# Patient Record
Sex: Male | Born: 1935 | Race: Black or African American | Hispanic: No | Marital: Married | Smoking: Former smoker
Health system: Southern US, Community
[De-identification: ages and names within clinical notes are randomized; demographics above are authoritative.]

## PROBLEM LIST (undated history)

## (undated) DIAGNOSIS — K4020 Bilateral inguinal hernia, without obstruction or gangrene, not specified as recurrent: Secondary | ICD-10-CM

## (undated) DIAGNOSIS — E538 Deficiency of other specified B group vitamins: Secondary | ICD-10-CM

## (undated) DIAGNOSIS — E785 Hyperlipidemia, unspecified: Secondary | ICD-10-CM

## (undated) DIAGNOSIS — I10 Essential (primary) hypertension: Secondary | ICD-10-CM

## (undated) DIAGNOSIS — I639 Cerebral infarction, unspecified: Secondary | ICD-10-CM

## (undated) DIAGNOSIS — R4189 Other symptoms and signs involving cognitive functions and awareness: Secondary | ICD-10-CM

## (undated) DIAGNOSIS — N189 Chronic kidney disease, unspecified: Secondary | ICD-10-CM

## (undated) DIAGNOSIS — E119 Type 2 diabetes mellitus without complications: Secondary | ICD-10-CM

## (undated) DIAGNOSIS — I739 Peripheral vascular disease, unspecified: Secondary | ICD-10-CM

## (undated) HISTORY — DX: Cerebral infarction, unspecified: I63.9

## (undated) HISTORY — DX: Bilateral inguinal hernia, without obstruction or gangrene, not specified as recurrent: K40.20

## (undated) HISTORY — DX: Peripheral vascular disease, unspecified: I73.9

## (undated) HISTORY — DX: Hyperlipidemia, unspecified: E78.5

## (undated) HISTORY — DX: Deficiency of other specified B group vitamins: E53.8

## (undated) HISTORY — DX: Type 2 diabetes mellitus without complications: E11.9

## (undated) HISTORY — DX: Chronic kidney disease, unspecified: N18.9

## (undated) HISTORY — DX: Other symptoms and signs involving cognitive functions and awareness: R41.89

---

## 2001-05-07 ENCOUNTER — Encounter (INDEPENDENT_AMBULATORY_CARE_PROVIDER_SITE_OTHER): Payer: Self-pay | Admitting: *Deleted

## 2001-05-07 ENCOUNTER — Ambulatory Visit (HOSPITAL_COMMUNITY): Admission: RE | Admit: 2001-05-07 | Discharge: 2001-05-07 | Payer: Self-pay | Admitting: Gastroenterology

## 2001-07-02 ENCOUNTER — Ambulatory Visit (HOSPITAL_COMMUNITY): Admission: RE | Admit: 2001-07-02 | Discharge: 2001-07-02 | Payer: Self-pay | Admitting: Gastroenterology

## 2001-07-02 ENCOUNTER — Encounter (INDEPENDENT_AMBULATORY_CARE_PROVIDER_SITE_OTHER): Payer: Self-pay | Admitting: *Deleted

## 2010-07-24 ENCOUNTER — Encounter: Admission: RE | Admit: 2010-07-24 | Discharge: 2010-07-24 | Payer: Self-pay | Admitting: Family Medicine

## 2011-02-09 NOTE — Procedures (Signed)
[place]. [facility]  Patient:    Shane Juarez, Shane Juarez                     MRN: [id number] Proc. Date: 05/07/01 Adm. Date:  [id number] Attending:  Nelda Marseille CC:         Shane Juarez, M.D.   Procedure Report  PROCEDURE:  Colonoscopy.  INDICATION:   Screening.  Consent was signed after risks, benefits, methods and options were thoroughly discussed with Mr. and [name] multiple times in the office.  MEDICATIONS:  Demerol 70, Versed 7.  PROCEDURE:  Rectal inspection was pertinent for external hemorrhoids.  Digital examination was negative.  Video colonoscope was inserted and fairly easily advanced around the colon to the cecum.  On insertion, at 40 cm, a medium-sized pedunculated polyp was seen.  Based on its size and location, we thought we could easily find it on withdrawal.  To advance to the cecum required rolling him on his back and abdominal pressure.  The cecum was identified by the appendiceal orifice and the ileocecal valve.  The prep was adequate.  There was some liquid stool that required washing and suctioning. No other abnormalities were seen on insertion.  The scope was slowly withdrawn.  The cecum and the ascending were normal.  In the mid transverse, a small 5 mm polyp was seen, snared, electrocautery applied and the polyp was suctioned through the scope and collected in the trap.  Just distal to this was a 2 cm sessile polyp.  Four snares were done taking less than 1 cm pieces making sure to note how much wire was in the polyp based on the measurements on the handle.  All pieces were suctioned through the scope and collected in the trap, except for the largest one, which could not be suctioned and was suctioned onto the head of the scope and the piece was recovered.  The scope was reinserted into the polypectomy site, which did require some abdominal pressure.  There was no active bleeding.  There seemed to be a few places  of residual probable adenomatous tissue and a few hot biopsies of the edges were obtained.  There was still probably some residual adenomatous remaining, but based on the degree of cautery applied, we elected to stop further polypectomy at this juncture.  The scope was further withdrawn in the more proximal descending another 5 mm polyp was seen, snared, electrocautery applied, and the polyp was suctioned through the scope and collected in the trap and put in the second container.  The two transverse polyps were put in the same container based on their close proximity.  The scope was slowly withdrawn to the sigmoid/descending junction about 40 cm and the polyp seen on insertion was seen, snare, electrocautery applied, and the polyp was suctioned through the scope and collected in the trap.  This was added to the other descending polyp mentioned above.  The scope was further withdrawn back to the rectum and no additional findings were seen.   Once back in the rectum, the scope was retroflexed, pertinent for some internal hemorrhoids.  Scope was straightened, readvanced a short ways up the sigmoid, air was suctioned, scope removed.  The patient tolerated the procedure well.  There was no obvious immediate complication.  ENDOSCOPIC DIAGNOSES: 1. Internal and external hemorrhoids. 2. Four small to medium-sized polyps, status post removal with the largest    being in the mid-transverse requiring multiple snares and hot  biopsies.    Probably some residual polyp remaining. 3. Otherwise within normal limits to the cecum.  PLAN:  Await pathology, but probable recheck in one year.  If any question about pathology, would check sooner.  Two week customary postpolypectomy instructions.  Will stress those and happy to see back sooner p.r.n. Otherwise return care to Dr. Clarene Duke for customary health care maintenance to including yearly rectals and guaiacs. DD:  05/07/01 TD:  05/07/01 Job:  [id number]

## 2011-02-09 NOTE — Op Note (Signed)
[place]. [facility]  Patient:    Shane Juarez, Shane Juarez Visit Number: [id number] MRN: [id number]          Service Type: END Location: ENDO Attending Physician:  Nelda Marseille Dictated by:   Petra Kuba, M.D. Proc. Date: 07/02/01 Admit Date:  07/02/2001   CC:         Caryn Bee L. Little, M.D.   Operative Report  PROCEDURE:  Colonoscopy, polypectomy.  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  Unsure if completely removed.  Want to repeat procedure to proceed with complete polypectomy if able.  PROCEDURAL NOTE:  Consent was signed after risks, benefits, methods, and options thoroughly discussed with both the patient and his wife in my office.  MEDICINES USED:  Demerol 50, Versed 7.5.  DESCRIPTION OF PROCEDURE:   Rectal inspection performed for external hemorrhoids.  Digital exam was negative.  Video colonoscope was inserted, moderately easily advanced to the cecum.  No obvious abnormality was seen on insertion.  Two inserts to the cecum required.  Rolling him on his back and some abdominal pressure.  Cecum was identified by the appendiceal orifice and ileocecal valve.  The scope was slowly withdrawn.  Prep was adequate.  There was a fair amount of bubbles which required washing with Mylicon wash.  Cecum and ascending were normal.  In the mid transverse in roughly the same position where we had seen the polyp before was probable residual polyp.  It was about 1-1.5 cm but very sessile along a fold.  We went ahead and took five small snare pieces making sure that not too much tissue behind the wall was caught in the snare.  All pieces that were removed were suctioned through the scope and collected in the trap.  We then went ahead and took four biopsies of the edge of the polyp, four hot biopsies of the edge of the polyp.  No obvious residual adenomatous tissue was seen.  Photo documentation was obtained. There was  no active bleeding.  The scope was slowly withdrawn.  No other abnormalities were seen as we slowly withdrew back to the rectum.  Once back in the rectum, the scope was retroflexed ______ for some internal hemorrhoids, small.  The scope was drained.  ______ .  Scope removed.  The patient tolerated the procedure well.  There were no obvious immediate complication.  ENDOSCOPIC DIAGNOSES: 1. Small internal-external hemorrhoids. 2. Mid transverse residual polyp status post multiple snares and hot biopsies. 3. Otherwise within normal limits to the cecum.  PLAN:  Await pathology to determine when to repeat colonoscopy.  Ten-day customary post polypectomy instructions.  Call me sooner p.r.n.  Will decide follow-up based on pathology to see if we need to talk about surgical options or repeating colonoscopy with further work-up and plans. Dictated by:   Petra Kuba, M.D. Attending Physician:  Nelda Marseille DD:  07/02/01 TD:  07/02/01 Job: [phone]

## 2012-01-06 ENCOUNTER — Inpatient Hospital Stay (HOSPITAL_COMMUNITY)
Admission: EM | Admit: 2012-01-06 | Discharge: 2012-01-09 | DRG: 064 | Disposition: A | Discharge status: Rehabilitation Facility | Payer: Medicare Other | Attending: Neurology | Admitting: Neurology

## 2012-01-06 ENCOUNTER — Encounter (HOSPITAL_COMMUNITY): Payer: Self-pay | Admitting: *Deleted

## 2012-01-06 ENCOUNTER — Emergency Department (HOSPITAL_COMMUNITY): Payer: Medicare Other

## 2012-01-06 DIAGNOSIS — I10 Essential (primary) hypertension: Secondary | ICD-10-CM | POA: Diagnosis present

## 2012-01-06 DIAGNOSIS — G8190 Hemiplegia, unspecified affecting unspecified side: Secondary | ICD-10-CM | POA: Diagnosis present

## 2012-01-06 DIAGNOSIS — J9819 Other pulmonary collapse: Secondary | ICD-10-CM | POA: Diagnosis present

## 2012-01-06 DIAGNOSIS — I639 Cerebral infarction, unspecified: Secondary | ICD-10-CM | POA: Diagnosis present

## 2012-01-06 DIAGNOSIS — Z7982 Long term (current) use of aspirin: Secondary | ICD-10-CM

## 2012-01-06 DIAGNOSIS — R1310 Dysphagia, unspecified: Secondary | ICD-10-CM | POA: Diagnosis present

## 2012-01-06 DIAGNOSIS — R51 Headache: Secondary | ICD-10-CM | POA: Diagnosis present

## 2012-01-06 DIAGNOSIS — Z6826 Body mass index (BMI) 26.0-26.9, adult: Secondary | ICD-10-CM

## 2012-01-06 DIAGNOSIS — I63219 Cerebral infarction due to unspecified occlusion or stenosis of unspecified vertebral arteries: Principal | ICD-10-CM | POA: Diagnosis present

## 2012-01-06 DIAGNOSIS — E871 Hypo-osmolality and hyponatremia: Secondary | ICD-10-CM | POA: Diagnosis present

## 2012-01-06 DIAGNOSIS — R7309 Other abnormal glucose: Secondary | ICD-10-CM | POA: Diagnosis present

## 2012-01-06 DIAGNOSIS — R471 Dysarthria and anarthria: Secondary | ICD-10-CM | POA: Diagnosis present

## 2012-01-06 DIAGNOSIS — R279 Unspecified lack of coordination: Secondary | ICD-10-CM | POA: Diagnosis present

## 2012-01-06 DIAGNOSIS — G936 Cerebral edema: Secondary | ICD-10-CM | POA: Diagnosis present

## 2012-01-06 HISTORY — DX: Essential (primary) hypertension: I10

## 2012-01-06 LAB — COMPREHENSIVE METABOLIC PANEL
ALT: 13 U/L (ref 0–53)
AST: 25 U/L (ref 0–37)
Albumin: 3.6 g/dL (ref 3.5–5.2)
Alkaline Phosphatase: 89 U/L (ref 39–117)
BUN: 14 mg/dL (ref 6–23)
CO2: 25 mEq/L (ref 19–32)
Calcium: 9.1 mg/dL (ref 8.4–10.5)
Chloride: 99 mEq/L (ref 96–112)
Creatinine, Ser: 1.18 mg/dL (ref 0.50–1.35)
GFR calc Af Amer: 65 mL/min — ABNORMAL LOW (ref >90)
GFR calc non Af Amer: 56 mL/min — ABNORMAL LOW (ref >90)
Glucose, Bld: 158 mg/dL — ABNORMAL HIGH (ref 70–99)
Potassium: 4.3 mEq/L (ref 3.5–5.1)
Sodium: 133 mEq/L — ABNORMAL LOW (ref 135–145)
Total Bilirubin: 0.6 mg/dL (ref 0.3–1.2)
Total Protein: 7.5 g/dL (ref 6.0–8.3)

## 2012-01-06 LAB — URINALYSIS, ROUTINE W REFLEX MICROSCOPIC
Bilirubin Urine: NEGATIVE
Glucose, UA: 100 mg/dL — AB
Hgb urine dipstick: NEGATIVE
Ketones, ur: 15 mg/dL — AB
Leukocytes, UA: NEGATIVE
Nitrite: NEGATIVE
Protein, ur: NEGATIVE mg/dL
Specific Gravity, Urine: 1.022 (ref 1.005–1.030)
Urobilinogen, UA: 0.2 mg/dL (ref 0.0–1.0)
pH: 6 (ref 5.0–8.0)

## 2012-01-06 LAB — DIFFERENTIAL
Basophils Absolute: 0 10*3/uL (ref 0.0–0.1)
Basophils Relative: 0 % (ref 0–1)
Eosinophils Absolute: 0 10*3/uL (ref 0.0–0.7)
Eosinophils Relative: 0 % (ref 0–5)
Lymphocytes Relative: 20 % (ref 12–46)
Lymphs Abs: 1.8 10*3/uL (ref 0.7–4.0)
Monocytes Absolute: 0.8 10*3/uL (ref 0.1–1.0)
Monocytes Relative: 9 % (ref 3–12)
Neutro Abs: 6.5 10*3/uL (ref 1.7–7.7)
Neutrophils Relative %: 71 % (ref 43–77)

## 2012-01-06 LAB — CBC
HCT: 39.5 % (ref 39.0–52.0)
Hemoglobin: 13.9 g/dL (ref 13.0–17.0)
MCH: 33.2 pg (ref 26.0–34.0)
MCHC: 35.2 g/dL (ref 30.0–36.0)
MCV: 94.3 fL (ref 78.0–100.0)
Platelets: 232 10*3/uL (ref 150–400)
RBC: 4.19 MIL/uL — ABNORMAL LOW (ref 4.22–5.81)
RDW: 13 % (ref 11.5–15.5)
WBC: 9.1 10*3/uL (ref 4.0–10.5)

## 2012-01-06 LAB — GLUCOSE, CAPILLARY: Glucose-Capillary: 129 mg/dL — ABNORMAL HIGH (ref 70–99)

## 2012-01-06 LAB — PROTIME-INR
INR: 0.98 (ref 0.00–1.49)
Prothrombin Time: 13.2 seconds (ref 11.6–15.2)

## 2012-01-06 LAB — MRSA PCR SCREENING: MRSA by PCR: NEGATIVE

## 2012-01-06 LAB — APTT: aPTT: 26 seconds (ref 24–37)

## 2012-01-06 MED ORDER — SODIUM CHLORIDE 0.9 % IV SOLN: INTRAVENOUS | Status: DC

## 2012-01-06 MED ORDER — ONDANSETRON HCL 4 MG/2ML IJ SOLN: 4.0000 mg | Freq: Four times a day (QID) | INTRAMUSCULAR | Status: DC | PRN

## 2012-01-06 MED ORDER — SODIUM CHLORIDE 0.9 % IV SOLN
INTRAVENOUS | Status: DC
  Administered 2012-01-06: 13:00:00 via INTRAVENOUS

## 2012-01-06 MED ORDER — SODIUM CHLORIDE 0.9 % IV SOLN
INTRAVENOUS | Status: DC
  Administered 2012-01-06: 20:00:00 via INTRAVENOUS

## 2012-01-06 MED ORDER — ASPIRIN 325 MG PO TABS
325.0000 mg | ORAL_TABLET | Freq: Every day | ORAL | Status: DC
  Administered 2012-01-06 – 2012-01-09 (×3): 325 mg via ORAL
  Filled 2012-01-06 (×4): qty 1

## 2012-01-06 MED ORDER — ASPIRIN 81 MG PO CHEW
324.0000 mg | CHEWABLE_TABLET | Freq: Once | ORAL | Status: AC
  Administered 2012-01-06: 324 mg via ORAL
  Filled 2012-01-06: qty 4

## 2012-01-06 MED ORDER — ASPIRIN 300 MG RE SUPP
300.0000 mg | Freq: Every day | RECTAL | Status: DC
  Administered 2012-01-07: 300 mg via RECTAL
  Filled 2012-01-06 (×3): qty 1

## 2012-01-06 MED ORDER — SENNOSIDES-DOCUSATE SODIUM 8.6-50 MG PO TABS
1.0000 | ORAL_TABLET | Freq: Every evening | ORAL | Status: DC | PRN
  Filled 2012-01-06: qty 1

## 2012-01-06 MED ORDER — LABETALOL HCL 5 MG/ML IV SOLN
20.0000 mg | INTRAVENOUS | Status: DC | PRN
  Administered 2012-01-08: 20 mg via INTRAVENOUS
  Filled 2012-01-06 (×2): qty 4

## 2012-01-06 MED ORDER — PANTOPRAZOLE SODIUM 40 MG IV SOLR
40.0000 mg | Freq: Every day | INTRAVENOUS | Status: DC
  Administered 2012-01-06: 40 mg via INTRAVENOUS
  Filled 2012-01-06 (×2): qty 40

## 2012-01-06 NOTE — Consult Note (Signed)
Name: Shane Juarez MRN: [id number] DOB: 11-16-1929    LOS: 0  PCCM ADMISSION NOTE  History of Present Illness: This is an 78 year old male, with no significant past medical history, other than colon polyps, s/p colonoscopies/extraction 04/2001 and 06/2001, HTN, presenting as described above. History is obtained from patient's spouse, who was present in the ED. Acute onset 3 days prior of falling. Pt now with ataxia. 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  Due to size of CVA pt will need ICU/SDU bed.    Lines / Drains: none  Cultures: none  Antibiotics: none  Tests / Events:     Past Medical History  Diagnosis Date  . Hypertension    History reviewed. No pertinent past surgical history. Prior to Admission medications   Medication Sig Start Date End Date Taking? Authorizing Provider  amLODipine (NORVASC) 5 MG tablet Take 5 mg by mouth daily.   Yes Historical Provider, MD  Probiotic Product (PROBIOTIC FORMULA PO) Take 1 tablet by mouth daily.   Yes Historical Provider, MD   Allergies No Known Allergies  Family History No family history on file.  Social History  reports that he has never smoked. He does not have any smokeless tobacco history on file. He reports that he does not drink alcohol or use illicit drugs.  Review Of Systems  11 points review of systems is negative with an exception of listed in HPI.  Vital Signs: Temp:  [98.2 F (36.8 C)-98.6 F (37 C)] 98.2 F (36.8 C) (04/14 1619) Pulse Rate:  [84-102] 102  (04/14 1615) Resp:  [18-100] 100  (04/14 1548) BP: (139-169)/(68-84) 169/84 mmHg (04/14 1615) SpO2:  [95 %-100 %] 100 % (04/14 1615)    Physical Examination: General:  Awake alert Neuro:  Moves all 4s.  ataxic   HEENT:  Dry mucus membranes Neck:  Supple    Cardiovascular:  RRR nl s1/s2  No s3/s4 Lungs:  clear Abdomen:  Soft NT BSA Musculoskeletal:  From  Skin:  clear  Ventilator settings: On RA    Labs and Imaging:  Reviewed.   Please refer to the Assessment and Plan section for relevant results. Dg Chest 2 View  01/06/2012  *RADIOLOGY REPORT*  Clinical Data: Fall with weakness.  CHEST - 2 VIEW  Comparison: 07/24/2010 CT  Findings: This is a low volume film with mild bibasilar atelectasis. There is no evidence of focal airspace disease, pulmonary edema, suspicious pulmonary nodule/mass, pleural effusion, or pneumothorax. No acute bony abnormalities are identified.  IMPRESSION: Low volume film with mild basilar atelectasis.  Original Report Authenticated By: Rosendo Gros, M.D.   Ct Head Wo Contrast  01/06/2012  *RADIOLOGY REPORT*  Clinical Data:  Fall, unsteady gait  CT HEAD WITHOUT CONTRAST CT CERVICAL SPINE WITHOUT CONTRAST  Technique:  Multidetector CT imaging of the head and cervical spine was performed following the standard protocol without intravenous contrast.  Multiplanar CT image reconstructions of the cervical spine were also generated.  Comparison: None  CT HEAD  Findings: There is a large region of hypoattenuation within the right cerebellum most consistent with infarction.  There is some mass effect with compression of the fourth ventricle suggesting edema. There is mild left or right midline shift the posterior fossa.  Quadrigeminal plate cistern is patent as well as the suprasellar cistern.  There is mild dilatation of the temporal horns.  No evidence of supra tentorial infarction.  There is periventricular white matter hypodensities.  No acute intracranial hemorrhage.  IMPRESSION:  1. Findings most consistent with large right cerebellar infarction which is acute / subacute.  2.  Mass effect in the posterior fossa with compression of the fourth ventricle and midline shift within the posterior fossa.  CT CERVICAL SPINE  Findings: No prevertebral soft tissue swelling.  Normal alignment of cervical vertebral bodies.  No loss of vertebral body height. Normal facet articulation.  Normal craniocervical junction. There is  endplate spurring at multiple levels and loss of disc space at C5-C6.  No evidence epidural or paraspinal hematoma.  Incidental note of a 15 mm right thyroid nodule.  IMPRESSION: 1.  No evidence of cervical spine fracture. 2.  Multilevel spondylosis. 3.  Thyroid nodule on the right. Consider further evaluation with thyroid ultrasound.  If patient is clinically hyperthyroid, consider nuclear medicine thyroid uptake and scan.  Findings conveyed to Dr. Rubin Payor on 01/06/2012 at 1220 hours  Original Report Authenticated By: Genevive Bi, M.D.   Ct Cervical Spine Wo Contrast  01/06/2012  *RADIOLOGY REPORT*  Clinical Data:  Fall, unsteady gait  CT HEAD WITHOUT CONTRAST CT CERVICAL SPINE WITHOUT CONTRAST  Technique:  Multidetector CT imaging of the head and cervical spine was performed following the standard protocol without intravenous contrast.  Multiplanar CT image reconstructions of the cervical spine were also generated.  Comparison: None  CT HEAD  Findings: There is a large region of hypoattenuation within the right cerebellum most consistent with infarction.  There is some mass effect with compression of the fourth ventricle suggesting edema. There is mild left or right midline shift the posterior fossa.  Quadrigeminal plate cistern is patent as well as the suprasellar cistern.  There is mild dilatation of the temporal horns.  No evidence of supra tentorial infarction.  There is periventricular white matter hypodensities.  No acute intracranial hemorrhage.  IMPRESSION:  1. Findings most consistent with large right cerebellar infarction which is acute / subacute.  2.  Mass effect in the posterior fossa with compression of the fourth ventricle and midline shift within the posterior fossa.  CT CERVICAL SPINE  Findings: No prevertebral soft tissue swelling.  Normal alignment of cervical vertebral bodies.  No loss of vertebral body height. Normal facet articulation.  Normal craniocervical junction. There is  endplate spurring at multiple levels and loss of disc space at C5-C6.  No evidence epidural or paraspinal hematoma.  Incidental note of a 15 mm right thyroid nodule.  IMPRESSION: 1.  No evidence of cervical spine fracture. 2.  Multilevel spondylosis. 3.  Thyroid nodule on the right. Consider further evaluation with thyroid ultrasound.  If patient is clinically hyperthyroid, consider nuclear medicine thyroid uptake and scan.  Findings conveyed to Dr. Rubin Payor on 01/06/2012 at 1220 hours  Original Report Authenticated By: Genevive Bi, M.D.    Lab 01/06/12 1215  NA 133*  K 4.3  CL 99  CO2 25  GLUCOSE 158*  BUN 14  CREATININE 1.18  CALCIUM 9.1  MG --  PHOS --    Lab 01/06/12 1215  HGB 13.9  HCT 39.5  WBC 9.1  PLT 232    Lab 01/06/12 1529 01/06/12 1215  AST -- 25  ALT -- 13  ALKPHOS -- 89  BILITOT -- 0.6  PROT -- 7.5  ALBUMIN -- 3.6  INR 0.98 --    Assessment and Plan: Patient Active Hospital Problem List: CVA (cerebral vascular accident) (01/06/2012)   Assessment: cerebellar CVA   Plan: admit ICU to PCCM, see stroke orders, frequent neuro checks , high risk  for 4th ventric obstruction and herniation , needs close observation  HTN (hypertension) (01/06/2012)   Assessment: monitor and treat ,     Plan: Keep SBP <170    Best practices / Disposition: -->ICU status under PCCM -->full code -->SCD  for DVT Px -->Protonix for GI Px -->diet npo -->family updated at bedside  The patient is critically ill with multiple organ systems failure and requires high complexity decision making for assessment and support, frequent evaluation and titration of therapies, application of advanced monitoring technologies and extensive interpretation of multiple databases. Critical Care Time devoted to patient care services described in this note is 35 minutes.  Shan Levans  M.D. Pulmonary and Critical Care Medicine [facility] Cell: [phone]   Pager:  [phone] 01/06/2012, 5:49 PM

## 2012-01-06 NOTE — ED Notes (Signed)
Pt and wife revealed to the admitting doctor that he has been eating food from home today without informing nursing staff and wanted to continue to eat food. This RN talked with Dr Brien Few about pt having to stay npo until speech pathology study completed.

## 2012-01-06 NOTE — ED Notes (Signed)
Dr Delford Field is here to assess the pt for admission to 3100.

## 2012-01-06 NOTE — ED Notes (Signed)
Pt requested and was provided a urinal. 

## 2012-01-06 NOTE — Consult Note (Signed)
Chief Complaint: "cannot walk"  HPI: Shane Juarez is an 78 y.o. male who was unable to walk since Wednesday. Denies headaches or nausea. Also had difficulty with using his right arm. NIHSS of 2.  LSN: Wednesday tPA Given: No: out of window mRankin: 0  Past Medical History  Diagnosis Date  . Hypertension    History reviewed. No pertinent past surgical history.  No family history on file. Social History:  reports that he has never smoked. He does not have any smokeless tobacco history on file. He reports that he does not drink alcohol or use illicit drugs.  Allergies: No Known Allergies  Medications: I have reviewed the patient's current medications.  ROS: as above  Physical Examination: Blood pressure 164/73, pulse 84, temperature 98.4 F (36.9 C), temperature source Oral, resp. rate 18, SpO2 100.00%.  Neurologic Examination: Neurological exam: AAO*3. No aphasia. Followed complex commands. Cranial nerves: EOMI, PERRL. Visual fields were full. Sensation to V1 through V3 areas of the face was intact and symmetric throughout. There was no facial asymmetry. Hearing to finger rub was equal and symmetrical bilaterally. Shoulder shrug was 5/5 and symmetric bilaterally. Head rotation was 5/5 bilaterally. There was no dysarthria or palatal deviation. Motor: strength was 5/5 and symmetric throughout. Sensory: was intact throughout to light touch, pinprick, vibration and proprioception. Coordination: finger-to-nose and heel-to-shin were abnormal on the right. Reflexes: were 1+ in upper extremities and 0 at the knees and 0 at the ankles. Plantar response was mute bilaterally. Gait: deferred.  Results for orders placed during the hospital encounter of 01/06/12 (from the past 48 hour(s))  CBC     Status: Abnormal   Collection Time   01/06/12 12:15 PM      Component Value Range Comment   WBC 9.1  4.0 - 10.5 (K/uL)    RBC 4.19 (*) 4.22 - 5.81 (MIL/uL)    Hemoglobin 13.9  13.0 - 17.0 (g/dL)    HCT 16.1  09.6 - 04.5 (%)    MCV 94.3  78.0 - 100.0 (fL)    MCH 33.2  26.0 - 34.0 (pg)    MCHC 35.2  30.0 - 36.0 (g/dL)    RDW 40.9  81.1 - 91.4 (%)    Platelets 232  150 - 400 (K/uL)   DIFFERENTIAL     Status: Normal   Collection Time   01/06/12 12:15 PM      Component Value Range Comment   Neutrophils Relative 71  43 - 77 (%)    Neutro Abs 6.5  1.7 - 7.7 (K/uL)    Lymphocytes Relative 20  12 - 46 (%)    Lymphs Abs 1.8  0.7 - 4.0 (K/uL)    Monocytes Relative 9  3 - 12 (%)    Monocytes Absolute 0.8  0.1 - 1.0 (K/uL)    Eosinophils Relative 0  0 - 5 (%)    Eosinophils Absolute 0.0  0.0 - 0.7 (K/uL)    Basophils Relative 0  0 - 1 (%)    Basophils Absolute 0.0  0.0 - 0.1 (K/uL)   COMPREHENSIVE METABOLIC PANEL     Status: Abnormal   Collection Time   01/06/12 12:15 PM      Component Value Range Comment   Sodium 133 (*) 135 - 145 (mEq/L)    Potassium 4.3  3.5 - 5.1 (mEq/L)    Chloride 99  96 - 112 (mEq/L)    CO2 25  19 - 32 (mEq/L)    Glucose, Bld 158 (*)  70 - 99 (mg/dL)    BUN 14  6 - 23 (mg/dL)    Creatinine, Ser 1.61  0.50 - 1.35 (mg/dL)    Calcium 9.1  8.4 - 10.5 (mg/dL)    Total Protein 7.5  6.0 - 8.3 (g/dL)    Albumin 3.6  3.5 - 5.2 (g/dL)    AST 25  0 - 37 (U/L) HEMOLYZED SPECIMEN, RESULTS MAY BE AFFECTED   ALT 13  0 - 53 (U/L)    Alkaline Phosphatase 89  39 - 117 (U/L)    Total Bilirubin 0.6  0.3 - 1.2 (mg/dL)    GFR calc non Af Amer 56 (*) >90 (mL/min)    GFR calc Af Amer 65 (*) >90 (mL/min)   URINALYSIS, ROUTINE W REFLEX MICROSCOPIC     Status: Abnormal   Collection Time   01/06/12  1:35 PM      Component Value Range Comment   Color, Urine YELLOW  YELLOW     APPearance CLEAR  CLEAR     Specific Gravity, Urine 1.022  1.005 - 1.030     pH 6.0  5.0 - 8.0     Glucose, UA 100 (*) NEGATIVE (mg/dL)    Hgb urine dipstick NEGATIVE  NEGATIVE     Bilirubin Urine NEGATIVE  NEGATIVE     Ketones, ur 15 (*) NEGATIVE (mg/dL)    Protein, ur NEGATIVE  NEGATIVE (mg/dL)     Urobilinogen, UA 0.2  0.0 - 1.0 (mg/dL)    Nitrite NEGATIVE  NEGATIVE     Leukocytes, UA NEGATIVE  NEGATIVE  MICROSCOPIC NOT DONE ON URINES WITH NEGATIVE PROTEIN, BLOOD, LEUKOCYTES, NITRITE, OR GLUCOSE <1000 mg/dL.   Dg Chest 2 View  01/06/2012  *RADIOLOGY REPORT*  Clinical Data: Fall with weakness.  CHEST - 2 VIEW  Comparison: 07/24/2010 CT  Findings: This is a low volume film with mild bibasilar atelectasis. There is no evidence of focal airspace disease, pulmonary edema, suspicious pulmonary nodule/mass, pleural effusion, or pneumothorax. No acute bony abnormalities are identified.  IMPRESSION: Low volume film with mild basilar atelectasis.  Original Report Authenticated By: Rosendo Gros, M.D.   Ct Head Wo Contrast  01/06/2012  *RADIOLOGY REPORT*  Clinical Data:  Fall, unsteady gait  CT HEAD WITHOUT CONTRAST CT CERVICAL SPINE WITHOUT CONTRAST  Technique:  Multidetector CT imaging of the head and cervical spine was performed following the standard protocol without intravenous contrast.  Multiplanar CT image reconstructions of the cervical spine were also generated.  Comparison: None  CT HEAD  Findings: There is a large region of hypoattenuation within the right cerebellum most consistent with infarction.  There is some mass effect with compression of the fourth ventricle suggesting edema. There is mild left or right midline shift the posterior fossa.  Quadrigeminal plate cistern is patent as well as the suprasellar cistern.  There is mild dilatation of the temporal horns.  No evidence of supra tentorial infarction.  There is periventricular white matter hypodensities.  No acute intracranial hemorrhage.  IMPRESSION:  1. Findings most consistent with large right cerebellar infarction which is acute / subacute.  2.  Mass effect in the posterior fossa with compression of the fourth ventricle and midline shift within the posterior fossa.  CT CERVICAL SPINE  Findings: No prevertebral soft tissue swelling.   Normal alignment of cervical vertebral bodies.  No loss of vertebral body height. Normal facet articulation.  Normal craniocervical junction. There is endplate spurring at multiple levels and loss of disc space at C5-C6.  No evidence epidural or paraspinal hematoma.  Incidental note of a 15 mm right thyroid nodule.  IMPRESSION: 1.  No evidence of cervical spine fracture. 2.  Multilevel spondylosis. 3.  Thyroid nodule on the right. Consider further evaluation with thyroid ultrasound.  If patient is clinically hyperthyroid, consider nuclear medicine thyroid uptake and scan.  Findings conveyed to Dr. Rubin Payor on 01/06/2012 at 1220 hours  Original Report Authenticated By: Genevive Bi, M.D.   Ct Cervical Spine Wo Contrast  01/06/2012  *RADIOLOGY REPORT*  Clinical Data:  Fall, unsteady gait  CT HEAD WITHOUT CONTRAST CT CERVICAL SPINE WITHOUT CONTRAST  Technique:  Multidetector CT imaging of the head and cervical spine was performed following the standard protocol without intravenous contrast.  Multiplanar CT image reconstructions of the cervical spine were also generated.  Comparison: None  CT HEAD  Findings: There is a large region of hypoattenuation within the right cerebellum most consistent with infarction.  There is some mass effect with compression of the fourth ventricle suggesting edema. There is mild left or right midline shift the posterior fossa.  Quadrigeminal plate cistern is patent as well as the suprasellar cistern.  There is mild dilatation of the temporal horns.  No evidence of supra tentorial infarction.  There is periventricular white matter hypodensities.  No acute intracranial hemorrhage.  IMPRESSION:  1. Findings most consistent with large right cerebellar infarction which is acute / subacute.  2.  Mass effect in the posterior fossa with compression of the fourth ventricle and midline shift within the posterior fossa.  CT CERVICAL SPINE  Findings: No prevertebral soft tissue swelling.  Normal  alignment of cervical vertebral bodies.  No loss of vertebral body height. Normal facet articulation.  Normal craniocervical junction. There is endplate spurring at multiple levels and loss of disc space at C5-C6.  No evidence epidural or paraspinal hematoma.  Incidental note of a 15 mm right thyroid nodule.  IMPRESSION: 1.  No evidence of cervical spine fracture. 2.  Multilevel spondylosis. 3.  Thyroid nodule on the right. Consider further evaluation with thyroid ultrasound.  If patient is clinically hyperthyroid, consider nuclear medicine thyroid uptake and scan.  Findings conveyed to Dr. Rubin Payor on 01/06/2012 at 1220 hours  Original Report Authenticated By: Genevive Bi, M.D.   Assessment: 78 y.o. male with large right cerebellar stroke and inability to walk since Wednesday as well as difficulty with using his right arm  Stroke Risk Factors - hypertension  Plan: 1. HgbA1c, fasting lipid panel 2. MRI, MRA  of the brain without contrast 3. PT consult, OT consult, Speech consult 4. Echocardiogram 5. Carotid dopplers 6. Prophylactic therapy-Antiplatelet med: Aspirin - dose 325 mg PO daily 7. Risk factor modification 8. Cardiac monitoring 9. Neurochecks q4h - with any sign of new headache, nausea or confusion. Please get stat CT head and call us 10. Can control his blood pressure appropriatelly as it's been 4 days since his CVA  Kaven Cumbie 01/06/2012, 2:32 PM

## 2012-01-06 NOTE — ED Notes (Signed)
Pt has bed on 3000 but doesn't have orders. Talked to [facility], [place] about getting some temp orders.

## 2012-01-06 NOTE — ED Notes (Signed)
Charge Nurse on 3100 reported to me that Dr Brien Few must get permission from PCCM or elink to admit the pt to 3100. Dr Brien Few informed by phone.

## 2012-01-06 NOTE — ED Notes (Signed)
Marylene Land, RN on 3000 not available for report at present, is with another pt. Will call her back in 5 minutes.

## 2012-01-06 NOTE — ED Provider Notes (Signed)
History     CSN: [id number]  Arrival date & time 01/06/12  1055   First MD Initiated Contact with Patient 01/06/12 1130      Chief Complaint  Patient presents with  . Fall  . Weakness    (Consider location/radiation/quality/duration/timing/severity/associated sxs/prior treatment) HPI  Past Medical History  Diagnosis Date  . Hypertension     History reviewed. No pertinent past surgical history.  No family history on file.  History  Substance Use Topics  . Smoking status: Never Smoker   . Smokeless tobacco: Not on file  . Alcohol Use: No      Review of Systems  Allergies  Review of patient's allergies indicates no known allergies.  Home Medications   Current Outpatient Rx  Name Route Sig Dispense Refill  . AMLODIPINE BESYLATE 5 MG PO TABS Oral Take 5 mg by mouth daily.    Marland Kitchen PROBIOTIC FORMULA PO Oral Take 1 tablet by mouth daily.      BP 164/73  Pulse 84  Temp(Src) 98.4 F (36.9 C) (Oral)  Resp 18  SpO2 100%  Physical Exam  ED Course  Procedures (including critical care time)  Labs Reviewed  CBC - Abnormal; Notable for the following:    RBC 4.19 (*)    All other components within normal limits  COMPREHENSIVE METABOLIC PANEL - Abnormal; Notable for the following:    Sodium 133 (*)    Glucose, Bld 158 (*)    GFR calc non Af Amer 56 (*)    GFR calc Af Amer 65 (*)    All other components within normal limits  DIFFERENTIAL  URINALYSIS, ROUTINE W REFLEX MICROSCOPIC   Dg Chest 2 View  01/06/2012  *RADIOLOGY REPORT*  Clinical Data: Fall with weakness.  CHEST - 2 VIEW  Comparison: 07/24/2010 CT  Findings: This is a low volume film with mild bibasilar atelectasis. There is no evidence of focal airspace disease, pulmonary edema, suspicious pulmonary nodule/mass, pleural effusion, or pneumothorax. No acute bony abnormalities are identified.  IMPRESSION: Low volume film with mild basilar atelectasis.  Original Report Authenticated By: Rosendo Gros, M.D.    Ct Head Wo Contrast  01/06/2012  *RADIOLOGY REPORT*  Clinical Data:  Fall, unsteady gait  CT HEAD WITHOUT CONTRAST CT CERVICAL SPINE WITHOUT CONTRAST  Technique:  Multidetector CT imaging of the head and cervical spine was performed following the standard protocol without intravenous contrast.  Multiplanar CT image reconstructions of the cervical spine were also generated.  Comparison: None  CT HEAD  Findings: There is a large region of hypoattenuation within the right cerebellum most consistent with infarction.  There is some mass effect with compression of the fourth ventricle suggesting edema. There is mild left or right midline shift the posterior fossa.  Quadrigeminal plate cistern is patent as well as the suprasellar cistern.  There is mild dilatation of the temporal horns.  No evidence of supra tentorial infarction.  There is periventricular white matter hypodensities.  No acute intracranial hemorrhage.  IMPRESSION:  1. Findings most consistent with large right cerebellar infarction which is acute / subacute.  2.  Mass effect in the posterior fossa with compression of the fourth ventricle and midline shift within the posterior fossa.  CT CERVICAL SPINE  Findings: No prevertebral soft tissue swelling.  Normal alignment of cervical vertebral bodies.  No loss of vertebral body height. Normal facet articulation.  Normal craniocervical junction. There is endplate spurring at multiple levels and loss of disc space at C5-C6.  No  evidence epidural or paraspinal hematoma.  Incidental note of a 15 mm right thyroid nodule.  IMPRESSION: 1.  No evidence of cervical spine fracture. 2.  Multilevel spondylosis. 3.  Thyroid nodule on the right. Consider further evaluation with thyroid ultrasound.  If patient is clinically hyperthyroid, consider nuclear medicine thyroid uptake and scan.  Findings conveyed to Dr. Rubin Payor on 01/06/2012 at 1220 hours  Original Report Authenticated By: Genevive Bi, M.D.   Ct Cervical  Spine Wo Contrast  01/06/2012  *RADIOLOGY REPORT*  Clinical Data:  Fall, unsteady gait  CT HEAD WITHOUT CONTRAST CT CERVICAL SPINE WITHOUT CONTRAST  Technique:  Multidetector CT imaging of the head and cervical spine was performed following the standard protocol without intravenous contrast.  Multiplanar CT image reconstructions of the cervical spine were also generated.  Comparison: None  CT HEAD  Findings: There is a large region of hypoattenuation within the right cerebellum most consistent with infarction.  There is some mass effect with compression of the fourth ventricle suggesting edema. There is mild left or right midline shift the posterior fossa.  Quadrigeminal plate cistern is patent as well as the suprasellar cistern.  There is mild dilatation of the temporal horns.  No evidence of supra tentorial infarction.  There is periventricular white matter hypodensities.  No acute intracranial hemorrhage.  IMPRESSION:  1. Findings most consistent with large right cerebellar infarction which is acute / subacute.  2.  Mass effect in the posterior fossa with compression of the fourth ventricle and midline shift within the posterior fossa.  CT CERVICAL SPINE  Findings: No prevertebral soft tissue swelling.  Normal alignment of cervical vertebral bodies.  No loss of vertebral body height. Normal facet articulation.  Normal craniocervical junction. There is endplate spurring at multiple levels and loss of disc space at C5-C6.  No evidence epidural or paraspinal hematoma.  Incidental note of a 15 mm right thyroid nodule.  IMPRESSION: 1.  No evidence of cervical spine fracture. 2.  Multilevel spondylosis. 3.  Thyroid nodule on the right. Consider further evaluation with thyroid ultrasound.  If patient is clinically hyperthyroid, consider nuclear medicine thyroid uptake and scan.  Findings conveyed to Dr. Rubin Payor on 01/06/2012 at 1220 hours  Original Report Authenticated By: Genevive Bi, M.D.     1. Stroke     1:26 PM Patient seen and examined. Patient to CDU from stretcher triage, dx cerebellar stroke. Symptoms for 4 days. Neurology is to see patient. Dr. Lyman Speller called by Dr. Rubin Payor.  Vital signs reviewed and are as follows: Filed Vitals:   01/06/12 1321  BP: 164/73  Pulse: 84  Temp:   Resp: 18   2:21 PM Dr. Lyman Speller has seen patient. Recomends medical admit. Will call [facility].    3:03 PM [facility] Team 6.   MDM  Admit for cerebellar stroke       Renne Crigler, [place] 01/06/12 1504

## 2012-01-06 NOTE — ED Notes (Signed)
Dr Brien Few at bedside to see pt and talk with family

## 2012-01-06 NOTE — H&P (Signed)
PCP:   No primary provider on file.   Chief Complaint:  Difficulty in walking, unsteadiness and incoordination. Recent fall.  HPI: This is an 78 year old male, with no significant past medical history, other than colon polyps, s/p colonoscopies/extraction 04/2001 and 06/2001, HTN, presenting as described above. History is obtained from patient's spouse, who was present in the ED. According to her, he went to sleep on the tail gate of a stationary truck on 12/31/11, ad rolled off, falling to the ground. It appears that he suffered no obvious injuries, got up, and was fine. A day later, he developed nausea, vomiting and diarrhea, went to his PMD's office, and was treated for an acute G/Enteritis with an injection of Phenergan, and was prescribed Phenergan pills and a probiotic. He has had no GI symptoms since. Since 01/02/12, he has experienced difficulty with ambulation, associated with unsteadiness, unable to get up without assistance. "Objects appear to move", when he reaches out to steady himself. He has had no swallowing difficulties or slurred speech, and he admits that his limbs seem uncoordinated. As he showed no improvement over the next few days, his family brought him to the ED today.  Allergies:  No Known Allergies    Past Medical History  Diagnosis Date  . Hypertension     History reviewed. No pertinent past surgical history.  Prior to Admission medications   Medication Sig Start Date End Date Taking? Authorizing Provider  amLODipine (NORVASC) 5 MG tablet Take 5 mg by mouth daily.   Yes Historical Provider, MD  Probiotic Product (PROBIOTIC FORMULA PO) Take 1 tablet by mouth daily.   Yes Historical Provider, MD    Social History: Patient is married with offspring, reports that he has never smoked. He does not have any smokeless tobacco history on file. He reports that he does not drink alcohol or use illicit drugs.  No family history on file.  Review of Systems:  As per HPI and  chief complaint. Patent denies fatigue, diminished appetite, weight loss, fever, chills, headache, blurred vision, difficulty in speaking, dysphagia, chest pain, cough, shortness of breath, orthopnea, paroxysmal nocturnal dyspnea, nausea, abdominal pain, hematemesis, melena, dysuria, nocturia, urinary frequency, hematochezia, lower extremity swelling, pain, or redness. The rest of the systems review is negative.  Physical Exam:  General:  Patient does not appear to be in obvious acute distress. Alert, communicative, fully oriented, talking in complete sentences, not short of breath at rest.  HEENT:  No clinical pallor, no jaundice, no conjunctival injection or discharge. Hydration status appears fair. NECK:  Supple, JVP not seen, no carotid bruits, no palpable lymphadenopathy, no palpable goiter. CHEST:  Clinically clear to auscultation, no wheezes, no crackles. HEART:  Sounds 1 and 2 heard, normal, regular, no murmurs. ABDOMEN:  Full, soft, non-tender, no palpable organomegaly, no palpable masses, normal bowel sounds. GENITALIA:  Not examined. LOWER EXTREMITIES:  No pitting edema, palpable peripheral pulses. MUSCULOSKELETAL SYSTEM:  Generalized osteoarthritic changes, otherwise, normal. CENTRAL NERVOUS SYSTEM:  No facial asymmetry or obvious cranial nerve deficit. Alternating rapid motion is grossly impaired in right hand, as is Finger-To-Nose testing. Heel-Shin test is also impaired on right.  Labs on Admission:  Results for orders placed during the hospital encounter of 01/06/12 (from the past 48 hour(s))  CBC     Status: Abnormal   Collection Time   01/06/12 12:15 PM      Component Value Range Comment   WBC 9.1  4.0 - 10.5 (K/uL)    RBC 4.19 (*)  4.22 - 5.81 (MIL/uL)    Hemoglobin 13.9  13.0 - 17.0 (g/dL)    HCT 62.9  52.8 - 41.3 (%)    MCV 94.3  78.0 - 100.0 (fL)    MCH 33.2  26.0 - 34.0 (pg)    MCHC 35.2  30.0 - 36.0 (g/dL)    RDW 24.4  01.0 - 27.2 (%)    Platelets 232  150 - 400  (K/uL)   DIFFERENTIAL     Status: Normal   Collection Time   01/06/12 12:15 PM      Component Value Range Comment   Neutrophils Relative 71  43 - 77 (%)    Neutro Abs 6.5  1.7 - 7.7 (K/uL)    Lymphocytes Relative 20  12 - 46 (%)    Lymphs Abs 1.8  0.7 - 4.0 (K/uL)    Monocytes Relative 9  3 - 12 (%)    Monocytes Absolute 0.8  0.1 - 1.0 (K/uL)    Eosinophils Relative 0  0 - 5 (%)    Eosinophils Absolute 0.0  0.0 - 0.7 (K/uL)    Basophils Relative 0  0 - 1 (%)    Basophils Absolute 0.0  0.0 - 0.1 (K/uL)   COMPREHENSIVE METABOLIC PANEL     Status: Abnormal   Collection Time   01/06/12 12:15 PM      Component Value Range Comment   Sodium 133 (*) 135 - 145 (mEq/L)    Potassium 4.3  3.5 - 5.1 (mEq/L)    Chloride 99  96 - 112 (mEq/L)    CO2 25  19 - 32 (mEq/L)    Glucose, Bld 158 (*) 70 - 99 (mg/dL)    BUN 14  6 - 23 (mg/dL)    Creatinine, Ser 5.36  0.50 - 1.35 (mg/dL)    Calcium 9.1  8.4 - 10.5 (mg/dL)    Total Protein 7.5  6.0 - 8.3 (g/dL)    Albumin 3.6  3.5 - 5.2 (g/dL)    AST 25  0 - 37 (U/L) HEMOLYZED SPECIMEN, RESULTS MAY BE AFFECTED   ALT 13  0 - 53 (U/L)    Alkaline Phosphatase 89  39 - 117 (U/L)    Total Bilirubin 0.6  0.3 - 1.2 (mg/dL)    GFR calc non Af Amer 56 (*) >90 (mL/min)    GFR calc Af Amer 65 (*) >90 (mL/min)   URINALYSIS, ROUTINE W REFLEX MICROSCOPIC     Status: Abnormal   Collection Time   01/06/12  1:35 PM      Component Value Range Comment   Color, Urine YELLOW  YELLOW     APPearance CLEAR  CLEAR     Specific Gravity, Urine 1.022  1.005 - 1.030     pH 6.0  5.0 - 8.0     Glucose, UA 100 (*) NEGATIVE (mg/dL)    Hgb urine dipstick NEGATIVE  NEGATIVE     Bilirubin Urine NEGATIVE  NEGATIVE     Ketones, ur 15 (*) NEGATIVE (mg/dL)    Protein, ur NEGATIVE  NEGATIVE (mg/dL)    Urobilinogen, UA 0.2  0.0 - 1.0 (mg/dL)    Nitrite NEGATIVE  NEGATIVE     Leukocytes, UA NEGATIVE  NEGATIVE  MICROSCOPIC NOT DONE ON URINES WITH NEGATIVE PROTEIN, BLOOD, LEUKOCYTES,  NITRITE, OR GLUCOSE <1000 mg/dL.    Radiological Exams on Admission: *RADIOLOGY REPORT*  Clinical Data: Fall, unsteady gait  CT HEAD WITHOUT CONTRAST  CT CERVICAL SPINE WITHOUT CONTRAST  Technique:  Multidetector CT imaging of the head and cervical spine  was performed following the standard protocol without intravenous  contrast. Multiplanar CT image reconstructions of the cervical  spine were also generated.  Comparison:  None  CT HEAD  Findings: There is a large region of hypoattenuation within the  right cerebellum most consistent with infarction. There is some  mass effect with compression of the fourth ventricle suggesting  edema. There is mild left or right midline shift the posterior  fossa. Quadrigeminal plate cistern is patent as well as the  suprasellar cistern. There is mild dilatation of the temporal  horns. No evidence of supra tentorial infarction. There is  periventricular white matter hypodensities. No acute intracranial  hemorrhage.  IMPRESSION:  1. Findings most consistent with large right cerebellar infarction  which is acute / subacute.  2. Mass effect in the posterior fossa with compression of the  fourth ventricle and midline shift within the posterior fossa.  CT CERVICAL SPINE  Findings: No prevertebral soft tissue swelling. Normal alignment  of cervical vertebral bodies. No loss of vertebral body height.  Normal facet articulation. Normal craniocervical junction. There  is endplate spurring at multiple levels and loss of disc space at  C5-C6.  No evidence epidural or paraspinal hematoma.  Incidental note of a 15 mm right thyroid nodule.  IMPRESSION:  1. No evidence of cervical spine fracture.  2. Multilevel spondylosis.  3. Thyroid nodule on the right. Consider further evaluation with  thyroid ultrasound. If patient is clinically hyperthyroid,  consider nuclear medicine thyroid uptake and scan.  Findings conveyed to Dr. Rubin Payor on 01/06/2012 at  1220 hours  Original Report Authenticated By: Genevive Bi, M.D.      *RADIOLOGY REPORT*  Clinical Data: Fall with weakness.  CHEST - 2 VIEW  Comparison: 07/24/2010 CT  Findings: This is a low volume film with mild bibasilar  atelectasis.  There is no evidence of focal airspace disease, pulmonary edema,  suspicious pulmonary nodule/mass, pleural effusion, or  pneumothorax.  No acute bony abnormalities are identified.  IMPRESSION:  Low volume film with mild basilar atelectasis.  Original Report Authenticated By: Rosendo Gros, M.D.   Assessment/Plan Principal Problem:  *CVA (cerebral vascular accident): Patient presents with a large Subacute/Acute right cerebellar CVA, with associated edema, mass effect and midline shift, as well as corresponding neurologic deficit. He will be admitted for secondary CVA prophylaxis and CVA workup, but as there is concern for the possible development of raised intracranial pressure and acute hydrocephalus, will admit to Neuro-ICU for close monitoring. Dr Lyman Speller has provided neurology consultation, and patient is considered not a thrombolysis candidate, in view of presentation outside the appropriate window. Will discuss further. Perhaps, Mannitol may need to be considered. Active Problems:  HTN (hypertension): BP is elevated at this time, but seems reasonable in context of recent CVA.  Further management will depend on clinical course.   Time Spent on Admission: 45 mins  ,CHRISTOPHER 01/06/2012, 4:01 PM

## 2012-01-06 NOTE — ED Provider Notes (Signed)
History     CSN: [id number]  Arrival date & time 01/06/12  1055   First MD Initiated Contact with Patient 01/06/12 1130      Chief Complaint  Patient presents with  . Fall  . Weakness    (Consider location/radiation/quality/duration/timing/severity/associated sxs/prior treatment) Patient is a 78 y.o. male presenting with fall and weakness. The history is provided by the patient.  Fall Pertinent negatives include no headaches.  Weakness The primary symptoms include dizziness. Primary symptoms do not include headaches.  Dizziness also occurs with weakness.  Additional symptoms include weakness. Additional symptoms do not include photophobia or hearing loss.   patient is here for dizziness and weakness. His wife states that she fell off the tailgate of a truck on Monday. He was laying down sleeping and fell. No injury at that time. On Tuesday he developed nausea vomiting diarrhea. He saw his primary care doctor on Wednesday. He was treated with Phenergan for the nausea since. No states she's having trouble walking and trouble reaching for things. He states it is like the move. He has no headache. He states he's had some trouble seeing. No headaches. No fevers. His nausea vomiting and diarrhea has improved. His primary care doctor is Dr. Clarene Duke.  Past Medical History  Diagnosis Date  . Hypertension     History reviewed. No pertinent past surgical history.  No family history on file.  History  Substance Use Topics  . Smoking status: Never Smoker   . Smokeless tobacco: Not on file  . Alcohol Use: No      Review of Systems  Constitutional: Positive for fatigue. Negative for chills.  HENT: Negative for hearing loss and ear discharge.   Eyes: Positive for visual disturbance. Negative for photophobia.  Respiratory: Negative for shortness of breath.   Cardiovascular: Negative for chest pain.  Musculoskeletal: Negative for back pain and joint swelling.  Neurological: Positive  for dizziness and weakness. Negative for tremors and headaches.    Allergies  Review of patient's allergies indicates no known allergies.  Home Medications   No current outpatient prescriptions on file.  BP 160/83  Pulse 99  Temp(Src) 98.2 F (36.8 C) (Oral)  Resp 100  Ht 4\' 11"  (1.499 m)  Wt 188 lb 4.4 oz (85.4 kg)  BMI 38.03 kg/m2  SpO2 99%  Physical Exam  Nursing note and vitals reviewed. Constitutional: He is oriented to person, place, and time. He appears well-developed and well-nourished.  HENT:  Head: Normocephalic and atraumatic.  Eyes: EOM are normal. Pupils are equal, round, and reactive to light.       No nystagmus.  Neck: Normal range of motion. Neck supple.  Cardiovascular: Normal rate, regular rhythm and normal heart sounds.   No murmur heard. Pulmonary/Chest: Effort normal and breath sounds normal.  Abdominal: Soft. Bowel sounds are normal. He exhibits no distension and no mass. There is no tenderness. There is no rebound and no guarding.  Musculoskeletal: Normal range of motion. He exhibits no edema.  Neurological: He is alert and oriented to person, place, and time. No cranial nerve deficit. Coordination abnormal.       Patient has finger-nose is off bilaterally. Grip strength is good bilaterally. Patient was not ambulated.  Skin: Skin is warm and dry.  Psychiatric: He has a normal mood and affect.    ED Course  Procedures (including critical care time)  Labs Reviewed  CBC - Abnormal; Notable for the following:    RBC 4.19 (*)    All  other components within normal limits  COMPREHENSIVE METABOLIC PANEL - Abnormal; Notable for the following:    Sodium 133 (*)    Glucose, Bld 158 (*)    GFR calc non Af Amer 56 (*)    GFR calc Af Amer 65 (*)    All other components within normal limits  URINALYSIS, ROUTINE W REFLEX MICROSCOPIC - Abnormal; Notable for the following:    Glucose, UA 100 (*)    Ketones, ur 15 (*)    All other components within normal  limits  DIFFERENTIAL  PROTIME-INR  APTT  BASIC METABOLIC PANEL  CBC  HEMOGLOBIN A1C  LIPID PANEL  MRSA PCR SCREENING   Dg Chest 2 View  01/06/2012  *RADIOLOGY REPORT*  Clinical Data: Fall with weakness.  CHEST - 2 VIEW  Comparison: 07/24/2010 CT  Findings: This is a low volume film with mild bibasilar atelectasis. There is no evidence of focal airspace disease, pulmonary edema, suspicious pulmonary nodule/mass, pleural effusion, or pneumothorax. No acute bony abnormalities are identified.  IMPRESSION: Low volume film with mild basilar atelectasis.  Original Report Authenticated By: Rosendo Gros, M.D.   Ct Head Wo Contrast  01/06/2012  *RADIOLOGY REPORT*  Clinical Data:  Fall, unsteady gait  CT HEAD WITHOUT CONTRAST CT CERVICAL SPINE WITHOUT CONTRAST  Technique:  Multidetector CT imaging of the head and cervical spine was performed following the standard protocol without intravenous contrast.  Multiplanar CT image reconstructions of the cervical spine were also generated.  Comparison: None  CT HEAD  Findings: There is a large region of hypoattenuation within the right cerebellum most consistent with infarction.  There is some mass effect with compression of the fourth ventricle suggesting edema. There is mild left or right midline shift the posterior fossa.  Quadrigeminal plate cistern is patent as well as the suprasellar cistern.  There is mild dilatation of the temporal horns.  No evidence of supra tentorial infarction.  There is periventricular white matter hypodensities.  No acute intracranial hemorrhage.  IMPRESSION:  1. Findings most consistent with large right cerebellar infarction which is acute / subacute.  2.  Mass effect in the posterior fossa with compression of the fourth ventricle and midline shift within the posterior fossa.  CT CERVICAL SPINE  Findings: No prevertebral soft tissue swelling.  Normal alignment of cervical vertebral bodies.  No loss of vertebral body height. Normal facet  articulation.  Normal craniocervical junction. There is endplate spurring at multiple levels and loss of disc space at C5-C6.  No evidence epidural or paraspinal hematoma.  Incidental note of a 15 mm right thyroid nodule.  IMPRESSION: 1.  No evidence of cervical spine fracture. 2.  Multilevel spondylosis. 3.  Thyroid nodule on the right. Consider further evaluation with thyroid ultrasound.  If patient is clinically hyperthyroid, consider nuclear medicine thyroid uptake and scan.  Findings conveyed to Dr. Rubin Payor on 01/06/2012 at 1220 hours  Original Report Authenticated By: Genevive Bi, M.D.   Ct Cervical Spine Wo Contrast  01/06/2012  *RADIOLOGY REPORT*  Clinical Data:  Fall, unsteady gait  CT HEAD WITHOUT CONTRAST CT CERVICAL SPINE WITHOUT CONTRAST  Technique:  Multidetector CT imaging of the head and cervical spine was performed following the standard protocol without intravenous contrast.  Multiplanar CT image reconstructions of the cervical spine were also generated.  Comparison: None  CT HEAD  Findings: There is a large region of hypoattenuation within the right cerebellum most consistent with infarction.  There is some mass effect with compression of the fourth  ventricle suggesting edema. There is mild left or right midline shift the posterior fossa.  Quadrigeminal plate cistern is patent as well as the suprasellar cistern.  There is mild dilatation of the temporal horns.  No evidence of supra tentorial infarction.  There is periventricular white matter hypodensities.  No acute intracranial hemorrhage.  IMPRESSION:  1. Findings most consistent with large right cerebellar infarction which is acute / subacute.  2.  Mass effect in the posterior fossa with compression of the fourth ventricle and midline shift within the posterior fossa.  CT CERVICAL SPINE  Findings: No prevertebral soft tissue swelling.  Normal alignment of cervical vertebral bodies.  No loss of vertebral body height. Normal facet  articulation.  Normal craniocervical junction. There is endplate spurring at multiple levels and loss of disc space at C5-C6.  No evidence epidural or paraspinal hematoma.  Incidental note of a 15 mm right thyroid nodule.  IMPRESSION: 1.  No evidence of cervical spine fracture. 2.  Multilevel spondylosis. 3.  Thyroid nodule on the right. Consider further evaluation with thyroid ultrasound.  If patient is clinically hyperthyroid, consider nuclear medicine thyroid uptake and scan.  Findings conveyed to Dr. Rubin Payor on 01/06/2012 at 1220 hours  Original Report Authenticated By: Genevive Bi, M.D.     1. Stroke      Date: 01/06/2012  Rate: 82  Rhythm: normal sinus rhythm  QRS Axis: left  Intervals: normal  ST/T Wave abnormalities: normal  Conduction Disutrbances:none  Narrative Interpretation:   Old EKG Reviewed: none available    MDM  Patient with unsteadiness. CT scan shows large cerebellar infarct. He also has mass effect. He has had symptoms since Wednesday. He is out of the window for TPA. I will consult neurology, to be a likely admission to medicine.      Juliet Rude. Rubin Payor, MD 01/06/12 2001

## 2012-01-06 NOTE — ED Notes (Signed)
PT wife reported he fell on Monday 12-31-2011. On tues he had N/V/D . Pt saw his PCP on WED. He was treated with Phenergan for nausea. Pt now reports he is to weak to walk . Pt denies any pain but feels like he broke something.

## 2012-01-06 NOTE — Progress Notes (Signed)
01/06/12 1515  Discharge Planning  Type of Residence Private residence  Living Arrangements Spouse/significant other  Home Care Services No  Support Systems Spouse/significant other  Do you have any problems obtaining your medications? (Per EMR, pt is unfunded)  Once you are discharged, how will you get to your follow-up appointment? Self;Family  Expected Discharge Date 01/09/12  Case Management Consult Needed Yes (Comment)  Social Work Consult Needed Yes (Comment)     Dionne Milo MSW [facility] Emergency Dept. Weekend/Social Worker [phone]

## 2012-01-06 NOTE — ED Provider Notes (Deleted)
  Physical Exam  BP 166/74  Pulse 85  Temp(Src) 98.6 F (37 C) (Oral)  Resp 18  SpO2 95%  Physical Exam  ED Course  Procedures  MDM Patient had nausea vomiting diarrhea last week. He also fell off the tailgate the wall he was sleeping. He states he did not hurt anything from the fall. Since seeing the doctor his head difficulty walking and reaching for things. He states it is like they move. He's also had some vision problems along the way. On neuro examination is finger-nose is soft bilaterally. Extraocular movements are intact. Cervical spine is nontender. He'll be moved to an exam room for further management      Harrold Donath R. Rubin Payor, MD 01/06/12 [phone]

## 2012-01-06 NOTE — Progress Notes (Signed)
01/06/12 1516  OTHER  CSW Follow Up Status Follow-up required     01/06/12 1516  OTHER  CSW Follow Up Status Follow-up required     Dionne Milo MSW [facility] Emergency Dept. Weekend/Social Worker [phone]

## 2012-01-06 NOTE — ED Provider Notes (Signed)
Medical screening examination/treatment/procedure(s) were conducted as a shared visit with non-physician practitioner(s) and myself.  I personally evaluated the patient during the encounter  Sendy Pluta R. Poetry Cerro, MD 01/06/12 1959 

## 2012-01-07 ENCOUNTER — Inpatient Hospital Stay (HOSPITAL_COMMUNITY): Payer: Medicare Other

## 2012-01-07 DIAGNOSIS — I6350 Cerebral infarction due to unspecified occlusion or stenosis of unspecified cerebral artery: Secondary | ICD-10-CM

## 2012-01-07 DIAGNOSIS — I517 Cardiomegaly: Secondary | ICD-10-CM

## 2012-01-07 DIAGNOSIS — I10 Essential (primary) hypertension: Secondary | ICD-10-CM

## 2012-01-07 LAB — LIPID PANEL
Cholesterol: 136 mg/dL (ref 0–200)
HDL: 63 mg/dL (ref >39)
LDL Cholesterol: 58 mg/dL (ref 0–99)
Total CHOL/HDL Ratio: 2.2 RATIO
Triglycerides: 75 mg/dL (ref <150)
VLDL: 15 mg/dL (ref 0–40)

## 2012-01-07 LAB — GLUCOSE, CAPILLARY
Glucose-Capillary: 130 mg/dL — ABNORMAL HIGH (ref 70–99)
Glucose-Capillary: 130 mg/dL — ABNORMAL HIGH (ref 70–99)

## 2012-01-07 LAB — BASIC METABOLIC PANEL
BUN: 13 mg/dL (ref 6–23)
CO2: 25 mEq/L (ref 19–32)
Calcium: 8.8 mg/dL (ref 8.4–10.5)
Chloride: 102 mEq/L (ref 96–112)
Creatinine, Ser: 1.02 mg/dL (ref 0.50–1.35)
GFR calc Af Amer: 78 mL/min — ABNORMAL LOW (ref >90)
GFR calc non Af Amer: 67 mL/min — ABNORMAL LOW (ref >90)
Glucose, Bld: 141 mg/dL — ABNORMAL HIGH (ref 70–99)
Potassium: 4.2 mEq/L (ref 3.5–5.1)
Sodium: 137 mEq/L (ref 135–145)

## 2012-01-07 LAB — CBC
HCT: 39.1 % (ref 39.0–52.0)
Hemoglobin: 13.4 g/dL (ref 13.0–17.0)
MCH: 32.4 pg (ref 26.0–34.0)
MCHC: 34.3 g/dL (ref 30.0–36.0)
MCV: 94.7 fL (ref 78.0–100.0)
Platelets: 225 10*3/uL (ref 150–400)
RBC: 4.13 MIL/uL — ABNORMAL LOW (ref 4.22–5.81)
RDW: 13.1 % (ref 11.5–15.5)
WBC: 7 10*3/uL (ref 4.0–10.5)

## 2012-01-07 LAB — HEMOGLOBIN A1C
Hgb A1c MFr Bld: 6.3 % — ABNORMAL HIGH (ref <5.7)
Mean Plasma Glucose: 134 mg/dL — ABNORMAL HIGH (ref <117)

## 2012-01-07 NOTE — Progress Notes (Signed)
Name: Shane Juarez MRN: [id number] DOB: 1930-08-24    LOS: 1  PCCM ADMISSION NOTE  History of Present Illness: This is an 78 year old male, with no significant past medical history, other than colon polyps, s/p colonoscopies/extraction 04/2001 and 06/2001, HTN, presenting as described above. History is obtained from patient's spouse, who was present in the ED. Acute onset 3 days prior of falling. Pt now with ataxia. 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  Due to size of CVA pt will need ICU/SDU bed.    Lines / Drains: none  Cultures: none  Antibiotics: none   Vital Signs: Temp:  [97.2 F (36.2 C)-98.6 F (37 C)] 98.6 F (37 C) (04/15 1600) Pulse Rate:  [75-107] 102  (04/15 1900) Resp:  [13-24] 19  (04/15 1900) BP: (153-180)/(74-95) 177/92 mmHg (04/15 1900) SpO2:  [96 %-100 %] 96 % (04/15 1900) I/O last 3 completed shifts: In: 1555 [P.O.:600; I.V.:955] Out: 950 [Urine:950]  Physical Examination: General:  Awake alert Neuro:  Moves all 4s.  ataxic   HEENT:  Dry mucus membranes Neck:  Supple    Cardiovascular:  RRR nl s1/s2  No s3/s4 Lungs:  clear Abdomen:  Soft NT BSA Musculoskeletal:  From  Skin:  clear   Labs and Imaging:  Reviewed.  Please refer to the Assessment and Plan section for relevant results. Dg Chest 2 View  01/06/2012  *RADIOLOGY REPORT*  Clinical Data: Fall with weakness.  CHEST - 2 VIEW  Comparison: 07/24/2010 CT  Findings: This is a low volume film with mild bibasilar atelectasis. There is no evidence of focal airspace disease, pulmonary edema, suspicious pulmonary nodule/mass, pleural effusion, or pneumothorax. No acute bony abnormalities are identified.  IMPRESSION: Low volume film with mild basilar atelectasis.  Original Report Authenticated By: Rosendo Gros, M.D.   Ct Head Wo Contrast  01/07/2012  *RADIOLOGY REPORT*  Clinical Data: Follow up recent CVA; history of fall.  CT HEAD WITHOUT CONTRAST  Technique:  Contiguous axial images were  obtained from the base of the skull through the vertex without contrast.  Comparison: CT of the head performed 01/06/2012  Findings:  There has been mild interval evolution of the patient's acute/subacute right cerebellar infarct, which again demonstrates mass effect, with slightly increased leftward midline shift at the posterior fossa, measuring approximately 1.2 cm.  There is compression of the fourth ventricle, as previously noted.  A smaller left-sided cerebellar infarct is again noted.  There is no evidence of hemorrhagic transformation.  There is mass effect on the cerebellar peduncles, without evidence of transtentorial herniation at this time.  Scattered periventricular and subcortical white matter change likely reflects small vessel ischemic microangiopathy.  No definite hydrocephalus is characterized.  The third and lateral ventricles, and basal ganglia are unremarkable in appearance.  The cerebral hemispheres demonstrate grossly normal gray-white differentiation.  There is no evidence of fracture; visualized osseous structures are unremarkable in appearance.  The orbits are within normal limits. The paranasal sinuses and mastoid air cells are well-aerated.  No significant soft tissue abnormalities are seen.  IMPRESSION:  1.  Mild interval evolution of acute/subacute right cerebellar infarct, with significant mass effect and slightly increased leftward midline shift at the posterior fossa, measuring approximately 1.2 cm.  Compression of the fourth ventricle again noted.  No evidence of hemorrhagic transformation. 2.  Smaller left-sided acute/subacute cerebellar infarct again noted. 3.  Mass effect on the cerebellar peduncles, without definite evidence of transtentorial herniation; no definite hydrocephalus seen. 4.  Scattered  small vessel ischemic microangiopathy.  Original Report Authenticated By: Tonia Ghent, M.D.   Ct Head Wo Contrast  01/06/2012  *RADIOLOGY REPORT*  Clinical Data:  Fall, unsteady  gait  CT HEAD WITHOUT CONTRAST CT CERVICAL SPINE WITHOUT CONTRAST  Technique:  Multidetector CT imaging of the head and cervical spine was performed following the standard protocol without intravenous contrast.  Multiplanar CT image reconstructions of the cervical spine were also generated.  Comparison: None  CT HEAD  Findings: There is a large region of hypoattenuation within the right cerebellum most consistent with infarction.  There is some mass effect with compression of the fourth ventricle suggesting edema. There is mild left or right midline shift the posterior fossa.  Quadrigeminal plate cistern is patent as well as the suprasellar cistern.  There is mild dilatation of the temporal horns.  No evidence of supra tentorial infarction.  There is periventricular white matter hypodensities.  No acute intracranial hemorrhage.  IMPRESSION:  1. Findings most consistent with large right cerebellar infarction which is acute / subacute.  2.  Mass effect in the posterior fossa with compression of the fourth ventricle and midline shift within the posterior fossa.  CT CERVICAL SPINE  Findings: No prevertebral soft tissue swelling.  Normal alignment of cervical vertebral bodies.  No loss of vertebral body height. Normal facet articulation.  Normal craniocervical junction. There is endplate spurring at multiple levels and loss of disc space at C5-C6.  No evidence epidural or paraspinal hematoma.  Incidental note of a 15 mm right thyroid nodule.  IMPRESSION: 1.  No evidence of cervical spine fracture. 2.  Multilevel spondylosis. 3.  Thyroid nodule on the right. Consider further evaluation with thyroid ultrasound.  If patient is clinically hyperthyroid, consider nuclear medicine thyroid uptake and scan.  Findings conveyed to Dr. Rubin Payor on 01/06/2012 at 1220 hours  Original Report Authenticated By: Genevive Bi, M.D.   Ct Cervical Spine Wo Contrast  01/06/2012  *RADIOLOGY REPORT*  Clinical Data:  Fall, unsteady gait   CT HEAD WITHOUT CONTRAST CT CERVICAL SPINE WITHOUT CONTRAST  Technique:  Multidetector CT imaging of the head and cervical spine was performed following the standard protocol without intravenous contrast.  Multiplanar CT image reconstructions of the cervical spine were also generated.  Comparison: None  CT HEAD  Findings: There is a large region of hypoattenuation within the right cerebellum most consistent with infarction.  There is some mass effect with compression of the fourth ventricle suggesting edema. There is mild left or right midline shift the posterior fossa.  Quadrigeminal plate cistern is patent as well as the suprasellar cistern.  There is mild dilatation of the temporal horns.  No evidence of supra tentorial infarction.  There is periventricular white matter hypodensities.  No acute intracranial hemorrhage.  IMPRESSION:  1. Findings most consistent with large right cerebellar infarction which is acute / subacute.  2.  Mass effect in the posterior fossa with compression of the fourth ventricle and midline shift within the posterior fossa.  CT CERVICAL SPINE  Findings: No prevertebral soft tissue swelling.  Normal alignment of cervical vertebral bodies.  No loss of vertebral body height. Normal facet articulation.  Normal craniocervical junction. There is endplate spurring at multiple levels and loss of disc space at C5-C6.  No evidence epidural or paraspinal hematoma.  Incidental note of a 15 mm right thyroid nodule.  IMPRESSION: 1.  No evidence of cervical spine fracture. 2.  Multilevel spondylosis. 3.  Thyroid nodule on the right. Consider further evaluation  with thyroid ultrasound.  If patient is clinically hyperthyroid, consider nuclear medicine thyroid uptake and scan.  Findings conveyed to Dr. Rubin Payor on 01/06/2012 at 1220 hours  Original Report Authenticated By: Genevive Bi, M.D.   Dg Chest Port 1 View  01/07/2012  *RADIOLOGY REPORT*  Clinical Data: Follow-up lung fields; recent CVA.   PORTABLE CHEST - 1 VIEW  Comparison: Chest radiograph performed 01/06/2012  Findings: The lungs are mildly hypoexpanded.  Mild bibasilar airspace opacities, right greater than left, raise concern for pneumonia, though aspiration or atelectasis could have a similar appearance.  There is no evidence of pleural effusion or pneumothorax.  The cardiomediastinal silhouette is within normal limits.  No acute osseous abnormalities are seen.  IMPRESSION: Mild bibasilar airspace opacities, right greater than left, raise concern for pneumonia, though aspiration or atelectasis could have a similar appearance.  Lungs mildly hypoexpanded.  Original Report Authenticated By: Tonia Ghent, M.D.    Lab 01/07/12 0730 01/06/12 1215  NA 137 133*  K 4.2 4.3  CL 102 99  CO2 25 25  GLUCOSE 141* 158*  BUN 13 14  CREATININE 1.02 1.18  CALCIUM 8.8 9.1  MG -- --  PHOS -- --    Lab 01/07/12 0730 01/06/12 1215  HGB 13.4 13.9  HCT 39.1 39.5  WBC 7.0 9.1  PLT 225 232    Lab 01/06/12 1529 01/06/12 1215  AST -- 25  ALT -- 13  ALKPHOS -- 89  BILITOT -- 0.6  PROT -- 7.5  ALBUMIN -- 3.6  INR 0.98 --    Assessment and Plan: Patient Active Hospital Problem List: CVA (cerebral vascular accident) (01/06/2012)   Assessment: cerebellar CVA   Plan: admitted ICU to PCCM, see stroke orders, frequent neuro checks , high risk for 4th ventric obstruction and herniation , needs close observation. Stroke team following  HTN (hypertension) (01/06/2012)   Assessment: monitor and treat    Plan: Keep SBP <170    Billy Fischer, MD;  PCCM service; Mobile [phone]

## 2012-01-07 NOTE — Evaluation (Signed)
Clinical/Bedside Swallow Evaluation Patient Details  Name: Shane Juarez MRN: [id number] DOB: 1930/01/21 Today's Date: 01/07/2012  Past Medical History:  Past Medical History  Diagnosis Date  . Hypertension    Past Surgical History: History reviewed. No pertinent past surgical history. HPI:  Shane Juarez is a 78 y.o. 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 Stroke secondary to unknown embolic source. Passed RN stroke swallow screen initially, but developed subsequent cough with PO intake, hence clinical swallow eval ordered.     Assessment/Recommendations/Treatment Plan   Clinical Impression: Pt presents with functional oropharyngeal swallow with active/attentive mastication, strong laryngeal response per palpation, and no overt indicators of compromised airway protection.  Recommend resumption of PO diet (regular consistency/ thin liquids);  meds whole with liquid - give with purees if coughing ensues.  No SLP f/u for swallowing indicated - will sign-off. Risk for Aspiration: Mild  Swallow Evaluation Recommendations Diet Recommendations: Regular;Thin liquid Liquid Administration via: Cup Medication Administration: Whole meds with liquid Supervision: Patient able to self feed Follow up Recommendations: None        Individuals Consulted Consulted and Agree with Results and Recommendations: Patient  Swallowing Goals n/a   Shane Juarez L. Shane Juarez, [place] CCC/SLP Pager [phone]  Shane Juarez Shane Juarez 01/07/2012,11:56 AM

## 2012-01-07 NOTE — Evaluation (Signed)
Physical Therapy Evaluation Patient Details Name: Shane Juarez MRN: [id number] DOB: 12-Apr-1930 Today's Date: 01/07/2012  Problem List:  Patient Active Problem List  Diagnoses  . CVA (cerebral vascular accident)  . HTN (hypertension)    Past Medical History:  Past Medical History  Diagnosis Date  . Hypertension    Past Surgical History: History reviewed. No pertinent past surgical history.  PT Assessment/Plan/Recommendation PT Assessment Clinical Impression Statement: Pt presents with a medical diagnosis of CVA with decreased coordination and functional strength. Pt will benefit from skilled PT in the acute care setting in order to maximize functional mobility and safety prior to d/c. PT Recommendation/Assessment: Patient will need skilled PT in the acute care venue PT Problem List: Decreased strength;Decreased activity tolerance;Decreased balance;Decreased mobility;Decreased coordination PT Therapy Diagnosis : Difficulty walking PT Plan PT Frequency: Min 4X/week PT Recommendation Follow Up Recommendations: Other (comment) (TBA) EOB sitting only this evaluation due to DR Pearlean Brownie order ) Equipment Recommended: Other (comment) (TBD) PT Goals  Acute Rehab PT Goals PT Goal Formulation: With patient Time For Goal Achievement: 2 weeks Pt will go Sit to Stand: with modified independence PT Goal: Sit to Stand - Progress: Goal set today Pt will go Stand to Sit: with modified independence PT Goal: Stand to Sit - Progress: Goal set today Pt will Transfer Bed to Chair/Chair to Bed: with supervision PT Transfer Goal: Bed to Chair/Chair to Bed - Progress: Goal set today Pt will Ambulate: >150 feet;with supervision;with least restrictive assistive device PT Goal: Ambulate - Progress: Goal set today  PT Evaluation Precautions/Restrictions  Precautions Precautions: Fall Prior Functioning  Home Living Lives With: Spouse Available Help at Discharge: Available 24 hours/day Type of Home:  House Home Access: Level entry Home Layout: One level Bathroom Shower/Tub: Walk-in shower;Door (second bathroom has a seat) Firefighter: Standard Bathroom Accessibility: Yes How Accessible: Accessible via walker Home Adaptive Equipment: Walker - rolling Prior Function Level of Independence: Independent Able to Take Stairs?: Yes Driving: Yes Vocation: Retired Financial risk analyst Arousal/Alertness: Awake/alert Overall Cognitive Status: Appears within functional limits for tasks assessed Orientation Level: Oriented X4 Extremity/Trunk Assessment   Mobility (including Balance) Bed Mobility Bed Mobility: Yes Supine to Sit: 4: Min assist;HOB elevated (Comment degrees) Supine to Sit Details (indicate cue type and reason): posterior lean on the Rt side with static sitting. pt required mIn (A) with shoulder flexion for balance Transfers Transfers: Yes Sit to Stand: 3: Mod assist;With upper extremity assist;From bed Sit to Stand Details (indicate cue type and reason): Mod assist for stability. VC for hand placement and anterior translation Stand to Sit: 2: Max assist;With upper extremity assist;To bed Stand to Sit Details: Max assist to control descent onto bed Ambulation/Gait Ambulation/Gait: No  Balance Balance Assessed: Yes Static Sitting Balance Static Sitting - Balance Support: Feet supported;Bilateral upper extremity supported Static Sitting - Level of Assistance: 7: Independent Static Sitting - Comment/# of Minutes: able to static sit independently, although while completing MMT, required mod assist without feet supported Exercise    End of Session PT - End of Session Equipment Utilized During Treatment: Gait belt Activity Tolerance: Patient tolerated treatment well;Treatment limited secondary to medical complications (Comment) (Bedrest with BSC transfer only) Patient left: in bed;with call bell in reach Nurse Communication: Mobility status for  transfers General Behavior During Session: [facility] for tasks performed Cognition: [facility] for tasks performed  Milana Kidney 01/07/2012, 2:47 PM

## 2012-01-07 NOTE — Evaluation (Signed)
Occupational Therapy Evaluation Patient Details Name: Shane Juarez MRN: [id number] DOB: 1930-01-17 Today's Date: 01/07/2012  Problem List:  Patient Active Problem List  Diagnoses  . CVA (cerebral vascular accident)  . HTN (hypertension)    Past Medical History:  Past Medical History  Diagnosis Date  . Hypertension    Past Surgical History: History reviewed. No pertinent past surgical history.  OT Assessment/Plan/Recommendation OT Assessment Clinical Impression Statement: 78 yo male s/p right cerebellar infarct with cerebellar  Cytotoxic edema, at risk for neuro worsening. Stroke secondary to unknown embolic source. NIH 2 OT to follow acutely. d/c planning to be further assessed with increased activity tolerance orders OT Recommendation/Assessment: Patient will need skilled OT in the acute care venue OT Problem List: Decreased strength;Decreased activity tolerance;Impaired balance (sitting and/or standing);Decreased coordination;Decreased knowledge of use of DME or AE;Decreased knowledge of precautions OT Therapy Diagnosis : Generalized weakness OT Plan OT Frequency: Min 2X/week OT Treatment/Interventions: Self-care/ADL training;DME and/or AE instruction;Therapeutic activities;Patient/family education;Balance training OT Recommendation Follow Up Recommendations: Other (comment) (TBA) EOB sitting only this evaluation due to DR Pearlean Brownie order set. Individuals Consulted Consulted and Agree with Results and Recommendations: Patient;Family member/caregiver OT Goals Acute Rehab OT Goals OT Goal Formulation: With patient/family ADL Goals Pt Will Perform Grooming: with set-up;Standing at sink ADL Goal: Grooming - Progress: Goal set today Pt Will Perform Upper Body Bathing: with set-up;Sit to stand from bed;Sit to stand from chair ADL Goal: Upper Body Bathing - Progress: Goal set today Pt Will Perform Lower Body Bathing: with set-up;Sit to stand from chair;Sit to stand from bed ADL Goal:  Lower Body Bathing - Progress: Goal set today Pt Will Perform Upper Body Dressing: with set-up;Sit to stand from chair;Sit to stand from bed ADL Goal: Upper Body Dressing - Progress: Goal set today Pt Will Perform Lower Body Dressing: with set-up;Sit to stand from chair;Sit to stand from bed ADL Goal: Lower Body Dressing - Progress: Goal set today Pt Will Transfer to Toilet: with min assist;3-in-1 ADL Goal: Toilet Transfer - Progress: Goal set today Pt Will Perform Toileting - Clothing Manipulation: with set-up;Sitting on 3-in-1 or toilet ADL Goal: Toileting - Clothing Manipulation - Progress: Goal set today Pt Will Perform Toileting - Hygiene: with set-up;Sit to stand from 3-in-1/toilet ADL Goal: Toileting - Hygiene - Progress: Goal set today  OT Evaluation Precautions/Restrictions  Precautions Precautions: Fall Prior Functioning Home Living Lives With: Spouse Available Help at Discharge: Available 24 hours/day Type of Home: House Home Access: Level entry Home Layout: One level Bathroom Shower/Tub: Walk-in shower;Door (second bathroom has a seat) Firefighter: Standard Bathroom Accessibility: Yes How Accessible: Accessible via walker Home Adaptive Equipment: Walker - rolling Prior Function Level of Independence: Independent Able to Take Stairs?: Yes Driving: Yes Vocation: Retired 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 Pearlean Brownie. Pt supine<>sit EOB only. Pt with sensation deficits and motor planning deficits on Rt Side. Pt is right hand dominant.  Vision/Perception  Vision - History Baseline Vision: Wears glasses all the time (suppose to wear glasses for driving)  Patient Visual Report: No change from baseline Cognition Cognition Arousal/Alertness: Awake/alert Overall Cognitive Status: Appears within functional limits for tasks assessed Orientation Level: Oriented X4 Sensation/Coordination Sensation Light Touch: Impaired by gross assessment (Rt UE / LE and face) Coordination Gross Motor Movements are Fluid and Coordinated: Yes Fine Motor Movements are Fluid and Coordinated: No Finger Nose Finger Test: pt decreased speed and accuracy on Rt side.  Extremity Assessment RUE Assessment RUE Assessment: Within Functional Limits LUE Assessment LUE Assessment: Within Functional Limits Mobility  Bed Mobility Bed Mobility: Yes Supine to Sit: 4: Min assist;HOB elevated (Comment degrees) Supine to Sit Details (indicate cue type and reason): posterior lean on the Rt side with static sitting. pt required mIn (A) with shoulder flexion for balance Transfers Transfers: No Exercises   End of Session OT - End of Session Activity Tolerance: Patient tolerated treatment well Patient left: in bed;with call bell in reach General Behavior During Session: [facility] for tasks performed Cognition: [facility] for tasks performed   Lucile Shutters 01/07/2012, 11:44 AM  Pager: [phone]

## 2012-01-07 NOTE — Progress Notes (Signed)
VASCULAR LAB PRELIMINARY  PRELIMINARY  PRELIMINARY  PRELIMINARY  Carotid duplex  completed.    Preliminary report:  Bilateral:  No evidence of hemodynamically significant internal carotid artery stenosis.   Vertebral artery flow is antegrade.  Right vertebral waveform is spiked.     Terance Hart, RVT 01/07/2012, 9:54 AM

## 2012-01-07 NOTE — Progress Notes (Signed)
Stroke Team Progress Note  HISTORY Shane Juarez is an 78 y.o. male who was unable to walk since Wednesday 01/02/2012. He presented 01/06/2012.  Denies headaches or nausea. Also had difficulty with using his right arm. NIHSS of 2. 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 Due to size of CVA pt will need ICU/SDU bed. Patient was not a TPA candidate secondary to delay in arrival. He was admitted to the neuro ICU for further evaluation and treatment.  SUBJECTIVE No family is at the bedside.  Overall he feels his condition is unchanged. Initially passed RN swallow, then had a delayed cough. RN made NPO. ST to assess.Has mild headaches and right sided ataxia persists.CT head this am shows mild increase in cytotoxic edema with effacement of fourth ventricle but no hydrocephalous.  OBJECTIVE Most recent Vital Signs: Filed Vitals:   01/07/12 0100 01/07/12 0300 01/07/12 0400 01/07/12 0700  BP: 161/85 153/75  164/80  Pulse: 87 79  77  Temp:   98.5 F (36.9 C) 98.1 F (36.7 C)  TempSrc:   Oral Oral  Resp: 14 13  13   Height:      Weight:      SpO2: 98% 99%  100%   CBG (last 3)   Basename 01/07/12 0609 01/06/12 2349  GLUCAP 130* 129*   Intake/Output from previous day: 04/14 0701 - 04/15 0700 In: 1405 [P.O.:600; I.V.:805] Out: 725 [Urine:725]  IV Fluid Intake:     . sodium chloride    . sodium chloride 75 mL/hr at 01/07/12 0700  . DISCONTD: sodium chloride 125 mL/hr at 01/06/12 1249   MEDICATIONS    . aspirin  324 mg Oral Once  . aspirin  300 mg Rectal Daily   Or  . aspirin  325 mg Oral Daily  . pantoprazole (PROTONIX) IV  40 mg Intravenous QHS   PRN:  labetalol, ondansetron (ZOFRAN) IV, senna-docusate  Diet:  NPO  Activity:  Bedrest DVT Prophylaxis:  SCDs   CLINICALLY SIGNIFICANT STUDIES CBC    Component Value Date/Time   WBC 7.0 01/07/2012 0730   RBC 4.13* 01/07/2012 0730   HGB 13.4 01/07/2012 0730   HCT 39.1 01/07/2012 0730   PLT 225 01/07/2012 0730   MCV 94.7  01/07/2012 0730   MCH 32.4 01/07/2012 0730   MCHC 34.3 01/07/2012 0730   RDW 13.1 01/07/2012 0730   LYMPHSABS 1.8 01/06/2012 1215   MONOABS 0.8 01/06/2012 1215   EOSABS 0.0 01/06/2012 1215   BASOSABS 0.0 01/06/2012 1215   CMP    Component Value Date/Time   NA 133* 01/06/2012 1215   K 4.3 01/06/2012 1215   CL 99 01/06/2012 1215   CO2 25 01/06/2012 1215   GLUCOSE 158* 01/06/2012 1215   BUN 14 01/06/2012 1215   CREATININE 1.18 01/06/2012 1215   CALCIUM 9.1 01/06/2012 1215   PROT 7.5 01/06/2012 1215   ALBUMIN 3.6 01/06/2012 1215   AST 25 01/06/2012 1215   ALT 13 01/06/2012 1215   ALKPHOS 89 01/06/2012 1215   BILITOT 0.6 01/06/2012 1215   GFRNONAA 56* 01/06/2012 1215   GFRAA 65* 01/06/2012 1215   COAGS Lab Results  Component Value Date   INR 0.98 01/06/2012   Lipid Panel No results found for this basename: chol, trig, hdl, cholhdl, vldl, ldlcalc   HgbA1C  No results found for this basename: HGBA1C   Cardiac Panel (last 3 results) No results found for this basename: CKTOTAL:3,CKMB:3,TROPONINI:3,RELINDX:3 in the last 72 hours Urinalysis    Component Value  Date/Time   COLORURINE YELLOW 01/06/2012 1335   APPEARANCEUR CLEAR 01/06/2012 1335   LABSPEC 1.022 01/06/2012 1335   PHURINE 6.0 01/06/2012 1335   GLUCOSEU 100* 01/06/2012 1335   HGBUR NEGATIVE 01/06/2012 1335   BILIRUBINUR NEGATIVE 01/06/2012 1335   KETONESUR 15* 01/06/2012 1335   PROTEINUR NEGATIVE 01/06/2012 1335   UROBILINOGEN 0.2 01/06/2012 1335   NITRITE NEGATIVE 01/06/2012 1335   LEUKOCYTESUR NEGATIVE 01/06/2012 1335   Urine Drug Screen  No results found for this basename: labopia, cocainscrnur, labbenz, amphetmu, thcu, labbarb    Alcohol Level No results found for this basename: eth   Ct Cervical Spine  01/06/2012  1.  No evidence of cervical spine fracture. 2.  Multilevel spondylosis. 3.  Thyroid nodule on the right. Consider further evaluation with thyroid ultrasound.  If patient is clinically hyperthyroid, consider nuclear medicine  thyroid uptake and scan.    CT of the brain   01/07/2012  1.  Mild interval evolution of acute/subacute right cerebellar infarct, with significant mass effect and slightly increased leftward midline shift at the posterior fossa, measuring approximately 1.2 cm.  Compression of the fourth ventricle again noted.  No evidence of hemorrhagic transformation. 2.  Smaller left-sided acute/subacute cerebellar infarct again noted. 3.  Mass effect on the cerebellar peduncles, without definite evidence of transtentorial herniation; no definite hydrocephalus seen. 4.  Scattered small vessel ischemic microangiopathy. 01/06/2012   1. Findings most consistent with large right cerebellar infarction which is acute / subacute.  2.  Mass effect in the posterior fossa with compression of the fourth ventricle and midline shift within the posterior fossa.   MRI of the brain    MRA of the brain    2D Echocardiogram    Carotid Doppler    CXR   01/07/2012  Mild bibasilar airspace opacities, right greater than left, raise concern for pneumonia, though aspiration or atelectasis could have a similar appearance.  Lungs mildly hypoexpanded. 01/06/2012  Low volume film with mild basilar atelectasis  EKG  normal sinus rhythm, left axis deviation.   Physical Exam    Awake alert. Afebrile. Head is nontraumatic. Neck is supple without bruit. Hearing is normal. Cardiac exam no murmur or gallop. Lungs are clear to auscultation. Distal pulses are well felt.   Neurologic Exam : Awake  Alert oriented x 3. Mildy dysarhtricspeech  But normal language.Eye movements full without nystagmus but mild dysmetric saccades.. Face symmetric. Tongue midline. Normal strength, tone and  Reflexes.Impaired right upper extremity coordination with finger to nose dysmetria.. Normal sensation. Gait deferred.   ASSESSMENT [name] is a 78 y.o. male with a right cerebellar infarct with cerebellar  Cytotoxic edema, at risk for neuro worsening.  Stroke secondary to unknown embolic source. On no antiplatelets prior to admission. Now on aspirin 300 mg rectally every day for secondary stroke prevention.  Patient with resultant headache, slurred speech, right sided ataxia.  -hypertension -hyperglycemia -hyponatremia  Hospital day # 1  TREATMENT/PLAN -Continue Aspirin 300 mg suppositdaily for secondary stroke prevention. -stroke workup - MRI, MRA, CD, 2D, labs -may be OOB to bedside only -RN accompany off floor for testing -CT of the head in the am -ST check swallow -lovenox for VTE in the am if stable -This patient is critically ill and at significant risk of neurological worsening, death and care requires constant monitoring of vital signs, hemodynamics,respiratory and cardiac monitoring,review of multiple databases, neurological assessment, discussion with family, other specialists and medical decision making of high complexity. I spent 30  minutes of neurocritical care time  in the care of  this patient.  Joaquin Music, ANP-BC, GNP-BC [facility] Pager: [phone] 01/07/2012 8:16 AM  Dr. Delia Heady, Stroke Center Medical Director, has personally reviewed chart, pertinent data, examined the patient and developed the plan of care.

## 2012-01-08 ENCOUNTER — Encounter (HOSPITAL_COMMUNITY): Payer: Self-pay | Admitting: Physical Medicine and Rehabilitation

## 2012-01-08 ENCOUNTER — Inpatient Hospital Stay (HOSPITAL_COMMUNITY): Payer: Medicare Other

## 2012-01-08 DIAGNOSIS — I6330 Cerebral infarction due to thrombosis of unspecified cerebral artery: Secondary | ICD-10-CM

## 2012-01-08 LAB — GLUCOSE, CAPILLARY
Glucose-Capillary: 138 mg/dL — ABNORMAL HIGH (ref 70–99)
Glucose-Capillary: 155 mg/dL — ABNORMAL HIGH (ref 70–99)
Glucose-Capillary: 128 mg/dL — ABNORMAL HIGH (ref 70–99)
Glucose-Capillary: 180 mg/dL — ABNORMAL HIGH (ref 70–99)

## 2012-01-08 MED ORDER — AMLODIPINE BESYLATE 5 MG PO TABS
5.0000 mg | ORAL_TABLET | Freq: Every day | ORAL | Status: DC
  Administered 2012-01-08 – 2012-01-09 (×2): 5 mg via ORAL
  Filled 2012-01-08 (×2): qty 1

## 2012-01-08 MED ORDER — ENOXAPARIN SODIUM 40 MG/0.4ML ~~LOC~~ SOLN
40.0000 mg | SUBCUTANEOUS | Status: DC
  Administered 2012-01-08 – 2012-01-09 (×2): 40 mg via SUBCUTANEOUS
  Filled 2012-01-08 (×4): qty 0.4

## 2012-01-08 NOTE — Progress Notes (Signed)
Stroke Team Progress Note  HISTORY Shane Juarez is an 78 y.o. male who was unable to walk since Wednesday 01/02/2012. He presented 01/06/2012.  Denies headaches or nausea. Also had difficulty with using his right arm. NIHSS of 2. 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 Due to size of CVA pt will need ICU/SDU bed. Patient was not a TPA candidate secondary to delay in arrival. He was admitted to the neuro ICU for further evaluation and treatment.  SUBJECTIVE Patient lives with wife who is in good health. States his headache from yesterday has resolved. Stable no complaints  OBJECTIVE Most recent Vital Signs: Filed Vitals:   01/07/12 2200 01/08/12 0000 01/08/12 0100 01/08/12 0400  BP: 178/80 156/76 141/61   Pulse:      Temp:    98.6 F (37 C)  TempSrc:      Resp:      Height:      Weight:      SpO2:       CBG (last 3)  Basename 01/07/12 1218 01/07/12 0609 01/06/12 2349  GLUCAP 130* 130* 129*   Intake/Output from previous day: 04/15 0701 - 04/16 0700 In: 1525 [P.O.:400; I.V.:1125] Out: 1025 [Urine:1025]  IV Fluid Intake:     . sodium chloride    . DISCONTD: sodium chloride 75 mL/hr at 01/07/12 0700   MEDICATIONS    . aspirin  300 mg Rectal Daily   Or  . aspirin  325 mg Oral Daily  . DISCONTD: pantoprazole (PROTONIX) IV  40 mg Intravenous QHS   PRN:  labetalol, ondansetron (ZOFRAN) IV, senna-docusate  Diet:  Cardiac thin liquids Activity:  Up to bedside DVT Prophylaxis:  SCDs   CLINICALLY SIGNIFICANT STUDIES CBC    Component Value Date/Time   WBC 7.0 01/07/2012 0730   RBC 4.13* 01/07/2012 0730   HGB 13.4 01/07/2012 0730   HCT 39.1 01/07/2012 0730   PLT 225 01/07/2012 0730   MCV 94.7 01/07/2012 0730   MCH 32.4 01/07/2012 0730   MCHC 34.3 01/07/2012 0730   RDW 13.1 01/07/2012 0730   LYMPHSABS 1.8 01/06/2012 1215   MONOABS 0.8 01/06/2012 1215   EOSABS 0.0 01/06/2012 1215   BASOSABS 0.0 01/06/2012 1215   CMP    Component Value Date/Time   NA 137 01/07/2012 0730   K 4.2 01/07/2012 0730   CL 102 01/07/2012 0730   CO2 25 01/07/2012 0730   GLUCOSE 141* 01/07/2012 0730   BUN 13 01/07/2012 0730   CREATININE 1.02 01/07/2012 0730   CALCIUM 8.8 01/07/2012 0730   PROT 7.5 01/06/2012 1215   ALBUMIN 3.6 01/06/2012 1215   AST 25 01/06/2012 1215   ALT 13 01/06/2012 1215   ALKPHOS 89 01/06/2012 1215   BILITOT 0.6 01/06/2012 1215   GFRNONAA 67* 01/07/2012 0730   GFRAA 78* 01/07/2012 0730   COAGS Lab Results  Component Value Date   INR 0.98 01/06/2012   Lipid Panel     Component Value Date/Time   CHOL 136 01/07/2012 0730   TRIG 75 01/07/2012 0730   HDL 63 01/07/2012 0730   CHOLHDL 2.2 01/07/2012 0730   VLDL 15 01/07/2012 0730   LDLCALC 58 01/07/2012 0730   HgbA1C  Lab Results  Component Value Date   HGBA1C 6.3* 01/07/2012   Cardiac Panel (last 3 results) No results found for this basename: CKTOTAL:3,CKMB:3,TROPONINI:3,RELINDX:3 in the last 72 hours Urinalysis    Component Value Date/Time   COLORURINE YELLOW 01/06/2012 1335   APPEARANCEUR CLEAR 01/06/2012 1335  LABSPEC 1.022 01/06/2012 1335   PHURINE 6.0 01/06/2012 1335   GLUCOSEU 100* 01/06/2012 1335   HGBUR NEGATIVE 01/06/2012 1335   BILIRUBINUR NEGATIVE 01/06/2012 1335   KETONESUR 15* 01/06/2012 1335   PROTEINUR NEGATIVE 01/06/2012 1335   UROBILINOGEN 0.2 01/06/2012 1335   NITRITE NEGATIVE 01/06/2012 1335   LEUKOCYTESUR NEGATIVE 01/06/2012 1335   Urine Drug Screen  No results found for this basename: labopia,  cocainscrnur,  labbenz,  amphetmu,  thcu,  labbarb    Alcohol Level No results found for this basename: eth   Ct Cervical Spine  01/06/2012  1.  No evidence of cervical spine fracture. 2.  Multilevel spondylosis. 3.  Thyroid nodule on the right. Consider further evaluation with thyroid ultrasound.  If patient is clinically hyperthyroid, consider nuclear medicine thyroid uptake and scan.    CT of the brain   01/07/2012  1.  Mild interval evolution of acute/subacute right cerebellar infarct, with significant  mass effect and slightly increased leftward midline shift at the posterior fossa, measuring approximately 1.2 cm.  Compression of the fourth ventricle again noted.  No evidence of hemorrhagic transformation. 2.  Smaller left-sided acute/subacute cerebellar infarct again noted. 3.  Mass effect on the cerebellar peduncles, without definite evidence of transtentorial herniation; no definite hydrocephalus seen. 4.  Scattered small vessel ischemic microangiopathy. 01/06/2012   1. Findings most consistent with large right cerebellar infarction which is acute / subacute.  2.  Mass effect in the posterior fossa with compression of the fourth ventricle and midline shift within the posterior fossa.   MRI of the brain large right cerebellar infarct with lateral brain stem infarct as well with mild mass effect on the fourth ventricle but no hydrocephalus  MRA of the brain  occluded right vertebral artery. Patent dominant left vertebral artery and basilar artery without significant stenosis.  2D Echocardiogram  EF 65-70% with no source of embolus.   Carotid Doppler  Bilateral: No evidence of hemodynamically significant internal carotid artery stenosis. Vertebral artery flow is antegrade. Right vertebral waveform is spiked.   CXR   01/07/2012  Mild bibasilar airspace opacities, right greater than left, raise concern for pneumonia, though aspiration or atelectasis could have a similar appearance.  Lungs mildly hypoexpanded. 01/06/2012  Low volume film with mild basilar atelectasis  EKG  normal sinus rhythm, left axis deviation.   Physical Exam    Awake alert. Afebrile. Head is nontraumatic. Neck is supple without bruit. Hearing is normal. Cardiac exam no murmur or gallop. Lungs are clear to auscultation. Distal pulses are well felt.   Neurologic Exam : Awake  Alert oriented x 3. Mildy dysarhtricspeech  But normal language.Eye movements full without nystagmus but mild dysmetric saccades.. Face symmetric. Tongue  midline. Normal strength, tone and  Reflexes.Impaired but improved right upper extremity coordination with minimal finger to nose dysmetria.. Normal sensation. Gait deferred.   ASSESSMENT Shane Juarez is a 78 y.o. male with a right cerebellar infarct with cytotoxic edema improving. 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 On no antiplatelets prior to admission. Now on aspirin 300 mg rectally every day for secondary stroke prevention.  Patient with improved headache, resultant slurred speech, right sided ataxia.  -hypertension -hyperglycemia -hyponatremia  Hospital day # 2  TREATMENT/PLAN -Change Aspirin to 325 po for secondary stroke prevention. -lovenox for VTE in the am if stable -resume home meds -transfer to the floor -OOB. Therapy evals. -Rehab consult   SHARON BIBY, AVNP, ANP-BC, GNP-BC   [facility] Pager: [phone] 01/08/2012 7:37 AM  Dr. Delia Heady, Stroke Center Medical Director, has personally reviewed chart, pertinent data, examined the patient and developed the plan of care.

## 2012-01-08 NOTE — PMR Pre-admission (Signed)
PMR Admission Coordinator Pre-Admission Assessment  Patient: Shane Juarez is an 78 y.o., male MRN: [id number] DOB: 08-08-1930 Height: 5\' 11"  (180.3 cm) Weight: 85.4 kg (188 lb 4.4 oz)  Insurance Information HMO:     PPO:      PCP:      IPA:      80/20: yes     OTHER: no hmo PRIMARY: Medicare a and b      Policy#: [id number]      Subscriber: pt CM Name:       Phone#:      Fax#:  Pre-Cert#:       Employer:  Benefits:  Phone #:  Vision share   Name: 01/08/12 Eff. Date:a 10/25/96 b 04/24/97      Deduct:   $1184    Out of Pocket NWG:NFAO       Life Max: none CIR: 100%      SNF: 20 full days LBD none Outpatient: 80%     Co-Pay: 20% Home Health: 100%      Co-Pay: 20% DME: 80%     Co-Pay: 20% Providers: pt choice  SECONDARY: none        Emergency Contact Information Contact Information    Name Relation Home Work Mobile   Nuzzo,Ethel Other [phone]       Current Medical History  Patient Admitting Diagnosis: Right cerebellar infarct with significant right hemiataxia  History of Present Illness: Shane Juarez is an 78 y.o. male with history of HTN, admitted on 04/14 with fall 3 days PTA with inability to walk. CCT with large acute/subacute right cerebellar infarct with mass effect compressing 4th ventricle. CT cervical spine with multilevel spondylosis. 2 D echo with severe LVH, EF 65-70%. Carotid dopplers without ICA stenosis. Neurology recommends ASA for CVA prophylaxis. Follow up MRI/MRA brain done today revealing R-PICA infarct with petechial hemorrhage, severe cerebellar edema with mass effect, occlusion of distal RVA-?dissection.  Cytotoxic edema improved. 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 Now on aspirin 325 mg every day for stroke prevention.  MBS 4/17 results pending  NIHSS Total: 1     Past Medical History  Past Medical History  Diagnosis Date  . Hypertension     Family History  family history includes Diabetes in his  sister and Heart attack in his sister.  Prior Rehab/Hospitalizations: none Current Medications  Current facility-administered medications:amLODipine (NORVASC) tablet 5 mg, 5 mg, Oral, Daily, Layne Benton, NP, 5 mg at 01/09/12 1105;  aspirin tablet 325 mg, 325 mg, Oral, Daily, Laveda Norman, MD, 325 mg at 01/09/12 1104;  enoxaparin (LOVENOX) injection 40 mg, 40 mg, Subcutaneous, Q24H, Layne Benton, NP, 40 mg at 01/09/12 1105 labetalol (NORMODYNE,TRANDATE) injection 20 mg, 20 mg, Intravenous, Q2H PRN, Storm Frisk, MD, 20 mg at 01/08/12 1853;  ondansetron (ZOFRAN) injection 4 mg, 4 mg, Intravenous, Q6H PRN, Laveda Norman, MD;  senna-docusate (Senokot-S) tablet 1 tablet, 1 tablet, Oral, QHS PRN, Laveda Norman, MD  Patients Current Diet: Dysphagia  4/16 SLP requesting MBS 4/17 and Dysphagia 3, nectar-thick liquids pending MBS. Patient observed coughing consistently when swallowing thin liquids, and coughing not eliminated with feeding modifications.Improved with nectar-thick liquids.  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  Heel Shin Test: pt with decreased speed on RLE and ROM, able to complete without inaccuracy    ADLs  Eating/Feeding: Performed;Set up Eating/Feeding Details (indicate cue type and reason): Pt eating breakfast with prolonged time to chew due to no dentures present for meals Where Assessed - Eating/Feeding: Bed level Grooming: Performed;Teeth care;Maximal assistance (Max (A) due to balance deficits Pt leaning to the Rt heavily) Grooming Details (indicate cue type and reason): Pt able to recognize lean to Rt and verbalizes. pt using mirror to self correct LOB to the right side. pt provided faciliation at hips and weight shift. Where Assessed - Grooming: Standing at sink Upper Body Dressing: Performed;Minimal assistance (Min (A) due to lines and leads ; doff gown supervision) Upper Body Dressing Details (indicate cue type and reason): pt with posture lean and LOB at EOB with BIL UE elevation this session.  Where Assessed - Upper Body Dressing: Sitting, chair;Unsupported Lower Body Dressing: Performed;Set up Where Assessed - Lower Body Dressing: Supine, head of bed up Toilet Transfer: Simulated;Maximal assistance (Max (A) due to balance deficits) Toilet Transfer Method: Proofreader: Raised toilet seat with arms (or 3-in-1 over toilet) Equipment Used: Gait belt;Rolling walker Ambulation Related to ADLs: Pt ambulated from EOB to sink To chair. Pt required Total +2 Pt 50% due to loss of balance to the right side and unable to correct without facilitation / weight shift by therapist. ADL Comments: Pt pleasant and cooperative. Pt performed oral care at sink level this session and will require (A) for all adls due to balance deficits. pt recognizes deficits and can direct care.    Mobility  Bed Mobility: Yes Supine to Sit: 5: Supervision;With rails;HOB elevated (Comment degrees) (HOB 20  degrees) Supine to Sit Details (indicate cue type and reason): Pt requries supervision for safety due to posterior lean while sitting at EOB.    Transfers  Transfers: Yes Sit to Stand: 1: +2 Total assist;From bed;With upper extremity assist (pt = 50%) Sit to Stand Details (indicate cue type and reason): Pt required +2 assist due to LOB posteriorly and to the right. Stand to Sit: 1: +2 Total assist;Patient percentage (comment);To chair/3-in-1 (pt = 50%) Stand to Sit Details: Pt required +2 assist due to uncoltrolled descent into chair with max VC.    Ambulation / Gait / Stairs / Wheelchair Mobility  Ambulation/Gait Ambulation/Gait: Yes Ambulation/Gait Assistance: 1: +2 Total assist;Patient percentage (comment) (pt = 50%) Ambulation/Gait Assistance Details (indicate cue type and reason): Pt required Total +2 assist due to loss of balance to the right side and unable to correct without facilitation/weight shift by therapist.  Pt ambulated from EOB to sink To chair. Ambulation Distance (Feet): 20 Feet Assistive device: Rolling walker Gait Pattern: Step-to pattern;Ataxic Gait velocity: Decreased Stairs: No    Posture / Balance Posture/Postural Control Posture/Postural Control: No significant limitations Static Sitting Balance Static Sitting - Balance Support: Feet supported;Bilateral upper extremity supported  Static Sitting - Level of Assistance: Other (comment) (Min guard) Static Sitting - Comment/# of Minutes: Pt with posterior and right lean upon initial sitting EOB. Static Standing Balance Static Standing - Balance Support: During functional activity;No upper extremity supported Static Standing - Level of Assistance: 3: Mod assist Static Standing - Comment/# of Minutes: Pt with right lean with standing at sink.  Pt aware of lean and able to self-correct with verbal cues but not able to maintain midline.  Pt demonstrated better balance with facilitation through trunk.     Previous Home  Environment Living Arrangements: Spouse/significant other Lives With: Spouse Type of Home: House Home Layout: One level Home Access: Level entry Bathroom Shower/Tub: Walk-in shower;Door (second bathroom has a seat) Firefighter: Standard Bathroom Accessibility: Yes How Accessible: Accessible via walker Home Care Services: No  Discharge Living Setting Plans for Discharge Living Setting: Patient's home;Lives with (comment) (spouse) Type of Home at Discharge: House Discharge Home Layout: One level;Full bath on main level Discharge Home Access: Level entry Discharge Bathroom Toilet: Standard Discharge Bathroom Accessibility: Yes How Accessible: Accessible via walker Do you have any problems obtaining your medications?: No  Social/Family/Support Systems Patient Roles: Spouse;Parent Contact Information: Latrelle Bazar, wife Anticipated Caregiver: wife Anticipated Caregiver's Contact Information: home [phone] Ability/Limitations of Caregiver: min assist Caregiver Availability: 24/7 Discharge Plan Discussed with Primary Caregiver: Yes Is Caregiver In Agreement with Plan?: Yes Does Caregiver/Family have Issues with Lodging/Transportation while Pt is in Rehab?: No  Goals/Additional Needs Patient/Family Goal for Rehab: supervision with PT, supervision to min assist OT, supervision to Mod I SLP Expected length of stay: ELOS 2 weeks Pt/Family Agrees to Admission and willing to participate: Yes Program Orientation Provided & Reviewed with Pt/Caregiver Including Roles  & Responsibilities: Yes  Patient Condition: This patient's condition remains as documented in the Consult dated 01/08/12, in which the Rehabilitation Physician determined and documented that the patient's condition is appropriate for intensive rehabilitative care in an inpatient rehabilitation facility.  Preadmission Screen Completed By:  Clois Dupes, 01/09/2012 11:54  AM ______________________________________________________________________   Discussed status with Dr. Riley Kill on 01/09/12 at 1155 and received telephone approval for admission today.  Admission Coordinator:  Clois Dupes, time 2956 Date 01/09/12.

## 2012-01-08 NOTE — Evaluation (Signed)
Speech Language Pathology Evaluation Patient Details Name: Shane Juarez MRN: [id number] DOB: August 30, 1930 Today's Date: 01/08/2012  Problem List:  Patient Active Problem List  Diagnoses  . CVA (cerebral vascular accident)  . HTN (hypertension)   Past Medical History:  Past Medical History  Diagnosis Date  . Hypertension    Past Surgical History: History reviewed. No pertinent past surgical history.  SLP Assessment/Plan/Recommendation Clinical Impression: Shane Juarez is a 78 y.o. 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 Presents with baseline cognitive-linguistic function based on evaluation and wife's report.  No SLP f/u warranted.     SLP Recommendation/Assessment: Patient does not need any further Speech Language Pathology Services for cognition SLP Recommendations Follow up Recommendations: None Equipment Recommended: Defer to next venue  SLP Goals   n/a  Isabellamarie Randa L. Samson [place], [place] CCC/SLP Pager [phone]  [name] 01/08/2012, 12:34 PM

## 2012-01-08 NOTE — Progress Notes (Signed)
UR COMPLETED  

## 2012-01-08 NOTE — Consult Note (Signed)
Physical Medicine and Rehabilitation Consult Reason for Consult: difficulty walking Referring Phsyician:  Dr. Pearlean Brownie    HPI: Shane Juarez is an 78 y.o. male with history of HTN, admitted on 04/14 with fall 3 days PTA with inability to walk.  CCT with large acute/subacute right cerebellar infarct with mass effect compressing 4th ventricle. CT cervical spine with multilevel spondylosis.  2 D echo with severe LVH, EF 65-70%. Carotid dopplers without ICA stenosis.  Neurology recommends ASA for CVA prophylaxis.  Follow up MRI/MRA brain done today revealing R-PICA infarct with petechial hemorrhage, severe cerebellar edema with mass effect, occlusion of distal RVA-?dissection.  Limited PT/OT evaluations done yesterday.  MD recommending CIR.  Review of Systems  HENT: Negative for hearing loss.   Eyes: Negative for blurred vision and double vision.  Respiratory: Negative for cough and shortness of breath.   Cardiovascular: Negative for chest pain and palpitations.  Gastrointestinal: Negative for heartburn, nausea and diarrhea.  Genitourinary: Negative for urgency and frequency.  Neurological: Negative for headaches.   Past Medical History  Diagnosis Date  . Hypertension    History reviewed. No pertinent past surgical history.  Family History  Problem Relation Age of Onset  . Diabetes Sister   . Heart attack Sister    Social History:  Married.  Still works partime doing Designer, television/film set on cars.  He quit smoking 30+ years ago. He does not have any smokeless tobacco history on file. He reports that he does not drink alcohol or use illicit drugs.   No Known Allergies  Prior to Admission medications   Medication Sig Start Date End Date Taking? Authorizing Provider  amLODipine (NORVASC) 5 MG tablet Take 5 mg by mouth daily.   Yes Historical Provider, MD  Probiotic Product (PROBIOTIC FORMULA PO) Take 1 tablet by mouth daily.   Yes Historical Provider, MD   Scheduled Medications:    . amLODipine  5 mg  Oral Daily  . aspirin  325 mg Oral Daily  . enoxaparin (LOVENOX) injection  40 mg Subcutaneous Q24H  . 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 pt required mIn (A) with shoulder flexion for balance Transfers Transfers: Yes Sit to Stand: 3: Mod assist;With upper extremity assist;From bed Sit to Stand Details (indicate cue type and reason): Mod assist for stability. VC for hand placement and anterior translation Stand to Sit: 2: Max assist;With upper extremity assist;To bed Stand to Sit Details: Max assist to control descent onto bed Ambulation/Gait Ambulation/Gait: No    ADL: ADL Eating/Feeding: Performed;Set up (observed with SLP ) Eating/Feeding Details (indicate cue type and reason): self feeding cracker Lt UE and drink cup in Rt UE Where Assessed - Eating/Feeding: Bed level Grooming: Simulated;Wash/dry hands;Wash/dry face;Minimal assistance Where Assessed - Grooming: Sitting, bed;Unsupported Lower Body Dressing: Performed;Set up Where Assessed - Lower  Body Dressing: Supine, head of bed up Ambulation Related to ADLs: n/a ADL Comments: Pt with limited mobility  allowed at this time per Dr. Pearlean Brownie. Pt supine<>sit EOB only. Pt with sensation deficits and motor planning deficits on Rt Side. Pt is right hand dominant.   Cognition: Cognition Arousal/Alertness: Awake/alert Orientation Level: Oriented X4 Cognition Arousal/Alertness: Awake/alert Overall Cognitive Status: Appears within functional limits for tasks assessed Orientation Level: Oriented X4  Blood pressure 160/81, pulse 84, temperature 98.3 F (36.8 C), temperature source Oral, resp. rate 16, height 4\' 11"  (1.499 m), weight 85.4 kg (188 lb 4.4 oz), SpO2 97.00%. Physical Exam  Nursing note and vitals reviewed. Constitutional: He is oriented to person, place, and time. He appears well-developed and well-nourished.  HENT:  Head: Normocephalic and atraumatic.  Eyes: Pupils are equal, round, and reactive to light.  Neck: Normal range of motion.  Cardiovascular: Normal rate and regular rhythm.   Pulmonary/Chest: Effort normal and breath sounds normal. No respiratory distress.  Abdominal: Soft. Bowel sounds are normal. He exhibits no distension. There is no tenderness.  Musculoskeletal: He exhibits no edema.  Neurological: He is alert and oriented to person, place, and time. Coordination (ataxia with right finger to nose.) abnormal.       Patient's speech is dysarthric but intelligible. He is right hemi-ataxia. Strength is fair at 4-5 out of 5 in all 4 limbs. Sensory exam is intact. Cognitions fairly appropriate.  Skin: Skin is warm and dry.  Psychiatric: He has a normal mood and affect. His behavior is normal. Judgment and thought content normal.    Results for orders placed during the hospital encounter of 01/06/12 (from the past 24 hour(s))  GLUCOSE, CAPILLARY     Status: Abnormal   Collection Time   01/07/12 12:18 PM      Component Value Range   Glucose-Capillary 130 (*) 70 - 99 (mg/dL)  GLUCOSE, CAPILLARY     Status: Abnormal   Collection Time   01/08/12  7:43 AM      Component Value  Range   Glucose-Capillary 138 (*) 70 - 99 (mg/dL)   Dg Chest 2 View  12/31/8117  *RADIOLOGY REPORT*  Clinical Data: Fall with weakness.  CHEST - 2 VIEW  Comparison: 07/24/2010 CT  Findings: This is a low volume film with mild bibasilar atelectasis. There is no evidence of focal airspace disease, pulmonary edema, suspicious pulmonary nodule/mass, pleural effusion, or pneumothorax. No acute bony abnormalities are identified.  IMPRESSION: Low volume film with mild basilar atelectasis.  Original Report Authenticated By: Rosendo Gros, M.D.   Ct Head Wo Contrast  01/07/2012  *RADIOLOGY REPORT*  Clinical Data: Follow up recent CVA; history of fall.  CT HEAD WITHOUT CONTRAST  Technique:  Contiguous axial images were obtained from the base of the skull through the vertex without contrast.  Comparison: CT of the head performed 01/06/2012  Findings:  There has been mild interval evolution of the patient's acute/subacute right cerebellar infarct, which again demonstrates mass effect, with slightly increased leftward midline shift at the posterior fossa, measuring approximately 1.2 cm.  There is compression of the fourth ventricle, as previously noted.  A smaller left-sided cerebellar infarct is again noted.  There is no evidence of hemorrhagic transformation.  There is mass effect on the cerebellar peduncles, without evidence of transtentorial herniation at this time.  Scattered periventricular and subcortical white matter change likely reflects small vessel ischemic microangiopathy.  No definite hydrocephalus is characterized.  The third and lateral ventricles, and basal ganglia  are unremarkable in appearance.  The cerebral hemispheres demonstrate grossly normal gray-white differentiation.  There is no evidence of fracture; visualized osseous structures are unremarkable in appearance.  The orbits are within normal limits. The paranasal sinuses and mastoid air cells are well-aerated.  No significant soft tissue  abnormalities are seen.  IMPRESSION:  1.  Mild interval evolution of acute/subacute right cerebellar infarct, with significant mass effect and slightly increased leftward midline shift at the posterior fossa, measuring approximately 1.2 cm.  Compression of the fourth ventricle again noted.  No evidence of hemorrhagic transformation. 2.  Smaller left-sided acute/subacute cerebellar infarct again noted. 3.  Mass effect on the cerebellar peduncles, without definite evidence of transtentorial herniation; no definite hydrocephalus seen. 4.  Scattered small vessel ischemic microangiopathy.  Original Report Authenticated By: Tonia Ghent, M.D.   Ct Head Wo Contrast  01/06/2012  *RADIOLOGY REPORT*  Clinical Data:  Fall, unsteady gait  CT HEAD WITHOUT CONTRAST CT CERVICAL SPINE WITHOUT CONTRAST  Technique:  Multidetector CT imaging of the head and cervical spine was performed following the standard protocol without intravenous contrast.  Multiplanar CT image reconstructions of the cervical spine were also generated.  Comparison: None  CT HEAD  Findings: There is a large region of hypoattenuation within the right cerebellum most consistent with infarction.  There is some mass effect with compression of the fourth ventricle suggesting edema. There is mild left or right midline shift the posterior fossa.  Quadrigeminal plate cistern is patent as well as the suprasellar cistern.  There is mild dilatation of the temporal horns.  No evidence of supra tentorial infarction.  There is periventricular white matter hypodensities.  No acute intracranial hemorrhage.  IMPRESSION:  1. Findings most consistent with large right cerebellar infarction which is acute / subacute.  2.  Mass effect in the posterior fossa with compression of the fourth ventricle and midline shift within the posterior fossa.  CT CERVICAL SPINE  Findings: No prevertebral soft tissue swelling.  Normal alignment of cervical vertebral bodies.  No loss of vertebral  body height. Normal facet articulation.  Normal craniocervical junction. There is endplate spurring at multiple levels and loss of disc space at C5-C6.  No evidence epidural or paraspinal hematoma.  Incidental note of a 15 mm right thyroid nodule.  IMPRESSION: 1.  No evidence of cervical spine fracture. 2.  Multilevel spondylosis. 3.  Thyroid nodule on the right. Consider further evaluation with thyroid ultrasound.  If patient is clinically hyperthyroid, consider nuclear medicine thyroid uptake and scan.  Findings conveyed to Dr. Rubin Payor on 01/06/2012 at 1220 hours  Original Report Authenticated By: Genevive Bi, M.D.   Ct Cervical Spine Wo Contrast  01/06/2012  *RADIOLOGY REPORT*  Clinical Data:  Fall, unsteady gait  CT HEAD WITHOUT CONTRAST CT CERVICAL SPINE WITHOUT CONTRAST  Technique:  Multidetector CT imaging of the head and cervical spine was performed following the standard protocol without intravenous contrast.  Multiplanar CT image reconstructions of the cervical spine were also generated.  Comparison: None  CT HEAD  Findings: There is a large region of hypoattenuation within the right cerebellum most consistent with infarction.  There is some mass effect with compression of the fourth ventricle suggesting edema. There is mild left or right midline shift the posterior fossa.  Quadrigeminal plate cistern is patent as well as the suprasellar cistern.  There is mild dilatation of the temporal horns.  No evidence of supra tentorial infarction.  There is periventricular white matter hypodensities.  No acute intracranial hemorrhage.  IMPRESSION:  1. Findings most consistent with large right cerebellar infarction which is acute / subacute.  2.  Mass effect in the posterior fossa with compression of the fourth ventricle and midline shift within the posterior fossa.  CT CERVICAL SPINE  Findings: No prevertebral soft tissue swelling.  Normal alignment of cervical vertebral bodies.  No loss of vertebral body  height. Normal facet articulation.  Normal craniocervical junction. There is endplate spurring at multiple levels and loss of disc space at C5-C6.  No evidence epidural or paraspinal hematoma.  Incidental note of a 15 mm right thyroid nodule.  IMPRESSION: 1.  No evidence of cervical spine fracture. 2.  Multilevel spondylosis. 3.  Thyroid nodule on the right. Consider further evaluation with thyroid ultrasound.  If patient is clinically hyperthyroid, consider nuclear medicine thyroid uptake and scan.  Findings conveyed to Dr. Rubin Payor on 01/06/2012 at 1220 hours  Original Report Authenticated By: Genevive Bi, M.D.   Mr Brain Wo Contrast  01/08/2012  *RADIOLOGY REPORT*  Clinical Data:  78 year old male who sustained blunt trauma with subsequent nausea, vomiting, difficulty with ambulation. Cerebellar infarct discovered on CT.  Comparison: Head CTs 01/07/2012, 01/06/2012.  MRI HEAD WITHOUT CONTRAST  Technique: Multiplanar, multiecho pulse sequences of the brain and surrounding structures were obtained according to standard protocol without intravenous contrast.  Findings: Confluent restricted diffusion throughout the inferior half of the right cerebellum.  Diffusion is restricted in the lateral right medulla oblongata (series 4 image 7).  However, the cervicomedullary junction and remainder the brain stem are spared. No left cerebellar or supratentorial restricted diffusion.  There is severe cerebellar edema with effacement of the fourth ventricle. There is associated petechial hemorrhage near the midline. There is curvilinear intrinsic T1 signal in the lateral aspect of the infarcted cerebellum which might be related to venous cortical occlusion.  Nearby, the flow void of the distal right transverse and sigmoid sinus is decreased.  The lateral and third ventricles are mildly prominent including the temporal horns, but no transependymal edema is evident.  Both distal vertebral artery flow voids are present,  although the right vertebral flow void is faint in the distal cervical spine (series 6 image 1) and shows some surrounding T2 hyperintensity. There are multiple chronic infarcts in the left cerebellum.  There is comparatively mild T2 heterogeneity in the brain stem.  There is mild to moderate nonspecific white matter T2 and FLAIR hyperintensity.  The deep gray matter nuclei are within normal limits.  The anterior circulation flow voids are preserved.  Partially empty sella configuration.  Visualized cervical spine is negative except for decreased T1 marrow signal in the odontoid, not correlated with a CT abnormality recently. Visualized orbit soft tissues are within normal limits.  Visualized paranasal sinuses and mastoids are clear.  Negative scalp soft tissues.  IMPRESSION: 1.  Large right PICA infarct with petechial hemorrhage and severe cerebellar edema. Mass effect in the posterior fossa and effacement of the fourth ventricle with mild lateral and third ventriculomegaly, but no transependymal edema at this time. 2.  See MRA findings below. Suggestion of slow flow or perhaps early occlusion of the right sigmoid sinus. 3.  Superimposed chronic left cerebellar infarcts.  Nonspecific mild to moderate cerebral white matter signal changes.  MRA HEAD WITHOUT CONTRAST  Technique: Angiographic images of the Circle of Willis were obtained using MRA technique without  intravenous contrast.  Findings: Absent antegrade flow in the distal right vertebral artery. No right PICA or significant right AICA flow signal.  Flow signal is preserved in the distal left vertebral artery which is moderately irregular and demonstrates mild stenosis beyond the patent left PICA origin. The basilar artery is patent.  No basilar stenosis.  Superior cerebellar artery and PCA origins are normal. The right posterior communicating artery is normal, the left is diminutive or absent.  There is mild to moderate right PCA P2 segment stenosis and left  PCA P3 the superior division stenosis. There is preserved distal flow on both sides.  Antegrade flow in both ICA siphons.  Mild to moderate ICA atherosclerosis with no hemodynamically significant stenosis. Normal ophthalmic and right posterior communicating artery origins. Normal carotid termini, MCA and ACA origins.  Normal anterior communicating artery.  Visualized ACA branches are within normal limits.  Visualized bilateral MCA branches are within normal limits.  IMPRESSION: 1.  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  No right PICA or AICA flow. 2.  Distal left vertebral artery is patent but demonstrates atherosclerosis and stenosis.  Mild to moderate bilateral PCA stenosis with preserved distal flow. 3.  ICA atherosclerosis without significant stenosis otherwise negative anterior circulation.  Findings discussed with critical care Dr. Sung Amabile by telephone at 0830 hours on 01/08/2012.  Original Report Authenticated By: Harley Hallmark, M.D.   Dg Chest Port 1 View  01/07/2012  *RADIOLOGY REPORT*  Clinical Data: Follow-up lung fields; recent CVA.  PORTABLE CHEST - 1 VIEW  Comparison: Chest radiograph performed 01/06/2012  Findings: The lungs are mildly hypoexpanded.  Mild bibasilar airspace opacities, right greater than left, raise concern for pneumonia, though aspiration or atelectasis could have a similar appearance.  There is no evidence of pleural effusion or pneumothorax.  The cardiomediastinal silhouette is within normal limits.  No acute osseous abnormalities are seen.  IMPRESSION: Mild bibasilar airspace opacities, right greater than left, raise concern for pneumonia, though aspiration or atelectasis could have a similar appearance.  Lungs mildly hypoexpanded.  Original Report Authenticated By: Tonia Ghent, M.D.   Mr Mra Head/brain Wo Cm  01/08/2012  *RADIOLOGY REPORT*  Clinical Data:  78 year old male who sustained  blunt trauma with subsequent nausea, vomiting, difficulty with ambulation. Cerebellar infarct discovered on CT.  Comparison: Head CTs 01/07/2012, 01/06/2012.  MRI HEAD WITHOUT CONTRAST  Technique: Multiplanar, multiecho pulse sequences of the brain and surrounding structures were obtained according to standard protocol without intravenous contrast.  Findings: Confluent restricted diffusion throughout the inferior half of the right cerebellum.  Diffusion is restricted in the lateral right medulla oblongata (series 4 image 7).  However, the cervicomedullary junction and remainder the brain stem are spared. No left cerebellar or supratentorial restricted diffusion.  There is severe cerebellar edema with effacement of the fourth ventricle. There is associated petechial hemorrhage near the midline. There is curvilinear intrinsic T1 signal in the lateral aspect of the infarcted cerebellum which might be related to venous cortical occlusion.  Nearby, the flow void of the distal right transverse and sigmoid sinus is decreased.  The lateral and third ventricles are mildly prominent including the temporal horns, but no transependymal edema is evident.  Both distal vertebral artery flow voids are present, although the right vertebral flow void is faint in the distal cervical spine (series 6 image 1) and shows some surrounding T2 hyperintensity. There are multiple chronic infarcts in the left cerebellum.  There is comparatively mild T2 heterogeneity in the brain stem.  There is mild to moderate nonspecific white matter T2 and FLAIR  hyperintensity.  The deep gray matter nuclei are within normal limits.  The anterior circulation flow voids are preserved.  Partially empty sella configuration.  Visualized cervical spine is negative except for decreased T1 marrow signal in the odontoid, not correlated with a CT abnormality recently. Visualized orbit soft tissues are within normal limits.  Visualized paranasal sinuses and mastoids  are clear.  Negative scalp soft tissues.  IMPRESSION: 1.  Large right PICA infarct with petechial hemorrhage and severe cerebellar edema. Mass effect in the posterior fossa and effacement of the fourth ventricle with mild lateral and third ventriculomegaly, but no transependymal edema at this time. 2.  See MRA findings below. Suggestion of slow flow or perhaps early occlusion of the right sigmoid sinus. 3.  Superimposed chronic left cerebellar infarcts.  Nonspecific mild to moderate cerebral white matter signal changes.  MRA HEAD WITHOUT CONTRAST  Technique: Angiographic images of the Circle of Willis were obtained using MRA technique without  intravenous contrast.  Findings: Absent antegrade flow in the distal right vertebral artery. No right PICA or significant right AICA flow signal.  Flow signal is preserved in the distal left vertebral artery which is moderately irregular and demonstrates mild stenosis beyond the patent left PICA origin. The basilar artery is patent.  No basilar stenosis.  Superior cerebellar artery and PCA origins are normal. The right posterior communicating artery is normal, the left is diminutive or absent.  There is mild to moderate right PCA P2 segment stenosis and left PCA P3 the superior division stenosis. There is preserved distal flow on both sides.  Antegrade flow in both ICA siphons.  Mild to moderate ICA atherosclerosis with no hemodynamically significant stenosis. Normal ophthalmic and right posterior communicating artery origins. Normal carotid termini, MCA and ACA origins.  Normal anterior communicating artery.  Visualized ACA branches are within normal limits.  Visualized bilateral MCA branches are within normal limits.  IMPRESSION: 1.  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  No right PICA or AICA flow. 2.  Distal left vertebral artery is patent but demonstrates atherosclerosis and stenosis.   Mild to moderate bilateral PCA stenosis with preserved distal flow. 3.  ICA atherosclerosis without significant stenosis otherwise negative anterior circulation.  Findings discussed with critical care Dr. Sung Amabile by telephone at 0830 hours on 01/08/2012.  Original Report Authenticated By: Harley Hallmark, M.D.    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 Does the need for close, 24 hr/day medical supervision in concert with the patient's rehab needs make it unreasonable for this patient to be served in a less intensive setting? Yes 2. Co-Morbidities requiring supervision/potential complications: Hypertension 3. Due to bladder management, bowel management, safety, skin/wound care, disease management, medication administration and patient education, does the patient require 24 hr/day rehab nursing? Yes 4. Does the patient require coordinated care of a physician, rehab nurse, PT (1-2 hrs/day, 5 days/week), OT (1-2 hrs/day, 5 days/week) and SLP (1-2 hrs/day, 5 days/week) to address physical and functional deficits in the context of the above medical diagnosis(es)? Yes Addressing deficits in the following areas: balance, endurance, locomotion, strength, transferring, bowel/bladder control, bathing, dressing, feeding, grooming, toileting, speech, swallowing and psychosocial support 5. Can the patient actively participate in an intensive therapy program of at least 3 hrs of therapy per day at least 5 days per week? Yes 6. The potential for patient to make measurable gains while on inpatient rehab is excellent 7. Anticipated  functional outcomes upon discharge from inpatients are supervision PT, supervision to minimal assistance OT, supervision to modified independent SLP 8. Estimated rehab length of stay to reach the above functional goals is: 2+ week 9. Does the patient have adequate social supports to accommodate these discharge functional goals?  Yes 10. Anticipated D/C setting: Home 11. Anticipated post D/C treatments: HH therapy 12. Overall Rehab/Functional Prognosis: excellent  RECOMMENDATIONS: This patient's condition is appropriate for continued rehabilitative care in the following setting: CIR Patient has agreed to participate in recommended program. Yes Note that insurance prior authorization may be required for reimbursement for recommended care.  Comment:   Ranelle Oyster M.D. 01/08/2012

## 2012-01-08 NOTE — Progress Notes (Signed)
Physical Therapy Treatment Patient Details Name: Shane Juarez MRN: [id number] DOB: 09-13-1930 Today's Date: 01/08/2012  PT Assessment/Plan  PT - Assessment/Plan Comments on Treatment Session: Pt able to ambulate and perform self-care ADLs at sink.  Pt demonstrated consistent right lean with standing that requires facilitation through the trunk to maintain midline.  Pt aware of lean and deficits. PT Plan: Discharge plan needs to be updated;Frequency remains appropriate PT Frequency: Min 4X/week Follow Up Recommendations: Inpatient Rehab Equipment Recommended: Defer to next venue PT Goals  Acute Rehab PT Goals PT Goal Formulation: With patient Time For Goal Achievement: 2 weeks Pt will go Supine/Side to Sit: with modified independence;with rail PT Goal: Supine/Side to Sit - Progress: Goal set today Pt will go Sit to Supine/Side: with modified independence;with rail PT Goal: Sit to Supine/Side - Progress: Goal set today Pt will go Sit to Stand: with modified independence PT Goal: Sit to Stand - Progress: Progressing toward goal Pt will go Stand to Sit: with modified independence PT Goal: Stand to Sit - Progress: Progressing toward goal Pt will Ambulate: >150 feet;with supervision;with least restrictive assistive device PT Goal: Ambulate - Progress: Progressing toward goal  PT Treatment Precautions/Restrictions  Precautions Precautions: Fall Restrictions Weight Bearing Restrictions: No Mobility (including Balance) Bed Mobility Bed Mobility: Yes Supine to Sit: 5: Supervision;With rails;HOB elevated (Comment degrees) (HOB 20 degrees) Supine to Sit Details (indicate cue type and reason): Pt requries supervision for safety due to posterior lean while sitting at EOB. Transfers Transfers: Yes Sit to Stand: 1: +2 Total assist;From bed;With upper extremity assist (pt = 50%) Sit to Stand Details (indicate cue type and reason): Pt required +2 assist due to LOB posteriorly and to the  right. Stand to Sit: 1: +2 Total assist;Patient percentage (comment);To chair/3-in-1 (pt = 50%) Stand to Sit Details: Pt required +2 assist due to uncontrolled descent into chair with max VC. Ambulation/Gait Ambulation/Gait: Yes Ambulation/Gait Assistance: 1: +2 Total assist;Patient percentage (comment) (pt = 50%) Ambulation/Gait Assistance Details (indicate cue type and reason): Pt required Total +2 assist due to loss of balance to the right side and unable to correct without facilitation/weight shift by therapist.  Pt ambulated from EOB to sink To chair. Ambulation Distance (Feet): 20 Feet Assistive device: Rolling walker Gait Pattern: Step-to pattern;Ataxic Gait velocity: Decreased Stairs: No  Posture/Postural Control Posture/Postural Control: No significant limitations Balance Balance Assessed: Yes Static Sitting Balance Static Sitting - Balance Support: Feet supported;Bilateral upper extremity supported Static Sitting - Level of Assistance: Other (comment) (Min guard) Static Sitting - Comment/# of Minutes: Pt with posterior and right lean upon initial sitting EOB. Static Standing Balance Static Standing - Balance Support: During functional activity;No upper extremity supported Static Standing - Level of Assistance: 3: Mod assist Static Standing - Comment/# of Minutes: Pt with right lean with standing at sink.  Pt aware of lean and able to self-correct with verbal cues but not able to maintain midline.  Pt demonstrated better balance with facilitation through trunk. Exercise    End of Session PT - End of Session Equipment Utilized During Treatment: Gait belt Activity Tolerance: Patient tolerated treatment well Patient left: in chair;with call bell in reach General Behavior During Session: [facility] for tasks performed Cognition: [facility] for tasks performed  Ezzard Standing SPT 01/08/2012, 12:47 PM  Jake Shark, PT DPT [phone]

## 2012-01-08 NOTE — Progress Notes (Signed)
I met with patient and then contacted his wife to discuss inpatient acute rehab venue. Both are in agreement to admission and I have a bed available tomorrow. I will follow up in the morning. Please call with any questions. [phone]

## 2012-01-08 NOTE — Progress Notes (Signed)
Speech/language Pathology  Returned today to complete cognitive/linguistic eval.  Pt eating lunch upon entry to room and observed to be coughing consistently when swallowing thin liquids; coughing not eliminated with feeding modifications.  Improved performance with nectar-thick liquids.  Pt evaluated by this clinician 4/15 and found to be tolerating POs well; no f/u was recommended at that time.  PLAN:  1.  Please order MBS 4/17 to elucidate source of dysphagia for Shane Juarez. 2.  Please order Dysphagia 3, nectar-thick liquids pending MBS.  Thank you, Emir Nack L. Samson Frederic, [place] CCC/SLP Pager [phone]

## 2012-01-08 NOTE — Progress Notes (Signed)
Occupational Therapy Treatment Patient Details Name: Shane Juarez MRN: [id number] DOB: 08-14-1930 Today's Date: 01/08/2012  OT Assessment/Plan OT Assessment/Plan Comments on Treatment Session: Pt is an excellent rehab candidate due to balance deficits and Rt side weakness.  OT Plan: Discharge plan needs to be updated OT Frequency: Min 2X/week Recommendations for Other Services: Rehab consult Follow Up Recommendations: Inpatient Rehab Equipment Recommended: Defer to next venue OT Goals Acute Rehab OT Goals OT Goal Formulation: With patient/family Time For Goal Achievement: 2 weeks ADL Goals Pt Will Perform Grooming: with set-up;Standing at sink ADL Goal: Grooming - Progress: Progressing toward goals Pt Will Perform Upper Body Bathing: with set-up;Sit to stand from bed;Sit to stand from chair ADL Goal: Upper Body Bathing - Progress: Progressing toward goals Pt Will Perform Lower Body Bathing: with set-up;Sit to stand from chair;Sit to stand from bed Pt Will Perform Upper Body Dressing: with set-up;Sit to stand from chair;Sit to stand from bed ADL Goal: Upper Body Dressing - Progress: Progressing toward goals Pt Will Perform Lower Body Dressing: with set-up;Sit to stand from chair;Sit to stand from bed Pt Will Transfer to Toilet: with min assist;3-in-1 ADL Goal: Toilet Transfer - Progress: Progressing toward goals Pt Will Perform Toileting - Clothing Manipulation: with set-up;Sitting on 3-in-1 or toilet ADL Goal: Toileting - Clothing Manipulation - Progress: Progressing toward goals Pt Will Perform Toileting - Hygiene: with set-up;Sit to stand from 3-in-1/toilet  OT Treatment Precautions/Restrictions  Precautions Precautions: Fall   ADL ADL Eating/Feeding: Performed;Set up Eating/Feeding Details (indicate cue type and reason): Pt eating breakfast with prolonged time to chew due to no dentures present for meals Where Assessed - Eating/Feeding: Bed level Grooming: Performed;Teeth  care;Maximal assistance (Max (A) due to balance deficits Pt leaning to the Rt heavily) Grooming Details (indicate cue type and reason): Pt able to recognize lean to Rt and verbalizes. pt using mirror to self correct LOB to the right side. pt provided faciliation at hips and weight shift. Where Assessed - Grooming: Standing at sink Upper Body Dressing: Performed;Minimal assistance (Min (A) due to lines and leads ; doff gown supervision) Upper Body Dressing Details (indicate cue type and reason): pt with posture lean and LOB at EOB with BIL UE elevation this session.  Where Assessed - Upper Body Dressing: Sitting, chair;Unsupported Toilet Transfer: Simulated;Maximal assistance (Max (A) due to balance deficits) Toilet Transfer Method: Proofreader: Raised toilet seat with arms (or 3-in-1 over toilet) Equipment Used: Gait belt;Rolling walker Ambulation Related to ADLs: Pt ambulated from EOB to sink To chair. Pt required Total +2 Pt 50% due to loss of balance to the right side and unable to correct without facilitation / weight shift by therapist. ADL Comments: Pt pleasant and cooperative. Pt performed oral care at sink level this session and will require (A) for all adls due to balance deficits. pt recognizes deficits and can direct care. Mobility  Bed Mobility Bed Mobility: Yes Supine to Sit: 5: Supervision;With rails;HOB elevated (Comment degrees) (HOB 20 degrees) Supine to Sit Details (indicate cue type and reason): Pt with posterior lean at eob  Transfers Transfers: Yes Sit to Stand: 1: +2 Total assist;With upper extremity assist;From bed (pt 50%) Sit to Stand Details (indicate cue type and reason): pt with Lob to the right and posteriorly Stand to Sit: 1: +2 Total assist;Patient percentage (comment);To chair/3-in-1 (50%) Stand to Sit Details: pt with uncontrolled descend to chair and required max v/c.  Exercises    End of Session OT - End of Session Equipment  Utilized During Treatment: Gait belt Activity Tolerance: Patient tolerated treatment well Patient left: with call bell in reach;in chair Nurse Communication: Mobility status for transfers;Mobility status for ambulation General Behavior During Session: [facility] for tasks performed Cognition: [facility] for tasks performed  Lucile Shutters  01/08/2012, 12:12 PM Pager: [phone]

## 2012-01-09 ENCOUNTER — Inpatient Hospital Stay (HOSPITAL_COMMUNITY): Payer: Medicare Other

## 2012-01-09 ENCOUNTER — Inpatient Hospital Stay (HOSPITAL_COMMUNITY)
Admission: RE | Admit: 2012-01-09 | Discharge: 2012-01-29 | DRG: 945 | Disposition: A | Discharge status: Skilled Nursing Facility | Payer: Medicare Other | Source: Ambulatory Visit | Attending: Physical Medicine & Rehabilitation | Admitting: Physical Medicine & Rehabilitation

## 2012-01-09 DIAGNOSIS — Z87891 Personal history of nicotine dependence: Secondary | ICD-10-CM

## 2012-01-09 DIAGNOSIS — Z7982 Long term (current) use of aspirin: Secondary | ICD-10-CM

## 2012-01-09 DIAGNOSIS — I639 Cerebral infarction, unspecified: Secondary | ICD-10-CM | POA: Diagnosis present

## 2012-01-09 DIAGNOSIS — I6330 Cerebral infarction due to thrombosis of unspecified cerebral artery: Secondary | ICD-10-CM

## 2012-01-09 DIAGNOSIS — R7301 Impaired fasting glucose: Secondary | ICD-10-CM

## 2012-01-09 DIAGNOSIS — Z5189 Encounter for other specified aftercare: Secondary | ICD-10-CM

## 2012-01-09 DIAGNOSIS — Z79899 Other long term (current) drug therapy: Secondary | ICD-10-CM

## 2012-01-09 DIAGNOSIS — R471 Dysarthria and anarthria: Secondary | ICD-10-CM

## 2012-01-09 DIAGNOSIS — I6350 Cerebral infarction due to unspecified occlusion or stenosis of unspecified cerebral artery: Secondary | ICD-10-CM

## 2012-01-09 DIAGNOSIS — R1310 Dysphagia, unspecified: Secondary | ICD-10-CM

## 2012-01-09 DIAGNOSIS — Z8249 Family history of ischemic heart disease and other diseases of the circulatory system: Secondary | ICD-10-CM

## 2012-01-09 DIAGNOSIS — I10 Essential (primary) hypertension: Secondary | ICD-10-CM

## 2012-01-09 DIAGNOSIS — Z833 Family history of diabetes mellitus: Secondary | ICD-10-CM

## 2012-01-09 DIAGNOSIS — E538 Deficiency of other specified B group vitamins: Secondary | ICD-10-CM

## 2012-01-09 LAB — GLUCOSE, CAPILLARY
Glucose-Capillary: 131 mg/dL — ABNORMAL HIGH (ref 70–99)
Glucose-Capillary: 172 mg/dL — ABNORMAL HIGH (ref 70–99)
Glucose-Capillary: 164 mg/dL — ABNORMAL HIGH (ref 70–99)
Glucose-Capillary: 159 mg/dL — ABNORMAL HIGH (ref 70–99)

## 2012-01-09 MED ORDER — DIPHENHYDRAMINE HCL 12.5 MG/5ML PO ELIX
12.5000 mg | ORAL_SOLUTION | Freq: Four times a day (QID) | ORAL | Status: DC | PRN
  Filled 2012-01-09: qty 10

## 2012-01-09 MED ORDER — POLYETHYLENE GLYCOL 3350 17 G PO PACK
17.0000 g | PACK | Freq: Every day | ORAL | Status: DC | PRN
  Administered 2012-01-10: 17 g via ORAL
  Filled 2012-01-09: qty 1

## 2012-01-09 MED ORDER — ASPIRIN 325 MG PO TABS
325.0000 mg | ORAL_TABLET | Freq: Every day | ORAL | Status: DC
  Administered 2012-01-10 – 2012-01-29 (×20): 325 mg via ORAL
  Filled 2012-01-09 (×23): qty 1

## 2012-01-09 MED ORDER — INSULIN ASPART 100 UNIT/ML ~~LOC~~ SOLN: 0.0000 [IU] | Freq: Every day | SUBCUTANEOUS | Status: DC

## 2012-01-09 MED ORDER — ALUM & MAG HYDROXIDE-SIMETH 200-200-20 MG/5ML PO SUSP: 30.0000 mL | ORAL | Status: DC | PRN

## 2012-01-09 MED ORDER — PROMETHAZINE HCL 12.5 MG RE SUPP: 12.5000 mg | Freq: Four times a day (QID) | RECTAL | Status: DC | PRN

## 2012-01-09 MED ORDER — TRAZODONE HCL 50 MG PO TABS: 25.0000 mg | ORAL_TABLET | Freq: Every evening | ORAL | Status: DC | PRN

## 2012-01-09 MED ORDER — SODIUM CHLORIDE 0.9 % IV SOLN
Freq: Two times a day (BID) | INTRAVENOUS | Status: DC
  Administered 2012-01-09 – 2012-01-14 (×6): via INTRAVENOUS

## 2012-01-09 MED ORDER — AMLODIPINE BESYLATE 5 MG PO TABS
5.0000 mg | ORAL_TABLET | Freq: Every day | ORAL | Status: DC
  Administered 2012-01-10 – 2012-01-16 (×7): 5 mg via ORAL
  Filled 2012-01-09 (×8): qty 1

## 2012-01-09 MED ORDER — FLEET ENEMA 7-19 GM/118ML RE ENEM
1.0000 | ENEMA | Freq: Once | RECTAL | Status: AC | PRN
  Filled 2012-01-09: qty 1

## 2012-01-09 MED ORDER — GUAIFENESIN-DM 100-10 MG/5ML PO SYRP: 5.0000 mL | ORAL_SOLUTION | Freq: Four times a day (QID) | ORAL | Status: DC | PRN

## 2012-01-09 MED ORDER — BISACODYL 10 MG RE SUPP
10.0000 mg | Freq: Every day | RECTAL | Status: DC | PRN
  Administered 2012-01-11: 10 mg via RECTAL
  Filled 2012-01-09 (×2): qty 1

## 2012-01-09 MED ORDER — PROMETHAZINE HCL 25 MG/ML IJ SOLN: 12.5000 mg | Freq: Four times a day (QID) | INTRAMUSCULAR | Status: DC | PRN

## 2012-01-09 MED ORDER — ENOXAPARIN SODIUM 40 MG/0.4ML ~~LOC~~ SOLN
40.0000 mg | Freq: Every day | SUBCUTANEOUS | Status: DC
  Administered 2012-01-10 – 2012-01-28 (×19): 40 mg via SUBCUTANEOUS
  Filled 2012-01-09 (×21): qty 0.4

## 2012-01-09 MED ORDER — ACETAMINOPHEN 325 MG PO TABS: 325.0000 mg | ORAL_TABLET | ORAL | Status: DC | PRN

## 2012-01-09 MED ORDER — PROMETHAZINE HCL 12.5 MG PO TABS: 12.5000 mg | ORAL_TABLET | Freq: Four times a day (QID) | ORAL | Status: DC | PRN

## 2012-01-09 MED ORDER — INSULIN ASPART 100 UNIT/ML ~~LOC~~ SOLN
0.0000 [IU] | Freq: Three times a day (TID) | SUBCUTANEOUS | Status: DC
  Administered 2012-01-10: 2 [IU] via SUBCUTANEOUS
  Administered 2012-01-10: 1 [IU] via SUBCUTANEOUS
  Administered 2012-01-10 – 2012-01-11 (×2): 2 [IU] via SUBCUTANEOUS

## 2012-01-09 NOTE — Progress Notes (Signed)
Stroke Team Progress Note  HISTORY Shane Juarez is an 78 y.o. male who was unable to walk since Wednesday 01/02/2012. He presented 01/06/2012.  Denies headaches or nausea. Also had difficulty with using his right arm. NIHSS of 2. 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 Due to size of CVA pt will need ICU/SDU bed. Patient was not a TPA candidate secondary to delay in arrival. He was admitted to the neuro ICU for further evaluation and treatment.  SUBJECTIVE Patient just back from MBSS - was done this am after ST evaluated coughing with breakfast. Family at bedside. Plans for rehab today.  OBJECTIVE Most recent Vital Signs: Filed Vitals:   01/08/12 1934 01/08/12 2124 01/09/12 0218 01/09/12 0610  BP: 163/81 164/93 159/77 160/89  Pulse: 80 77 84 83  Temp:  98.1 F (36.7 C) 98.4 F (36.9 C) 98.5 F (36.9 C)  TempSrc:  Oral Oral Oral  Resp:  18 18 18   Height:      Weight:      SpO2:  100% 100% 98%   CBG (last 3)   Basename 01/09/12 1138 01/09/12 0708 01/08/12 2151  GLUCAP 172* 131* 180*   Intake/Output from previous day: 04/16 0701 - 04/17 0700 In: 345 [P.O.:120; I.V.:225] Out: -   IV Fluid Intake:    MEDICATIONS     . amLODipine  5 mg Oral Daily  . aspirin  325 mg Oral Daily  . enoxaparin (LOVENOX) injection  40 mg Subcutaneous Q24H   PRN:  labetalol, ondansetron (ZOFRAN) IV, senna-docusate  Diet:  Dysphagia 3 nectar thick liquids Activity:  Up to bedside DVT Prophylaxis:  SCDs   CLINICALLY SIGNIFICANT STUDIES CBC    Component Value Date/Time   WBC 7.0 01/07/2012 0730   RBC 4.13* 01/07/2012 0730   HGB 13.4 01/07/2012 0730   HCT 39.1 01/07/2012 0730   PLT 225 01/07/2012 0730   MCV 94.7 01/07/2012 0730   MCH 32.4 01/07/2012 0730   MCHC 34.3 01/07/2012 0730   RDW 13.1 01/07/2012 0730   LYMPHSABS 1.8 01/06/2012 1215   MONOABS 0.8 01/06/2012 1215   EOSABS 0.0 01/06/2012 1215   BASOSABS 0.0 01/06/2012 1215   CMP    Component Value Date/Time   NA 137 01/07/2012 0730   K 4.2 01/07/2012 0730   CL 102 01/07/2012 0730   CO2 25 01/07/2012 0730   GLUCOSE 141* 01/07/2012 0730   BUN 13 01/07/2012 0730   CREATININE 1.02 01/07/2012 0730   CALCIUM 8.8 01/07/2012 0730   PROT 7.5 01/06/2012 1215   ALBUMIN 3.6 01/06/2012 1215   AST 25 01/06/2012 1215   ALT 13 01/06/2012 1215   ALKPHOS 89 01/06/2012 1215   BILITOT 0.6 01/06/2012 1215   GFRNONAA 67* 01/07/2012 0730   GFRAA 78* 01/07/2012 0730   COAGS Lab Results  Component Value Date   INR 0.98 01/06/2012   Lipid Panel     Component Value Date/Time   CHOL 136 01/07/2012 0730   TRIG 75 01/07/2012 0730   HDL 63 01/07/2012 0730   CHOLHDL 2.2 01/07/2012 0730   VLDL 15 01/07/2012 0730   LDLCALC 58 01/07/2012 0730   HgbA1C  Lab Results  Component Value Date   HGBA1C 6.3* 01/07/2012   Cardiac Panel (last 3 results) No results found for this basename: CKTOTAL:3,CKMB:3,TROPONINI:3,RELINDX:3 in the last 72 hours Urinalysis    Component Value Date/Time   COLORURINE YELLOW 01/06/2012 1335   APPEARANCEUR CLEAR 01/06/2012 1335   LABSPEC 1.022 01/06/2012 1335   PHURINE  6.0 01/06/2012 1335   GLUCOSEU 100* 01/06/2012 1335   HGBUR NEGATIVE 01/06/2012 1335   BILIRUBINUR NEGATIVE 01/06/2012 1335   KETONESUR 15* 01/06/2012 1335   PROTEINUR NEGATIVE 01/06/2012 1335   UROBILINOGEN 0.2 01/06/2012 1335   NITRITE NEGATIVE 01/06/2012 1335   LEUKOCYTESUR NEGATIVE 01/06/2012 1335   Urine Drug Screen  No results found for this basename: labopia,  cocainscrnur,  labbenz,  amphetmu,  thcu,  labbarb    Alcohol Level No results found for this basename: eth   Ct Cervical Spine  01/06/2012  1.  No evidence of cervical spine fracture. 2.  Multilevel spondylosis. 3.  Thyroid nodule on the right. Consider further evaluation with thyroid ultrasound.  If patient is clinically hyperthyroid, consider nuclear medicine thyroid uptake and scan.    CT of the brain   01/07/2012  1.  Mild interval evolution of acute/subacute right cerebellar infarct, with significant  mass effect and slightly increased leftward midline shift at the posterior fossa, measuring approximately 1.2 cm.  Compression of the fourth ventricle again noted.  No evidence of hemorrhagic transformation. 2.  Smaller left-sided acute/subacute cerebellar infarct again noted. 3.  Mass effect on the cerebellar peduncles, without definite evidence of transtentorial herniation; no definite hydrocephalus seen. 4.  Scattered small vessel ischemic microangiopathy. 01/06/2012   1. Findings most consistent with large right cerebellar infarction which is acute / subacute.  2.  Mass effect in the posterior fossa with compression of the fourth ventricle and midline shift within the posterior fossa.   MRI of the brain large right cerebellar infarct with lateral brain stem infarct as well with mild mass effect on the fourth ventricle but no hydrocephalus  MRA of the brain  occluded right vertebral artery. Patent dominant left vertebral artery and basilar artery without significant stenosis.  2D Echocardiogram  EF 65-70% with no source of embolus.   Carotid Doppler  Bilateral: No evidence of hemodynamically significant internal carotid artery stenosis. Vertebral artery flow is antegrade. Right vertebral waveform is spiked.   CXR   01/07/2012  Mild bibasilar airspace opacities, right greater than left, raise concern for pneumonia, though aspiration or atelectasis could have a similar appearance.  Lungs mildly hypoexpanded. 01/06/2012  Low volume film with mild basilar atelectasis  EKG  normal sinus rhythm, left axis deviation.   Physical Exam    Awake alert. Afebrile. Head is nontraumatic. Neck is supple without bruit. Hearing is normal. Cardiac exam no murmur or gallop. Lungs are clear to auscultation. Distal pulses are well felt.   Neurologic Exam : Awake  Alert oriented x 3. Mildy dysarhtricspeech  But normal language.Eye movements full without nystagmus but mild dysmetric saccades.. Face symmetric. Tongue  midline. Normal strength, tone and  Reflexes.Impaired but improved right upper extremity coordination with minimal finger to nose dysmetria.. Normal sensation. Gait deferred.   ASSESSMENT Mr. ERNEST POPOWSKI is a 78 y.o. male with a right cerebellar infarct with cytotoxic edema improving. 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 On no antiplatelets prior to admission. Now on aspirin 325 mg orally every day for secondary stroke prevention.  Patient with improved headache, resultant slurred speech, right sided ataxia.  -hypertension -hyperglycemia -hyponatremia  Hospital day # 3  TREATMENT/PLAN -transfer to CIR today -change to D3 nectar thick liquids -F/U as outpatient 2 months.  Joaquin Music, ANP-BC, GNP-BC [facility] Pager: [phone] 01/09/2012 11:50 AM  Dr. Delia Heady, Stroke Center Medical Director, has personally reviewed chart, pertinent data, examined  the patient and developed the plan of care.

## 2012-01-09 NOTE — Progress Notes (Signed)
Speech Pathology Note  Received verbal order per Annie Main, N.P to proceed with a MBSS due to yesterday's skilled clinical observation of a likely change in swallow function. Aware of possible transfer to CIR today.  Will complete MBSS by 1030 with results/recommendations for CIR therapists.  Myra Rude, M.S.,CCC-SLP Pager [phone]

## 2012-01-09 NOTE — Discharge Summary (Signed)
Stroke Discharge Summary  Patient ID: Shane Juarez   MRN: [id number]      DOB: 02-10-1930  Date of Admission: 01/06/2012 Date of Discharge: 01/09/2012  Attending Physician:  Darcella Cheshire, MD, Stroke MD  Consulting Physician(s):    Shan Levans, MD (PCCM), Faith Rogue, MD (PM&R)  Patient's PCP:  No primary provider on file.  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  Active Problems:  HTN (hypertension)  Dysphagia, dysarthria, left hemiparesis, ataxia secondary to stroke  Headache, resolved  Hyperglycemia, resolved  Hyponatremia, resolved  BMI  Body mass index is 26.26 kg/(m^2).   Past Medical History  Diagnosis Date  . Hypertension    History reviewed. No pertinent past surgical history.  Medications to be continued on Rehab   . amLODipine  5 mg Oral Daily  . aspirin  325 mg Oral Daily  . enoxaparin (LOVENOX) injection  40 mg Subcutaneous Q24H   LABORATORY STUDIES CBC    Component Value Date/Time   WBC 7.0 01/07/2012 0730   RBC 4.13* 01/07/2012 0730   HGB 13.4 01/07/2012 0730   HCT 39.1 01/07/2012 0730   PLT 225 01/07/2012 0730   MCV 94.7 01/07/2012 0730   MCH 32.4 01/07/2012 0730   MCHC 34.3 01/07/2012 0730   RDW 13.1 01/07/2012 0730   LYMPHSABS 1.8 01/06/2012 1215   MONOABS 0.8 01/06/2012 1215   EOSABS 0.0 01/06/2012 1215   BASOSABS 0.0 01/06/2012 1215   CMP    Component Value Date/Time   NA 137 01/07/2012 0730   K 4.2 01/07/2012 0730   CL 102 01/07/2012 0730   CO2 25 01/07/2012 0730   GLUCOSE 141* 01/07/2012 0730   BUN 13 01/07/2012 0730   CREATININE 1.02 01/07/2012 0730   CALCIUM 8.8 01/07/2012 0730   PROT 7.5 01/06/2012 1215   ALBUMIN 3.6 01/06/2012 1215   AST 25 01/06/2012 1215   ALT 13 01/06/2012 1215   ALKPHOS 89 01/06/2012 1215   BILITOT 0.6 01/06/2012 1215   GFRNONAA 67* 01/07/2012 0730   GFRAA 78* 01/07/2012 0730   COAGS Lab Results  Component Value Date   INR 0.98 01/06/2012   Lipid Panel    Component Value Date/Time   CHOL 136 01/07/2012 0730   TRIG 75 01/07/2012 0730   HDL 63 01/07/2012 0730   CHOLHDL 2.2 01/07/2012 0730   VLDL 15 01/07/2012 0730   LDLCALC 58 01/07/2012 0730   HgbA1C  Lab Results  Component Value Date   HGBA1C 6.3* 01/07/2012   Urine Drug Screen  No results found for this basename: labopia, cocainscrnur, labbenz, amphetmu, thcu, labbarb    Alcohol Level No results found for this basename: eth   SIGNIFICANT DIAGNOSTIC STUDIES Ct Cervical Spine 01/06/2012 1. No evidence of cervical spine fracture. 2. Multilevel spondylosis. 3. Thyroid nodule on the right. Consider further evaluation with thyroid ultrasound. If patient is clinically hyperthyroid, consider nuclear medicine thyroid uptake and scan.  CT of the brain  01/07/2012 1. Mild interval evolution of acute/subacute right cerebellar infarct, with significant mass effect and slightly increased leftward midline shift at the posterior fossa, measuring approximately 1.2 cm. Compression of the fourth ventricle again noted. No evidence of hemorrhagic transformation. 2. Smaller left-sided acute/subacute cerebellar infarct again noted. 3. Mass effect on the cerebellar peduncles, without definite evidence of transtentorial herniation; no definite hydrocephalus seen. 4. Scattered small vessel ischemic  microangiopathy.  01/06/2012 1. Findings most consistent with large right cerebellar infarction which is acute / subacute. 2. Mass effect in the posterior fossa with compression of the fourth ventricle and midline shift within the posterior fossa.  MRI of the brain large right cerebellar infarct with lateral brain stem infarct as well with mild mass effect on the fourth ventricle but no hydrocephalus  MRA of the brain occluded right vertebral artery. Patent dominant left vertebral artery and basilar artery without  significant stenosis.  2D Echocardiogram EF 65-70% with no source of embolus.  Carotid Doppler Bilateral: No evidence of hemodynamically significant internal carotid artery stenosis. Vertebral artery flow is antegrade. Right vertebral waveform is spiked.  CXR  01/07/2012 Mild bibasilar airspace opacities, right greater than left, raise concern for pneumonia, though aspiration or atelectasis could have a similar appearance. Lungs mildly hypoexpanded.  01/06/2012 Low volume film with mild basilar atelectasis  EKG normal sinus rhythm, left axis deviation.   History of Present Illness   Shane Juarez is an 78 y.o. male who was unable to walk since Wednesday 01/02/2012. He presented 01/06/2012. Denies headaches or nausea. Also had difficulty with using his right arm. NIHSS of 2. 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 Due to size of CVA pt will need ICU/SDU bed. Patient was not a TPA candidate secondary to delay in arrival. Due to risk of neuro worsening from his cerebellar infarct, he was admitted to the neuro ICU for further evaluation and treatment.  Hospital Course  MRI confirmed a large ischemic infarct in the right cerebellum, left cerebellum and lateral brain stem with mild mass effect on the 4th ventricle, but no hydrocephalus. Follow up CT showed increasing cerebral edema with midline shift, but no hydrocephalus or herniation. He remained in the NICU for 2 days before stabilizing and transferred to the floor.  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 He was started on  aspirin 325 mg orally every day for secondary stroke prevention.  Patient had complaints of headache secondary to his stroke which resolved. He has vascular risk factors of hypertension. His home medications were resumed. He may need additional BP medication in the future.  Physical therapy, occupational therapy and speech therapy evaluated patient. All agreed  inpatient rehab is needed. Patient's family is supportive and can provide care at discharge. CIR bed is available today and patient will be transferred there.  Discharge Exam  Blood pressure 160/89, pulse 83, temperature 98.5 F (36.9 C), temperature source Oral, resp. rate 18, height 5\' 11"  (1.803 m), weight 85.4 kg (188 lb 4.4 oz), SpO2 98.00%. Physical Exam Awake alert. Afebrile. Head is nontraumatic. Neck is supple without bruit. Hearing is normal. Cardiac exam no murmur or gallop. Lungs are clear to auscultation. Distal pulses are well felt. Neurologic Exam : Awake Alert oriented x 3. Mildy dysarhtricspeech But normal language.Eye movements full without nystagmus but mild dysmetric saccades.. Face symmetric. Tongue midline. Normal strength, tone and Reflexes.Impaired but improved right upper extremity coordination with minimal finger to nose dysmetria.. Normal sensation. Gait deferred.   Discharge Diet  Dysphagia 2 nectar thick liquids  Discharge Plan - Disposition:  Transfer to [facility] for ongoing PT, OT and ST - aspirin 325 mg orally every day for secondary stroke prevention. - Ongoing risk factor control by Primary Care Physician. - Risk factor recommendations:  Hypertension target range 130-140/70-80  - Follow-up No primary provider on file. in 1 month. -  Follow-up with Dr. Delia Heady in 2 months.  Signed Annie Main, AVNP, ANP-BC, [facility] Nurse Practitioner 01/09/2012, 1:03 PM  Dr. Delia Heady, Stroke Center Medical Director, has personally reviewed chart, pertinent data, examined the patient and developed the plan of care.

## 2012-01-09 NOTE — Discharge Instructions (Signed)
STROKE/TIA DISCHARGE INSTRUCTIONS SMOKING Cigarette smoking nearly doubles your risk of having a stroke & is the single most alterable risk factor  If you smoke or have smoked in the last 12 months, you are advised to quit smoking for your health.  Most of the excess cardiovascular risk related to smoking disappears within a year of stopping.  Ask you doctor about anti-smoking medications  [place] Quit Line: 1-800-QUIT NOW  Free Smoking Cessation Classes [phone]  CHOLESTEROL Know your levels; limit fat & cholesterol in your diet  Lipid Panel     Component Value Date/Time   CHOL 136 01/07/2012 0730   TRIG 75 01/07/2012 0730   HDL 63 01/07/2012 0730   CHOLHDL 2.2 01/07/2012 0730   VLDL 15 01/07/2012 0730   LDLCALC 58 01/07/2012 0730      Many patients benefit from treatment even if their cholesterol is at goal.  Goal: Total Cholesterol (CHOL) less than 160  Goal:  Triglycerides (TRIG) less than 150  Goal:  HDL greater than 40  Goal:  LDL (LDLCALC) less than 100   BLOOD PRESSURE American Stroke Association blood pressure target is less that 120/80 mm/Hg  Your discharge blood pressure is:  BP: 160/89 mmHg  Monitor your blood pressure  Limit your salt and alcohol intake  Many individuals will require more than one medication for high blood pressure  DIABETES (A1c is a blood sugar average for last 3 months) Goal HGBA1c is under 7% (HBGA1c is blood sugar average for last 3 months)  Diabetes: No known diagnosis of diabetes    Lab Results  Component Value Date   HGBA1C 6.3* 01/07/2012     Your HGBA1c can be lowered with medications, healthy diet, and exercise.  Check your blood sugar as directed by your physician  Call your physician if you experience unexplained or low blood sugars.  PHYSICAL ACTIVITY/REHABILITATION Goal is 30 minutes at least 4 days per week    Activity:   Increase activity slowly,, No driving, and Walk with assistance, Therapies:   Physical Therapy:  Inpatient Rehab Unit at [facility], Occupational Therapy: Inpatient Rehab Unit at [facility] and Speech Therapy: Inpatient Rehab Unit at [facility] Return to work:  TBD  Activity decreases your risk of heart attack and stroke and makes your heart stronger.  It helps control your weight and blood pressure; helps you relax and can improve your mood.  Participate in a regular exercise program.  Talk with your doctor about the best form of exercise for you (dancing, walking, swimming, cycling).  DIET/WEIGHT Goal is to maintain a healthy weight  Your discharge diet is: Dysphagia 2 nectar thick liquids Your height is:  Height: 5\' 11"  (180.3 cm) Your current weight is: Weight: 85.4 kg (188 lb 4.4 oz) Your Body Mass Index (BMI) is:  BMI (Calculated): 38.1   Following the type of diet specifically designed for you will help prevent another stroke.  Your goal weight range is:  ***  Your goal Body Mass Index (BMI) is 19-24.  Healthy food habits can help reduce 3 risk factors for stroke:  High cholesterol, hypertension, and excess weight.  RESOURCES Stroke/Support Group:  Call [phone]  they meet the 3rd Sunday of the month on the Rehab Unit at [facility], [place] ( no meetings June, July & Aug).  STROKE EDUCATION PROVIDED/REVIEWED AND GIVEN TO PATIENT Stroke warning signs and symptoms How to activate emergency medical system (call 911). Medications prescribed at discharge. Need for follow-up after discharge. Personal  risk factors for stroke. Pneumonia vaccine given:   {STROKE DC YES/NO/DATE:22363} Flu vaccine given:   {STROKE DC YES/NO/DATE:22363} My questions have been answered, the writing is legible, and I understand these instructions.  I will adhere to these goals & educational materials that have been provided to me after my discharge from the hospital.

## 2012-01-09 NOTE — Procedures (Signed)
Objective Swallowing Evaluation: Modified Barium Swallowing Study  Patient Details  Name: Shane Juarez MRN: [id number] Date of Birth: [birth year]  Today's Date: 01/09/2012  Past Medical History:  Past Medical History  Diagnosis Date  . Hypertension    HPI:  Shane Juarez is a 78 y.o. 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 Stroke secondary to unknown embolic source. Passed RN stroke swallow screen initially, but developed subsequent cough with PO intake, bedside swallow evaluation completed on 4/15 revealing no overt s/o aspiration.  Cognitive-linguistic evalution on 4/16 revealed coughing with liquids during this unrelated assessment, warranting an objective swallow evaluation to determine nature of new signs of dysphagia.   Recommendation/Prognosis  Dysphagia Diagnosis: Mild pharyngeal phase dysphagia Clinical impression: Demonstrates a pharyngeal phase dysphagia with both sensed and silent aspiration of thin liquid trials due to a combination of mildy reduced laryngeal contraction, likely poor vocal cord function as evidenced by hoarse vocal quality as well as impedence of large C5-6 osteophyte into the UES and pharyngeal space and contributing to post-swallow residue.  A cued repeat dry swallow was successful in clearing pharyngeal residues, however bolus size modifications did not eliminate or reduce aspiration risk.    Swallow Evaluation Recommendations Diet Recommendations: Dysphagia 3 (Mechanical Soft);Nectar-thick liquid Liquid Administration via: Cup Medication Administration: Whole meds with puree Supervision: Patient able to self feed;Full supervision/cueing for compensatory strategies Compensations: Small sips/bites;Follow solids with liquid;Multiple dry swallows after each bite/sip Postural Changes and/or Swallow Maneuvers: Seated upright 90 degrees;Upright 30-60 min after meal Oral Care Recommendations: Oral care  BID;Patient independent with oral care Other Recommendations: Order thickener from pharmacy;Prohibited food (jello, ice cream, thin soups);Remove water pitcher Follow up Recommendations: Inpatient Rehab  Prognosis Prognosis for Safe Diet Advancement: Good Individuals Consulted Consulted and Agree with Results and Recommendations: Patient;MD;RN    Myra Rude, M.S.,CCC-SLP Pager [phone] 01/09/2012, 11:02 AM

## 2012-01-09 NOTE — Progress Notes (Signed)
Pt admitted to room 4151 via bed from 3000. Pt is alert and oriented, patient and family oriented to rehab unit, safety plan explained, call bell in reach. Pt and family verbalized an understanding, see [facility] for full assesment

## 2012-01-09 NOTE — Progress Notes (Signed)
I met with wife and pt at bedside. We plan to admit pt today to CIR today. Call [phone] with questions.

## 2012-01-09 NOTE — H&P (Signed)
Physical Medicine and Rehabilitation Admission H&P   Chief Complaint   Patient presents with   .  Difficulty walking   .  Weakness   :  HPI: IZEK CORVINO is an 78 y.o. male with history of HTN, admitted on 04/14 with fall 3 days PTA with inability to walk. CCT with large acute/subacute right cerebellar infarct with mass effect compressing 4th ventricle. CT cervical spine with multilevel spondylosis. 2 D echo with severe LVH, EF 65-70%. Carotid dopplers without ICA stenosis. Neurology recommends ASA for CVA prophylaxis. Follow up MRI/MRA brain done today revealing R-PICA infarct with petechial hemorrhage, severe cerebellar edema with mass effect, occlusion of distal RVA-?dissection. Neurology feels patient with CVA due to large vessel atherosclerosis leading to R-VA occlusion.  Patient on ASA for CVA prophylaxis.  MBS done today and diet modified to D3, nectar liquids due to aspiration.    Review of Systems  Eyes: Negative for blurred vision and double vision.  Respiratory: Negative for cough and shortness of breath.   Cardiovascular: Negative for chest pain and palpitations.  Gastrointestinal: Positive for constipation.  Musculoskeletal: Negative for back pain and joint pain.  Neurological: Positive for dizziness (intermittent), speech change and focal weakness.    Past Medical History   Diagnosis  Date   .  Hypertension     History reviewed. No pertinent past surgical history.  Family History   Problem  Relation  Age of Onset   .  Diabetes  Sister    .  Heart attack  Sister     Social History: Married. Still works partime doing Designer, television/film set on cars. He quit smoking 30+ years ago. He does not have any smokeless tobacco history on file. He reports that he does not drink alcohol or use illicit drugs.   Allergies: No Known Allergies   Scheduled Meds:  .  amLODipine  5 mg  Oral  Daily   .  aspirin  325 mg  Oral  Daily   .  enoxaparin (LOVENOX) injection  40 mg  Subcutaneous  Q24H     Continuous Infusions:  PRN Meds:.labetalol, ondansetron (ZOFRAN) IV, senna-docusate  No current outpatient prescriptions on file as of 01/09/2012.      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  Transfers  Transfers: Yes  Sit to Stand: 1: +2 Total assist;From bed;With upper extremity assist (pt = 50%)  Sit to Stand Details (indicate cue type and reason): Pt required +2 assist due to LOB posteriorly and to the right.  Stand to Sit: 1: +2 Total assist;Patient percentage (comment);To chair/3-in-1 (pt = 50%)  Stand to Sit Details: Pt required +2 assist due to uncoltrolled descent into chair with max VC.  Ambulation/Gait  Ambulation/Gait: Yes  Ambulation/Gait  Assistance: 1: +2 Total assist;Patient percentage (comment) (pt = 50%)  Ambulation/Gait Assistance Details (indicate cue type and reason): Pt required Total +2 assist due to loss of balance to the right side and unable to correct without facilitation/weight shift by therapist. Pt ambulated from EOB to sink To chair.  Ambulation Distance (Feet): 20 Feet  Assistive device: Rolling walker  Gait Pattern: Step-to pattern;Ataxic  Gait velocity: Decreased   Stairs: No   ADL:  ADL  Eating/Feeding: Performed;Set up  Eating/Feeding Details (indicate cue type and reason): Pt eating breakfast with prolonged time to chew due to no dentures present for meals  Where Assessed - Eating/Feeding: Bed level  Grooming: Performed;Teeth care;Maximal assistance (Max (A) due to balance deficits Pt leaning to the Rt heavily)  Grooming Details (indicate cue type and reason): Pt able to recognize lean to Rt and verbalizes. pt using mirror to self correct LOB to the right side. pt provided faciliation at hips and weight shift.  Where Assessed - Grooming: Standing at sink  Upper Body Dressing: Performed;Minimal assistance (Min (A) due to lines and leads ; doff gown supervision)  Upper Body Dressing Details (indicate cue type and reason): pt with posture lean and LOB at EOB with BIL UE elevation this session.  Where Assessed - Upper Body Dressing: Sitting, chair;Unsupported  Lower Body Dressing: Performed;Set up  Where Assessed - Lower Body Dressing: Supine, head of bed up  Toilet Transfer: Simulated;Maximal assistance (Max (A) due to balance deficits)  Toilet Transfer Method: Engineer, drilling: Raised toilet seat with arms (or 3-in-1 over toilet)  Equipment Used: Gait belt;Rolling walker  Ambulation Related to ADLs: Pt ambulated from EOB to sink To chair. Pt required Total +2 Pt 50% due to loss of balance to the right side and unable to correct without facilitation / weight shift by therapist.  ADL Comments: Pt pleasant and cooperative. Pt performed oral care at sink level this session and will require (A) for all adls due to balance deficits. pt recognizes deficits and can direct care.  Cognition:  Cognition  Overall Cognitive Status: Appears within functional limits for tasks assessed  Arousal/Alertness: Awake/alert  Orientation Level: Oriented X4  Cognition  Arousal/Alertness: Awake/alert  Overall Cognitive Status: Appears within  functional limits for tasks assessed  Orientation Level: Oriented X4    Blood pressure 160/89, pulse 83, temperature 98.5 F (36.9 C), temperature source Oral, resp. rate 18, height 5\' 11"  (1.803 m), weight 85.4 kg (188 lb 4.4 oz), SpO2 98.00%.   Physical Exam  Constitutional: He is oriented to person, place, and time. He appears well-developed and well-nourished.  HENT:  Head: Normocephalic and atraumatic.  Right Ear: External ear normal.  Left Ear: External ear normal.  Nose: Nose normal.  Mouth/Throat: Oropharynx is clear and moist.  Eyes: Conjunctivae and EOM are normal. Pupils are equal, round, and reactive to light.  Neck: Normal range of motion. Neck supple.  Cardiovascular: Normal rate and regular rhythm.  Exam reveals no gallop and no friction rub.   No murmur heard. Pulmonary/Chest: Effort normal and breath sounds normal. No respiratory distress.  Abdominal: Soft. Bowel sounds are normal. He exhibits no distension.  Neurological: He is alert and oriented to person, place, and time. A cranial nerve deficit (nystagmus to right lateral field) is present. Coordination abnormal.       Follows basic 1 and 2 step commands without difficulty. Speech soft and raspy (not baseline). Slight dysarthria. Right upper and lower limb ataxia.  RUE with pronator  drift. Strength fairly well preserved. Sensation grossly intact in all 4's  Skin: Skin is warm and dry.  Psychiatric: He has a normal mood and affect. His behavior is normal. Judgment and thought content normal.   Results for orders placed during the hospital encounter of 01/06/12 (from the past 48 hour(s))   GLUCOSE, CAPILLARY Status: Abnormal    Collection Time    01/08/12 7:43 AM   Component  Value  Range  Comment    Glucose-Capillary  138 (*)  70 - 99 (mg/dL)    GLUCOSE, CAPILLARY Status: Abnormal    Collection Time    01/08/12 12:09 PM   Component  Value  Range  Comment    Glucose-Capillary  155 (*)  70 - 99 (mg/dL)     Comment  1  Notify RN      Comment 2  Documented in Chart     GLUCOSE, CAPILLARY Status: Abnormal    Collection Time    01/08/12 5:21 PM   Component  Value  Range  Comment    Glucose-Capillary  128 (*)  70 - 99 (mg/dL)     Comment 1  Notify RN      Comment 2  Documented in Chart     GLUCOSE, CAPILLARY Status: Abnormal    Collection Time    01/08/12 9:51 PM   Component  Value  Range  Comment    Glucose-Capillary  180 (*)  70 - 99 (mg/dL)     Comment 1  Documented in Chart      Comment 2  Notify RN     GLUCOSE, CAPILLARY Status: Abnormal    Collection Time    01/09/12 7:08 AM   Component  Value  Range  Comment    Glucose-Capillary  131 (*)  70 - 99 (mg/dL)     Comment 1  Documented in Chart      Comment 2  Notify RN     GLUCOSE, CAPILLARY Status: Abnormal    Collection Time    01/09/12 11:38 AM   Component  Value  Range  Comment    Glucose-Capillary  172 (*)  70 - 99 (mg/dL)     Mr Brain Wo Contrast  01/08/2012 *RADIOLOGY REPORT* Clinical Data: 78 year old male who sustained blunt trauma with subsequent nausea, vomiting, difficulty with ambulation. Cerebellar infarct discovered on CT. Comparison: Head CTs 01/07/2012, 01/06/2012. MRI HEAD WITHOUT CONTRAST Technique: Multiplanar, multiecho pulse sequences of the brain and surrounding structures were obtained according to standard protocol without intravenous contrast. Findings: Confluent restricted diffusion throughout the inferior half of the right cerebellum. Diffusion is restricted in the lateral right medulla oblongata (series 4 image 7). However, the cervicomedullary junction and remainder the brain stem are spared. No left cerebellar or supratentorial restricted diffusion. There is severe cerebellar edema with effacement of the fourth ventricle. There is associated petechial hemorrhage near the midline. There is curvilinear intrinsic T1 signal in the lateral aspect of the infarcted cerebellum which might be related to venous cortical occlusion.  Nearby, the flow void of the distal right transverse and sigmoid sinus is decreased. The lateral and third ventricles are mildly prominent including the temporal horns, but no transependymal edema is evident. Both distal vertebral artery flow voids are present, although the right vertebral flow void is faint in the distal cervical spine (series 6 image 1) and shows some surrounding T2 hyperintensity. There are multiple chronic infarcts in the left cerebellum. There is comparatively mild T2 heterogeneity in the brain stem. There  is mild to moderate nonspecific white matter T2 and FLAIR hyperintensity. The deep gray matter nuclei are within normal limits. The anterior circulation flow voids are preserved. Partially empty sella configuration. Visualized cervical spine is negative except for decreased T1 marrow signal in the odontoid, not correlated with a CT abnormality recently. Visualized orbit soft tissues are within normal limits. Visualized paranasal sinuses and mastoids are clear. Negative scalp soft tissues. IMPRESSION: 1. Large right PICA infarct with petechial hemorrhage and severe cerebellar edema. Mass effect in the posterior fossa and effacement of the fourth ventricle with mild lateral and third ventriculomegaly, but no transependymal edema at this time. 2. See MRA findings below. Suggestion of slow flow or perhaps early occlusion of the right sigmoid sinus. 3. Superimposed chronic left cerebellar infarcts. Nonspecific mild to moderate cerebral white matter signal changes. MRA HEAD WITHOUT CONTRAST Technique: Angiographic images of the Circle of Willis were obtained using MRA technique without intravenous contrast. Findings: Absent antegrade flow in the distal right vertebral artery. No right PICA or significant right AICA flow signal. Flow signal is preserved in the distal left vertebral artery which is moderately irregular and demonstrates mild stenosis beyond the patent left PICA origin. The basilar  artery is patent. No basilar stenosis. Superior cerebellar artery and PCA origins are normal. The right posterior communicating artery is normal, the left is diminutive or absent. There is mild to moderate right PCA P2 segment stenosis and left PCA P3 the superior division stenosis. There is preserved distal flow on both sides. Antegrade flow in both ICA siphons. Mild to moderate ICA atherosclerosis with no hemodynamically significant stenosis. Normal ophthalmic and right posterior communicating artery origins. Normal carotid termini, MCA and ACA origins. Normal anterior communicating artery. Visualized ACA branches are within normal limits. Visualized bilateral MCA branches are within normal limits. IMPRESSION: 1. 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 No right PICA or AICA flow. 2. Distal left vertebral artery is patent but demonstrates atherosclerosis and stenosis. Mild to moderate bilateral PCA stenosis with preserved distal flow. 3. ICA atherosclerosis without significant stenosis otherwise negative anterior circulation. Findings discussed with critical care Dr. Sung Amabile by telephone at 0830 hours on 01/08/2012. Original Report Authenticated By: Harley Hallmark, M.D.  Mr Maxine Glenn Head/brain Wo Cm  01/08/2012 *RADIOLOGY REPORT* Clinical Data: 78 year old male who sustained blunt trauma with subsequent nausea, vomiting, difficulty with ambulation. Cerebellar infarct discovered on CT. Comparison: Head CTs 01/07/2012, 01/06/2012. MRI HEAD WITHOUT CONTRAST Technique: Multiplanar, multiecho pulse sequences of the brain and surrounding structures were obtained according to standard protocol without intravenous contrast. Findings: Confluent restricted diffusion throughout the inferior half of the right cerebellum. Diffusion is restricted in the lateral right medulla oblongata (series 4 image 7). However, the cervicomedullary junction and  remainder the brain stem are spared. No left cerebellar or supratentorial restricted diffusion. There is severe cerebellar edema with effacement of the fourth ventricle. There is associated petechial hemorrhage near the midline. There is curvilinear intrinsic T1 signal in the lateral aspect of the infarcted cerebellum which might be related to venous cortical occlusion. Nearby, the flow void of the distal right transverse and sigmoid sinus is decreased. The lateral and third ventricles are mildly prominent including the temporal horns, but no transependymal edema is evident. Both distal vertebral artery flow voids are present, although the right vertebral flow void is faint in the distal cervical spine (series 6 image 1) and shows some surrounding T2 hyperintensity. There are  multiple chronic infarcts in the left cerebellum. There is comparatively mild T2 heterogeneity in the brain stem. There is mild to moderate nonspecific white matter T2 and FLAIR hyperintensity. The deep gray matter nuclei are within normal limits. The anterior circulation flow voids are preserved. Partially empty sella configuration. Visualized cervical spine is negative except for decreased T1 marrow signal in the odontoid, not correlated with a CT abnormality recently. Visualized orbit soft tissues are within normal limits. Visualized paranasal sinuses and mastoids are clear. Negative scalp soft tissues. IMPRESSION: 1. Large right PICA infarct with petechial hemorrhage and severe cerebellar edema. Mass effect in the posterior fossa and effacement of the fourth ventricle with mild lateral and third ventriculomegaly, but no transependymal edema at this time. 2. See MRA findings below. Suggestion of slow flow or perhaps early occlusion of the right sigmoid sinus. 3. Superimposed chronic left cerebellar infarcts. Nonspecific mild to moderate cerebral white matter signal changes. MRA HEAD WITHOUT CONTRAST Technique: Angiographic images of the  Circle of Willis were obtained using MRA technique without intravenous contrast. Findings: Absent antegrade flow in the distal right vertebral artery. No right PICA or significant right AICA flow signal. Flow signal is preserved in the distal left vertebral artery which is moderately irregular and demonstrates mild stenosis beyond the patent left PICA origin. The basilar artery is patent. No basilar stenosis. Superior cerebellar artery and PCA origins are normal. The right posterior communicating artery is normal, the left is diminutive or absent. There is mild to moderate right PCA P2 segment stenosis and left PCA P3 the superior division stenosis. There is preserved distal flow on both sides. Antegrade flow in both ICA siphons. Mild to moderate ICA atherosclerosis with no hemodynamically significant stenosis. Normal ophthalmic and right posterior communicating artery origins. Normal carotid termini, MCA and ACA origins. Normal anterior communicating artery. Visualized ACA branches are within normal limits. Visualized bilateral MCA branches are within normal limits. IMPRESSION: 1. 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 No right PICA or AICA flow. 2. Distal left vertebral artery is patent but demonstrates atherosclerosis and stenosis. Mild to moderate bilateral PCA stenosis with preserved distal flow. 3. ICA atherosclerosis without significant stenosis otherwise negative anterior circulation. Findings discussed with critical care Dr. Sung Amabile by telephone at 0830 hours on 01/08/2012. Original Report Authenticated By: Harley Hallmark, M.D.   Post Admission Physician Evaluation:  1. Functional deficits secondary to right cerebellar infarct. 2. Patient is admitted to receive collaborative, interdisciplinary care between the physiatrist, rehab nursing staff, and therapy team. 3. Patient's level of medical complexity and substantial  therapy needs in context of that medical necessity cannot be provided at a lesser intensity of care such as a SNF. 4. Patient has experienced substantial functional loss from his/her baseline which was documented above under the "Functional History" and "Functional Status" headings. Judging by the patient's diagnosis, physical exam, and functional history, the patient has potential for functional progress which will result in measurable gains while on inpatient rehab. These gains will be of substantial and practical use upon discharge in facilitating mobility and self-care at the household level. 5. Physiatrist will provide 24 hour management of medical needs as well as oversight of the therapy plan/treatment and provide guidance as appropriate regarding the interaction of the two. 6. 24 hour rehab nursing will assist with bladder management, bowel management, safety, skin/wound care, disease management, medication administration and patient education and help integrate therapy concepts, techniques,education, etc. 7.  PT will assess and treat for: NMR, safety, balance, coordination, gait, adaptive equipment, and safety. Goals are: mod I to supervision. 8. OT will assess and treat for: UES, ROM, ADL's, NMR, adaptive equipment, safety, ed. Goals are: mod I to minimal assist. 9. SLP will assess and treat for: speech intelligibility, communication. Goals are: mod I. 10. Case Management and Social Worker will assess and treat for psychological issues and discharge planning. 11. Team conference will be held weekly to assess progress toward goals and to determine barriers to discharge. 12. Patient will receive at least 3 hours of therapy per day at least 5 days per week. 13. ELOS and Prognosis: 2 weeks/ excellent   Medical Problem List and Plan:  1. DVT Prophylaxis/Anticoagulation: Lovenox  2. Pain Management:  N/A  3. Mood:  Motivated.  LSCW to follow up for formal evaluation. Mood fairly upbeat at  present.  4. HTN: Monitor with bid checks continue   5. Dysphagia: Continue D3 diet with nectar liquids and full supervision to prevent aspiration. May need IVF at nights to prevent dehydration. Will monitor intake and lytes for hydration status. Check labs in the AM.  6. Impaired Fasting Glucose: Continue CBG checks to monitor for trends as Hgb AIC@6 .3. Anticipate higher BS for now due to sweetened liquids As primary source of fluid intake. Will use SSI for elevated BS.   Ivory Broad, MD 01/09/2012

## 2012-01-10 DIAGNOSIS — I6330 Cerebral infarction due to thrombosis of unspecified cerebral artery: Secondary | ICD-10-CM

## 2012-01-10 DIAGNOSIS — G8110 Spastic hemiplegia affecting unspecified side: Secondary | ICD-10-CM

## 2012-01-10 DIAGNOSIS — Z5189 Encounter for other specified aftercare: Secondary | ICD-10-CM

## 2012-01-10 LAB — CBC
HCT: 35 % — ABNORMAL LOW (ref 39.0–52.0)
Hemoglobin: 11.9 g/dL — ABNORMAL LOW (ref 13.0–17.0)
MCH: 32.2 pg (ref 26.0–34.0)
MCHC: 34 g/dL (ref 30.0–36.0)
MCV: 94.9 fL (ref 78.0–100.0)
Platelets: 222 10*3/uL (ref 150–400)
RBC: 3.69 MIL/uL — ABNORMAL LOW (ref 4.22–5.81)
RDW: 13.3 % (ref 11.5–15.5)
WBC: 7.3 10*3/uL (ref 4.0–10.5)

## 2012-01-10 LAB — GLUCOSE, CAPILLARY
Glucose-Capillary: 139 mg/dL — ABNORMAL HIGH (ref 70–99)
Glucose-Capillary: 158 mg/dL — ABNORMAL HIGH (ref 70–99)
Glucose-Capillary: 154 mg/dL — ABNORMAL HIGH (ref 70–99)
Glucose-Capillary: 133 mg/dL — ABNORMAL HIGH (ref 70–99)

## 2012-01-10 LAB — COMPREHENSIVE METABOLIC PANEL
ALT: 13 U/L (ref 0–53)
AST: 15 U/L (ref 0–37)
Albumin: 3 g/dL — ABNORMAL LOW (ref 3.5–5.2)
Alkaline Phosphatase: 75 U/L (ref 39–117)
BUN: 17 mg/dL (ref 6–23)
CO2: 24 mEq/L (ref 19–32)
Calcium: 8.7 mg/dL (ref 8.4–10.5)
Chloride: 106 mEq/L (ref 96–112)
Creatinine, Ser: 1.09 mg/dL (ref 0.50–1.35)
GFR calc Af Amer: 72 mL/min — ABNORMAL LOW (ref >90)
GFR calc non Af Amer: 62 mL/min — ABNORMAL LOW (ref >90)
Glucose, Bld: 152 mg/dL — ABNORMAL HIGH (ref 70–99)
Potassium: 4 mEq/L (ref 3.5–5.1)
Sodium: 140 mEq/L (ref 135–145)
Total Bilirubin: 0.4 mg/dL (ref 0.3–1.2)
Total Protein: 6.4 g/dL (ref 6.0–8.3)

## 2012-01-10 LAB — DIFFERENTIAL
Basophils Absolute: 0 10*3/uL (ref 0.0–0.1)
Basophils Relative: 0 % (ref 0–1)
Eosinophils Absolute: 0 10*3/uL (ref 0.0–0.7)
Eosinophils Relative: 0 % (ref 0–5)
Lymphocytes Relative: 28 % (ref 12–46)
Lymphs Abs: 2 10*3/uL (ref 0.7–4.0)
Monocytes Absolute: 0.7 10*3/uL (ref 0.1–1.0)
Monocytes Relative: 9 % (ref 3–12)
Neutro Abs: 4.6 10*3/uL (ref 1.7–7.7)
Neutrophils Relative %: 63 % (ref 43–77)

## 2012-01-10 MED ORDER — SENNOSIDES 8.8 MG/5ML PO SYRP
10.0000 mL | ORAL_SOLUTION | Freq: Every day | ORAL | Status: DC
  Administered 2012-01-10 – 2012-01-28 (×17): 10 mL via ORAL
  Filled 2012-01-10 (×23): qty 10

## 2012-01-10 MED ORDER — LISINOPRIL 5 MG PO TABS
5.0000 mg | ORAL_TABLET | Freq: Every day | ORAL | Status: DC
  Administered 2012-01-10 – 2012-01-12 (×3): 5 mg via ORAL
  Filled 2012-01-10 (×4): qty 1

## 2012-01-10 NOTE — Evaluation (Signed)
Physical Therapy Assessment and Plan  Patient Details  Name: Shane Juarez MRN: [id number] Date of Birth: [birth year]  PT Diagnosis: Abnormality of gait, Ataxia, Ataxic gait and Hemiparesis dominant Rehab Potential: Good ELOS:   2-3 weeks  Today's Date: 01/10/2012 Time: 4098-1191 Time Calculation (min): 58 min  Problem List:  Patient Active Problem List  Diagnoses  . CVA (cerebral vascular accident)  . HTN (hypertension)    Past Medical History:  Past Medical History  Diagnosis Date  . Hypertension    Past Surgical History: No past surgical history on file.  Assessment & Plan Clinical Impression:Shane Juarez is an 78 y.o. male with history of HTN, admitted on 04/14 with fall 3 days PTA with inability to walk. CCT with large acute/subacute right cerebellar infarct with mass effect compressing 4th ventricle. CT cervical spine with multilevel spondylosis. 2 D echo with severe LVH, EF 65-70%. Carotid dopplers without ICA stenosis. Neurology recommends ASA for CVA prophylaxis. Follow up MRI/MRA brain done today revealing R-PICA infarct with petechial hemorrhage, severe cerebellar edema with mass effect, occlusion of distal RVA-?dissection. Neurology feels patient with CVA due to large vessel atherosclerosis leading to R-VA occlusion.  Patient on ASA for CVA prophylaxis.  MBS done today and diet modified to D3, nectar liquids due to aspiration.     Patient transferred to CIR on 01/09/2012 .   Patient currently requires up to total A +2 with mobility secondary to decreased coordination, impaired timing and sequencing, unbalanced muscle activation and ataxia, decreased midline orientation and decreased sitting balance, decreased standing balance, decreased postural control, hemiplegia, decreased balance strategies and difficulty maintaining precautions.  Prior to hospitalization, patient was fully I in community with mobility and lived with wife   in a 1 level   home.  Home access is 1 step to  enter   .  Patient will benefit from skilled PT intervention to maximize safe functional mobility, minimize fall risk and decrease caregiver burden for planned discharge home with 24 hour supervision.  Anticipate patient will benefit from follow up OP at discharge vs HHPT, TBD based on progress.  PT - End of Session Activity Tolerance: Tolerates 30+ min activity with multiple rests PT Assessment Rehab Potential: Good Barriers to Discharge: None PT Plan PT Frequency: 1-2 X/day, 60-90 minutes PT Treatment/Interventions: Ambulation/gait training;Functional mobility training;Wheelchair propulsion/positioning;UE/LE Coordination activities;Neuromuscular re-education;Disease management/prevention;Balance/vestibular training;Cognitive remediation/compensation;DME/adaptive equipment instruction;Patient/family education;Community reintegration;Therapeutic Exercise;Psychosocial support;Therapeutic Activities;Visual/perceptual remediation/compensation PT Recommendation Follow Up Recommendations: Outpatient PT;Home health PT (TBD) Equipment Details: PT- TBD  PT Evaluation Precautions/Restrictions Precautions Precautions: Fall Restrictions Weight Bearing Restrictions: No l Vital Signs Therapy Vitals Pulse Rate: 112  (with gait) Pain Pain Assessment Pain Assessment: No/denies pain Home Living/Prior Functioning Home Living Entrance Stairs-Rails:  (1 step to enter, can hold door frame) Prior Function Level of Independence: Independent with transfers Vocation: Part time employment Vocation Requirements: worked on Youth worker - History Baseline Vision: Wears glasses only for reading Patient Visual Report:  (reports vision problems from CVA have resolved) Perception Perception: Impaired Spatial Orientation: does not percieve midline  Cognition Overall Cognitive Status:  (needed repetitive cues for swallow strategy) Sensation Sensation Light Touch: Appears  Intact Proprioception: Impaired Detail Additional Comments: intact with confrontational testing but not in trunk with mobility Coordination Gross Motor Movements are Fluid and Coordinated: No Fine Motor Movements are Fluid and Coordinated: No Finger Nose Finger Test: impaired R Heel Shin Test: impaired R Motor  Motor Motor: Hemiplegia;Ataxia  Mobility   Locomotion  Ambulation Ambulation/Gait Assistance:  1: +2 Total assist  Trunk/Postural Assessment  Cervical Assessment Cervical Assessment: Within Functional Limits Thoracic Assessment Thoracic Assessment: Within Functional Limits Lumbar Assessment Lumbar Assessment: Within Functional Limits Postural Control Postural Control: Deficits on evaluation Righting Reactions: max cues to correct R lean even with visual feedback, nmot automatic Protective Responses: not present with falling to R during gait Postural Limitations: heavy R lean in standing and back and to R seated  Balance Balance Balance Assessed: Yes Static Sitting Balance Static Sitting - Balance Support: Feet supported;No upper extremity supported Static Sitting - Level of Assistance: 5: Stand by assistance Dynamic Sitting Balance Dynamic Sitting - Level of Assistance: 4: Min assist (when reaches R min A to return to midline) Static Standing Balance Static Standing - Balance Support: No upper extremity supported Static Standing - Level of Assistance: 2: Max assist (variable, max at worst) Dynamic Standing Balance Dynamic Standing - Level of Assistance: 1: +2 Total assist;2: Max assist Extremity Assessment      RLE Assessment RLE Assessment: Exceptions to [facility] RLE AROM (degrees) RLE Overall AROM Comments: strength WFL but impaired motor control and coordination LLE Assessment LLE Assessment: Within Functional Limits  See FIM for current functional status Refer to Care Plan for Long Term Goals  Recommendations for other services: None  Discharge Criteria:  Patient will be discharged from PT if patient refuses treatment 3 consecutive times without medical reason, if treatment goals not met, if there is a change in medical status, if patient makes no progress towards goals or if patient is discharged from hospital.  The above assessment, treatment plan, treatment alternatives and goals were discussed and mutually agreed upon: by patient and by family  Skilled intervention: NMR, postural control seated and standing, visual feedback only minimally improved pt midline orientation, requires max cues and assist to achieve midline standing even with B UE assist; stood with 1 UE on RW and reached up and down moving horsehoes with L UE to enourage weight shift L with min to max A for balance; step ups on step with significant ataxia stepping up with R foot first to load it (pt tending to step on second step trying to get to only first)- pt at times leaning so much to R he was moving entire stair case and he would not stop to correct until instructed; gait total A + 2 with HHA on R tending to adduct RLE and fall R, if cues to step out could walk with wide BOS for several steps then adduct RLE again  Michaelene Song 01/10/2012, 9:23 AM

## 2012-01-10 NOTE — Progress Notes (Signed)
Occupational Therapy Session Note  Patient Details  Name: Shane Juarez MRN: [id number] Date of Birth: 1930-03-30  Today's Date: 01/10/2012 Time: 5784-6962 Time Calculation (min): 30 min    Skilled Therapeutic Interventions/Progress Updates: Patient seen for 1:1 OT session to begin to address trunk control and mobility.  Patient resists movement to left and behind base of support in sitting.  Patient falls to right in sitting.  Patient able to begin to correct with both physical and verbal cueing.  Patient with very limited spontaneous head movement, although with minimal physical cueing able to add head movement to weight shifts throughout trunk.  Patient can verbalize a fear of weight shift to left while seated.     Therapy Documentation Precautions:  Precautions Precautions: Fall Restrictions Weight Bearing Restrictions: No Pain: Pain Assessment Pain Assessment: No/denies pain Pain Score: 0-No pain Faces Pain Scale: No hurt PAINAD (Pain Assessment in Advanced Dementia) Breathing: normal     See FIM for current functional status  Therapy/Group: Individual Therapy  Collier Salina 01/10/2012, 12:23 PM

## 2012-01-10 NOTE — Evaluation (Signed)
Occupational Therapy Assessment and Plan  Patient Details  Name: Shane Juarez MRN: [id number] Date of Birth: 1930/02/08  OT Diagnosis: abnormal posture, ataxia, cognitive deficits and disturbance of vision Rehab Potential: Rehab Potential: Good ELOS: 2 weeks   Today's Date: 01/10/2012 Time: 4098-1191 Time Calculation (min): 60 min  Problem List:  Patient Active Problem List  Diagnoses  . CVA (cerebral vascular accident)  . HTN (hypertension)    Past Medical History:  Past Medical History  Diagnosis Date  . Hypertension    Past Surgical History: No past surgical history on file.  Assessment & Plan Clinical Impression: Patient is a 78 y.o. year old male with recent admission to the hospital on 01/06/2012 with 3 day history of falls.  Patient with stroke left acute/subacute right cerebellar with mass effect, compressing 4th ventricle.  Patient with R PICA, and severe cerebellar edema.  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  Patient also has multilevel spondylosis.  Patient transferred to CIR on 01/09/2012 .    Patient currently requires mod with basic self-care skills secondary to impaired timing and sequencing, abnormal tone, unbalanced muscle activation, ataxia and decreased coordination, decreased visual motor skills, decreased midline orientation, decreased attention, decreased awareness, decreased problem solving, decreased safety awareness, decreased memory and delayed processing and central origin.  Prior to hospitalization, patient could complete basic self care skills with no assistance.  Patient will benefit from skilled intervention to decrease level of assist with basic self-care skills and increase independence with basic self-care skills prior to discharge home with care partner.  Anticipate patient will require 24 hour supervision and follow up home health and follow up outpatient.  OT - End of Session Activity Tolerance: Tolerates 30+ min activity with multiple  rests OT Assessment Rehab Potential: Good Barriers to Discharge: None OT Plan OT Frequency: 1-2 X/day, 60-90 minutes Estimated Length of Stay: 2 weeks OT Treatment/Interventions: Balance/vestibular training;Cognitive remediation/compensation;DME/adaptive equipment instruction;Functional mobility training;Neuromuscular re-education;Patient/family education;Self Care/advanced ADL retraining;Therapeutic Activities;Therapeutic Exercise;UE/LE Coordination activities;Visual/perceptual remediation/compensation OT Recommendation Recommendations for Other Services: Other (comment) Follow Up Recommendations: Home health OT (home health then OP OT) Equipment Recommended: 3 in 1 bedside comode (To be determined)  OT Evaluation Precautions/Restrictions  Precautions Precautions: Fall Restrictions Weight Bearing Restrictions: No General Chart Reviewed: Yes Pain Pain Assessment Pain Assessment: No/denies pain Pain Score: 0-No pain Faces Pain Scale: No hurt PAINAD (Pain Assessment in Advanced Dementia) Breathing: normal Home Living/Prior Functioning Home Living Lives With: Spouse Entrance Stairs-Rails:  (1 step to enter, can hold door frame) Prior Function Level of Independence: Independent with transfers Vocation: Part time employment Vocation Requirements: worked on cars     Vision/Perception  Vision - History Baseline Vision:  (little exploration of inferior fields of vision) Visual History:  (initial diplopia after stroke.  Resolved per patient) Patient Visual Report:  (reports vision problems from CVA have resolved) Vision - Assessment Eye Alignment: Impaired (comment) (mild impairmaent left eye moving nasally) Vision Assessment: Vision impaired - to be further tested in functional context (difficulty following instructions for testing) Perception Perception: Impaired Spatial Orientation: midline orientation impaired Praxis Praxis: Intact  Cognition Overall Cognitive Status:  Impaired Arousal/Alertness: Awake/alert Orientation Level: Oriented X4 Attention: Sustained Sustained Attention: Appears intact Memory: Impaired Awareness: Impaired Awareness Impairment: Anticipatory impairment Problem Solving: Impaired Problem Solving Impairment: Functional complex Sensation Sensation Light Touch: Appears Intact Proprioception: Impaired Detail Additional Comments: intact with confrontational testing but not in trunk with mobility Coordination Gross Motor Movements are Fluid and Coordinated: No Fine Motor Movements are Fluid and  Coordinated: No Finger Nose Finger Test: impaired R Heel Shin Test: impaired R Motor  Motor Motor: Ataxia;Abnormal postural alignment and control     Trunk/Postural Assessment  Cervical Assessment Cervical Assessment: Within Functional Limits Thoracic Assessment Thoracic Assessment: Within Functional Limits Lumbar Assessment Lumbar Assessment: Within Functional Limits Postural Control Postural Control: Deficits on evaluation Righting Reactions: severely delayed to right, premature to left Protective Responses: absent Postural Limitations: heavy R lean in standing and back and to R seated  Balance Balance Balance Assessed: Yes Static Sitting Balance Static Sitting - Balance Support: No upper extremity supported Static Sitting - Level of Assistance: 5: Stand by assistance Dynamic Sitting Balance Dynamic Sitting - Level of Assistance: 4: Min assist Static Standing Balance Static Standing - Balance Support: Bilateral upper extremity supported Static Standing - Level of Assistance: 3: Mod assist Dynamic Standing Balance Dynamic Standing - Level of Assistance: Not tested (comment) Extremity/Trunk Assessment RUE Assessment RUE Assessment: Exceptions to [facility] (has active movement but dysmetric / ataxic) LUE Assessment LUE Assessment: Within Functional Limits  See FIM for current functional status Refer to Care Plan for Long Term  Goals  Recommendations for other services: None  Discharge Criteria: Patient will be discharged from OT if patient refuses treatment 3 consecutive times without medical reason, if treatment goals not met, if there is a change in medical status, if patient makes no progress towards goals or if patient is discharged from hospital.  The above assessment, treatment plan, treatment alternatives and goals were discussed and mutually agreed upon: by patient  Collier Salina 01/10/2012, 12:14 PM

## 2012-01-10 NOTE — Plan of Care (Signed)
Problem: RH BOWEL ELIMINATION Goal: RH STG MANAGE BOWEL WITH ASSISTANCE STG Manage Bowel with minimal Assistance.  Outcome: Not Progressing Suppository to be admin after dinner (SEE MAR)

## 2012-01-10 NOTE — Progress Notes (Signed)
Patient ID: Shane Juarez, male   DOB: 03-08-1930, 78 y.o.   MRN: [id number]  Subjective/Complaints: Shane Juarez is an 78 y.o. male with history of HTN, admitted on 04/14 with fall 3 days PTA with inability to walk. CCT with large acute/subacute right cerebellar infarct with mass effect compressing 4th ventricle. CT cervical spine with multilevel spondylosis. 2 D echo with severe LVH, EF 65-70%. Carotid dopplers without ICA stenosis. Neurology recommends ASA for CVA prophylaxis. Follow up MRI/MRA brain done today revealing R-PICA infarct with petechial hemorrhage, severe cerebellar edema with mass effect, occlusion of distal RVA-?dissection. Neurology feels patient with CVA due to large vessel atherosclerosis leading to R-VA occlusion  ROS Objective: Vital Signs: Blood pressure 182/77, pulse 87, temperature 97.8 F (36.6 C), temperature source Oral, resp. rate 19, height 5\' 9"  (1.753 m), weight 85.6 kg (188 lb 11.4 oz), SpO2 97.00%. No results found. Results for orders placed during the hospital encounter of 01/09/12 (from the past 72 hour(s))  GLUCOSE, CAPILLARY     Status: Abnormal   Collection Time   01/09/12  4:10 PM      Component Value Range Comment   Glucose-Capillary 164 (*) 70 - 99 (mg/dL)    Comment 1 Notify RN     GLUCOSE, CAPILLARY     Status: Abnormal   Collection Time   01/09/12  9:02 PM      Component Value Range Comment   Glucose-Capillary 159 (*) 70 - 99 (mg/dL)    Comment 1 Notify RN     CBC     Status: Abnormal   Collection Time   01/10/12  7:00 AM      Component Value Range Comment   WBC 7.3  4.0 - 10.5 (K/uL)    RBC 3.69 (*) 4.22 - 5.81 (MIL/uL)    Hemoglobin 11.9 (*) 13.0 - 17.0 (g/dL)    HCT 82.9 (*) 56.2 - 52.0 (%)    MCV 94.9  78.0 - 100.0 (fL)    MCH 32.2  26.0 - 34.0 (pg)    MCHC 34.0  30.0 - 36.0 (g/dL)    RDW 13.0  86.5 - 78.4 (%)    Platelets 222  150 - 400 (K/uL)   COMPREHENSIVE METABOLIC PANEL     Status: Abnormal   Collection Time   01/10/12  7:00 AM       Component Value Range Comment   Sodium 140  135 - 145 (mEq/L)    Potassium 4.0  3.5 - 5.1 (mEq/L)    Chloride 106  96 - 112 (mEq/L)    CO2 24  19 - 32 (mEq/L)    Glucose, Bld 152 (*) 70 - 99 (mg/dL)    BUN 17  6 - 23 (mg/dL)    Creatinine, Ser 6.96  0.50 - 1.35 (mg/dL)    Calcium 8.7  8.4 - 10.5 (mg/dL)    Total Protein 6.4  6.0 - 8.3 (g/dL)    Albumin 3.0 (*) 3.5 - 5.2 (g/dL)    AST 15  0 - 37 (U/L)    ALT 13  0 - 53 (U/L)    Alkaline Phosphatase 75  39 - 117 (U/L)    Total Bilirubin 0.4  0.3 - 1.2 (mg/dL)    GFR calc non Af Amer 62 (*) >90 (mL/min)    GFR calc Af Amer 72 (*) >90 (mL/min)   DIFFERENTIAL     Status: Normal   Collection Time   01/10/12  7:00 AM  Component Value Range Comment   Neutrophils Relative 63  43 - 77 (%)    Neutro Abs 4.6  1.7 - 7.7 (K/uL)    Lymphocytes Relative 28  12 - 46 (%)    Lymphs Abs 2.0  0.7 - 4.0 (K/uL)    Monocytes Relative 9  3 - 12 (%)    Monocytes Absolute 0.7  0.1 - 1.0 (K/uL)    Eosinophils Relative 0  0 - 5 (%)    Eosinophils Absolute 0.0  0.0 - 0.7 (K/uL)    Basophils Relative 0  0 - 1 (%)    Basophils Absolute 0.0  0.0 - 0.1 (K/uL)   GLUCOSE, CAPILLARY     Status: Abnormal   Collection Time   01/10/12  7:10 AM      Component Value Range Comment   Glucose-Capillary 139 (*) 70 - 99 (mg/dL)    Comment 1 Notify RN     GLUCOSE, CAPILLARY     Status: Abnormal   Collection Time   01/10/12 11:48 AM      Component Value Range Comment   Glucose-Capillary 158 (*) 70 - 99 (mg/dL)    Comment 1 Notify RN     GLUCOSE, CAPILLARY     Status: Abnormal   Collection Time   01/10/12  3:51 PM      Component Value Range Comment   Glucose-Capillary 154 (*) 70 - 99 (mg/dL)    Comment 1 Notify RN       Physical Exam  Constitutional: He is oriented to person, place, and time. He appears well-developed and well-nourished.  HENT:  Head: Normocephalic and atraumatic.  Right Ear: External ear normal.  Left Ear: External ear normal.  Nose:  Nose normal.  Mouth/Throat: Oropharynx is clear and moist.  Eyes: Conjunctivae and EOM are normal. Pupils are equal, round, and reactive to light.  Neck: Normal range of motion. Neck supple.  Cardiovascular: Normal rate and regular rhythm. Exam reveals no gallop and no friction rub.  No murmur heard.  Pulmonary/Chest: Effort normal and breath sounds normal. No respiratory distress.  Abdominal: Soft. Bowel sounds are normal. He exhibits no distension.  Neurological: He is alert and oriented to person, place, and time. A cranial nerve deficit (nystagmus to right lateral field) is present. Coordination abnormal.  Follows basic 1 and 2 step commands without difficulty. Speech soft and raspy (not baseline). Slight dysarthria. Right upper and lower limb ataxia moderate on finger to nose and heel shin testing. RUE with pronator drift. Strength fairly well preserved. Sensation grossly intact in all 4's and in face Voice is hoarse no dysarthria Skin: Skin is warm and dry.  Psychiatric: He has a normal mood and affect. His behavior is normal. Judgment and thought content normal  Assessment/Plan: 1. 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 Physiatrist is providing close team supervision and 24 hour management of active medical problems listed below. Physiatrist and rehab team continue to assess barriers to discharge/monitor patient progress toward functional and medical goals. FIM: FIM - Bathing Bathing Steps Patient Completed: Chest;Right Arm;Left Arm;Abdomen;Front perineal area;Buttocks;Right upper leg;Left upper leg;Right lower leg (including foot);Left lower leg (including foot) Bathing: 4: Steadying assist  FIM - Upper Body Dressing/Undressing Upper body dressing/undressing steps patient completed: Thread/unthread right sleeve of pullover  shirt/dresss;Thread/unthread left sleeve of pullover shirt/dress;Put head through opening of pull over shirt/dress;Pull shirt over trunk Upper body  dressing/undressing: 5: Set-up assist to: Obtain clothing/put away FIM - Lower Body Dressing/Undressing Lower body dressing/undressing steps patient completed: Thread/unthread right underwear leg;Thread/unthread left underwear leg;Pull underwear up/down;Thread/unthread right pants leg;Thread/unthread left pants leg;Pull pants up/down;Don/Doff right sock;Don/Doff left sock;Don/Doff right shoe;Don/Doff left shoe Lower body dressing/undressing: 4: Steadying Assist (with max cueing and mirrir)  FIM - Toileting Toileting: 2: Max-Patient completed 1 of 3 steps  FIM - Toilet Transfers Toilet Transfers: 4-From toilet/BSC: Min A (steadying Pt. > 75%)  FIM - Bed/Chair Transfer Bed/Chair Transfer Assistive Devices: Arm rests Bed/Chair Transfer: 4: Supine > Sit: Min A (steadying Pt. > 75%/lift 1 leg);5: Sit > Supine: Supervision (verbal cues/safety issues);2: Sit > Supine: Max A (lifting assist/Pt. 25-49%);2: Chair or W/C > Bed: Max A (lift and lower assist) (lift A to keep from falling R and lowering  A)  FIM - Locomotion: Wheelchair Locomotion: Wheelchair: 1: Travels less than 50 ft with minimal assistance (Pt.>75%) (with obstacles) FIM - Locomotion: Ambulation Ambulation/Gait Assistance: 1: +2 Total assist Locomotion: Ambulation: 1: Two helpers  Comprehension Comprehension Mode: Auditory Comprehension: 5-Understands basic 90% of the time/requires cueing < 10% of the time  Expression Expression Mode: Verbal Expression: 5-Expresses basic needs/ideas: With extra time/assistive device  Social Interaction Social Interaction: 5-Interacts appropriately 90% of the time - Needs monitoring or encouragement for participation or interaction.  Problem Solving Problem Solving: 4-Solves basic 75 - 89% of the time/requires cueing 10 - 24% of the  time  Memory Memory: 5-Recognizes or recalls 90% of the time/requires cueing < 10% of the time  2. Anticoagulation/DVT prophylaxis with Pharmaceutical: Lovenox Medical Problem List and Plan:  1. DVT Prophylaxis/Anticoagulation: Lovenox  2. Pain Management: N/A  3. Mood: Motivated. LSCW to follow up for formal evaluation. Mood fairly upbeat at present.  4. HTN: Monitor with bid checks continue  5. Dysphagia: Continue D3 diet with nectar liquids and full supervision to prevent aspiration. May need IVF at nights to prevent dehydration. Will monitor intake and lytes for hydration status. Check labs in the AM.  6. Impaired Fasting Glucose: Continue CBG checks to monitor for trends as Hgb AIC@6 .3. Anticipate higher BS for now due to sweetened liquids As primary source of fluid intake. Will use SSI for elevated BS    LOS (Days) 1 A FACE TO FACE EVALUATION WAS PERFORMED  , E 01/10/2012, 7:26 PM

## 2012-01-10 NOTE — Progress Notes (Signed)
Social Work Assessment and Plan Social Work Assessment and Plan  Patient Details  Name: Shane Juarez MRN: [id number] Date of Birth: [birth year]  Today's Date: 01/10/2012  Problem List:  Patient Active Problem List  Diagnoses  . CVA (cerebral vascular accident)  . HTN (hypertension)   Past Medical History:  Past Medical History  Diagnosis Date  . Hypertension    Past Surgical History: No past surgical history on file. Social History:  reports that he has never smoked. He does not have any smokeless tobacco history on file. He reports that he does not drink alcohol or use illicit drugs.  Family / Support Systems Marital Status: Married Patient Roles: Spouse;Parent;Other (Comment) (Employee) Spouse/Significant Other: Ethel-wife [phone] Other Supports: Friends Anticipated Caregiver: Wife Ability/Limitations of Caregiver: supervision-min level Caregiver Availability: 24/7 Family Dynamics: Wife and pt in good health and feel they will be fine at home.  Pt is hoping he will do well and get back to work  Social History Preferred language: English Religion: Baptist Cultural Background: No issues Education: HighSchool Read: Yes Write: Yes Employment Status: Employed (Part time-Body work on cars) Return to Work Plans: Unsure at this time Fish farm manager Issues: No issues Guardian/Conservator: None   Abuse/Neglect Physical Abuse: Denies Verbal Abuse: Denies Sexual Abuse: Denies Exploitation of patient/patient's resources: Denies Self-Neglect: Denies  Emotional Status Pt's affect, behavior adn adjustment status: Pt is motivated to improve and regain his independence.  he was very active and wants to remain so.  He has always been able to do for himself and others.  Wife reports this will be hard for him. Recent Psychosocial Issues: Doing well prior to admission Pyschiatric History: No history- Depression Screen score-1.  he feels optimisitc he will do well here and  is encouraged by his progress so far. Substance Abuse History: No issues  Patient / Family Perceptions, Expectations & Goals Pt/Family understanding of illness & functional limitations: Pt and wife can explain his stroke and deficits.  He is positive he will do well here.  Wife reports she will do what is needed to assist him at home. Premorbid pt/family roles/activities: Husband, Father, employee, Visual merchandiser, [employer], Friend, etc Anticipated changes in roles/activities/participation: Plans to resume Pt/family expectations/goals: Pt states: " I want to be able to do for myself, not used to asking others for their help."  Wife reports: " I hope he can move around by himself when he goes home."  [employer]: None Premorbid Home Care/DME Agencies: None Transportation available at discharge: [employer] referrals recommended: Support group (specify) (CVA Support [place])  Discharge Planning Living Arrangements: Spouse/significant other Support Systems: Spouse/significant other;Friends/neighbors;Parent;Church/faith community Type of Residence: Private residence Insurance Resources: Administrator (specify) Manufacturing systems engineer) Financial Resources: Employment;Social Theatre manager Screen Referred: No Living Expenses: Lives with family Money Management: Patient;Spouse Do you have any problems obtaining your medications?: No Home Management: Wife Patient/Family Preliminary Plans: Return home with wife who can assist him at discharge.  Will be up here to observe in therapies. Social Work Anticipated Follow Up Needs: HH/OP;Support Group  Clinical Impression Pleasant gentleman who is motivated to regain his independence.  He is an active man who has always been able to depend upon himself. Wife is support and willing to assist him.  Lucy Chris 01/10/2012, 11:20 AM

## 2012-01-10 NOTE — Evaluation (Signed)
Recreational Therapy Assessment and Plan  Patient Details  Name: Shane Juarez MRN: [id number] Date of Birth: Jul 08, 1930 Today's Date: 01/10/2012  Rehab Potential: Good ELOS: 2-3 weeks  Assessment Clinical Impression: Patient is a 78 y.o. year old male with recent admission to the hospital on 01/06/2012 with 3 day history of falls. Patient with stroke left acute/subacute right cerebellar with mass effect, compressing 4th ventricle. Patient with R PICA, and severe cerebellar edema. 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 Patient also has multilevel spondylosis. Patient transferred to CIR on 01/09/2012 .   Met with pt and pt's wife and discussed leisure interests.  Pt with limited interest in TR services, no formal TR treatment plan implemented at this time.  Will continue to monitor through team for future participation.  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

## 2012-01-10 NOTE — Plan of Care (Signed)
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  Functional Problem List  Patient demonstrates impairments in the following areas: Balance, Cognition, Motor, Perception, Safety and Sensory   Basic ADL's: eating, grooming, bathing, dressing and toileting Advanced ADL's: none  Transfers:  bed mobility, bed to chair, toilet, tub/shower, car, furniture and floor Locomotion:  ambulation, wheelchair mobility and stairs  Additional Impairments:  Functional use of upper extremity and Swallowing  Anticipated Outcomes Item Anticipated Outcome  Eating/Swallowing  Mod I  Basic self-care  Supervision / set up  Tolieting  supervision  Bowel/Bladder  Continent of bowel and bladder  Transfers  S  Locomotion  S ambulatory household environment  Communication  Mod I  Cognition  supervision  Pain  Less than or equal to 3  Safety/Judgment  No falls with injury. Modified Independent  Other     Therapy Plan: PT Frequency: 1-2 X/day, 60-90 minutes OT Frequency: 1-2 X/day, 60-90 minutes SLP Frequency: 1-2 X/day, 30-60 minutes;5 out of 7 days   Team Interventions: Item RN PT OT SLP SW TR Other  Self Care/Advanced ADL Retraining   x      Neuromuscular Re-Education  x x      Therapeutic Activities  x x x     UE/LE Strength Training/ROM         UE/LE Coordination Activities  x x      Visual/Perceptual Remediation/Compensation  x x      DME/Adaptive Equipment Instruction  x x      Therapeutic Exercise  x x      Balance/Vestibular Training  x x      Patient/Family Education  x x x     Cognitive Remediation/Compensation  x x x     Functional Mobility Training  x x      Ambulation/Gait Training  x       Museum/gallery curator  x        Wheelchair Propulsion/Positioning  x       Functional Tourist information centre manager Reintegration  x x x     Dysphagia/Aspiration Film/video editor         Bladder Management x        Bowel Management x        Disease Management/Prevention         Pain Management x        Medication Management x        Skin Care/Wound Management x        Splinting/Orthotics         Discharge Planning         Psychosocial Support                            Team Discharge Planning: Destination:  Home Projected Follow-up:  PT, OT  Home health then OP if possible; SLP TBD Projected Equipment Needs: OT/ PT-TBD,  Patient/family involved in discharge planning:  Yes  MD ELOS: 10 days Medical Rehab Prognosis:  Excellent Assessment: 78 year old male admitted for CVA now requiring CIR level PT, OT, 24 7 rehabilitation RN and M.D.

## 2012-01-10 NOTE — Evaluation (Signed)
Speech Language Pathology Assessment and Plan & Bedside Swallow Evaluaiton  Patient Details  Name: Shane Juarez MRN: [id number] Date of Birth: 08/08/1930  SLP Diagnosis: dysphagia, cognitive deficits Rehab Potential: Good ELOS: 2 weeks   Today's Date: 01/10/2012 Time: 1400-1445 Time Calculation (min): 45 min  Problem List:  Patient Active Problem List  Diagnoses  . CVA (cerebral vascular accident)  . HTN (hypertension)    Past Medical History:  Past Medical History  Diagnosis Date  . Hypertension    Past Surgical History: No past surgical history on file.  Assessment & Plan Clinical Impression: Shane Juarez is an 78 y.o. male with history of HTN, admitted on 01/06/12 with fall 3 days PTA with inability to walk. CCT with large acute/subacute right cerebellar infarct with mass effect compressing 4th ventricle. CT cervical spine with multilevel spondylosis. Follow up MRI/MRA brain done 01/09/12 revealing R-PICA infarct with petechial hemorrhage, severe cerebellar edema with mass effect, occlusion of distal RVA-?dissection. Neurology feels patient with CVA due to large vessel atherosclerosis leading to R-VA occlusion. Patient on ASA for CVA prophylaxis. MBS done 01/09/12 and diet modified to Dys.3 textures and nectar-thick liquids due to aspiration. Patient transferred to CIR on 01/09/2012 and upon evaluation presents with dysphagia and suspected baseline deficits, which impact his ability to recall and utilize safe swallow strategies.  As a result, this patient would benefit from skilled SLP services to address these deficits.   SLP - End of Session Patient left: in chair;with call bell in reach;with family/visitor present Nurse Communication: Cognitive/Linguistic strategies reviewed Assessment SLP Recommendation/Assessment: Patient will need skilled Speech Lanaguage Pathology Services during CIR admission No Skilled Speech Therapy:  (per wife's report) Rehab Potential: Good Barriers to  Discharge: None Therapy Diagnosis: Other (comment);Cognitive Impairments (dysphagia) SLP Plan SLP Frequency: 1-2 X/day, 30-60 minutes;5 out of 7 days Estimated Length of Stay: 2 weeks SLP Treatment/Interventions: Cognitive remediation/compensation;Cueing hierarchy;Dysphagia/aspiration precaution training;Environmental controls;Functional tasks;Internal/external aids;Patient/family education;Therapeutic Activities Recommendation Follow up Recommendations: None Equipment Recommended: None recommended by SLP  SLP Evaluation Precautions/Restrictions  Precautions Precaution Comments: Dys.3 textures and Nectar-thick liquids General  Amount of Missed SLP Time (min): 15 Minutes Pain Pain Assessment Pain Assessment: No/denies pain Pain Score: 0-No pain Prior Functioning Cognitive/Linguistic Baseline:  (wife reports WFL; however, SLP suspects deficits ) Cognition Overall Cognitive Status: Impaired at baseline Arousal/Alertness: Awake/alert Orientation Level: Oriented X4 Attention: Selective Selective Attention: Appears intact Memory: Impaired Memory Impairment: Decreased recall of new information Awareness: Impaired Problem Solving: Impaired Problem Solving Impairment: Verbal basic;Functional complex Executive Function: Self Monitoring;Self Correcting Self Monitoring: Impaired Self Monitoring Impairment: Functional basic (new) Self Correcting: Impaired Self Correcting Impairment: Functional basic (new) Safety/Judgment:  (wife reports he is "independent" ) Comprehension Auditory Comprehension Overall Auditory Comprehension: Appears within functional limits for tasks assessed (basic information) Visual Recognition/Discrimination Discrimination: Within Function Limits Reading Comprehension Reading Status: Within funtional limits (with basic) Expression Expression Primary Mode of Expression: Verbal Verbal Expression Overall Verbal Expression: Appears within functional limits for  tasks assessed Written Expression Dominant Hand: Right Written Expression:  (wife reports slighty decreased legibility) Oral/Motor Oral Motor/Sensory Function Overall Oral Motor/Sensory Function: Appears within functional limits for tasks assessed Motor Speech Overall Motor Speech: Appears within functional limits for tasks assessed Respiration: Within functional limits Phonation: Hoarse Resonance: Within functional limits Articulation: Within functional limitis Intelligibility: Intelligible Motor Planning: Witnin functional limits  See FIM for current functional status Refer to Care Plan for Long Term Goals  Recommendations for other services: None  Discharge Criteria: Patient will be discharged  from SLP if patient refuses treatment 3 consecutive times without medical reason, if treatment goals not met, if there is a change in medical status, if patient makes no progress towards goals or if patient is discharged from hospital.  The above assessment, treatment plan, treatment alternatives and goals were discussed and mutually agreed upon: by patient and by family   Clinical/Bedside Swallow Evaluation Patient Details  Name: Shane Juarez MRN: [id number] DOB: 08-31-1930 Today's Date: 01/10/2012  Past Medical History:  Past Medical History  Diagnosis Date  . Hypertension    Past Surgical History: No past surgical history on file. HPI:  [name] is a 78 y.o. male with a right cerebellar infarct with cerebeller cytotoxic edema, at risk for neuro worsening.  Stroke secondary to unknown embolic source.  MBSS completed 01/09/12 after patient developed a cough at bedside, which revealed inconsistent sensed aspiration of thin liquids.  Patient [facility] to [facility]/09/12.     Assessment/Recommendations/Treatment Plan SLP Assessment Clinical Impression Statement: Patient demonstrates [facility] oral-motor strenght and coordination and demonstrated timely and effective mastiaction; per MBSS  known pharyngeal based dysphagia with inconsistent sensed aspiraiton of thin liquids likely due to a combination of mildy reduced laryngeal contraction, likely poor vocal cord function as evidenced by hoarse vocal quality as well as impedence of large C5-6 osteophyte into the UES and pharyngeal space and contributing to post-swallow residue. A cued repeat dry swallow was successful in clearing pharyngeal residues, however bolus size modifications did not eliminate or reduce aspiration risk.   Risk for Aspiration: Mild  Swallow Evaluation Recommendations Diet Recommendations: Dysphagia 3 (Mechanical Soft);Nectar-thick liquid Liquid Administration via: Cup;No straw Medication Administration: Whole meds with puree Supervision: Patient able to self feed;Full supervision/cueing for compensatory strategies Compensations: Slow rate;Follow solids with liquid;Multiple dry swallows after each bite/sip Postural Changes and/or Swallow Maneuvers: Out of bed for meals Oral Care Recommendations: Oral care BID Other Recommendations: Order thickener from pharmacy;Prohibited food (jello, ice cream, thin soups);Remove water pitcher Follow up Recommendations: Other (comment) (TBD)  Treatment Plan Treatment Plan Recommendations: Therapy as outlined in treatment plan below Speech Therapy Frequency: min 5x/week Treatment Duration: 2 weeks Interventions: Pharyngeal strengthening exercises;Aspiration precaution training;Compensatory techniques;Patient/family education;Trials of upgraded texture/liquids;Diet toleration management by SLP;Group dysphagia treatment  Prognosis Prognosis for Safe Diet Advancement: Good Barriers to Reach Goals:  (suspected cognitive baseline deficits)  Individuals Consulted Consulted and Agree with Results and Recommendations: Patient;Family member/caregiver Family Member Consulted: wife  Swallowing Goals  See Cognitive-Linguistic Evaluation for details   Swallow Study Prior  Functional Status   Regular textures and thin liquids  General  Date of Onset: 01/06/12 HPI: Mr. Reymundo Winship is a 78 y.o. male with a right cerebellar infarct with cerebeller cytotoxic edema, at risk for neuro worsening.  Stroke secondary to unknown embolic source.  MBSS completed 01/09/12 after patient developed a cough at bedside, which revealed inconsistent sensed aspiration of thin liquids.  Patient transferred to [facility] 01/09/12.   Type of Study: Bedside swallow evaluation Previous Swallow Assessment: MBSS 01/09/12 Diet Prior to this Study: Dysphagia 3 (soft);Nectar-thick liquids Temperature Spikes Noted: No Respiratory Status: Room air History of Intubation: No Behavior/Cognition: Alert;Cooperative;Pleasant mood Oral Cavity - Dentition: Missing dentition Vision: Functional for self-feeding Patient Positioning: Upright in chair Baseline Vocal Quality: Hoarse Volitional Cough: Weak Volitional Swallow: Able to elicit  Oral Motor/Sensory Function  Overall Oral Motor/Sensory Function: Appears within functional limits for tasks assessed  Consistency Results  Ice Chips Ice chips: Not tested  Thin Liquid Thin Liquid: Not  tested  Nectar Thick Liquid Nectar Thick Liquid: Within functional limits Presentation: Cup Other Comments: consistent cues for 2 swallows  Honey Thick Liquid Honey Thick Liquid: Not tested  Puree Puree: Within functional limits Other Comments: cues for 2 swallows  Solid Solid: Within functional limits Presentation: Self Fed Other Comments: cues for 2 swallows, observed with soft solids   Fae Pippin, M.A., CCC-SLP [phone]  Kyilee Gregg 01/10/2012,3:27 PM

## 2012-01-10 NOTE — Progress Notes (Signed)
Patient information reviewed and entered into [facility] system by Lawton Dollinger, RN, CRRN, PPS Coordinator.  Information including medical coding and functional independence measure will be reviewed and updated through discharge.     Per nursing patient was given "Data Collection Information Summary for Patients in Inpatient Rehabilitation Facilities with attached "Privacy Act Statement-Health Care Records" upon admission.   

## 2012-01-11 LAB — GLUCOSE, CAPILLARY
Glucose-Capillary: 193 mg/dL — ABNORMAL HIGH (ref 70–99)
Glucose-Capillary: 169 mg/dL — ABNORMAL HIGH (ref 70–99)
Glucose-Capillary: 119 mg/dL — ABNORMAL HIGH (ref 70–99)
Glucose-Capillary: 164 mg/dL — ABNORMAL HIGH (ref 70–99)

## 2012-01-11 MED ORDER — INSULIN ASPART 100 UNIT/ML ~~LOC~~ SOLN
0.0000 [IU] | Freq: Three times a day (TID) | SUBCUTANEOUS | Status: DC
  Administered 2012-01-12: 3 [IU] via SUBCUTANEOUS
  Administered 2012-01-12 – 2012-01-14 (×6): 2 [IU] via SUBCUTANEOUS
  Administered 2012-01-15: 3 [IU] via SUBCUTANEOUS

## 2012-01-11 MED ORDER — INSULIN ASPART 100 UNIT/ML ~~LOC~~ SOLN
0.0000 [IU] | Freq: Three times a day (TID) | SUBCUTANEOUS | Status: DC
  Administered 2012-01-11: 3 [IU] via SUBCUTANEOUS

## 2012-01-11 MED ORDER — INSULIN ASPART 100 UNIT/ML ~~LOC~~ SOLN: 0.0000 [IU] | Freq: Every day | SUBCUTANEOUS | Status: DC

## 2012-01-11 MED ORDER — CYANOCOBALAMIN 1000 MCG/ML IJ SOLN
1000.0000 ug | Freq: Once | INTRAMUSCULAR | Status: AC
  Administered 2012-01-11: 1000 ug via INTRAMUSCULAR
  Filled 2012-01-11: qty 1

## 2012-01-11 MED ORDER — CYANOCOBALAMIN 1000 MCG/ML IJ SOLN: 1000.0000 ug | Freq: Once | INTRAMUSCULAR | Status: DC

## 2012-01-11 MED ORDER — ENSURE PUDDING PO PUDG
1.0000 | Freq: Two times a day (BID) | ORAL | Status: DC
  Administered 2012-01-11 – 2012-01-15 (×7): 1 via ORAL

## 2012-01-11 NOTE — Progress Notes (Signed)
Occupational Therapy Session Note  Patient Details  Name: Shane Juarez MRN: [id number] Date of Birth: [birth year]  Today's Date: 01/11/2012 Time: 4098-1191 Time Calculation (min): 61 min  Short Term Goals: Week 1:  OT Short Term Goal 1 (Week 1): Patient will perform toilet transfer with min assist OT Short Term Goal 2 (Week 1): Patient will sit unsupported at edge of bed to aide with lower body dressing (dynamic sitting balance) with min assist OT Short Term Goal 3 (Week 1): Patient will complete 1 of 3 toileting steps (max assist) OT Short Term Goal 4 (Week 1): Patient will shower seated with no greater than min assist.  Skilled Therapeutic Interventions/Progress Updates:    Worked on bathing and dressing at the sink sit to stand.  Pt able to use the LUE as an active assist with noted slight ataxia and decreased coordination.  At times demonstrated difficulty placing the soap back on the soap tray.  In standing demonstrates significant right side lean requiring mod assist to correct and be aware of it.  Also needs mod instructional cueing to maintain right knee extension in standing.  Will begin to bend and increase lean to the left secondary to his decreased awareness of it.  Therapist assisted with shaving secondary to LUE coordination and ataxia deficits and also pt not having an Neurosurgeon.    Therapy Documentation Precautions:  Precautions Precautions: Fall Precaution Comments: Dys.3 textures and Nectar-thick liquids Restrictions Weight Bearing Restrictions: No  Pain: Pain Assessment Pain Assessment: No/denies pain Pain Score: 0-No pain Faces Pain Scale: No hurt  See FIM for current functional status  Therapy/Group: Individual Therapy  Aaliyah Cancro 01/11/2012, 11:20 AM

## 2012-01-11 NOTE — Progress Notes (Signed)
Occupational Therapy Session Note  Patient Details  Name: Shane Juarez MRN: [id number] Date of Birth: Jun 22, 1930  Today's Date: 01/11/2012 Time: 4098-1191 Time Calculation (min): 48 min  Short Term Goals: Week 1:  OT Short Term Goal 1 (Week 1): Patient will perform toilet transfer with min assist OT Short Term Goal 2 (Week 1): Patient will sit unsupported at edge of bed to aide with lower body dressing (dynamic sitting balance) with min assist OT Short Term Goal 3 (Week 1): Patient will complete 1 of 3 toileting steps (max assist) OT Short Term Goal 4 (Week 1): Patient will shower seated with no greater than min assist.  Skilled Therapeutic Interventions/Progress Updates:    Pt seen for neuromuscular re-education session with focus on midline orientation, standing balance, and RUE coordination.  Began with pt in quadriped working on weightbearing over the RUE while reaching with the LUE.  Transitioned to tall kneeling and working on moving from kneeling too tall kneeling.  Pt needs at least min facilitation to maintain balance, noted increased lean and weightshift to the right during these transitions.  After rest breaks returned to quadriped having pt wash therapy mat with the RUE to promote closed chain weightbearing in hopes of decreasing ataxia and increasing coordination.  Transitioned to sitting secondary to pt fatiguing and worked on tossing and catching a Secondary school teacher with BUEs.  Pt with occasional drops and when thrown more toward his right side would need min assist to keep from falling over on his side.  Finished with pt in standing using rolling table to provide some support.  Focus on shifting his hips and shoulders to the right and pushing into target to help increase midline balance and decreased pushing to the right side.  Pt needs mod assist to maintain static standing.  Initially can stand with min assist but progress to mod assist.                                                                                                                                    Therapy Documentation Precautions:  Precautions Precautions: Fall Precaution Comments: Dys.3 textures and Nectar-thick liquids Restrictions Weight Bearing Restrictions: No  Vital Signs: Therapy Vitals Pulse Rate: 113  (During dynamic activity in therapy.) Oxygen Therapy SpO2: 96 % O2 Device: Nasal cannula;None (Room air) Pulse Oximetry Type: Intermittent Pain: Pain Assessment Pain Assessment: No/denies pain  See FIM for current functional status  Therapy/Group: Individual Therapy  Jove Beyl 01/11/2012, 2:53 PM

## 2012-01-11 NOTE — Progress Notes (Signed)
Occupational Therapy Note  Patient Details  Name: Shane Juarez MRN: [id number] Date of Birth: 1929-11-22 Today's Date: 01/11/2012 Group Therapy Time:  1200-1220  ( 20 min) Pain:  None  Pt. Engaged in [employer] today with focus on RUE coordination during eating, divided attention, and eating safely with bites and sips.  Wife present.   Humberto Seals 01/11/2012, 12:52 PM

## 2012-01-11 NOTE — Progress Notes (Signed)
Physical Therapy Note  Patient Details  Name: Shane Juarez MRN: [id number] Date of Birth: 09-15-1930 Today's Date: 01/11/2012  13:00-14:00 individual therapy pt denied pain  Initial stand pivot transfer max assist with heavy lean to rt and pushing wc backwards with sit to stand. Performed dynamic trunk reaching to left with 10 second holds to strengthen trunk and to aid ability to keep body in midline. Performed resting on left elbow and reaching across body with rt. Hand for UE use. Pt able to maintain midline in sitting after activity. Progressed to sit to squat placing horseshoe on basketball goal to left with light to moderate facilitation for anterior and left weight shift with sit to stand. Progressed to gait with eva walker  2 person to aid controlling walker with vc and light facilitation for rt. Hip stability in stance phase and vc for weight shift to left in stance x 30'. Pt denied dizziness throughout session.   [name] 01/11/2012, 2:42 PM

## 2012-01-11 NOTE — Progress Notes (Signed)
INITIAL ADULT NUTRITION ASSESSMENT Date: 01/11/2012   Time: 10:36 AM  Reason for Assessment: Dysphagia  ASSESSMENT: Male 78 y.o.  Dx: CVA (cerebral vascular accident)  Hx:  Past Medical History  Diagnosis Date  . Hypertension    No past surgical history on file.  Related Meds:     . sodium chloride   Intravenous Q12H  . amLODipine  5 mg Oral Daily  . aspirin  325 mg Oral Daily  . enoxaparin  40 mg Subcutaneous q1800  . insulin aspart  0-5 Units Subcutaneous QHS  . insulin aspart  0-9 Units Subcutaneous TID WC  . lisinopril  5 mg Oral Daily  . sennosides  10 mL Oral QHS   Ht: 5\' 9"  (175.3 cm)  Wt: 188 lb 11.4 oz (85.6 kg)  Ideal Wt: 72.7 kg % Ideal Wt: 118%  Wt Readings from Last 15 Encounters:  01/09/12 188 lb 11.4 oz (85.6 kg)  01/06/12 188 lb 4.4 oz (85.4 kg)  Usual Wt: 185 lb % Usual Wt: 102%  Body mass index is 27.87 kg/(m^2). Pt is overweight.  Food/Nutrition Related Hx: Regular diet PTA  Labs:  CMP     Component Value Date/Time   NA 140 01/10/2012 0700   K 4.0 01/10/2012 0700   CL 106 01/10/2012 0700   CO2 24 01/10/2012 0700   GLUCOSE 152* 01/10/2012 0700   BUN 17 01/10/2012 0700   CREATININE 1.09 01/10/2012 0700   CALCIUM 8.7 01/10/2012 0700   PROT 6.4 01/10/2012 0700   ALBUMIN 3.0* 01/10/2012 0700   AST 15 01/10/2012 0700   ALT 13 01/10/2012 0700   ALKPHOS 75 01/10/2012 0700   BILITOT 0.4 01/10/2012 0700   GFRNONAA 62* 01/10/2012 0700   GFRAA 72* 01/10/2012 0700   CBG (last 3)   Basename 01/11/12 0811 01/10/12 2056 01/10/12 1551  GLUCAP 193* 133* 154*   Lab Results  Component Value Date   HGBA1C 6.3* 01/07/2012     Intake/Output Summary (Last 24 hours) at 01/11/12 1037 Last data filed at 01/11/12 0700  Gross per 24 hour  Intake   1030 ml  Output      0 ml  Net   1030 ml   Diet Order: Dysphagia 3 with Nectar Liquids  Supplements/Tube Feeding: none  IVF:    Estimated Nutritional Needs:   Kcal: 1600 - 1800 kcal Protein: 75 - 90  grams Fluid: 1.7 - 1.9 L/d  MBS recommending Dys 3 diet with Nectar Liquids. Eating 25-50% of meals.  Pt reports that his appetite has been adequate. Wife reports pt eats lots of fruits and vegetables, chicken and fish; fairly health diet PTA.  Encouraged pt to drink thickened liquids as able to help with hydration; recommend continued intake of wide variety of foods.  NUTRITION DIAGNOSIS: -Swallowing difficulty (NI-1.1).  Status: Ongoing  RELATED TO: recent CVA  AS EVIDENCE BY: need for thickened liquids and ST; MBSS results.  MONITORING/EVALUATION(Goals): Goal: Pt to consume at least 75% of meals Monitor: PO intake, weights, labs, I/O's  EDUCATION NEEDS: -No education needs identified at this time  INTERVENTION: 1. Ensure Pudding PO BID for additional protein and kcal 2. RD to follow nutrition care plan  Dietitian #: [phone]  DOCUMENTATION CODES Per approved criteria  -Not Applicable    Adair Laundry 01/11/2012, 10:36 AM

## 2012-01-11 NOTE — Progress Notes (Signed)
Patient ID: Shane Juarez, male   DOB: 12/07/1929, 78 y.o.   MRN: [id number]  Subjective/Complaints: ROS; slept well.  No complaints of SOB, cough, CP or abdominal pain. Happy to have facial hair trimmed. Tired after therapies today Objective: Vital Signs: Blood pressure 139/80, pulse 87, temperature 98.6 F (37 C), temperature source Oral, resp. rate 19, height 5\' 9"  (1.753 m), weight 85.6 kg (188 lb 11.4 oz), SpO2 98.00%. No results found. Results for orders placed during the hospital encounter of 01/09/12 (from the past 72 hour(s))  GLUCOSE, CAPILLARY     Status: Abnormal   Collection Time   01/09/12  4:10 PM      Component Value Range Comment   Glucose-Capillary 164 (*) 70 - 99 (mg/dL)    Comment 1 Notify RN     GLUCOSE, CAPILLARY     Status: Abnormal   Collection Time   01/09/12  9:02 PM      Component Value Range Comment   Glucose-Capillary 159 (*) 70 - 99 (mg/dL)    Comment 1 Notify RN     CBC     Status: Abnormal   Collection Time   01/10/12  7:00 AM      Component Value Range Comment   WBC 7.3  4.0 - 10.5 (K/uL)    RBC 3.69 (*) 4.22 - 5.81 (MIL/uL)    Hemoglobin 11.9 (*) 13.0 - 17.0 (g/dL)    HCT 84.6 (*) 96.2 - 52.0 (%)    MCV 94.9  78.0 - 100.0 (fL)    MCH 32.2  26.0 - 34.0 (pg)    MCHC 34.0  30.0 - 36.0 (g/dL)    RDW 95.2  84.1 - 32.4 (%)    Platelets 222  150 - 400 (K/uL)   COMPREHENSIVE METABOLIC PANEL     Status: Abnormal   Collection Time   01/10/12  7:00 AM      Component Value Range Comment   Sodium 140  135 - 145 (mEq/L)    Potassium 4.0  3.5 - 5.1 (mEq/L)    Chloride 106  96 - 112 (mEq/L)    CO2 24  19 - 32 (mEq/L)    Glucose, Bld 152 (*) 70 - 99 (mg/dL)    BUN 17  6 - 23 (mg/dL)    Creatinine, Ser 4.01  0.50 - 1.35 (mg/dL)    Calcium 8.7  8.4 - 10.5 (mg/dL)    Total Protein 6.4  6.0 - 8.3 (g/dL)    Albumin 3.0 (*) 3.5 - 5.2 (g/dL)    AST 15  0 - 37 (U/L)    ALT 13  0 - 53 (U/L)    Alkaline Phosphatase 75  39 - 117 (U/L)    Total Bilirubin 0.4  0.3 -  1.2 (mg/dL)    GFR calc non Af Amer 62 (*) >90 (mL/min)    GFR calc Af Amer 72 (*) >90 (mL/min)   DIFFERENTIAL     Status: Normal   Collection Time   01/10/12  7:00 AM      Component Value Range Comment   Neutrophils Relative 63  43 - 77 (%)    Neutro Abs 4.6  1.7 - 7.7 (K/uL)    Lymphocytes Relative 28  12 - 46 (%)    Lymphs Abs 2.0  0.7 - 4.0 (K/uL)    Monocytes Relative 9  3 - 12 (%)    Monocytes Absolute 0.7  0.1 - 1.0 (K/uL)    Eosinophils Relative 0  0 - 5 (%)    Eosinophils Absolute 0.0  0.0 - 0.7 (K/uL)    Basophils Relative 0  0 - 1 (%)    Basophils Absolute 0.0  0.0 - 0.1 (K/uL)   GLUCOSE, CAPILLARY     Status: Abnormal   Collection Time   01/10/12  7:10 AM      Component Value Range Comment   Glucose-Capillary 139 (*) 70 - 99 (mg/dL)    Comment 1 Notify RN     GLUCOSE, CAPILLARY     Status: Abnormal   Collection Time   01/10/12 11:48 AM      Component Value Range Comment   Glucose-Capillary 158 (*) 70 - 99 (mg/dL)    Comment 1 Notify RN     GLUCOSE, CAPILLARY     Status: Abnormal   Collection Time   01/10/12  3:51 PM      Component Value Range Comment   Glucose-Capillary 154 (*) 70 - 99 (mg/dL)    Comment 1 Notify RN     GLUCOSE, CAPILLARY     Status: Abnormal   Collection Time   01/10/12  8:56 PM      Component Value Range Comment   Glucose-Capillary 133 (*) 70 - 99 (mg/dL)   GLUCOSE, CAPILLARY     Status: Abnormal   Collection Time   01/11/12  8:11 AM      Component Value Range Comment   Glucose-Capillary 193 (*) 70 - 99 (mg/dL)    Comment 1 Notify RN     GLUCOSE, CAPILLARY     Status: Abnormal   Collection Time   01/11/12 11:05 AM      Component Value Range Comment   Glucose-Capillary 169 (*) 70 - 99 (mg/dL)    Comment 1 Notify RN       Physical Exam  Constitutional: He is oriented to person, place, and time. He appears well-developed and well-nourished.  HENT:  Head: Normocephalic and atraumatic.  Right Ear: External ear normal.  Left Ear: External  ear normal.  Nose: Nose normal.  Mouth/Throat: Oropharynx is clear and moist.  Eyes: Conjunctivae and EOM are normal. Pupils are equal, round, and reactive to light.  Neck: Normal range of motion. Neck supple.  Cardiovascular: Normal rate and regular rhythm. Exam reveals no gallop and no friction rub.  No murmur heard.  Pulmonary/Chest: Effort normal and breath sounds normal. No respiratory distress.  Abdominal: Soft. Bowel sounds are normal. He exhibits no distension.  Neurological: He is alert and oriented to person, place, and time. A cranial nerve deficit (nystagmus to right lateral field) is present. Coordination abnormal.  Follows basic 1 and 2 step commands without difficulty. Speech soft and raspy (not baseline). Slight dysarthria. Right upper and lower limb ataxia moderate on finger to nose and heel shin testing. RUE with pronator drift. Strength fairly well preserved. Sensation grossly intact in all 4's and in face Voice is hoarse no dysarthria Skin: Skin is warm and dry.  Psychiatric: He has a normal mood and affect. His behavior is normal. Judgment and thought content normal  Assessment/Plan: 1. 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 Physiatrist is providing close team supervision and 24 hour management of active medical problems listed below. Physiatrist and rehab team continue to assess barriers to discharge/monitor patient progress toward functional and medical goals. FIM: FIM - Bathing Bathing Steps Patient Completed: Chest;Right Arm;Left  Arm;Abdomen;Right upper leg;Left upper leg;Right lower leg (including foot);Left lower leg (including foot) Bathing: 4: Min-Patient completes 8-9 48f 10 parts or 75+ percent  FIM - Upper Body Dressing/Undressing Upper body dressing/undressing steps patient completed: Thread/unthread right sleeve of  pullover shirt/dresss;Put head through opening of pull over shirt/dress;Pull shirt over trunk;Thread/unthread left sleeve of pullover shirt/dress Upper body dressing/undressing: 5: Set-up assist to: Obtain clothing/put away FIM - Lower Body Dressing/Undressing Lower body dressing/undressing steps patient completed: Thread/unthread right underwear leg;Thread/unthread left underwear leg;Pull underwear up/down;Thread/unthread right pants leg;Thread/unthread left pants leg;Pull pants up/down;Fasten/unfasten pants;Don/Doff right sock;Don/Doff left sock Lower body dressing/undressing: 4: Steadying Assist  FIM - Toileting Toileting: 2: Max-Patient completed 1 of 3 steps  FIM - Toilet Transfers Toilet Transfers: 4-From toilet/BSC: Min A (steadying Pt. > 75%)  FIM - Bed/Chair Transfer Bed/Chair Transfer Assistive Devices: Arm rests Bed/Chair Transfer: 4: Supine > Sit: Min A (steadying Pt. > 75%/lift 1 leg);5: Sit > Supine: Supervision (verbal cues/safety issues);2: Sit > Supine: Max A (lifting assist/Pt. 25-49%);2: Chair or W/C > Bed: Max A (lift and lower assist) (lift A to keep from falling R and lowering  A)  FIM - Locomotion: Wheelchair Locomotion: Wheelchair: 1: Travels less than 50 ft with minimal assistance (Pt.>75%) (with obstacles) FIM - Locomotion: Ambulation Ambulation/Gait Assistance: 1: +2 Total assist Locomotion: Ambulation: 1: Two helpers  Comprehension Comprehension Mode: Auditory Comprehension: 5-Understands basic 90% of the time/requires cueing < 10% of the time  Expression Expression Mode: Verbal Expression: 5-Expresses basic needs/ideas: With extra time/assistive device  Social Interaction Social Interaction: 5-Interacts appropriately 90% of the time - Needs monitoring or encouragement for participation or interaction.  Problem Solving Problem Solving: 4-Solves basic 75 - 89% of the time/requires cueing 10 - 24% of the time  Memory Memory: 5-Recognizes or recalls 90%  of the time/requires cueing < 10% of the time    Medical Problem List and Plan:  1. DVT Prophylaxis/Anticoagulation: Lovenox   2. Pain Management: N/A   3. Mood: Motivated. LSCW to follow up for formal evaluation. Mood fairly upbeat at present.   4. HTN: labile.  Started on lisinopril on 04/18. BP better this am.  Monitor with bid checks. Will check lytes Monday.   5. Dysphagia: Continue D3 diet with nectar liquids and full supervision to prevent aspiration. On IVF at nights to prevent dehydration. Monitor intake and check lytes for hydration status.    6. Impaired Fasting Glucose: Continue CBG checks to monitor for trends as Hgb AIC@6 .3. Anticipate higher BS for now due to sweetened liquids as primary source of fluid intake. Will change SSI to moderate scale for better BS control.  7. B 12 deficiency:  Will order dose today (4/19).  LOS (Days) 2 A FACE TO FACE EVALUATION WAS PERFORMED  Jacquelynn Cree 01/11/2012, 11:25 AM

## 2012-01-11 NOTE — Progress Notes (Addendum)
[facility] Individual Statement of Services  Patient Name:  Shane Juarez  Date:  01/11/2012  Welcome to the [facility].  Our goal is to provide you with an individualized program based on your diagnosis and situation, designed to meet your specific needs.  With this comprehensive rehabilitation program, you will be expected to participate in at least 3 hours of rehabilitation therapies Monday-Friday, with modified therapy programming on the weekends.  Your rehabilitation program will include the following services:  Physical Therapy (PT), Occupational Therapy (OT), Speech Therapy (ST), 24 hour per day rehabilitation nursing, Therapeutic Recreaction (TR), Case Management (RN and Child psychotherapist), Rehabilitation Medicine, Nutrition Services and Pharmacy Services  Weekly team conferences will be held on Wednesdays to discuss your progress.  Your RN Case Designer, television/film set will talk with you frequently to get your input and to update you on team discussions.  Team conferences with you and your family in attendance may also be held.  Expected length of stay: 2-3 weeks  Overall anticipated outcome: Supervision  Depending on your progress and recovery, your program may change.  Your RN Case Estate agent will coordinate services and will keep you informed of any changes.  Your RN Sports coach and SW names and contact numbers are listed  below.  The following services may also be recommended but are not provided by the [facility]:   Driving Evaluations  Home Health Rehabiltiation Services  Outpatient Rehabilitatation [facility]  Vocational Rehabilitation   Arrangements will be made to provide these services after discharge if needed.  Arrangements include referral to agencies that provide these services.  Your insurance has been verified to be: Medicare + [employer]  Your primary doctor is: Dr Catha Gosselin  Pertinent  information will be shared with your doctor and your insurance company.  Case Manager: Lutricia Horsfall, [facility] [phone]  Social Worker:  Dossie Der, [place] [phone]  Information discussed with and copy given to patient by: [name], 01/11/2012

## 2012-01-11 NOTE — Progress Notes (Signed)
Per State Regulation 482.30 This chart was reviewed for medical necessity with respect to the patient's Admission/Duration of stay. Participating in therapies. Poor appetite.  [name]                 Nurse Care Manager            Next Review Date: 01/14/12

## 2012-01-11 NOTE — Progress Notes (Signed)
Speech Language Pathology Daily Session Note  Patient Details  Name: Shane Juarez MRN: [id number] Date of Birth: 05/26/1930  Today's Date: 01/11/2012 Time: 1130-1200 Time Calculation (min): 30 min  Short Term Goals: Week 1: SLP Short Term Goal 1 (Week 1): Patient will consume Dys. textures and Nectar-thick liquids with supervision semantic cues to recall and utilize compensatory srategies  SLP Short Term Goal 2 (Week 1): Paitent will demonstrate hard effortful swallows with minimal assist semantic cues SLP Short Term Goal 3 (Week 1): Patient will consume trials of water followed by a hard effortful cough with moderate assist semantic cues  Skilled Therapeutic Interventions: Session focused on dysphagia treatment with minimal assist semantic cues to utilize double swallows with bites and sips of Dys.3 textures and nectar-thick liquids.  Wife present and returned demonstration of how to cue husband.    Daily Session Precautions/Restrictions    FIM:  Comprehension Comprehension Mode: Auditory Comprehension: 5-Understands basic 90% of the time/requires cueing < 10% of the time Expression Expression Mode: Verbal Expression: 5-Expresses basic needs/ideas: With extra time/assistive device Social Interaction Social Interaction: 5-Interacts appropriately 90% of the time - Needs monitoring or encouragement for participation or interaction. Problem Solving Problem Solving: 4-Solves basic 75 - 89% of the time/requires cueing 10 - 24% of the time Memory Memory: 5-Recognizes or recalls 90% of the time/requires cueing < 10% of the time FIM - Eating Eating Activity: 5: Supervision/cues;5: Set-up assist for open containers General    Pain Pain Assessment Pain Assessment: No/denies pain Pain Score: 0-No pain  Therapy/Group: Group Therapy  Charlane Ferretti., CCC-SLP [phone]  [name] 01/11/2012, 4:02 PM

## 2012-01-12 LAB — GLUCOSE, CAPILLARY
Glucose-Capillary: 149 mg/dL — ABNORMAL HIGH (ref 70–99)
Glucose-Capillary: 161 mg/dL — ABNORMAL HIGH (ref 70–99)
Glucose-Capillary: 110 mg/dL — ABNORMAL HIGH (ref 70–99)
Glucose-Capillary: 157 mg/dL — ABNORMAL HIGH (ref 70–99)

## 2012-01-12 MED ORDER — LISINOPRIL 10 MG PO TABS
10.0000 mg | ORAL_TABLET | Freq: Every day | ORAL | Status: DC
  Administered 2012-01-12: 5 mg via ORAL
  Administered 2012-01-13 – 2012-01-21 (×9): 10 mg via ORAL
  Filled 2012-01-12 (×12): qty 1

## 2012-01-12 NOTE — Progress Notes (Signed)
Patient ID: ALI MOHL, male   DOB: April 01, 1930, 78 y.o.   MRN: [id number] Patient ID: DIMITRI SHAKESPEARE, male   DOB: Jun 15, 1930, 78 y.o.   MRN: [id number]  Subjective/Complaints: 4/20.  Alert. Uneventful night without complaints this morning. Chest clear to auscultation. Cardiovascular exam revealed a normal rhythm without ectopics. Abdomen benign no distention. Extremities no edema  CBG (last 3)   Basename 01/12/12 0748 01/11/12 2102 01/11/12 1602  GLUCAP 149* 164* 119*   BP Readings from Last 3 Encounters:  01/12/12 170/93  01/09/12 160/89    impression - hypertension suboptimal control. Will up titrate lisinopril to 10 mg daily  Objective: Vital Signs: Blood pressure 170/93, pulse 85, temperature 98.4 F (36.9 C), temperature source Oral, resp. rate 19, height 5\' 9"  (1.753 m), weight 188 lb 11.4 oz (85.6 kg), SpO2 96.00%. No results found. Results for orders placed during the hospital encounter of 01/09/12 (from the past 72 hour(s))  GLUCOSE, CAPILLARY     Status: Abnormal   Collection Time   01/09/12  4:10 PM      Component Value Range Comment   Glucose-Capillary 164 (*) 70 - 99 (mg/dL)    Comment 1 Notify RN     GLUCOSE, CAPILLARY     Status: Abnormal   Collection Time   01/09/12  9:02 PM      Component Value Range Comment   Glucose-Capillary 159 (*) 70 - 99 (mg/dL)    Comment 1 Notify RN     CBC     Status: Abnormal   Collection Time   01/10/12  7:00 AM      Component Value Range Comment   WBC 7.3  4.0 - 10.5 (K/uL)    RBC 3.69 (*) 4.22 - 5.81 (MIL/uL)    Hemoglobin 11.9 (*) 13.0 - 17.0 (g/dL)    HCT 78.2 (*) 95.6 - 52.0 (%)    MCV 94.9  78.0 - 100.0 (fL)    MCH 32.2  26.0 - 34.0 (pg)    MCHC 34.0  30.0 - 36.0 (g/dL)    RDW 21.3  08.6 - 57.8 (%)    Platelets 222  150 - 400 (K/uL)   COMPREHENSIVE METABOLIC PANEL     Status: Abnormal   Collection Time   01/10/12  7:00 AM      Component Value Range Comment   Sodium 140  135 - 145 (mEq/L)    Potassium 4.0  3.5 - 5.1  (mEq/L)    Chloride 106  96 - 112 (mEq/L)    CO2 24  19 - 32 (mEq/L)    Glucose, Bld 152 (*) 70 - 99 (mg/dL)    BUN 17  6 - 23 (mg/dL)    Creatinine, Ser 4.69  0.50 - 1.35 (mg/dL)    Calcium 8.7  8.4 - 10.5 (mg/dL)    Total Protein 6.4  6.0 - 8.3 (g/dL)    Albumin 3.0 (*) 3.5 - 5.2 (g/dL)    AST 15  0 - 37 (U/L)    ALT 13  0 - 53 (U/L)    Alkaline Phosphatase 75  39 - 117 (U/L)    Total Bilirubin 0.4  0.3 - 1.2 (mg/dL)    GFR calc non Af Amer 62 (*) >90 (mL/min)    GFR calc Af Amer 72 (*) >90 (mL/min)   DIFFERENTIAL     Status: Normal   Collection Time   01/10/12  7:00 AM      Component Value Range Comment  Neutrophils Relative 63  43 - 77 (%)    Neutro Abs 4.6  1.7 - 7.7 (K/uL)    Lymphocytes Relative 28  12 - 46 (%)    Lymphs Abs 2.0  0.7 - 4.0 (K/uL)    Monocytes Relative 9  3 - 12 (%)    Monocytes Absolute 0.7  0.1 - 1.0 (K/uL)    Eosinophils Relative 0  0 - 5 (%)    Eosinophils Absolute 0.0  0.0 - 0.7 (K/uL)    Basophils Relative 0  0 - 1 (%)    Basophils Absolute 0.0  0.0 - 0.1 (K/uL)   GLUCOSE, CAPILLARY     Status: Abnormal   Collection Time   01/10/12  7:10 AM      Component Value Range Comment   Glucose-Capillary 139 (*) 70 - 99 (mg/dL)    Comment 1 Notify RN     GLUCOSE, CAPILLARY     Status: Abnormal   Collection Time   01/10/12 11:48 AM      Component Value Range Comment   Glucose-Capillary 158 (*) 70 - 99 (mg/dL)    Comment 1 Notify RN     GLUCOSE, CAPILLARY     Status: Abnormal   Collection Time   01/10/12  3:51 PM      Component Value Range Comment   Glucose-Capillary 154 (*) 70 - 99 (mg/dL)    Comment 1 Notify RN     GLUCOSE, CAPILLARY     Status: Abnormal   Collection Time   01/10/12  8:56 PM      Component Value Range Comment   Glucose-Capillary 133 (*) 70 - 99 (mg/dL)   GLUCOSE, CAPILLARY     Status: Abnormal   Collection Time   01/11/12  8:11 AM      Component Value Range Comment   Glucose-Capillary 193 (*) 70 - 99 (mg/dL)    Comment 1 Notify  RN     GLUCOSE, CAPILLARY     Status: Abnormal   Collection Time   01/11/12 11:05 AM      Component Value Range Comment   Glucose-Capillary 169 (*) 70 - 99 (mg/dL)    Comment 1 Notify RN     GLUCOSE, CAPILLARY     Status: Abnormal   Collection Time   01/11/12  4:02 PM      Component Value Range Comment   Glucose-Capillary 119 (*) 70 - 99 (mg/dL)    Comment 1 Notify RN     GLUCOSE, CAPILLARY     Status: Abnormal   Collection Time   01/11/12  9:02 PM      Component Value Range Comment   Glucose-Capillary 164 (*) 70 - 99 (mg/dL)   GLUCOSE, CAPILLARY     Status: Abnormal   Collection Time   01/12/12  7:48 AM      Component Value Range Comment   Glucose-Capillary 149 (*) 70 - 99 (mg/dL)    Comment 1 Notify RN       Physical Exam  Constitutional: He is oriented to person, place, and time. He appears well-developed and well-nourished.  HENT:  Head: Normocephalic and atraumatic.  Right Ear: External ear normal.  Left Ear: External ear normal.  Nose: Nose normal.  Mouth/Throat: Oropharynx is clear and moist.  Eyes: Conjunctivae and EOM are normal. Pupils are equal, round, and reactive to light.  Neck: Normal range of motion. Neck supple.  Cardiovascular: Normal rate and regular rhythm. Exam reveals no gallop and no friction rub.  No murmur heard.  Pulmonary/Chest: Effort normal and breath sounds normal. No respiratory distress.  Abdominal: Soft. Bowel sounds are normal. He exhibits no distension.  Neurological: He is alert and oriented to person, place, and time. A cranial nerve deficit (nystagmus to right lateral field) is present. Coordination abnormal.  Follows basic 1 and 2 step commands without difficulty. Speech soft and raspy (not baseline). Slight dysarthria. Right upper and lower limb ataxia moderate on finger to nose and heel shin testing. RUE with pronator drift. Strength fairly well preserved. Sensation grossly intact in all 4's and in face Voice is hoarse no  dysarthria Skin: Skin is warm and dry.  Psychiatric: He has a normal mood and affect. His behavior is normal. Judgment and thought content normal  Assessment/Plan: 1. 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 Physiatrist is providing close team supervision and 24 hour management of active medical problems listed below. Physiatrist and rehab team continue to assess barriers to discharge/monitor patient progress toward functional and medical goals. FIM: FIM - Bathing Bathing Steps Patient Completed: Chest;Right Arm;Left Arm;Abdomen;Front perineal area;Buttocks;Right upper leg;Left upper leg Bathing: 4: Min-Patient completes 8-9 93f 10 parts or 75+ percent  FIM - Upper Body Dressing/Undressing Upper body dressing/undressing steps patient completed: Thread/unthread right sleeve of pullover shirt/dresss;Put head through opening of pull over shirt/dress;Thread/unthread left sleeve of pullover shirt/dress;Pull shirt over trunk Upper body dressing/undressing: 5: Set-up assist to: Obtain clothing/put away FIM - Lower Body Dressing/Undressing Lower body dressing/undressing steps patient completed: Thread/unthread right underwear leg;Thread/unthread left underwear leg;Pull underwear up/down;Thread/unthread right pants leg;Thread/unthread left pants leg;Pull pants up/down Lower body dressing/undressing: 4: Steadying Assist  FIM - Toileting Toileting: 2: Max-Patient completed 1 of 3 steps  FIM - Toilet Transfers Toilet Transfers: 4-From toilet/BSC: Min A (steadying Pt. > 75%)  FIM - Bed/Chair Transfer Bed/Chair Transfer Assistive Devices: Arm rests Bed/Chair Transfer: 4: Sit > Supine: Min A (steadying pt. > 75%/lift 1 leg);3: Chair or W/C > Bed: Mod A (lift or lower assist)  FIM - Locomotion: Wheelchair Locomotion: Wheelchair: 0: Activity did not occur FIM -  Locomotion: Ambulation Locomotion: Ambulation Assistive Devices: Interior and spatial designer Ambulation/Gait Assistance: 1: +2 Total assist Locomotion: Ambulation: 1: Two helpers  Comprehension Comprehension Mode: Auditory Comprehension: 5-Understands basic 90% of the time/requires cueing < 10% of the time  Expression Expression Mode: Verbal Expression: 5-Expresses basic needs/ideas: With extra time/assistive device  Social Interaction Social Interaction: 5-Interacts appropriately 90% of the time - Needs monitoring or encouragement for participation or interaction.  Problem Solving Problem Solving: 4-Solves basic 75 - 89% of the time/requires cueing 10 - 24% of the time  Memory Memory: 5-Recognizes or recalls 90% of the time/requires cueing < 10% of the time    Medical Problem List and Plan:  1. DVT Prophylaxis/Anticoagulation: Lovenox   2. Pain Management: N/A   3. Mood: Motivated. LSCW to follow up for formal evaluation. Mood fairly upbeat at present.   4. HTN: labile.  Started on lisinopril on 04/18. BP better this am.  Monitor with bid checks. Will check lytes Monday.   5. Dysphagia: Continue D3 diet with nectar liquids and full supervision to prevent aspiration. On IVF at nights to prevent dehydration. Monitor intake and check lytes for hydration status.    6. Impaired Fasting Glucose: Continue CBG checks to monitor for trends as Hgb AIC@6 .3. Anticipate higher BS for now due to sweetened liquids as  primary source of fluid intake. Will change SSI to moderate scale for better BS control.  7. B 12 deficiency:  Will order dose today (4/19).  LOS (Days) 3 A FACE TO FACE EVALUATION WAS PERFORMED  DARSHAN, SOLANKI 01/12/2012, 9:39 AM

## 2012-01-12 NOTE — Progress Notes (Signed)
Occupational Therapy Session Note  Patient Details  Name: ELDRED SOOY MRN: [id number] Date of Birth: Jun 24, 1930  Today's Date: 01/12/2012 Time: 1430-1500 Time Calculation (min): 30 min  Short Term Goals: Week 1:  OT Short Term Goal 1 (Week 1): Patient will perform toilet transfer with min assist OT Short Term Goal 2 (Week 1): Patient will sit unsupported at edge of bed to aide with lower body dressing (dynamic sitting balance) with min assist OT Short Term Goal 3 (Week 1): Patient will complete 1 of 3 toileting steps (max assist) OT Short Term Goal 4 (Week 1): Patient will shower seated with no greater than min assist.  Skilled Therapeutic Interventions/Progress Updates:    Pt in bed resting and requested to use toilet.  Pt engaged in bed to w/c and w/c><toilet transfer with stand stand pivot transfer.  Pt required steady assist for clothing management with toileting.  Pt transitioned to standing activity placing BUE against wall for wall pushups to weight bear through RUE.  Pt completed 10 reps X 3.  Therapy Documentation Precautions:  Precautions Precautions: Fall Precaution Comments: Dys.3 textures and Nectar-thick liquids Restrictions Weight Bearing Restrictions: No Pain: Pain Assessment Pain Assessment: No/denies pain  See FIM for current functional status  Therapy/Group: Individual Therapy  Rich Brave 01/12/2012, 3:38 PM

## 2012-01-12 NOTE — Progress Notes (Signed)
Speech Language Pathology Daily Session Note  Patient Details  Name: Shane Juarez MRN: [id number] Date of Birth: Jun 08, 1930  Today's Date: 01/12/2012 Time: 1130-1215 Time Calculation (min): 45 min  Short Term Goals: Week 1:  SLP Short Term Goal 1 (Week 1): Patient will consume Dys. textures and Nectar-thick liquids with supervision semantic cues to recall and utilize compensatory srategies  SLP Short Term Goal 2 (Week 1): Paitent will demonstrate hard effortful swallows with minimal assist semantic cues SLP Short Term Goal 3 (Week 1): Patient will consume trials of water followed by a hard effortful cough with moderate assist semantic cues  Skilled Therapeutic Interventions: Treatment focused on functional application of swallow compensatory strategies in a group setting during a lunch meal.  SLP and wife provided minimal assist via verbal cues for use of 2nd swallow after each bite/sip throughout meal.  FIM:  Comprehension Comprehension Mode: Auditory Comprehension: 5-Understands complex 90% of the time/Cues < 10% of the time Expression Expression Mode: Verbal Expression: 5-Expresses complex 90% of the time/cues < 10% of the time Social Interaction Social Interaction: 5-Interacts appropriately 90% of the time - Needs monitoring or encouragement for participation or interaction. Problem Solving Problem Solving: 4-Solves basic 75 - 89% of the time/requires cueing 10 - 24% of the time Memory Memory: 5-Recognizes or recalls 90% of the time/requires cueing < 10% of the time FIM - Eating Eating Activity: 5: Supervision/cues   Pain Pain Assessment Pain Assessment: No/denies pain Pain Score: 0-No pain  Therapy/Group: Group Therapy  Myra Rude, M.S.,CCC-SLP Pager [phone] 01/12/2012, 1:53 PM

## 2012-01-12 NOTE — Progress Notes (Addendum)
Physical Therapy Session Note  Patient Details  Name: Shane Juarez MRN: [id number] Date of Birth: 1929-10-11  Today's Date: 01/12/2012 Time:0800-0855 and  4098-1191 Time Calculation (min): 55 min and 27 min  Short Term Goals: Week 1:  PT Short Term Goal 1 (Week 1): Pt will self correct LOB to R during dynamic sitting activity without A except cues PT Short Term Goal 2 (Week 1): Pt will stand during functional activity with 1 UE assist with <=mod A for 5+ minutes PT Short Term Goal 3 (Week 1): Pt will gait 50 ft with mod A LRAD PT Short Term Goal 4 (Week 1): Pt will propel w/c mod I on unit, >150 ft PT Short Term Goal 5 (Week 1): Pt will transfer stand pivot sit 2/3 trials with min A  Skilled Therapeutic Interventions/Progress Updates: No pain either treatment.  Treatment focus- 1st treatment- w/c mobility, transfers, neuromuscular re-education, gait training.  W/c mobility x 150' using bil UEs with close supervision, for impulsivity with speed, steering.    Bed mobility mod independent; bed> w/c transfer stand pivot to L with min assist.  W/c> mat transfer to L with min/mod assist.  Neuromuscular re-education in sitting via biased weight bearing L,  manual and verbal cues, demo, for midline orientation, trunk shortening/lengtheing for trunk and head righting reactions.  No LOB to L.    Therapeutic activities in sitting and standing using 1 UE for support, biased L, to construct PVC figure from picture, with mod cues for size perception of pieces, (no glasses) and use a socket wrench to adjust legrest lengths, with assistance.    Gait training with Carley Hammed walker x 25' x 2 with mod/max assist for wt shifting to L, VCs to increase BOS.  Wife brought in tennis shoes which are helpful, vs slippers with no tread.  Treatment focus- 2nd treatment: neuromuscular re-ed, midline orientation, w/c propulsion, therapeutic activity  W/c mobility as above.  Basic transfers stand/pivot to R with mod  assist due to leaning R.  Neuromuscular re-education in sitting and standing for midline orientation, R trunk activation, with visual feedback.  In standing, dynamic activity to facilitate wt shift to L, reaching with L hand for objects with reduced R LE wt bearing (R foot on 4" stool).  Pt intermittently achieved full LLE  wt bearing, but still says he feels he is falling; he required max assist for balance at times..  Wife and son, [employer] representative, attended session.          Therapy Documentation Precautions:  Precautions Precautions: Fall Precaution Comments: Dys.3 textures and Nectar-thick liquids Restrictions Weight Bearing Restrictions: No   Pain Assessment Pain Assessment: No/denies pain  See FIM for current functional status  Therapy/Group: Individual Therapy  Eliu Batch 01/12/2012, 11:43 AM

## 2012-01-12 NOTE — Progress Notes (Signed)
Occupational Therapy Session Note  Patient Details  Name: Shane Juarez MRN: [id number] Date of Birth: 1929-11-03  Today's Date: 01/12/2012 Time: 4098-1191  Short Term Goals: Week 1:  OT Short Term Goal 1 (Week 1): Patient will perform toilet transfer with min assist OT Short Term Goal 2 (Week 1): Patient will sit unsupported at edge of bed to aide with lower body dressing (dynamic sitting balance) with min assist OT Short Term Goal 3 (Week 1): Patient will complete 1 of 3 toileting steps (max assist) OT Short Term Goal 4 (Week 1): Patient will shower seated with no greater than min assist.  Skilled Therapeutic Interventions/Progress Updates:    ADL retraining w/c level at sink.  Pt declined shower this morning. Pt stated that he usually got down into the tub at home and soaked.  Pt completed bathing and dressing tasks with min assist for standing balance while standing.  Pt required min verbal cues for weight shifts to left with occasional tactile cues.  Pt transitioned to gym for standing activity with increased use of RUE to move checker pieces during game of checkers.  Pt required min verbal cues for weight shifts to left.  Pt exhibited some ataxia distally with RUE but pt stated that it was better than the previous day.  Pt stood for approx 10 mins before becoming fatigues.  Therapy Documentation Precautions:  Precautions Precautions: Fall Precaution Comments: Dys.3 textures and Nectar-thick liquids Restrictions Weight Bearing Restrictions: No   Pain: Pain Assessment Pain Assessment: No/denies pain Pain Score: 0-No pain  See FIM for current functional status  Therapy/Group: Individual Therapy  Rich Brave 01/12/2012, 12:20 PM

## 2012-01-13 LAB — GLUCOSE, CAPILLARY
Glucose-Capillary: 134 mg/dL — ABNORMAL HIGH (ref 70–99)
Glucose-Capillary: 149 mg/dL — ABNORMAL HIGH (ref 70–99)
Glucose-Capillary: 114 mg/dL — ABNORMAL HIGH (ref 70–99)
Glucose-Capillary: 156 mg/dL — ABNORMAL HIGH (ref 70–99)

## 2012-01-13 NOTE — Progress Notes (Signed)
Physical Therapy Note  Patient Details  Name: [name] MRN: [id number] Date of Birth: [birth year] Today's Date: 01/13/2012  1000-1045  (45 minutes) individual Pain: no complaint of pain Focus of treatment: Therapeutic activities to facilitate midline sitting /standing alignment vs rt lean Treatment: Transfers mod assist sit  To stand with moderate RT lean; tall kneeling with emphasis on weight shifts at hips; tall kneeling with reaching to left (pt with limited weight shift to left); sitting- pt sits with increased weight bearing on right hip; seated trunk rotation to left/right with loss of balance to right with rotation; forward partial sit to  stands to facilitate midline (decrease lean to right). Pt reports that he feels pushed to right.    Lory Galan,JIM 01/13/2012, 10:36 AM

## 2012-01-13 NOTE — Progress Notes (Signed)
Shane Juarez is a 78 y.o. male  Subjective: Doing well. Pt says BP always high.  Objective: Vital signs in last 24 hours: Temp:  [98 F (36.7 C)-98.3 F (36.8 C)] 98 F (36.7 C) (04/21 0625) Pulse Rate:  [85-86] 86  (04/21 0625) Resp:  [18-20] 18  (04/21 0625) BP: (163-165)/(76-82) 165/76 mmHg (04/21 0625) SpO2:  [97 %-99 %] 99 % (04/21 0625) Weight change:  Last BM Date: 01/11/12  Intake/Output from previous day: 04/20 0701 - 04/21 0700 In: 520 [P.O.:520] Out: 950 [Urine:950] Last cbgs: CBG (last 3)   Basename 01/13/12 1147 01/13/12 0710 01/12/12 2139  GLUCAP 149* 134* 157*     Physical Exam General: No apparent distress    Lungs: Normal effort. Lungs clear to auscultation, no crackles or wheezes. Cardiovascular: Regular rate and rhythm, no edema Musculoskeletal:  Neurovascularly intact Neurological: No new neurological deficits Wounds: N/A      Lab Results: Results for orders placed during the hospital encounter of 01/09/12 (from the past 24 hour(s))  GLUCOSE, CAPILLARY     Status: Abnormal   Collection Time   01/12/12  4:50 PM      Component Value Range   Glucose-Capillary 110 (*) 70 - 99 (mg/dL)   Comment 1 Notify RN    GLUCOSE, CAPILLARY     Status: Abnormal   Collection Time   01/12/12  9:39 PM      Component Value Range   Glucose-Capillary 157 (*) 70 - 99 (mg/dL)   Comment 1 Notify RN    GLUCOSE, CAPILLARY     Status: Abnormal   Collection Time   01/13/12  7:10 AM      Component Value Range   Glucose-Capillary 134 (*) 70 - 99 (mg/dL)  GLUCOSE, CAPILLARY     Status: Abnormal   Collection Time   01/13/12 11:47 AM      Component Value Range   Glucose-Capillary 149 (*) 70 - 99 (mg/dL)     Studies/Results: No results found.  Medications: I have reviewed the patient's current medications.  Assessment: 1. 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  Physiatrist is providing close team supervision and 24 hour management of active medical problems listed below.  Physiatrist and rehab team continue to assess barriers to discharge/monitor patient progress toward functional and medical goals.   Plan: 1. DVT Prophylaxis/Anticoagulation: Lovenox  2. Pain Management: N/A  3. Mood: Motivated. LSCW to follow up for formal evaluation. Mood upbeat at present.  4. HTN: labile. Started on lisinopril on 04/18. BP better 5. Dysphagia: Continue D3 diet with nectar liquids and full supervision to prevent aspiration. On IVF at nights to prevent dehydration. Monitor intake and check lytes for hydration status.  6. Impaired Fasting Glucose: Continue CBG checks to monitor for trends- Hgb A1C@6 .3. CBG's 134-157. 7. B 12 deficiency: given shot B12 01/11/12.  Length of stay, days: 4  IRETON,SUSAN C , PA-C 01/13/2012, 12:02 PM  I agree with above.  Rene Paci, MD 01/13/2012, 12:02 PM

## 2012-01-14 DIAGNOSIS — G8110 Spastic hemiplegia affecting unspecified side: Secondary | ICD-10-CM

## 2012-01-14 DIAGNOSIS — I6330 Cerebral infarction due to thrombosis of unspecified cerebral artery: Secondary | ICD-10-CM

## 2012-01-14 DIAGNOSIS — Z5189 Encounter for other specified aftercare: Secondary | ICD-10-CM

## 2012-01-14 LAB — BASIC METABOLIC PANEL
BUN: 18 mg/dL (ref 6–23)
CO2: 22 mEq/L (ref 19–32)
Calcium: 9 mg/dL (ref 8.4–10.5)
Chloride: 103 mEq/L (ref 96–112)
Creatinine, Ser: 1.04 mg/dL (ref 0.50–1.35)
GFR calc Af Amer: 76 mL/min — ABNORMAL LOW (ref >90)
GFR calc non Af Amer: 66 mL/min — ABNORMAL LOW (ref >90)
Glucose, Bld: 191 mg/dL — ABNORMAL HIGH (ref 70–99)
Potassium: 4.1 mEq/L (ref 3.5–5.1)
Sodium: 138 mEq/L (ref 135–145)

## 2012-01-14 LAB — GLUCOSE, CAPILLARY
Glucose-Capillary: 140 mg/dL — ABNORMAL HIGH (ref 70–99)
Glucose-Capillary: 131 mg/dL — ABNORMAL HIGH (ref 70–99)
Glucose-Capillary: 154 mg/dL — ABNORMAL HIGH (ref 70–99)

## 2012-01-14 MED ORDER — SODIUM CHLORIDE 0.9 % IV SOLN
INTRAVENOUS | Status: DC
  Administered 2012-01-14 – 2012-01-15 (×2): via INTRAVENOUS

## 2012-01-14 NOTE — Progress Notes (Signed)
Occupational Therapy Session Note  Patient Details  Name: Shane Juarez MRN: [id number] Date of Birth: 29-Feb-1930  Today's Date: 01/14/2012 Time: 0903-1000 Time Calculation (min): 57 min  Short Term Goals: Week 1:  OT Short Term Goal 1 (Week 1): Patient will perform toilet transfer with min assist OT Short Term Goal 2 (Week 1): Patient will sit unsupported at edge of bed to aide with lower body dressing (dynamic sitting balance) with min assist OT Short Term Goal 3 (Week 1): Patient will complete 1 of 3 toileting steps (max assist) OT Short Term Goal 4 (Week 1): Patient will shower seated with no greater than min assist.  Skilled Therapeutic Interventions/Progress Updates:    Bathing and dressing at the sink sit to stand.  Pt with moderate increased right lateral lean and frequent LOB when performing dynamic mobility to the sink for bathing.  Pt slightly better with static standing but with decreased awareness of the right knee bending into flexion resulting in greater right lateral lean and LOB.  Needs mod assist for dynamic balance when washing peri area or pulling pants over his hips.  Slight ataxia noted in the RUE with functional use but able to utilize at a diminished level for most selfcare tasks.  Worked on static standing at end of session, using counter to help balance.  Focused on lateral weightshifts to the left in standing.  Therapy Documentation Precautions:  Precautions Precautions: Fall Precaution Comments: Dys.3 textures and Nectar-thick liquids Restrictions Weight Bearing Restrictions: No  Pain: Pain Assessment Pain Assessment: No/denies pain ADL: See FIM for current functional status  Other Treatments: Treatments Neuromuscular Facilitation: Activity to increase lateral weight shifting;Activity to increase motor control;Activity to increase coordination   Therapy/Group: Individual Therapy  Tracie Dore 01/14/2012, 10:10 AM

## 2012-01-14 NOTE — Progress Notes (Signed)
Physical Therapy Note  Patient Details  Name: Shane Juarez MRN: [id number] Date of Birth: [birth year] Today's Date: 01/14/2012  13:00-14:00 individual therapy pt denied pain.  wc mobility supervision with both UEs. Pt able to park chair and remove legrest with vc for technique. Squat pivot to left close supervision and min assist to rt with pt falling to rt. Performed multiple squat transfers focusing on decreased UE dependence and maintaining midline when transferring to rt. Gait with rw heavy max assist with total assist needed to keep walker close to him due to over pushing with arms. Rt. Leg was adducting with poor weight shift to left. Worked on sidestepping and squatting to rt holding rail for cocontraction of hip muscles for abduction and weight shift to left. vc and tactile cues needed for weight shift to left and rt. Foot position.  [name] 01/14/2012, 2:58 PM

## 2012-01-14 NOTE — Progress Notes (Signed)
Occupational Therapy Session Note  Patient Details  Name: Shane Juarez MRN: [id number] Date of Birth: Feb 20, 1930  Today's Date: 01/14/2012 Time: 4010-2725 Time Calculation (min): 30 min  Short Term Goals: Week 1:  OT Short Term Goal 1 (Week 1): Patient will perform toilet transfer with min assist OT Short Term Goal 2 (Week 1): Patient will sit unsupported at edge of bed to aide with lower body dressing (dynamic sitting balance) with min assist OT Short Term Goal 3 (Week 1): Patient will complete 1 of 3 toileting steps (max assist) OT Short Term Goal 4 (Week 1): Patient will shower seated with no greater than min assist.  Skilled Therapeutic Interventions/Progress Updates:    Worked on static standing at the high low table having pt weightshift to a target on the left side.  Also had him reach for therapist's hand up and to the left to promote weightshift on the left side as well.  Pt still with significant lean to the right with static and dynamic standing.  Not as prevalent with sitting tasks however.  With both UEs on table pt can stand with min guard assist.  Once he has to let go with one UE he can require anywhere from min to mod assist.  Also incorporated Biodex for balance with emphasis on midline orientation with emphasis on weightshifting to the left.  Pt able to perform with increased time and min facilitation, holding onto the grab bars only.    Therapy Documentation Precautions:  Precautions Precautions: Fall Precaution Comments: Dys.3 textures and Nectar-thick liquids Restrictions Weight Bearing Restrictions: No  Pain: Pain Assessment Pain Assessment: No/denies pain ADL:  See FIM for current functional status  Therapy/Group: Individual Therapy  Geovannie Vilar 01/14/2012, 3:39 PM

## 2012-01-14 NOTE — Progress Notes (Signed)
Occupational Therapy Note  Patient Details  Name: Shane Juarez MRN: [id number] Date of Birth: 11/26/1929 Today's Date: 01/14/2012  Time: 1130-1155 Pt denies pain Group Therapy  Pt participated in self feeding group with focus on RUE use to open containers and for self feeding.  Pt required min verbal cues for swallowing strategies.  Pt using RUE at diminished level throughout meal.  Pt required min encouragement to increase PO/fluid intake.     Lavone Neri [facility] 01/14/2012, 3:48 PM

## 2012-01-14 NOTE — Progress Notes (Signed)
Per State Regulation 482.30 This chart was reviewed for medical necessity with respect to the patient's Admission/Duration of stay. Pt participating in therapies with steady progress. Overall total - max A with mobility. IV fluids at night for hydration.  [name]                 Nurse Care Manager            Next Review Date: 01/17/12

## 2012-01-14 NOTE — Progress Notes (Signed)
Patient ID: Shane Juarez, male   DOB: 1929-11-13, 78 y.o.   MRN: [id number]  Subjective/Complaints: ROS; slept well.  No complaints of SOB, cough, CP or abdominal painObjective: Vital Signs: Blood pressure 165/79, pulse 84, temperature 98.2 F (36.8 C), temperature source Oral, resp. rate 16, height 5\' 9"  (1.753 m), weight 85.6 kg (188 lb 11.4 oz), SpO2 96.00%. No results found. Results for orders placed during the hospital encounter of 01/09/12 (from the past 72 hour(s))  GLUCOSE, CAPILLARY     Status: Abnormal   Collection Time   01/11/12 11:05 AM      Component Value Range Comment   Glucose-Capillary 169 (*) 70 - 99 (mg/dL)    Comment 1 Notify RN     GLUCOSE, CAPILLARY     Status: Abnormal   Collection Time   01/11/12  4:02 PM      Component Value Range Comment   Glucose-Capillary 119 (*) 70 - 99 (mg/dL)    Comment 1 Notify RN     GLUCOSE, CAPILLARY     Status: Abnormal   Collection Time   01/11/12  9:02 PM      Component Value Range Comment   Glucose-Capillary 164 (*) 70 - 99 (mg/dL)   GLUCOSE, CAPILLARY     Status: Abnormal   Collection Time   01/12/12  7:48 AM      Component Value Range Comment   Glucose-Capillary 149 (*) 70 - 99 (mg/dL)    Comment 1 Notify RN     GLUCOSE, CAPILLARY     Status: Abnormal   Collection Time   01/12/12 11:32 AM      Component Value Range Comment   Glucose-Capillary 161 (*) 70 - 99 (mg/dL)    Comment 1 Notify RN     GLUCOSE, CAPILLARY     Status: Abnormal   Collection Time   01/12/12  4:50 PM      Component Value Range Comment   Glucose-Capillary 110 (*) 70 - 99 (mg/dL)    Comment 1 Notify RN     GLUCOSE, CAPILLARY     Status: Abnormal   Collection Time   01/12/12  9:39 PM      Component Value Range Comment   Glucose-Capillary 157 (*) 70 - 99 (mg/dL)    Comment 1 Notify RN     GLUCOSE, CAPILLARY     Status: Abnormal   Collection Time   01/13/12  7:10 AM      Component Value Range Comment   Glucose-Capillary 134 (*) 70 - 99 (mg/dL)     GLUCOSE, CAPILLARY     Status: Abnormal   Collection Time   01/13/12 11:47 AM      Component Value Range Comment   Glucose-Capillary 149 (*) 70 - 99 (mg/dL)   GLUCOSE, CAPILLARY     Status: Abnormal   Collection Time   01/13/12  4:40 PM      Component Value Range Comment   Glucose-Capillary 114 (*) 70 - 99 (mg/dL)   GLUCOSE, CAPILLARY     Status: Abnormal   Collection Time   01/13/12  8:31 PM      Component Value Range Comment   Glucose-Capillary 156 (*) 70 - 99 (mg/dL)    Comment 1 Notify RN     GLUCOSE, CAPILLARY     Status: Abnormal   Collection Time   01/14/12  7:07 AM      Component Value Range Comment   Glucose-Capillary 140 (*) 70 - 99 (mg/dL)  Comment 1 Notify RN       Physical Exam  Constitutional: He is oriented to person, place, and time. He appears well-developed and well-nourished.  HENT:  Head: Normocephalic and atraumatic.  Right Ear: External ear normal.  Left Ear: External ear normal.  Nose: Nose normal.  Mouth/Throat: Oropharynx is clear and moist.  Eyes: Conjunctivae and EOM are normal. Pupils are equal, round, and reactive to light.  Neck: Normal range of motion. Neck supple.  Cardiovascular: Normal rate and regular rhythm. Exam reveals no gallop and no friction rub.  No murmur heard.  Pulmonary/Chest: Effort normal and breath sounds normal. No respiratory distress.  Abdominal: Soft. Bowel sounds are normal. He exhibits no distension.  Neurological: He is alert and oriented to person, place, and time. A cranial nerve deficit (nystagmus to right lateral field) is present. Coordination abnormal.  Follows basic 1 and 2 step commands without difficulty. Speech soft and raspy (not baseline). Slight dysarthria. Right upper and lower limb ataxia moderate on finger to nose andmild heel shin testing. RUE with pronator drift. Strength fairly well preserved. Sensation grossly intact in all 4's and in face Voice is hoarse no dysarthria Skin: Skin is warm and dry.   Psychiatric: He has a normal mood and affect. His behavior is normal. Judgment and thought content normal  Assessment/Plan: 1. 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 Physiatrist is providing close team supervision and 24 hour management of active medical problems listed below. Physiatrist and rehab team continue to assess barriers to discharge/monitor patient progress toward functional and medical goals. FIM: FIM - Bathing Bathing Steps Patient Completed: Chest;Right Arm;Left Arm;Abdomen;Front perineal area;Buttocks;Right upper leg;Left upper leg;Right lower leg (including foot);Left lower leg (including foot) Bathing: 4: Steadying assist  FIM - Upper Body Dressing/Undressing Upper body dressing/undressing steps patient completed: Thread/unthread right sleeve of pullover shirt/dresss;Thread/unthread left sleeve of pullover shirt/dress;Put head through opening of pull over shirt/dress;Pull shirt over trunk Upper body dressing/undressing: 5: Set-up assist to: Obtain clothing/put away FIM - Lower Body Dressing/Undressing Lower body dressing/undressing steps patient completed: Thread/unthread right underwear leg;Thread/unthread left underwear leg;Pull underwear up/down;Thread/unthread right pants leg;Don/Doff left shoe;Don/Doff right shoe;Don/Doff left sock;Don/Doff right sock;Fasten/unfasten pants;Pull pants up/down;Thread/unthread left pants leg;Fasten/unfasten right shoe;Fasten/unfasten left shoe Lower body dressing/undressing: 4: Steadying Assist  FIM - Toileting Toileting: 2: Max-Patient completed 1 of 3 steps  FIM - Toilet Transfers Toilet Transfers: 4-From toilet/BSC: Min A (steadying Pt. > 75%)  FIM - Bed/Chair Transfer Bed/Chair Transfer Assistive Devices: Arm rests Bed/Chair Transfer: 3: Chair or W/C > Bed: Mod A (lift or lower assist);3: Bed  > Chair or W/C: Mod A (lift or lower assist)  FIM - Locomotion: Wheelchair Locomotion: Wheelchair: 5: Travels 150 ft or more: maneuvers on rugs and over door sills with supervision, cueing or coaxing FIM - Locomotion: Ambulation Locomotion: Ambulation Assistive Devices: Interior and spatial designer Ambulation/Gait Assistance: 1: +2 Total assist Locomotion: Ambulation: 1: Travels less than 50 ft with maximal assistance (Pt: 25 - 49%)  Comprehension Comprehension Mode: Auditory Comprehension: 5-Understands complex 90% of the time/Cues < 10% of the time  Expression Expression Mode: Verbal Expression: 5-Expresses complex 90% of the time/cues < 10% of the time  Social Interaction Social Interaction: 5-Interacts appropriately 90% of the time - Needs monitoring or encouragement for participation or interaction.  Problem Solving Problem Solving: 4-Solves basic 75 - 89% of the time/requires cueing 10 - 24% of  the time  Memory Memory: 5-Recognizes or recalls 90% of the time/requires cueing < 10% of the time    Medical Problem List and Plan:  1. DVT Prophylaxis/Anticoagulation: Lovenox   2. Pain Management: N/A   3. Mood: Motivated. LSCW to follow up for formal evaluation. Mood fairly upbeat at present.   4. HTN: labile.  Started on lisinopril on 04/18. BP better this am.  Monitor with bid checks. Will check lytes Monday.   5. Dysphagia: Continue D3 diet with nectar liquids and full supervision to prevent aspiration. On IVF at nights to prevent dehydration. Monitor intake and check lytes for hydration status.    6. Impaired Fasting Glucose: Continue CBG checks to monitor for trends as Hgb AIC@6 .3. Anticipate higher BS for now due to sweetened liquids as primary source of fluid intake. Will change SSI to moderate scale for better BS control.  7. B 12 deficiency: IM injection last done 4/19  LOS (Days) 5 A FACE TO FACE EVALUATION WAS PERFORMED  Kazi Montoro E 01/14/2012, 10:10 AM

## 2012-01-14 NOTE — Progress Notes (Signed)
Speech Language Pathology Daily Session Note  Patient Details  Name: Shane Juarez MRN: [id number] Date of Birth: 1930/04/07  Today's Date: 01/14/2012 Time: 4098-1191 Time Calculation (min): 15 min  Short Term Goals: Week 1: SLP Short Term Goal 1 (Week 1): Patient will consume Dys. textures and Nectar-thick liquids with supervision semantic cues to recall and utilize compensatory srategies  SLP Short Term Goal 2 (Week 1): Paitent will demonstrate hard effortful swallows with minimal assist semantic cues SLP Short Term Goal 3 (Week 1): Patient will consume trials of water followed by a hard effortful cough with moderate assist semantic cues  Skilled Therapeutic Interventions: Session focused on dysphagia treatment with wife present and cuing patient at what appears to be a minimal assist level with both semantic and visual cues.  SLP facilitated session by educating both wife and patient regarding dysphagia and expected progressing with recommendation to see patient outside of a meal to consume trials of water via cup.   Daily Session Pain Pain Assessment Pain Assessment: No/denies pain Pain Score: 0-No pain  Therapy/Group: Group Therapy  Charlane Ferretti., CCC-SLP [phone]  Shane Juarez 01/14/2012, 4:02 PM

## 2012-01-15 LAB — GLUCOSE, CAPILLARY
Glucose-Capillary: 122 mg/dL — ABNORMAL HIGH (ref 70–99)
Glucose-Capillary: 154 mg/dL — ABNORMAL HIGH (ref 70–99)
Glucose-Capillary: 144 mg/dL — ABNORMAL HIGH (ref 70–99)
Glucose-Capillary: 111 mg/dL — ABNORMAL HIGH (ref 70–99)
Glucose-Capillary: 192 mg/dL — ABNORMAL HIGH (ref 70–99)

## 2012-01-15 NOTE — Progress Notes (Signed)
Occupational Therapy Note  Patient Details  Name: COBAIN MORICI MRN: [id number] Date of Birth: 08-03-1930 Today's Date: 01/15/2012  Time:  1130-1205 Pt denies pain Group therapy  Pt participated in self feeding group with focus on BUE use for opening containiers, cutting food, and swallowing strategies.  Pt interacts appropriately with other participants.  Pt using RUE at diminished level.    Lavone Neri [facility] 01/15/2012, 2:28 PM

## 2012-01-15 NOTE — Progress Notes (Signed)
Physical Therapy Session Note  Patient Details  Name: Shane Juarez MRN: [id number] Date of Birth: [birth year]  Today's Date: 01/15/2012 Time: 4098-1191 Time Calculation (min): 45 min  Short Term Goals: Week 1:  PT Short Term Goal 1 (Week 1): Pt will self correct LOB to R during dynamic sitting activity without A except cues PT Short Term Goal 1 - Progress (Week 1): Met PT Short Term Goal 2 (Week 1): Pt will stand during functional activity with 1 UE assist with <=mod A for 5+ minutes PT Short Term Goal 2 - Progress (Week 1): Met PT Short Term Goal 3 (Week 1): Pt will gait 50 ft with mod A LRAD PT Short Term Goal 3 - Progress (Week 1): Progressing toward goal PT Short Term Goal 4 (Week 1): Pt will propel w/c mod I on unit, >150 ft PT Short Term Goal 4 - Progress (Week 1): Progressing toward goal PT Short Term Goal 5 (Week 1): Pt will transfer stand pivot sit 2/3 trials with min A PT Short Term Goal 5 - Progress (Week 1): Progressing toward goal  Skilled Therapeutic Interventions/Progress Updates:    NMR- postural control work in standing to increase pt self inititation (less instructional and more questioning cues) of falling to R- first had item on R for pt to press into to but actually did best with cane in L hand, reaching close S static balance at best, progressed to functionally using impaired RUE for reaching and other tasks, incuding cognitive math challenges with only 1 cue to identify all odd numbers, balance in standing improved with visual feedback of mirror today. Squats with weight shifting etc Progressed to gait with cane in L hand with mod A 32 ft x 2 and cues and assist for weight shift to L and self correction of lean, also tried metronome for cadence and cues for 3 point gait pattern.  Wife and daughter present during session.  Therapy Documentation Precautions:  Precautions Precautions: Fall Precaution Comments: Dys.3 textures and Nectar-thick  liquids Restrictions Weight Bearing Restrictions: No   Pain:none    Other Treatments:    See FIM for current functional status  Therapy/Group: Individual Therapy  Michaelene Song 01/15/2012, 3:58 PM

## 2012-01-15 NOTE — Progress Notes (Signed)
Patient ID: Shane Juarez, male   DOB: 11/25/1929, 78 y.o.   MRN: [id number] Subjective/Complaints: Do I need to go home on insulin?  Objective: Vital Signs: Blood pressure 158/76, pulse 83, temperature 98.6 F (37 C), temperature source Oral, resp. rate 16, height 5\' 9"  (1.753 m), weight 85.6 kg (188 lb 11.4 oz), SpO2 98.00%. No results found. Results for orders placed during the hospital encounter of 01/09/12 (from the past 72 hour(s))  GLUCOSE, CAPILLARY     Status: Abnormal   Collection Time   01/12/12 11:32 AM      Component Value Range Comment   Glucose-Capillary 161 (*) 70 - 99 (mg/dL)    Comment 1 Notify RN     GLUCOSE, CAPILLARY     Status: Abnormal   Collection Time   01/12/12  4:50 PM      Component Value Range Comment   Glucose-Capillary 110 (*) 70 - 99 (mg/dL)    Comment 1 Notify RN     GLUCOSE, CAPILLARY     Status: Abnormal   Collection Time   01/12/12  9:39 PM      Component Value Range Comment   Glucose-Capillary 157 (*) 70 - 99 (mg/dL)    Comment 1 Notify RN     GLUCOSE, CAPILLARY     Status: Abnormal   Collection Time   01/13/12  7:10 AM      Component Value Range Comment   Glucose-Capillary 134 (*) 70 - 99 (mg/dL)   GLUCOSE, CAPILLARY     Status: Abnormal   Collection Time   01/13/12 11:47 AM      Component Value Range Comment   Glucose-Capillary 149 (*) 70 - 99 (mg/dL)   GLUCOSE, CAPILLARY     Status: Abnormal   Collection Time   01/13/12  4:40 PM      Component Value Range Comment   Glucose-Capillary 114 (*) 70 - 99 (mg/dL)   GLUCOSE, CAPILLARY     Status: Abnormal   Collection Time   01/13/12  8:31 PM      Component Value Range Comment   Glucose-Capillary 156 (*) 70 - 99 (mg/dL)    Comment 1 Notify RN     GLUCOSE, CAPILLARY     Status: Abnormal   Collection Time   01/14/12  7:07 AM      Component Value Range Comment   Glucose-Capillary 140 (*) 70 - 99 (mg/dL)    Comment 1 Notify RN     GLUCOSE, CAPILLARY     Status: Abnormal   Collection Time   01/14/12 11:24 AM      Component Value Range Comment   Glucose-Capillary 131 (*) 70 - 99 (mg/dL)   BASIC METABOLIC PANEL     Status: Abnormal   Collection Time   01/14/12 12:58 PM      Component Value Range Comment   Sodium 138  135 - 145 (mEq/L)    Potassium 4.1  3.5 - 5.1 (mEq/L)    Chloride 103  96 - 112 (mEq/L)    CO2 22  19 - 32 (mEq/L)    Glucose, Bld 191 (*) 70 - 99 (mg/dL)    BUN 18  6 - 23 (mg/dL)    Creatinine, Ser 7.84  0.50 - 1.35 (mg/dL)    Calcium 9.0  8.4 - 10.5 (mg/dL)    GFR calc non Af Amer 66 (*) >90 (mL/min)    GFR calc Af Amer 76 (*) >90 (mL/min)   GLUCOSE, CAPILLARY  Status: Abnormal   Collection Time   01/14/12  4:09 PM      Component Value Range Comment   Glucose-Capillary 122 (*) 70 - 99 (mg/dL)    Comment 1 Notify RN     GLUCOSE, CAPILLARY     Status: Abnormal   Collection Time   01/14/12  8:59 PM      Component Value Range Comment   Glucose-Capillary 154 (*) 70 - 99 (mg/dL)    Comment 1 Notify RN     GLUCOSE, CAPILLARY     Status: Abnormal   Collection Time   01/15/12  7:10 AM      Component Value Range Comment   Glucose-Capillary 154 (*) 70 - 99 (mg/dL)    Comment 1 Notify RN       Physical Exam  Constitutional: He is oriented to person, place, and time. He appears well-developed and well-nourished.  HENT:  Head: Normocephalic and atraumatic.  Right Ear: External ear normal.  Left Ear: External ear normal.  Nose: Nose normal.  Mouth/Throat: Oropharynx is clear and moist.  Eyes: Conjunctivae and EOM are normal. Pupils are equal, round, and reactive to light.  Neck: Normal range of motion. Neck supple.  Cardiovascular: Normal rate and regular rhythm. Exam reveals no gallop and no friction rub.  No murmur heard.  Pulmonary/Chest: Effort normal and breath sounds normal. No respiratory distress.  Abdominal: Soft. Bowel sounds are normal. He exhibits no distension.  Neurological: He is alert and oriented to person, place, and time. A cranial  nerve deficit (nystagmus to right lateral field) is present. Coordination abnormal.  Follows basic 1 and 2 step commands without difficulty. Speech soft and raspy (not baseline). Slight dysarthria. Right upper and lower limb ataxia mild/ moderate on finger to nose andmild heel shin testing. RUE with pronator drift. Strength fairly well preserved. Sensation grossly intact in all 4's and in face Voice is hoarse no dysarthria Skin: Skin is warm and dry.  Psychiatric: He has a normal mood and affect. His behavior is normal. Judgment and thought content normal  Assessment/Plan: 1. 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 Physiatrist is providing close team supervision and 24 hour management of active medical problems listed below. Physiatrist and rehab team continue to assess barriers to discharge/monitor patient progress toward functional and medical goals. FIM: FIM - Bathing Bathing Steps Patient Completed: Chest;Right Arm;Left Arm;Abdomen;Front perineal area;Buttocks;Right upper leg;Left upper leg;Right lower leg (including foot);Left lower leg (including foot) Bathing: 4: Steadying assist  FIM - Upper Body Dressing/Undressing Upper body dressing/undressing steps patient completed: Thread/unthread right sleeve of pullover shirt/dresss;Thread/unthread left sleeve of pullover shirt/dress;Put head through opening of pull over shirt/dress;Pull shirt over trunk Upper body dressing/undressing: 5: Set-up assist to: Obtain clothing/put away FIM - Lower Body Dressing/Undressing Lower body dressing/undressing steps patient completed: Thread/unthread right underwear leg;Thread/unthread left underwear leg;Pull underwear up/down;Thread/unthread right pants leg;Don/Doff left shoe;Don/Doff right shoe;Don/Doff left sock;Don/Doff right sock;Fasten/unfasten pants;Pull pants  up/down;Thread/unthread left pants leg;Fasten/unfasten right shoe;Fasten/unfasten left shoe Lower body dressing/undressing: 4: Steadying Assist  FIM - Toileting Toileting Assistive Devices: Grab bar or rail for support Toileting: 1: Total-Patient completed zero steps, helper did all 3  FIM - Diplomatic Services operational officer Devices: Grab bars Toilet Transfers: 4-To toilet/BSC: Min A (steadying Pt. > 75%);4-From toilet/BSC: Min A (steadying Pt. > 75%)  FIM - Bed/Chair Transfer Bed/Chair Transfer Assistive Devices: Arm rests Bed/Chair Transfer:  4: Bed > Chair or W/C: Min A (steadying Pt. > 75%);4: Chair or W/C > Bed: Min A (steadying Pt. > 75%)  FIM - Locomotion: Wheelchair Locomotion: Wheelchair: 2: Travels 50 - 149 ft with supervision, cueing or coaxing FIM - Locomotion: Ambulation Locomotion: Ambulation Assistive Devices: Designer, industrial/product Ambulation/Gait Assistance: 2: Max assist Locomotion: Ambulation: 1: Travels less than 50 ft with maximal assistance (Pt: 25 - 49%)  Comprehension Comprehension Mode: Auditory Comprehension: 5-Understands complex 90% of the time/Cues < 10% of the time  Expression Expression Mode: Verbal Expression: 5-Expresses complex 90% of the time/cues < 10% of the time  Social Interaction Social Interaction: 5-Interacts appropriately 90% of the time - Needs monitoring or encouragement for participation or interaction.  Problem Solving Problem Solving: 5-Solves basic 90% of the time/requires cueing < 10% of the time  Memory Memory: 5-Recognizes or recalls 90% of the time/requires cueing < 10% of the time    Medical Problem List and Plan:  1. DVT Prophylaxis/Anticoagulation: Lovenox   2. Pain Management: N/A   3. Mood: Motivated. LSCW to follow up for formal evaluation. Mood fairly upbeat at present.   4. HTN: labile.  Started on lisinopril on 04/18. BP better this am.  Monitor with bid checks. Will check lytes Monday.   5. Dysphagia:  Continue D3 diet with nectar liquids and full supervision to prevent aspiration. On IVF at nights to prevent dehydration. Monitor intake and check lytes for hydration status.    6. Impaired Fasting Glucose: Continue CBG checks to monitor for trends as Hgb AIC@6 .3. Anticipate higher BS for now due to sweetened liquids as primary source of fluid intake. Will D/C SSI and monitor CBGs<180  7. B 12 deficiency: IM injection last done 4/19  LOS (Days) 6 A FACE TO FACE EVALUATION WAS PERFORMED  , E 01/15/2012, 9:48 AM

## 2012-01-15 NOTE — Progress Notes (Signed)
Speech Language Pathology Daily Session Note  Patient Details  Name: Shane Juarez MRN: [id number] Date of Birth: December 10, 1929  Today's Date: 01/15/2012 Time: 1300-1330 Time Calculation (min): 30 min  Short Term Goals: Week 1: SLP Short Term Goal 1 (Week 1): Patient will consume Dys. textures and Nectar-thick liquids with supervision semantic cues to recall and utilize compensatory srategies  SLP Short Term Goal 2 (Week 1): Paitent will demonstrate hard effortful swallows with minimal assist semantic cues SLP Short Term Goal 3 (Week 1): Patient will consume trials of water followed by a hard effortful cough with moderate assist semantic cues  Skilled Therapeutic Interventions: Session focused on educating patient and wife regarding water protocol and trials of water; patient was independent with oral care and consumed 10oz. water via cup with cough x2 due to swishing water prior to swallow and interference of ice cubes making it a mixed consistency.  Wife and patient abel to verbalize procedures for water protocol.  Recommend initiation of water protocol.   Daily Session Precautions/Restrictions    FIM:  Comprehension Comprehension Mode: Auditory Comprehension: 5-Follows basic conversation/direction: With extra time/assistive device Expression Expression Mode: Verbal Expression: 5-Expresses basic needs/ideas: With no assist Social Interaction Social Interaction: 5-Interacts appropriately 90% of the time - Needs monitoring or encouragement for participation or interaction. Problem Solving Problem Solving: 5-Solves basic problems: With no assist Memory Memory: 4-Recognizes or recalls 75 - 89% of the time/requires cueing 10 - 24% of the time FIM - Eating Eating Activity: 5: Supervision/cues General    Pain Pain Assessment Pain Assessment: No/denies pain Pain Score: 0-No pain  Therapy/Group: Individual Therapy  Charlane Ferretti., CCC-SLP [phone]  Raneen Jaffer 01/15/2012,  5:10 PM

## 2012-01-15 NOTE — Progress Notes (Signed)
Occupational Therapy Session Note  Patient Details  Name: JEYDEN COFFELT MRN: [id number] Date of Birth: [birth year]  Today's Date: 01/15/2012 Time: 1433-1500 Time Calculation (min): 27 min  Short Term Goals: Week 1:  OT Short Term Goal 1 (Week 1): Patient will perform toilet transfer with min assist OT Short Term Goal 2 (Week 1): Patient will sit unsupported at edge of bed to aide with lower body dressing (dynamic sitting balance) with min assist OT Short Term Goal 3 (Week 1): Patient will complete 1 of 3 toileting steps (max assist) OT Short Term Goal 4 (Week 1): Patient will shower seated with no greater than min assist.  Skilled Therapeutic Interventions/Progress Updates:    Worked on midline orientation and dynamic balance.  Began with focus on sit to stand transitions with emphasis on increased trunk flexion along with knee flexion to prevent LOB posteriorly.  Pt tends to exhibit decreased timing and sequence of knee and hip extension.  He tends to extend his knees and use his LEs to brace up against the mat surface to help bring him to standing.  While working on timing and sequencing also used Ship broker for feedback on midline orientation.  Pt still with significant lean to the right with any dynamic activity.  Able to transition sit to stand with min assist but then needs mod to max assist to take steps.  Pt does better with visual feedback using the mirror.    Therapy Documentation Precautions:  Precautions Precautions: Fall Precaution Comments: Dys.3 textures and Nectar-thick liquids Restrictions Weight Bearing Restrictions: No  Pain: Pain Assessment Pain Assessment: No/denies pain  See FIM for current functional status  Therapy/Group: Individual Therapy  Everli Rother 01/15/2012, 4:08 PM

## 2012-01-15 NOTE — Plan of Care (Signed)
Problem: RH SAFETY Goal: RH STG ADHERE TO SAFETY PRECAUTIONS W/ASSISTANCE/DEVICE STG Adhere to Safety Precautions With Assistance/Device. Modified independent  Outcome: Not Progressing Pt got up from toilet without assist today- staff must stay in room with pt while toileting

## 2012-01-15 NOTE — Progress Notes (Signed)
Speech Language Pathology Daily Session Note  Patient Details  Name: TARYN NAVE MRN: [id number] Date of Birth: 08/08/1930  Today's Date: 01/15/2012 Time: 4098-1191 Time Calculation (min): 20 min  Short Term Goals: Week 1: SLP Short Term Goal 1 (Week 1): Patient will consume Dys. textures and Nectar-thick liquids with supervision semantic cues to recall and utilize compensatory srategies  SLP Short Term Goal 2 (Week 1): Paitent will demonstrate hard effortful swallows with minimal assist semantic cues SLP Short Term Goal 3 (Week 1): Patient will consume trials of water followed by a hard effortful cough with moderate assist semantic cues  Skilled Therapeutic Interventions: Session focused on carryover of safe swallow compensatory strategies with supervision semantic cues to utilize double swallows with cough x1 during meal.  Daily Session FIM:  FIM - Eating Eating Activity: 5: Supervision/cues Pain Pain Assessment Pain Assessment: No/denies pain Pain Score: 0-No pain  Therapy/Group: Group Therapy  Charlane Ferretti., CCC-SLP [phone]  Tniyah Nakagawa 01/15/2012, 5:06 PM

## 2012-01-15 NOTE — Progress Notes (Signed)
Occupational Therapy Session Note  Patient Details  Name: LYNFORD ESPINOZA MRN: [id number] Date of Birth: [birth year]  Today's Date: 01/15/2012 Time: 1005-1103 Time Calculation (min): 58 min  Short Term Goals: Week 1:  OT Short Term Goal 1 (Week 1): Patient will perform toilet transfer with min assist OT Short Term Goal 2 (Week 1): Patient will sit unsupported at edge of bed to aide with lower body dressing (dynamic sitting balance) with min assist OT Short Term Goal 3 (Week 1): Patient will complete 1 of 3 toileting steps (max assist) OT Short Term Goal 4 (Week 1): Patient will shower seated with no greater than min assist.  Skilled Therapeutic Interventions/Progress Updates:    Bathing and dressing at the sink level.  Pt able to perform with overall min assist with static standing.  Prior to beginning bathing had pt ambulate with hand held assist to the sink and overall required max assist.  Demonstrated increased lean to the right and was unable to correct without max assist.  Pt able to let go with his right hand for bathing in standing and maintain balance with min assist.  Min to mod assist if he lets go with the LUE to wash and stabilizes with the right.  At conclusion of bathing and dressing had pt practice walking to the toilet using the RW.  Still needs max assist to perform with noted short step length, increased leaning to the right and pushing the walker too far out in front of him.  Discussed progress with wife and how he is a high fall risk at this time.  Therapy Documentation Precautions:  Precautions Precautions: Fall Precaution Comments: Dys.3 textures and Nectar-thick liquids Restrictions Weight Bearing Restrictions: No  Pain: Pain Assessment Pain Assessment: No/denies pain  See FIM for current functional status  Therapy/Group: Individual Therapy  Karston Hyland 01/15/2012, 12:55 PM

## 2012-01-16 LAB — GLUCOSE, CAPILLARY
Glucose-Capillary: 134 mg/dL — ABNORMAL HIGH (ref 70–99)
Glucose-Capillary: 159 mg/dL — ABNORMAL HIGH (ref 70–99)
Glucose-Capillary: 116 mg/dL — ABNORMAL HIGH (ref 70–99)
Glucose-Capillary: 122 mg/dL — ABNORMAL HIGH (ref 70–99)

## 2012-01-16 MED ORDER — AMLODIPINE BESYLATE 10 MG PO TABS
10.0000 mg | ORAL_TABLET | Freq: Every day | ORAL | Status: DC
  Administered 2012-01-17 – 2012-01-29 (×13): 10 mg via ORAL
  Filled 2012-01-16 (×15): qty 1

## 2012-01-16 NOTE — Patient Care Conference (Signed)
Inpatient RehabilitationTeam Conference Note Date: 01/16/2012   Time: 11:35 AM   Patient Name: Shane Juarez      Medical Record Number: [id number]  Date of Birth: [birth year] Sex: Male         Room/Bed: 4151/4151-01 Payor Info: Payor: MEDICARE  Plan: MEDICARE PART A AND B  Product Type: *No Product type*     Admitting Diagnosis: RT CVA  Admit Date/Time:  01/09/2012  2:57 PM Admission Comments: No comment available   Primary Diagnosis:  CVA (cerebral vascular accident) Principal Problem: CVA (cerebral vascular accident)  Patient Active Problem List  Diagnoses Date Noted  . CVA (cerebral vascular accident) 01/06/2012  . HTN (hypertension) 01/06/2012    Expected Discharge Date: Expected Discharge Date: 01/30/12  Team Members Present: Physician: Dr. Claudette Laws Case Manager Present: Lutricia Horsfall, RN Social Worker Present: Dossie Der, LCSW Nurse Present: Gregor Hams, RN PT Present: Wanda Plump, PT OT Present: Bretta Bang, Verlene Mayer, OT SLP Present: Fae Pippin, SLP     Current Status/Progress Goal Weekly Team Focus  Medical   poor midline orientation, dynamic balance poor  cont cueing for midline awaremness  prevent falls   Bowel/Bladder     Continent. LBM 4/24   Maintain continence     Swallow/Nutrition/ Hydration   Dys.3 textures and nectar-thick liquids with full supervision and water protocol  Mod I   trails of upgraded textures    ADL's   Pt currently is at a min assist level for LB selfcare, mod to max assist for stand pivot transfers to the toilet.  Demonstrates significant lean and LOB to the right with all dynamic standing tasks usually requiring max asisst to keep from falling.  Slight RUE ataxia but uses the UE at a diminished level for all selfcare tasks.    goals set at an overall supervision level for toileting and selfcare  Selfcare retraining, static and dynamic standing balance as it relates to selfcare tasks, RUE functional  coordination   Mobility   mod-max A gait in controlled environement, poor postural control; min A transfer, S w/c  min A gait and steps, S transfer  postural awarenss and control   Communication             Safety/Cognition/ Behavioral Observations  wife reports patient at baseline         Pain     denies        Skin     n/a           *See Interdisciplinary Assessment and Plan and progress notes for long and short-term goals  Barriers to Discharge: wife cannot handle balance issues, pre morbid mild cognitive issues    Possible Resolutions to Barriers:  cont PT/OT    Discharge Planning/Teaching Needs:  HOme with wife, who can provide assist.  Wife has been in to observe in therapies      Team Discussion:  Good progress. Hypertensive -- adjusting med. Stop IV fluids today. Push fluids. Start water protocol today. High fall risk. Needs belt when alone in chair.   Revisions to Treatment Plan: none    Continued Need for Acute Rehabilitation Level of Care: The patient requires daily medical management by a physician with specialized training in physical medicine and rehabilitation for the following conditions: Daily direction of a multidisciplinary physical rehabilitation program to ensure safe treatment while eliciting the highest outcome that is of practical value to the patient.: Yes Daily analysis of laboratory values and/or radiology reports with any  subsequent need for medication adjustment of medical intervention for : Neurological problems  [name] 01/17/2012, 4:18 PM

## 2012-01-16 NOTE — Care Management Note (Signed)
Met with pt and hie wife to report on team conference. Both pleased with pt's progress and in agreement with goals and target d/c date of 01/30/12.

## 2012-01-16 NOTE — Progress Notes (Signed)
Physical Therapy Note  Patient Details  Name: Shane Juarez MRN: [id number] Date of Birth: Dec 06, 1929 Today's Date: 01/16/2012  15:45-16:30 individual therapy pt denied pain.  quadraped for hip extension alternating LEs to work on weight shift, transition from high to half kneel with 1 UE support. Attempted to hold half kneel on left but pt unable to keep hip extended. Performed dynamic balance in high kneel to catch and throw ball including tapping ball on floor and returning to upright posture. After floor activities pt was able to ascend and descend steps with 2 rails alternating pattern min assist x 12, gait training controlled environment x 50' mod assist with rw and vc for weight shift and foot placement assistance provided to maintain walker placement.   [name] 01/16/2012, 4:42 PM

## 2012-01-16 NOTE — Progress Notes (Signed)
Occupational Therapy Weekly Progress Note  Patient Details  Name: Shane Juarez MRN: [id number] Date of Birth: June 19, 1930  Today's Date: 01/16/2012 Time: 7829-5621 Time Calculation (min): 39 min  Patient has met 2 of 4 short term goals.    Patient continues to demonstrate the following deficits: decreased static and dynamic standing balance, decreased LUE functional use and coordination, decreased midline orientation and awareness, and therefore will continue to benefit from skilled OT intervention to enhance overall performance with BADL.  Patient progressing toward long term goals..  Continue plan of care.  OT Short Term Goals Week 1:  OT Short Term Goal 1 (Week 1): Patient will perform toilet transfer with min assist OT Short Term Goal 1 - Progress (Week 1): Not met OT Short Term Goal 2 (Week 1): Patient will sit unsupported at edge of bed to aide with lower body dressing (dynamic sitting balance) with min assist OT Short Term Goal 2 - Progress (Week 1): Met OT Short Term Goal 3 (Week 1): Patient will complete 1 of 3 toileting steps (max assist) OT Short Term Goal 3 - Progress (Week 1): Met OT Short Term Goal 4 (Week 1): Patient will shower seated with no greater than min assist. OT Short Term Goal 4 - Progress (Week 1): Not met  Skilled Therapeutic Interventions/Progress Updates:    For therapy session worked on bathing and dressing at sink level per pt's choice.  Pt stood to perform all of UB bathing in order to focus more on static and dynamic standing balance.  Pt currently continues to be able to stand statically with both UEs supported on the sink with min guard assist.  When attempting to wash he needed min facilitation 70 % of the time but also needed episodes of mod assist secondary to LOB to the right and also forward.  Worked on standing with feedback from the mirror.  Pt able to maintain standing balance with min guard assist even without UE support for 1 min using the mirror  for feedback.  Pt even closed his eyes without UE support and also maintained his balance, but did not more swaying and forward backward shift on ankles.  Worked on dynamic balance with simulated toilet transfers with hand held assist on the right side.  Pt with severe lean to the right with mobility resulting in the need for max facilitation to keep his balance.  Gave pt a target to keep his lett hand on to help with midline orientation with mobility.  Did see some improvement with his balance while doing this but not after bringing his hand back down to his side.    Therapy Documentation Precautions:  Precautions Precautions: Fall Precaution Comments: water protocol  Restrictions Weight Bearing Restrictions: No Other Position/Activity Restrictions: Severe fall and lean to the right side with decreased awareness to fix it.  Pain: Pain Assessment Pain Assessment: No/denies pain ADL: ADL Eating: Set up Where Assessed-Eating: Wheelchair Grooming: Minimal assistance Where Assessed-Grooming: Standing at sink Where Assessed-Upper Body Bathing: Sitting at sink;Wheelchair Lower Body Bathing: Minimal assistance Where Assessed-Lower Body Bathing: Standing at sink Upper Body Dressing: Setup Where Assessed-Upper Body Dressing: Wheelchair;Sitting at sink Lower Body Dressing: Minimal assistance Where Assessed-Lower Body Dressing: Standing at sink;Sitting at sink;Wheelchair Toileting: Moderate assistance Where Assessed-Toileting: Teacher, adult education: Moderate assistance Toilet Transfer Method: Stand pivot Toilet Transfer Equipment: Psychiatric nurse: Not assessed ADL Comments: Pt prefers to sponge bathe at the sink vs showring.  Still with significant lean to the left  side in standing requiring mod assist at times to self correct.   See FIM for current functional status  Therapy/Group: Individual Therapy  Jaimeson Gopal 01/16/2012, 11:04 AM

## 2012-01-16 NOTE — Progress Notes (Signed)
Speech Language Pathology Daily Session Note  Patient Details  Name: Shane Juarez MRN: [id number] Date of Birth: June 26, 1930  Today's Date: 01/16/2012 Time: 1130-1200 Time Calculation (min): 30 min  Short Term Goals: Week 1: SLP Short Term Goal 1 (Week 1): Patient will consume Dys. textures and Nectar-thick liquids with supervision semantic cues to recall and utilize compensatory srategies  SLP Short Term Goal 2 (Week 1): Paitent will demonstrate hard effortful swallows with minimal assist semantic cues SLP Short Term Goal 3 (Week 1): Patient will consume trials of water followed by a hard effortful cough with moderate assist semantic cues  Skilled Therapeutic Interventions: Session focused on skilled dysphagia treatment with supervision semantic cues to utilize extra dry swallow with bites and sips and cough x1 throughout whole meal suspected to be due to penetration that patient appeared to clear with coughs. Patient also requested water after lunch and SLP re-educated him on the procedures for the water protocol and that he needed to wait for 30 minutes before he can have water.   Daily Session Precautions/Restrictions    FIM:  Comprehension Comprehension Mode: Auditory Comprehension: 5-Follows basic conversation/direction: With extra time/assistive device Expression Expression Mode: Verbal Expression: 5-Expresses basic needs/ideas: With no assist Social Interaction Social Interaction: 6-Interacts appropriately with others with medication or extra time (anti-anxiety, antidepressant). Problem Solving Problem Solving: 5-Solves basic 90% of the time/requires cueing < 10% of the time Memory Memory: 4-Recognizes or recalls 75 - 89% of the time/requires cueing 10 - 24% of the time FIM - Eating Eating Activity: 5: Supervision/cues General    Pain Pain Assessment Pain Assessment: No/denies pain Pain Score: 0-No pain  Therapy/Group: Group Therapy  Charlane Ferretti.,  CCC-SLP [phone]  [name] 01/16/2012, 3:07 PM

## 2012-01-16 NOTE — Progress Notes (Signed)
Patient ID: Shane Juarez, male   DOB: Jun 23, 1930, 78 y.o.   MRN: [id number] Subjective/Complaints: Do I need to go home on insulin?  Objective: Vital Signs: Blood pressure 168/80, pulse 78, temperature 97.9 F (36.6 C), temperature source Oral, resp. rate 20, height 5\' 9"  (1.753 m), weight 85.6 kg (188 lb 11.4 oz), SpO2 94.00%. No results found. Results for orders placed during the hospital encounter of 01/09/12 (from the past 72 hour(s))  GLUCOSE, CAPILLARY     Status: Abnormal   Collection Time   01/13/12 11:47 AM      Component Value Range Comment   Glucose-Capillary 149 (*) 70 - 99 (mg/dL)   GLUCOSE, CAPILLARY     Status: Abnormal   Collection Time   01/13/12  4:40 PM      Component Value Range Comment   Glucose-Capillary 114 (*) 70 - 99 (mg/dL)   GLUCOSE, CAPILLARY     Status: Abnormal   Collection Time   01/13/12  8:31 PM      Component Value Range Comment   Glucose-Capillary 156 (*) 70 - 99 (mg/dL)    Comment 1 Notify RN     GLUCOSE, CAPILLARY     Status: Abnormal   Collection Time   01/14/12  7:07 AM      Component Value Range Comment   Glucose-Capillary 140 (*) 70 - 99 (mg/dL)    Comment 1 Notify RN     GLUCOSE, CAPILLARY     Status: Abnormal   Collection Time   01/14/12 11:24 AM      Component Value Range Comment   Glucose-Capillary 131 (*) 70 - 99 (mg/dL)   BASIC METABOLIC PANEL     Status: Abnormal   Collection Time   01/14/12 12:58 PM      Component Value Range Comment   Sodium 138  135 - 145 (mEq/L)    Potassium 4.1  3.5 - 5.1 (mEq/L)    Chloride 103  96 - 112 (mEq/L)    CO2 22  19 - 32 (mEq/L)    Glucose, Bld 191 (*) 70 - 99 (mg/dL)    BUN 18  6 - 23 (mg/dL)    Creatinine, Ser 5.78  0.50 - 1.35 (mg/dL)    Calcium 9.0  8.4 - 10.5 (mg/dL)    GFR calc non Af Amer 66 (*) >90 (mL/min)    GFR calc Af Amer 76 (*) >90 (mL/min)   GLUCOSE, CAPILLARY     Status: Abnormal   Collection Time   01/14/12  4:09 PM      Component Value Range Comment   Glucose-Capillary 122  (*) 70 - 99 (mg/dL)    Comment 1 Notify RN     GLUCOSE, CAPILLARY     Status: Abnormal   Collection Time   01/14/12  8:59 PM      Component Value Range Comment   Glucose-Capillary 154 (*) 70 - 99 (mg/dL)    Comment 1 Notify RN     GLUCOSE, CAPILLARY     Status: Abnormal   Collection Time   01/15/12  7:10 AM      Component Value Range Comment   Glucose-Capillary 154 (*) 70 - 99 (mg/dL)    Comment 1 Notify RN     GLUCOSE, CAPILLARY     Status: Abnormal   Collection Time   01/15/12 11:25 AM      Component Value Range Comment   Glucose-Capillary 144 (*) 70 - 99 (mg/dL)    Comment 1 Notify  RN     GLUCOSE, CAPILLARY     Status: Abnormal   Collection Time   01/15/12  4:11 PM      Component Value Range Comment   Glucose-Capillary 111 (*) 70 - 99 (mg/dL)    Comment 1 Notify RN     GLUCOSE, CAPILLARY     Status: Abnormal   Collection Time   01/15/12  9:07 PM      Component Value Range Comment   Glucose-Capillary 192 (*) 70 - 99 (mg/dL)    Comment 1 Notify RN     GLUCOSE, CAPILLARY     Status: Abnormal   Collection Time   01/16/12  7:17 AM      Component Value Range Comment   Glucose-Capillary 134 (*) 70 - 99 (mg/dL)    Comment 1 Notify RN       Physical Exam  Constitutional: He is oriented to person, place, and time. He appears well-developed and well-nourished.  HENT:  Head: Normocephalic and atraumatic.  Right Ear: External ear normal.  Left Ear: External ear normal.  Nose: Nose normal.  Mouth/Throat: Oropharynx is clear and moist.  Eyes: Conjunctivae and EOM are normal. Pupils are equal, round, and reactive to light.  Neck: Normal range of motion. Neck supple.  Cardiovascular: Normal rate and regular rhythm. Exam reveals no gallop and no friction rub.  No murmur heard.  Pulmonary/Chest: Effort normal and breath sounds normal. No respiratory distress.  Abdominal: Soft. Bowel sounds are normal. He exhibits no distension.  Neurological: He is alert and oriented to person, place,  and time. A cranial nerve deficit (nystagmus to right lateral field) is present. Coordination abnormal.  Follows basic 1 and 2 step commands without difficulty. Speech soft and raspy (not baseline). Slight dysarthria. Right upper and lower limb ataxia mild/ moderate on finger to nose andmild heel shin testing. RUE with pronator drift. Strength fairly well preserved. Sensation grossly intact in all 4's and in face Voice is hoarse no dysarthria Skin: Skin is warm and dry.  Psychiatric: He has a normal mood and affect. His behavior is normal. Judgment and thought content normal  Assessment/Plan: 1. 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 Physiatrist is providing close team supervision and 24 hour management of active medical problems listed below. Physiatrist and rehab team continue to assess barriers to discharge/monitor patient progress toward functional and medical goals. FIM: FIM - Bathing Bathing Steps Patient Completed: Chest;Right Arm;Abdomen;Front perineal area;Buttocks;Right upper leg;Left lower leg (including foot);Right lower leg (including foot);Left upper leg Bathing: 4: Steadying assist  FIM - Upper Body Dressing/Undressing Upper body dressing/undressing steps patient completed: Thread/unthread right sleeve of pullover shirt/dresss;Thread/unthread left sleeve of pullover shirt/dress;Put head through opening of pull over shirt/dress;Pull shirt over trunk Upper body dressing/undressing: 5: Supervision: Safety issues/verbal cues FIM - Lower Body Dressing/Undressing Lower body dressing/undressing steps patient completed: Thread/unthread right underwear leg;Pull underwear up/down;Thread/unthread right pants leg;Thread/unthread left pants leg;Thread/unthread left underwear leg;Don/Doff left sock;Fasten/unfasten right shoe;Don/Doff right  sock;Fasten/unfasten pants;Don/Doff left shoe;Don/Doff right shoe;Pull pants up/down;Fasten/unfasten left shoe Lower body dressing/undressing: 4: Steadying Assist  FIM - Toileting Toileting Assistive Devices: Grab bar or rail for support Toileting: 1: Total-Patient completed zero steps, helper did all 3  FIM - Diplomatic Services operational officer Devices: Grab bars Toilet Transfers: 4-To toilet/BSC: Min A (steadying Pt. > 75%);4-From toilet/BSC: Min A (steadying Pt. > 75%)  FIM - Bed/Chair Transfer  Bed/Chair Transfer Assistive Devices: Arm rests Bed/Chair Transfer: 4: Chair or W/C > Bed: Min A (steadying Pt. > 75%);4: Bed > Chair or W/C: Min A (steadying Pt. > 75%)  FIM - Locomotion: Wheelchair Locomotion: Wheelchair: 2: Travels 50 - 149 ft with supervision, cueing or coaxing FIM - Locomotion: Ambulation Locomotion: Ambulation Assistive Devices: Emergency planning/management officer Ambulation/Gait Assistance: 3: Mod assist Locomotion: Ambulation: 1: Travels less than 50 ft with moderate assistance (Pt: 50 - 74%)  Comprehension Comprehension Mode: Auditory Comprehension: 5-Follows basic conversation/direction: With extra time/assistive device  Expression Expression Mode: Verbal Expression: 5-Expresses basic needs/ideas: With no assist  Social Interaction Social Interaction: 5-Interacts appropriately 90% of the time - Needs monitoring or encouragement for participation or interaction.  Problem Solving Problem Solving: 5-Solves basic problems: With no assist  Memory Memory: 4-Recognizes or recalls 75 - 89% of the time/requires cueing 10 - 24% of the time    Medical Problem List and Plan:  1. DVT Prophylaxis/Anticoagulation: Lovenox   2. Pain Management: N/A   3. Mood: Motivated. LSCW to follow up for formal evaluation. Mood fairly upbeat at present.   4. HTN: labile.  Started on lisinopril and norvascon 04/18.still with systolic elevation.    Monitor with bid checks.lytes ok but intake  poor will increase Norvasc to 10mg  5. Dysphagia: Continue D3 diet with nectar liquids and full supervision to prevent aspiration. D/C IVF monitor lytes Monitor intake and check lytes for hydration status.    6. Impaired Fasting Glucose: Continue CBG checks to monitor for trends as Hgb AIC@6 .3. Anticipate higher BS for now due to sweetened liquids as primary source of fluid intake. Will D/C SSI and monitor CBGs<180  7. B 12 deficiency: IM injection last done 4/19  LOS (Days) 7 A FACE TO FACE EVALUATION WAS PERFORMED  Burke Terry E 01/16/2012, 9:24 AM

## 2012-01-16 NOTE — Progress Notes (Signed)
Occupational Therapy Note  Patient Details  Name: Shane Juarez MRN: [id number] Date of Birth: 06-Nov-1929 Today's Date: 01/16/2012  Time: 1200-1215 Pt denies pain Group therapy  Pt participated in self feeding group with focus on BUE use for setup, opening containers, and self feeding.  Pt using RUE independently as dominant hand.  Pt interacted appropriately during meal.   Rich Brave 01/16/2012, 1:56 PM

## 2012-01-16 NOTE — Progress Notes (Signed)
Physical Therapy Weekly Progress Note and Session Note  Patient Details  Name: Shane Juarez MRN: [id number] Date of Birth: [birth year]  Today's Date: 01/16/2012 Time:  -  8:30-9:30  60 minutes    Patient has met 3 of 5 short term goals.  Pt is currently performing wc mobility and squat transfers with supervision and vc. Gait requires max to mod assist with rw with poor weight shift to left and scissoring of rt. foot.  Patient continues to demonstrate the following deficits: weakness, low endurance, midline disorientation, balance problems, and difficulty walking; therefore, he will continue to benefit from skilled PT intervention to enhance overall performance with activity tolerance, balance, postural control, ability to compensate for deficits and awareness.  Patient progressing toward long term goals..  Continue plan of care.  PT Short Term Goals Week 1:  PT Short Term Goal 1 (Week 1): Pt will self correct LOB to R during dynamic sitting activity without A except cues PT Short Term Goal 1 - Progress (Week 1): Met PT Short Term Goal 2 (Week 1): Pt will stand during functional activity with 1 UE assist with <=mod A for 5+ minutes PT Short Term Goal 2 - Progress (Week 1): Met PT Short Term Goal 3 (Week 1): Pt will gait 50 ft with mod A LRAD PT Short Term Goal 3 - Progress (Week 1): Met PT Short Term Goal 4 (Week 1): Pt will propel w/c mod I on unit, >150 ft PT Short Term Goal 4 - Progress (Week 1): Progressing toward goal PT Short Term Goal 5 (Week 1): Pt will transfer stand pivot sit 2/3 trials with min A PT Short Term Goal 5 - Progress (Week 1): Partly met  Skilled Therapeutic Interventions/Progress Updates- below     Therapy Documentation Precautions:  Precautions Precautions: Fall Precaution Comments: water protocol  Restrictions Weight Bearing Restrictions: No     Pain: Pain Assessment Pain Assessment: No/denies pain Mobility: Bed Mobility Bed Mobility: Not  assessed Supine to Sit: Not tested (comment) Transfers Sit to Stand: 4: Min assist Sit to Stand Details: Verbal cues for technique Sit to Stand Details (indicate cue type and reason): vc to scoot out, foot placement, anterior weigjt shift and left hand placement Stand to Sit: 4: Min guard Stand to Sit Details: vc for left hand placement, weight shift to left and rt. foot placement. Locomotion : Ambulation Ambulation: Yes Ambulation/Gait Assistance: 3: Mod assist Ambulation Distance (Feet): 50 Feet Assistive device: Rolling walker Ambulation/Gait Assistance Details: Tactile cues for weight shifting;Tactile cues for posture;Visual cues for safe use of DME/AE;Verbal cues for technique;Manual facilitation for weight bearing Ambulation/Gait Assistance Details: vc for weight shift to left and tactile cues for hip stability on rt with assistance and vc for walker placement. Gait Gait Pattern: Step-to pattern;Decreased step length - right;Decreased step length - left;Decreased stance time - left;Decreased stride length;Decreased weight shift to left;Scissoring;Ataxic;Lateral hip instability;Trunk rotated posteriorly on right;Trunk flexed;Wide base of support Gait velocity: not tested Stairs / Additional Locomotion Stairs: No Wheelchair Mobility Wheelchair Mobility: Yes Wheelchair Assistance: 5: Supervision Wheelchair Assistance Details: Other (comment) (assistance needed for positioning wc to mat for planning) Wheelchair Propulsion: Both upper extremities Wheelchair Parts Management: Supervision/cueing Distance: 150'  Trunk/Postural Assessment : Cervical Assessment Cervical Assessment: Within Functional Limits Thoracic Assessment Thoracic Assessment: Within Functional Limits Lumbar Assessment Lumbar Assessment: Within Functional Limits Postural Control Postural Control: Deficits on evaluation     Other Treatments: Treatments Therapeutic Activity: performed quadraped hip extension,  transisition from high to half  kneel, walking on knees with hands on therapist shoulders all min assist. Neuromuscular Facilitation: Lower Extremity;Activity to increase lateral weight shifting;Right;Left;Forced use;Upper Extremity Weight Bearing Technique Weight Bearing Technique: Yes RUE Weight Bearing Technique: Quadruped;High kneeling LUE Weight Bearing Technique: Quadruped;High kneeling Response to Weight Bearing Technique: improved weight shifts and rt hip sability with gait after activity.  See FIM for current functional status  Therapy/Group: Individual Therapy  [name] 01/16/2012, 10:07 AM

## 2012-01-16 NOTE — Plan of Care (Signed)
Problem: RH BOWEL ELIMINATION Goal: RH STG MANAGE BOWEL W/MEDICATION W/ASSISTANCE STG Manage Bowel with Medication with Assistance. Modified independence  Outcome: Progressing Senokot-S

## 2012-01-17 LAB — GLUCOSE, CAPILLARY
Glucose-Capillary: 122 mg/dL — ABNORMAL HIGH (ref 70–99)
Glucose-Capillary: 144 mg/dL — ABNORMAL HIGH (ref 70–99)

## 2012-01-17 MED ORDER — BIOTENE DRY MOUTH MT LIQD
15.0000 mL | Freq: Two times a day (BID) | OROMUCOSAL | Status: DC
  Administered 2012-01-17 – 2012-01-28 (×20): 15 mL via OROMUCOSAL

## 2012-01-17 MED ORDER — BIOTENE DRY MOUTH MT LIQD: 15.0000 mL | Freq: Two times a day (BID) | OROMUCOSAL | Status: DC

## 2012-01-17 NOTE — Progress Notes (Signed)
Nutrition Follow-up/Consult  RD consulted for diet education re: blood sugar management. Reviewed basics of balanced plate handout and CHO Counting Handout with pt and wife.   Continues with Dysphagia 3 with Nectar Thickened Liquids. Intake is 50 - 75%. Does not like Ensure Pudding, will discontinue.  Diet Order:  Dysphagia 3 with Nectar Liquids Supplement: Ensure Pudding PO BID  Meds: Scheduled Meds:   . amLODipine  10 mg Oral Daily  . antiseptic oral rinse  15 mL Mouth Rinse BID  . aspirin  325 mg Oral Daily  . enoxaparin  40 mg Subcutaneous q1800  . feeding supplement  1 Container Oral BID BM  . lisinopril  10 mg Oral Daily  . sennosides  10 mL Oral QHS  . DISCONTD: sodium chloride   Intravenous Q24H  . DISCONTD: amLODipine  5 mg Oral Daily  . DISCONTD: antiseptic oral rinse  15 mL Mouth Rinse BID   Continuous Infusions:  PRN Meds:.acetaminophen, alum & mag hydroxide-simeth, bisacodyl, diphenhydrAMINE, guaiFENesin-dextromethorphan, polyethylene glycol, promethazine, promethazine, promethazine, traZODone  Labs:  CMP     Component Value Date/Time   NA 138 01/14/2012 1258   K 4.1 01/14/2012 1258   CL 103 01/14/2012 1258   CO2 22 01/14/2012 1258   GLUCOSE 191* 01/14/2012 1258   BUN 18 01/14/2012 1258   CREATININE 1.04 01/14/2012 1258   CALCIUM 9.0 01/14/2012 1258   PROT 6.4 01/10/2012 0700   ALBUMIN 3.0* 01/10/2012 0700   AST 15 01/10/2012 0700   ALT 13 01/10/2012 0700   ALKPHOS 75 01/10/2012 0700   BILITOT 0.4 01/10/2012 0700   GFRNONAA 66* 01/14/2012 1258   GFRAA 76* 01/14/2012 1258   CBG (last 3)   Basename 01/17/12 0727 01/16/12 2138 01/16/12 1629  GLUCAP 122* 122* 116*     Intake/Output Summary (Last 24 hours) at 01/17/12 0918 Last data filed at 01/17/12 0800  Gross per 24 hour  Intake    720 ml  Output    400 ml  Net    320 ml   Weight Status:  83.5 kg - wt down approximately 2 kg  Estimated needs:  1600 - 1800 kcal, 75 - 90 grams protein  Nutrition Dx:  Swallowing  difficulty r/t recent CVA AEB need for thickened liquids and ST; MBSS results. Ongoing.  Secondary Nutrition Dx: Food- and nutrition-related knowledge deficit r/t limited prior diet education AEB pt and wife report and need for education, based on staff's consult.  Goal:  Pt to consume at least 75% of meals. Improved.  Monitor: PO intake, weights, labs, I/O's  Intervention:  Discontinue Ensure Pudding. RD provided diet education with pt and wife to help better manage blood sugar. Expect good compliance. RD contact info provided. RD to continue to follow.  Adair Laundry Pager #:  [phone]

## 2012-01-17 NOTE — Progress Notes (Signed)
Speech Language Pathology Daily Session Note  Patient Details  Name: Shane Juarez MRN: [id number] Date of Birth: [birth year]  Today's Date: 01/17/2012 Time: 4098-1191 Time Calculation (min): 25 min  Short Term Goals: Week 1: SLP Short Term Goal 1 (Week 1): Patient will consume Dys. textures and Nectar-thick liquids with supervision semantic cues to recall and utilize compensatory srategies  SLP Short Term Goal 2 (Week 1): Paitent will demonstrate hard effortful swallows with minimal assist semantic cues SLP Short Term Goal 3 (Week 1): Patient will consume trials of water followed by a hard effortful cough with moderate assist semantic cues  Skilled Therapeutic Interventions: Session focused on patient self feeding and utilizing safe swallow compensatory strategies; SLP observed patient  With meal and patient demonstrated intermittent extra swallow as needed with cough x1.  SLP recommends change to intermittent supervision after set up and intermittent double swallows.     Daily Session Precautions/Restrictions  Precautions Precautions: Fall Pain Pain Assessment Pain Assessment: No/denies pain Pain Score: 0-No pain  Therapy/Group: Group Therapy  Charlane Ferretti., CCC-SLP [phone]  [name] 01/17/2012, 1:10 PM

## 2012-01-17 NOTE — Progress Notes (Signed)
Physical Therapy Session Note  Patient Details  Name: Shane Juarez MRN: [id number] Date of Birth: 1930-04-15  Today's Date: 01/17/2012 Time: 0800-0900 Time Calculation (min): 60 min  Short Term Goals: See LTG's  Skilled Therapeutic Interventions/Progress Updates:    NMP for postural control, concentrating on fixing falling to . Pt self correcting with fewer cues, often by stepping out with RLE. R step is ataxic, especially when stepping backwards or up a step. Performed repetitive sit to stands with RW and mirror, reaching back to sit with L hand, lateral squatting around mat with BUE on blue physioball, steps allowing pt to lead with either LE- heavy reliance on UE's to maintain midline and climbing steps sideways with 1 rail only. Discussed downgrading gait goal to min A for fall prevention and he states his wife will be able to do this, OT informed.  Therapy Documentation Precautions:  Precautions Precautions: Fall Precaution Comments: water protocol  Restrictions Weight Bearing Restrictions: No Other Position/Activity Restrictions: Severe fall and lean to the right side with decreased awareness to fix it. Pain: Pain Assessment Pain Assessment: No/denies pain Other Treatments: Treatments Neuromuscular Facilitation: Right;Upper Extremity;Lower Extremity;Activity to increase motor control;Activity to increase grading;Activity to increase lateral weight shifting;Activity to increase anterior-posterior weight shifting  See FIM for current functional status  Therapy/Group: Individual Therapy  Michaelene Song 01/17/2012, 10:41 AM

## 2012-01-17 NOTE — Progress Notes (Signed)
Occupational Therapy Session Note  Patient Details  Name: Shane Juarez MRN: [id number] Date of Birth: [birth year]  Today's Date: 01/17/2012 Time: 1003-1100 Time Calculation (min): 57 min  Short Term Goals: Week 2:  OT Short Term Goal 1 (Week 2): Pt will perfrom toilet transfers stand pivot with min assist 3 consecutive trials. OT Short Term Goal 2 (Week 2): Pt will perfrom LB bathing with supervision sit to stand 3/3 sessions. OT Short Term Goal 3 (Week 2): Pt will perfrom LB dressing sit to stand with supervision for 3 consecutive sessions. OT Short Term Goal 4 (Week 2): Pt will perfrom clothing management and toilet hygiene sit to stand with min assist on 3 consecutive attempts.  Skilled Therapeutic Interventions/Progress Updates:    Pt decided to shower today using the shower seat.  Required max assist to transfer with the RW to the walk-in shower seat.  Frequent LOB to the right when standing in the shower.  Performed dressing at the sink with min assist for balance.  Had pt work on shaving and brushing his teeth at the sink with min guard assist for balance. Last few minutes of session worked on dynamic standing balance by having pt step while holding the left hand against a target on the left side to promote lateral shift to the left.   Therapy Documentation Precautions:  Precautions Precautions: Fall Precaution Comments: water protocol  Restrictions Weight Bearing Restrictions: No Other Position/Activity Restrictions: Severe fall and lean to the right side with decreased awareness to fix it.  Pain: Pain Assessment Pain Assessment: No/denies pain  See FIM for current functional status  Therapy/Group: Individual Therapy  Liara Holm 01/17/2012, 12:38 PM

## 2012-01-17 NOTE — Progress Notes (Signed)
Patient ID: BERNHARDT RIEMENSCHNEIDER, male   DOB: [birth year], 78 y.o.   MRN: [id number] Subjective/Complaints: Tired from PT/OT              Review of Systems  Constitutional: Positive for malaise/fatigue.  HENT: Negative.   Gastrointestinal: Negative for abdominal pain and constipation.  Genitourinary: Negative for dysuria and urgency.  Musculoskeletal: Negative for myalgias.  Skin: Negative.   Neurological: Positive for dizziness.  Psychiatric/Behavioral: Negative for depression.               Objective: Vital Signs: Blood pressure 148/80, pulse 77, temperature 98 F (36.7 C), temperature source Oral, resp. rate 20, height 5\' 9"  (1.753 m), weight 83.5 kg (184 lb 1.4 oz), SpO2 96.00%. No results found. Results for orders placed during the hospital encounter of 01/09/12 (from the past 72 hour(s))  GLUCOSE, CAPILLARY     Status: Abnormal   Collection Time   01/14/12  4:09 PM      Component Value Range Comment   Glucose-Capillary 122 (*) 70 - 99 (mg/dL)    Comment 1 Notify RN     GLUCOSE, CAPILLARY     Status: Abnormal   Collection Time   01/14/12  8:59 PM      Component Value Range Comment   Glucose-Capillary 154 (*) 70 - 99 (mg/dL)    Comment 1 Notify RN     GLUCOSE, CAPILLARY     Status: Abnormal   Collection Time   01/15/12  7:10 AM      Component Value Range Comment   Glucose-Capillary 154 (*) 70 - 99 (mg/dL)    Comment 1 Notify RN     GLUCOSE, CAPILLARY     Status: Abnormal   Collection Time   01/15/12 11:25 AM      Component Value Range Comment   Glucose-Capillary 144 (*) 70 - 99 (mg/dL)    Comment 1 Notify RN     GLUCOSE, CAPILLARY     Status: Abnormal   Collection Time   01/15/12  4:11 PM      Component Value Range Comment   Glucose-Capillary 111 (*) 70 - 99 (mg/dL)    Comment 1 Notify RN     GLUCOSE, CAPILLARY     Status: Abnormal   Collection Time   01/15/12  9:07 PM      Component Value Range Comment   Glucose-Capillary 192 (*) 70 - 99 (mg/dL)    Comment 1 Notify RN       GLUCOSE, CAPILLARY     Status: Abnormal   Collection Time   01/16/12  7:17 AM      Component Value Range Comment   Glucose-Capillary 134 (*) 70 - 99 (mg/dL)    Comment 1 Notify RN     GLUCOSE, CAPILLARY     Status: Abnormal   Collection Time   01/16/12 11:14 AM      Component Value Range Comment   Glucose-Capillary 159 (*) 70 - 99 (mg/dL)    Comment 1 Notify RN     GLUCOSE, CAPILLARY     Status: Abnormal   Collection Time   01/16/12  4:29 PM      Component Value Range Comment   Glucose-Capillary 116 (*) 70 - 99 (mg/dL)    Comment 1 Notify RN     GLUCOSE, CAPILLARY     Status: Abnormal   Collection Time   01/16/12  9:38 PM      Component Value Range Comment   Glucose-Capillary 122 (*)  70 - 99 (mg/dL)    Comment 1 Notify RN     GLUCOSE, CAPILLARY     Status: Abnormal   Collection Time   01/17/12  7:27 AM      Component Value Range Comment   Glucose-Capillary 122 (*) 70 - 99 (mg/dL)    Comment 1 Notify RN     GLUCOSE, CAPILLARY     Status: Abnormal   Collection Time   01/17/12 11:18 AM      Component Value Range Comment   Glucose-Capillary 144 (*) 70 - 99 (mg/dL)    Comment 1 Notify RN       Physical Exam  Constitutional: He is oriented to person, place, and time. He appears well-developed and well-nourished.  HENT:  Head: Normocephalic and atraumatic.  Right Ear: External ear normal.  Left Ear: External ear normal.  Nose: Nose normal.  Mouth/Throat: Oropharynx is clear and moist.  Eyes: Conjunctivae and EOM are normal. Pupils are equal, round, and reactive to light.  Neck: Normal range of motion. Neck supple.  Cardiovascular: Normal rate and regular rhythm. Exam reveals no gallop and no friction rub.  No murmur heard.  Pulmonary/Chest: Effort normal and breath sounds normal. No respiratory distress.  Abdominal: Soft. Bowel sounds are normal. He exhibits no distension.  Neurological: He is alert and oriented to person, place, and time. A cranial nerve deficit (nystagmus  to right lateral field) is present. Coordination abnormal.  Follows basic 1 and 2 step commands without difficulty. Speech soft and raspy (not baseline). Slight dysarthria. Right upper and lower limb ataxia mild/ moderate on finger to nose andmild heel shin testing. RUE with pronator drift. Strength fairly well preserved. Sensation grossly intact in all 4's and in face Voice is hoarse no dysarthria Skin: Skin is warm and dry.  Psychiatric: He has a normal mood and affect. His behavior is normal. Judgment and thought content normal  Assessment/Plan: 1. 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 Physiatrist is providing close team supervision and 24 hour management of active medical problems listed below. Physiatrist and rehab team continue to assess barriers to discharge/monitor patient progress toward functional and medical goals. FIM: FIM - Bathing Bathing Steps Patient Completed: Chest;Right Arm;Left Arm;Abdomen;Front perineal area;Buttocks;Right upper leg;Left upper leg;Right lower leg (including foot);Left lower leg (including foot) Bathing: 4: Steadying assist  FIM - Upper Body Dressing/Undressing Upper body dressing/undressing steps patient completed: Thread/unthread right sleeve of pullover shirt/dresss;Thread/unthread left sleeve of pullover shirt/dress;Put head through opening of pull over shirt/dress;Pull shirt over trunk Upper body dressing/undressing: 5: Set-up assist to: Obtain clothing/put away FIM - Lower Body Dressing/Undressing Lower body dressing/undressing steps patient completed: Thread/unthread left underwear leg;Pull underwear up/down;Thread/unthread right pants leg;Thread/unthread left pants leg;Pull pants up/down;Don/Doff right sock;Don/Doff left sock;Don/Doff right shoe;Don/Doff left shoe;Thread/unthread right underwear  leg;Fasten/unfasten right shoe;Fasten/unfasten left shoe Lower body dressing/undressing: 4: Min-Patient completed 75 plus % of tasks  FIM - Toileting Toileting steps completed by patient: Adjust clothing after toileting;Performs perineal hygiene;Adjust clothing prior to toileting Toileting Assistive Devices: Grab bar or rail for support Toileting: 7: Independent: No helper, no device (seated with urinal)  FIM - Diplomatic Services operational officer Devices: Grab bars Toilet Transfers: 4-To toilet/BSC: Min A (steadying Pt. > 75%);4-From toilet/BSC: Min A (steadying Pt. > 75%)  FIM - Bed/Chair Transfer Bed/Chair Transfer Assistive Devices:  (squat pivot) Bed/Chair Transfer: 5: Bed > Chair or W/C: Supervision (  verbal cues/safety issues);5: Chair or W/C > Bed: Supervision (verbal cues/safety issues)  FIM - Locomotion: Wheelchair Distance: 150' Locomotion: Wheelchair: 5: Travels 150 ft or more: maneuvers on rugs and over door sills with supervision, cueing or coaxing FIM - Locomotion: Ambulation Locomotion: Ambulation Assistive Devices: Designer, industrial/product Ambulation/Gait Assistance: 3: Mod assist Locomotion: Ambulation: 2: Travels 50 - 149 ft with moderate assistance (Pt: 50 - 74%)  Comprehension Comprehension Mode: Auditory Comprehension: 5-Follows basic conversation/direction: With extra time/assistive device  Expression Expression Mode: Verbal Expression: 5-Expresses basic needs/ideas: With no assist  Social Interaction Social Interaction: 6-Interacts appropriately with others with medication or extra time (anti-anxiety, antidepressant).  Problem Solving Problem Solving: 5-Solves basic 90% of the time/requires cueing < 10% of the time  Memory Memory: 4-Recognizes or recalls 75 - 89% of the time/requires cueing 10 - 24% of the time    Medical Problem List and Plan:  1. DVT Prophylaxis/Anticoagulation: Lovenox   2. Pain Management: N/A   3. Mood: Motivated. LSCW to  follow up for formal evaluation. Mood fairly upbeat at present.   4. HTN: labile.  Started on lisinopril and norvascon 04/18.still with systolic elevation.    Monitor with bid checks.lytes ok but intake poor will increase Norvasc to 10mg  5. Dysphagia: Continue D3 diet with nectar liquids and full supervision to prevent aspiration. D/C IVF monitor lytes Monitor intake and check lytes for hydration status.    6. Impaired Fasting Glucose:  Hgb AIC@6 .3. CBGs ok,. Will D/C CBGs  7. B 12 deficiency: IM injection last done 4/19  LOS (Days) 8 A FACE TO FACE EVALUATION WAS PERFORMED  Claudette Laws E 01/17/2012, 2:20 PM

## 2012-01-17 NOTE — Progress Notes (Signed)
Per State Regulation 482.30 This chart was reviewed for medical necessity with respect to the patient's Admission/Duration of stay. Pt participating and making steady gains. Diet upgraded to D-3. IV fluids d/c'd. Pushing fluids/monitoring hydration.  [name]                 Nurse Care Manager            Next Review Date:01/21/12

## 2012-01-17 NOTE — Progress Notes (Signed)
Speech Language Pathology Daily Session Note & Weekly Progress Note  Patient Details  Name: Shane Juarez MRN: [id number] Date of Birth: 05-01-1930  Today's Date: 01/17/2012 Time: 1330-1400 Time Calculation (min): 30 min  Short Term Goals: Week 1: SLP Short Term Goal 1 (Week 1): Patient will consume Dys. textures and Nectar-thick liquids with supervision semantic cues to recall and utilize compensatory srategies  SLP Short Term Goal 2 (Week 1): Paitent will demonstrate hard effortful swallows with minimal assist semantic cues SLP Short Term Goal 3 (Week 1): Patient will consume trials of water followed by a hard effortful cough with moderate assist semantic cues  Skilled Therapeutic Interventions: Session focused on trials of upgraded textures with thin liquids via cup with 1 throat clear with 12oz consumed and throat clears with regular texture crackers due to the crumbs reported by patient.  Given previous MBSS results of inconsistently sensed aspiration it is recommended that he consume Dys.3 textures and thin liquids via cup with 2 swallow per bite/sip and an intermittent hard cough to reduce possible penetrates/aspirates with continued full supervision to ensure carryover of strategies.  Patient and wife educated on SLP's rationale and to notify RN if coughing occurs.   Daily Session Precautions/Restrictions    FIM:  Comprehension Comprehension Mode: Auditory Comprehension: 6-Follows complex conversation/direction: With extra time/assistive device Expression Expression Mode: Verbal Expression: 5-Expresses basic needs/ideas: With no assist Social Interaction Social Interaction: 6-Interacts appropriately with others with medication or extra time (anti-anxiety, antidepressant). Problem Solving Problem Solving: 5-Solves basic 90% of the time/requires cueing < 10% of the time Memory Memory: 4-Recognizes or recalls 75 - 89% of the time/requires cueing 10 - 24% of the time FIM -  Eating Eating Activity: 5: Supervision/cues General    Pain Pain Assessment Pain Assessment: No/denies pain Pain Score: 0-No pain  Therapy/Group: Individual Therapy  Speech Language Pathology Weekly Progress Note  Patient Details  Name: Shane Juarez MRN: [id number] Date of Birth: 02/16/1930  Today's Date: 01/17/2012  Short Term Goals: Week 1: SLP Short Term Goal 1 (Week 1): Patient will consume Dys. textures and Nectar-thick liquids with supervision semantic cues to recall and utilize compensatory srategies  SLP Short Term Goal 1 - Progress (Week 1): Met SLP Short Term Goal 2 (Week 1): Paitent will demonstrate hard effortful swallows with minimal assist semantic cues SLP Short Term Goal 2 - Progress (Week 1): Met SLP Short Term Goal 3 (Week 1): Patient will consume trials of water followed by a hard effortful cough with moderate assist semantic cues SLP Short Term Goal 3 - Progress (Week 1): Met Week 2: SLP Short Term Goal 1 (Week 2): Patient will consume Dys. 3 and thin liquids without observed s/s of aspiraiton and supervision semantic cues to utilize strategies SLP Short Term Goal 2 (Week 2): Patient will consume trials of regualr textures without observed s/s of aspiration  Weekly Progress Updates: Patient met 3 out of 3 short term goals this reporting period with gains in swallow function as determined by consuming Dys.3 textures and nectar-thick liquids without observed s/s of aspiration and carryover use of compensatory strategies (extra dry swallow).  Of note, patient also with increased vocal intensity and decreased hoarseness indicating improved vocal cord function.  Patient was started with thin liquids today and as a result would continue to benefit from skilled SLP services to address toleration and trials of texture upgrades.  With goals for least restrictive p.o. Intake upon discharge to reduce burden of care.   SLP Frequency:  1-2 X/day, 30-60 minutes;3 out of 7 days SLP  Treatment/Interventions: Cognitive remediation/compensation;Cueing hierarchy;Dysphagia/aspiration precaution training;Environmental controls;Functional tasks;Internal/external aids;Patient/family education;Therapeutic Activities;Therapeutic Exercise;Other (comment) (group treatment)  Daily Session Cognition: Overall Cognitive Status: Impaired at baseline Oral/Motor: Oral Motor/Sensory Function Overall Oral Motor/Sensory Function: Appears within functional limits for tasks assessed Motor Speech Overall Motor Speech: Appears within functional limits for tasks assessed Respiration: Within functional limits Phonation: Hoarse Resonance: Within functional limits Articulation: Within functional limitis Intelligibility: Intelligible Motor Planning: Witnin functional limits Comprehension: Auditory Comprehension Overall Auditory Comprehension: Appears within functional limits for tasks assessed Visual Recognition/Discrimination Discrimination: Within Function Limits Reading Comprehension Reading Status: Within funtional limits (with basic) Expression: Expression Primary Mode of Expression: Verbal Verbal Expression Overall Verbal Expression: Appears within functional limits for tasks assessed Written Expression Dominant Hand: Right Written Expression:  (wife reports slighty decreased legibility)  Fae Pippin, M.A., CCC-SLP [phone]  [name] 01/17/2012, 4:57 PM

## 2012-01-17 NOTE — Progress Notes (Signed)
Occupational Therapy Note  Patient Details  Name: Shane Juarez MRN: [id number] Date of Birth: 05/04/1930 Today's Date: 01/17/2012  1130-1145 - 15 Minutes Group Therapy ([facility] [employer]) Co-Treatment with SLP No complaints of pain  Skilled occupational therapy focusing on functional use of bilateral UEs, attention during task, awareness during task, and taking small bites of food. Patients wife present during therapy and gave verbal cues appropriately and prn. Patient appropriate during group.  [name] 01/17/2012, 1:29 PM

## 2012-01-18 LAB — BASIC METABOLIC PANEL
BUN: 10 mg/dL (ref 6–23)
CO2: 25 mEq/L (ref 19–32)
Calcium: 8.8 mg/dL (ref 8.4–10.5)
Chloride: 103 mEq/L (ref 96–112)
Creatinine, Ser: 1 mg/dL (ref 0.50–1.35)
GFR calc Af Amer: 80 mL/min — ABNORMAL LOW (ref >90)
GFR calc non Af Amer: 69 mL/min — ABNORMAL LOW (ref >90)
Glucose, Bld: 133 mg/dL — ABNORMAL HIGH (ref 70–99)
Potassium: 3.8 mEq/L (ref 3.5–5.1)
Sodium: 138 mEq/L (ref 135–145)

## 2012-01-18 NOTE — Plan of Care (Signed)
Problem: RH KNOWLEDGE DEFICIT Goal: RH STG INCREASE KNOWLEDGE OF DIABETES Patient/Caregiver will verbalize signs and symptoms of hypo vs hyperglycemia. supervision  Outcome: Progressing Per Dr.Kirsteins will be diet control at d/c, no CBG checks or meds, discussed S/S of hypo/hyper glycemia

## 2012-01-18 NOTE — Plan of Care (Signed)
Problem: RH KNOWLEDGE DEFICIT Goal: RH STG INCREASE KNOWLEDGE OF DYSPHAGIA/FLUID INTAKE Patient/Caregiver will verbalize the conscience of foods included in the dysphagia diet and nectar thick liquids.supervision Outcome: Progressing Pt on thin liquids now

## 2012-01-18 NOTE — Progress Notes (Signed)
Speech Language Pathology Daily Session Note  Patient Details  Name: Shane Juarez MRN: [id number] Date of Birth: 11-Aug-1930  Today's Date: 01/18/2012 Time: 1140-1210 Time Calculation (min): 30 min  Short Term Goals: Week 2: SLP Short Term Goal 1 (Week 2): Patient will consume Dys. 3 and thin liquids without observed s/s of aspiraiton and supervision semantic cues to utilize strategies SLP Short Term Goal 2 (Week 2): Patient will consume trials of regualr textures without observed s/s of aspiration  Skilled Therapeutic Interventions: Group treatment focused on patient safely consuming Dys.3 textures and thin liquids via cup with 3 coughs throughout meal.  SLP suspects them to be related to portion control and possible reside of soft solid textures.   SLP and wife facilitated session with minimal assist semantic cues to follow safe swallow strategies of small bites/sips,  double swallows and intermittent cough.  Daily Session Pain Pain Assessment Pain Assessment: No/denies pain Pain Score: 0-No pain  Therapy/Group: Group Therapy  Charlane Ferretti., CCC-SLP [phone]  Shane Juarez 01/18/2012, 3:36 PM

## 2012-01-18 NOTE — Progress Notes (Signed)
Social Work Patient ID: Shane Juarez, male   DOB: 1930/01/29, 78 y.o.   MRN: [id number] Met with pt and wife regarding team conference and discharge planning.  Wife has been here and observes daily in Therapies, she can see the progress he has made.  Pt reports: " It is not fast enough, but I will get there."  Aware of goals and discharge Date-5/8.  Pleased with everything so far. Continue to work on discharge plans.

## 2012-01-18 NOTE — Progress Notes (Signed)
Occupational Therapy Session Notes  Patient Details  Name: Shane Juarez MRN: [id number] Date of Birth: Dec 29, 1929  Today's Date: 01/18/2012  Short Term Goals: Week 2:  OT Short Term Goal 1 (Week 2): Pt will perfrom toilet transfers stand pivot with min assist 3 consecutive trials. OT Short Term Goal 2 (Week 2): Pt will perfrom LB bathing with supervision sit to stand 3/3 sessions. OT Short Term Goal 3 (Week 2): Pt will perfrom LB dressing sit to stand with supervision for 3 consecutive sessions. OT Short Term Goal 4 (Week 2): Pt will perfrom clothing management and toilet hygiene sit to stand with min assist on 3 consecutive attempts.  Precautions:  Precautions Precautions: Fall Precaution Comments: thin liquids with need to follow swallowing strategies Weight Bearing Restrictions: No Other Position/Activity Restrictions: Severe fall and lean to the right side with decreased awareness to fix it.  1st Session: Time: 0903-1000 Time Calculation (min): 57 min  Skilled Therapeutic Interventions/Progress Updates:  Self care retraining to include sponge bath at sink (patient declined shower stating "I took a shower yesterday"), dress, groom, functional mobility with RW, and education with patient's son the last 15 minutes of session.  Focus session on sit<>stands using LUE to assist in an effort to shift weight to his left which results in a safer transitional movement, dynamic standing balance, increased use of RUE to include shaving with electric razor and other fine motor tasks during self care session. During one LOB, patient leaned on the wall then proceeded to continue with task in unsafe position requiring assistance to reposition.  Son observed patient patient struggle with balance while ambulating during functional tasks.  Pain: No c/o pain  See FIM for current functional status  Therapy/Group: Individual Therapy and education with patient's  son.  ---------------------------------------------------------------------------------------- Second session:  [employer] Time: 1130-1140 Time Calculation (min): 10 min (60 minute group)  Pain Assessment: No c/o pain  Skilled Therapeutic Interventions: Pt participated in self feeding group with focus on swallowing strategies, BUE use for setup, opening containers, and self feeding. Focus session on increased use of dominant RUE.  Pt interacted with other patient's and staff during meal. Wife present during group session.   See FIM for current functional status  Therapy/Group: Group Therapy  [name] 01/18/2012, 11:26 AM

## 2012-01-18 NOTE — Progress Notes (Signed)
Speech Language Pathology Daily Session Note  Patient Details  Name: Shane Juarez MRN: [id number] Date of Birth: Sep 08, 1930  Today's Date: 01/18/2012 Time: 5621-3086 Time Calculation (min): 25 min  Short Term Goals: Week 2: SLP Short Term Goal 1 (Week 2): Patient will consume Dys. 3 and thin liquids without observed s/s of aspiraiton and supervision semantic cues to utilize strategies SLP Short Term Goal 2 (Week 2): Patient will consume trials of regualr textures without observed s/s of aspiration  Skilled Therapeutic Interventions: Session focused on trials of thin liquids via cup, 12 oz with no overt s/s of aspiration and RN assessed lung sounds.  RN cued for cough which cleared lung sounds per her report.  SLP changed recommendations to include intermittent cough following thin liquid sips and facilitated carryover by providing patient with a written sign as a reminder.   Daily Session Precautions/Restrictions    FIM:  Comprehension Comprehension Mode: Auditory Comprehension: 6-Follows complex conversation/direction: With extra time/assistive device Expression Expression Mode: Verbal Expression: 6-Expresses complex ideas: With extra time/assistive device Social Interaction Social Interaction: 6-Interacts appropriately with others with medication or extra time (anti-anxiety, antidepressant). Problem Solving Problem Solving: 5-Solves complex 90% of the time/cues < 10% of the time Memory Memory: 4-Recognizes or recalls 75 - 89% of the time/requires cueing 10 - 24% of the time FIM - Eating Eating Activity: 5: Supervision/cues General    Pain Pain Assessment Pain Assessment: No/denies pain Pain Score: 0-No pain  Therapy/Group: Individual Therapy  Charlane Ferretti., CCC-SLP [phone]  Shane Juarez 01/18/2012, 3:30 PM

## 2012-01-18 NOTE — Progress Notes (Signed)
Physical Therapy Weekly Progress Note  Patient Details  Name: Shane Juarez MRN: [id number] Date of Birth: 05/23/1930  Today's Date: 01/18/2012 Time: 4098-1191 Time Calculation (min): 58 min  Patient has met 5 of 5 short term goals.    Patient continues to demonstrate the following deficits: Rt. Lateral lean, instability with gait requiring up to moderate assist and moderate verbal cues, impaired dynamic and static balance, and therefore will continue to benefit from skilled PT intervention to enhance overall performance with activity tolerance, balance, postural control, ability to compensate for deficits and awareness.  Patient progressing toward long term goals..  Continue plan of care.  PT Short Term Goals Week 1:  PT Short Term Goal 1 (Week 1): Pt will self correct LOB to R during dynamic sitting activity without A except cues PT Short Term Goal 1 - Progress (Week 1): Met PT Short Term Goal 2 (Week 1): Pt will stand during functional activity with 1 UE assist with <=mod A for 5+ minutes PT Short Term Goal 2 - Progress (Week 1): Met PT Short Term Goal 3 (Week 1): Pt will gait 50 ft with mod A LRAD PT Short Term Goal 3 - Progress (Week 1): Met PT Short Term Goal 4 (Week 1): Pt will propel w/c mod I on unit, >150 ft PT Short Term Goal 4 - Progress (Week 1): Met PT Short Term Goal 5 (Week 1): Pt will transfer stand pivot sit 2/3 trials with min A PT Short Term Goal 5 - Progress (Week 1): Met  Skilled Therapeutic Interventions/Progress Updates:    Treatment focused on improving Lt. Weight shift in stance and with gait in addition to safety with mobility. Practiced modulated Rt. LE foot taps on target, bil. UE support, min assist with encouragement to decrease UE support. Standing balance activities with forwards/Lt. Reaching outside of base of support, up to min assist for loss of balance. Sit<>stand x 10 reps for strengthening and repetition for safely placing Lt. UE with transitions, min  assist progressing to supervision. Gait training x 60', 80' with RW and up to moderate assist; utilized walking a thick line then later mirror feedback to decrease Rt. LE adduction, improve Lt. Weight shift, and promote controlled advancement of RW in straight path. Pt responded well to visual feedback from mirrors. Max verbal cues and assist needed to keep RW at a safe distance. Stand pivot transfers x 3 with min assist.    Therapy Documentation Precautions:  Precautions Precautions: Fall Precaution Comments: water protocol  Restrictions Weight Bearing Restrictions: No Other Position/Activity Restrictions: Severe fall and lean to the right side with decreased awareness to fix it.    Pain: Pain Assessment Pain Assessment: No/denies pain Pain Score: 0-No pain    Locomotion : Ambulation Ambulation/Gait Assistance: 3: Mod assist Wheelchair Mobility Distance: 150'   See FIM for current functional status  Therapy/Group: Individual Therapy  Wilhemina Bonito 01/18/2012, 5:07 PM

## 2012-01-18 NOTE — Progress Notes (Signed)
Patient ID: Shane Juarez, male   DOB: [birth year], 78 y.o.   MRN: [id number] Subjective/Complaints: Drinking thin liquid but intake is low              Review of Systems  Constitutional: Positive for malaise/fatigue.  HENT: Negative.   Gastrointestinal: Negative for abdominal pain and constipation.  Genitourinary: Negative for dysuria and urgency.  Musculoskeletal: Negative for myalgias.  Skin: Negative.   Neurological: Positive for dizziness.  Psychiatric/Behavioral: Negative for depression.               Objective: Vital Signs: Blood pressure 166/83, pulse 85, temperature 98.7 F (37.1 C), temperature source Oral, resp. rate 18, height 5\' 9"  (1.753 m), weight 83.5 kg (184 lb 1.4 oz), SpO2 97.00%. No results found. Results for orders placed during the hospital encounter of 01/09/12 (from the past 72 hour(s))  GLUCOSE, CAPILLARY     Status: Abnormal   Collection Time   01/15/12 11:25 AM      Component Value Range Comment   Glucose-Capillary 144 (*) 70 - 99 (mg/dL)    Comment 1 Notify RN     GLUCOSE, CAPILLARY     Status: Abnormal   Collection Time   01/15/12  4:11 PM      Component Value Range Comment   Glucose-Capillary 111 (*) 70 - 99 (mg/dL)    Comment 1 Notify RN     GLUCOSE, CAPILLARY     Status: Abnormal   Collection Time   01/15/12  9:07 PM      Component Value Range Comment   Glucose-Capillary 192 (*) 70 - 99 (mg/dL)    Comment 1 Notify RN     GLUCOSE, CAPILLARY     Status: Abnormal   Collection Time   01/16/12  7:17 AM      Component Value Range Comment   Glucose-Capillary 134 (*) 70 - 99 (mg/dL)    Comment 1 Notify RN     GLUCOSE, CAPILLARY     Status: Abnormal   Collection Time   01/16/12 11:14 AM      Component Value Range Comment   Glucose-Capillary 159 (*) 70 - 99 (mg/dL)    Comment 1 Notify RN     GLUCOSE, CAPILLARY     Status: Abnormal   Collection Time   01/16/12  4:29 PM      Component Value Range Comment   Glucose-Capillary 116 (*) 70 - 99 (mg/dL)    Comment 1 Notify RN     GLUCOSE, CAPILLARY     Status: Abnormal   Collection Time   01/16/12  9:38 PM      Component Value Range Comment   Glucose-Capillary 122 (*) 70 - 99 (mg/dL)    Comment 1 Notify RN     GLUCOSE, CAPILLARY     Status: Abnormal   Collection Time   01/17/12  7:27 AM      Component Value Range Comment   Glucose-Capillary 122 (*) 70 - 99 (mg/dL)    Comment 1 Notify RN     GLUCOSE, CAPILLARY     Status: Abnormal   Collection Time   01/17/12 11:18 AM      Component Value Range Comment   Glucose-Capillary 144 (*) 70 - 99 (mg/dL)    Comment 1 Notify RN     BASIC METABOLIC PANEL     Status: Abnormal   Collection Time   01/18/12  6:32 AM      Component Value Range Comment   Sodium  138  135 - 145 (mEq/L)    Potassium 3.8  3.5 - 5.1 (mEq/L)    Chloride 103  96 - 112 (mEq/L)    CO2 25  19 - 32 (mEq/L)    Glucose, Bld 133 (*) 70 - 99 (mg/dL)    BUN 10  6 - 23 (mg/dL)    Creatinine, Ser 1.61  0.50 - 1.35 (mg/dL)    Calcium 8.8  8.4 - 10.5 (mg/dL)    GFR calc non Af Amer 69 (*) >90 (mL/min)    GFR calc Af Amer 80 (*) >90 (mL/min)     Physical Exam  Constitutional: He is oriented to person, place, and time. He appears well-developed and well-nourished.  HENT:  Head: Normocephalic and atraumatic.  Right Ear: External ear normal.  Left Ear: External ear normal.  Nose: Nose normal.  Mouth/Throat: Oropharynx is clear and moist.  Eyes: Conjunctivae and EOM are normal. Pupils are equal, round, and reactive to light.  Neck: Normal range of motion. Neck supple.  Cardiovascular: Normal rate and regular rhythm. Exam reveals no gallop and no friction rub.  No murmur heard.  Pulmonary/Chest: Effort normal and breath sounds normal. No respiratory distress.  Abdominal: Soft. Bowel sounds are normal. He exhibits no distension.  Neurological: He is alert and oriented to person, place, and time. A cranial nerve deficit (nystagmus to right lateral field) is present. Coordination  abnormal.  Follows basic 1 and 2 step commands without difficulty. Speech soft and raspy (not baseline). Slight dysarthria. Right upper and lower limb ataxia mild/ moderate on finger to nose andmild heel shin testing. RUE with pronator drift. Strength fairly well preserved. Sensation grossly intact in all 4's and in face Voice is hoarse no dysarthria Skin: Skin is warm and dry.  Psychiatric: He has a normal mood and affect. His behavior is normal. Judgment and thought content normal  Assessment/Plan: 1. 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 Physiatrist is providing close team supervision and 24 hour management of active medical problems listed below. Physiatrist and rehab team continue to assess barriers to discharge/monitor patient progress toward functional and medical goals. FIM: FIM - Bathing Bathing Steps Patient Completed: Chest;Right Arm;Left Arm;Abdomen;Front perineal area;Buttocks;Right upper leg;Left upper leg;Right lower leg (including foot);Left lower leg (including foot) Bathing: 4: Steadying assist  FIM - Upper Body Dressing/Undressing Upper body dressing/undressing steps patient completed: Thread/unthread right sleeve of pullover shirt/dresss;Thread/unthread left sleeve of pullover shirt/dress;Put head through opening of pull over shirt/dress;Pull shirt over trunk Upper body dressing/undressing: 5: Set-up assist to: Obtain clothing/put away FIM - Lower Body Dressing/Undressing Lower body dressing/undressing steps patient completed: Thread/unthread left underwear leg;Pull underwear up/down;Thread/unthread right pants leg;Thread/unthread left pants leg;Pull pants up/down;Don/Doff right sock;Don/Doff left sock;Don/Doff right shoe;Don/Doff left shoe;Thread/unthread right underwear leg;Fasten/unfasten right shoe;Fasten/unfasten left shoe Lower  body dressing/undressing: 4: Min-Patient completed 75 plus % of tasks  FIM - Toileting Toileting steps completed by patient: Adjust clothing after toileting;Performs perineal hygiene;Adjust clothing prior to toileting Toileting Assistive Devices: Grab bar or rail for support Toileting: 7: Independent: No helper, no device (seated with urinal)  FIM - Diplomatic Services operational officer Devices: Grab bars Toilet Transfers: 4-To toilet/BSC: Min A (steadying Pt. > 75%);4-From toilet/BSC: Min A (steadying Pt. > 75%)  FIM - Bed/Chair Transfer Bed/Chair Transfer Assistive Devices:  (squat pivot) Bed/Chair Transfer: 5: Bed > Chair or W/C: Supervision (verbal cues/safety issues);5: Chair or  W/C > Bed: Supervision (verbal cues/safety issues)  FIM - Locomotion: Wheelchair Distance: 150' Locomotion: Wheelchair: 5: Travels 150 ft or more: maneuvers on rugs and over door sills with supervision, cueing or coaxing FIM - Locomotion: Ambulation Locomotion: Ambulation Assistive Devices: Designer, industrial/product Ambulation/Gait Assistance: 3: Mod assist Locomotion: Ambulation: 2: Travels 50 - 149 ft with moderate assistance (Pt: 50 - 74%)  Comprehension Comprehension Mode: Auditory Comprehension: 6-Follows complex conversation/direction: With extra time/assistive device  Expression Expression Mode: Verbal Expression: 6-Expresses complex ideas: With extra time/assistive device  Social Interaction Social Interaction: 6-Interacts appropriately with others with medication or extra time (anti-anxiety, antidepressant).  Problem Solving Problem Solving: 5-Solves complex 90% of the time/cues < 10% of the time  Memory Memory: 4-Recognizes or recalls 75 - 89% of the time/requires cueing 10 - 24% of the time    Medical Problem List and Plan:  1. DVT Prophylaxis/Anticoagulation: Lovenox   2. Pain Management: N/A   3. Mood: Motivated. LSCW to follow up for formal evaluation. Mood fairly upbeat at  present.   4. HTN: labile.  Started on lisinopril and norvascon 04/18.still with systolic elevation.    Monitor with bid checks.lytes ok but intake poor will increase Norvasc to 10mg  5. Dysphagia: Continue D3 diet with nectar liquids and full supervision to prevent aspiration. D/C IVF 2 days ago Monitor intake and check lytes for hydration status.    6. Impaired Fasting Glucose:  Hgb AIC@6 .3. CBGs ok,. Will D/C CBGs  7. B 12 deficiency: IM injection last done 4/19  LOS (Days) 9 A FACE TO FACE EVALUATION WAS PERFORMED  Hadiya Spoerl E 01/18/2012, 8:14 AM

## 2012-01-19 DIAGNOSIS — Z5189 Encounter for other specified aftercare: Secondary | ICD-10-CM

## 2012-01-19 DIAGNOSIS — I6330 Cerebral infarction due to thrombosis of unspecified cerebral artery: Secondary | ICD-10-CM

## 2012-01-19 DIAGNOSIS — G8110 Spastic hemiplegia affecting unspecified side: Secondary | ICD-10-CM

## 2012-01-19 LAB — BASIC METABOLIC PANEL
BUN: 13 mg/dL (ref 6–23)
CO2: 25 mEq/L (ref 19–32)
Calcium: 8.9 mg/dL (ref 8.4–10.5)
Chloride: 102 mEq/L (ref 96–112)
Creatinine, Ser: 1.1 mg/dL (ref 0.50–1.35)
GFR calc Af Amer: 71 mL/min — ABNORMAL LOW (ref >90)
GFR calc non Af Amer: 61 mL/min — ABNORMAL LOW (ref >90)
Glucose, Bld: 166 mg/dL — ABNORMAL HIGH (ref 70–99)
Potassium: 3.7 mEq/L (ref 3.5–5.1)
Sodium: 137 mEq/L (ref 135–145)

## 2012-01-19 NOTE — Progress Notes (Signed)
Occupational Therapy Session Note  Patient Details  Name: EDD REPPERT MRN: [id number] Date of Birth: 01/11/1930  Today's Date: 01/19/2012 Time: 0865-7846 Time Calculation (min): 20 min  Skilled Therapeutic Interventions/Progress Updates: Participated in diners club with utilizing selective attention and incorpating bilateral UEs to complete meal.  Patient stated he was not very hungry today but attended to meal and social interaction very appropriately.   Therapy Documentation Precautions:  Precautions Precautions: Fall Precaution Comments: water protocol  Restrictions Weight Bearing Restrictions: No Other Position/Activity Restrictions: Severe fall and lean to the right side with decreased awareness to fix it.  Pain: no c/os   See FIM for current functional status  Therapy/Group: Group Therapy  Rozelle Logan 01/19/2012, 4:49 PM

## 2012-01-19 NOTE — Progress Notes (Signed)
Speech Language Pathology Daily Session Note [employer]  Patient Details  Name: Shane Juarez MRN: [id number] Date of Birth: Jan 18, 1930  Today's Date: 01/19/2012 Time: 1130-1145 Time Calculation (min): 15 min  Short Term Goals:  SLP Short Term Goal 1 (Week 2): Patient will consume Dys. 3 and thin liquids without observed s/s of aspiraiton and supervision semantic cues to utilize strategies SLP Short Term Goal 2 (Week 2): Patient will consume trials of regualr textures without observed s/s of aspiration  Skilled Therapeutic Interventions: Group treatment focused on patient safely consuming Dys.3 textures and thin liquids via cup. Pt received regular textures for lunch (chicken breast sandwich) and demonstrated efficient mastication. Pt demonstrated throat clear X 2 throughout meal, suspect due to large bites. Pt required min assist semantic cues to follow safe swallow strategies of small bites/sips, double swallows and intermittent cough.  Daily Session FIM:  Comprehension Comprehension: 6-Follows complex conversation/direction: With extra time/assistive device Expression Expression Mode: Verbal Expression: 6-Expresses complex ideas: With extra time/assistive device Social Interaction Social Interaction: 6-Interacts appropriately with others with medication or extra time (anti-anxiety, antidepressant). Problem Solving Problem Solving: 5-Solves complex 90% of the time/cues < 10% of the time Memory Memory: 4-Recognizes or recalls 75 - 89% of the time/requires cueing 10 - 24% of the time FIM - Eating Eating Activity: 5: Supervision/cues Pain Pain Assessment Pain Assessment: No/denies pain  Therapy/Group: Dysphagia Group  Brodee Mauritz 01/19/2012, 12:47 PM

## 2012-01-19 NOTE — Progress Notes (Signed)
Physical Therapy Note  Patient Details  Name: Shane Juarez MRN: [id number] Date of Birth: 09/23/1930 Today's Date: 01/19/2012  5366-4403 (30 minutes) individual Pain: no complaint of pain Focus of treatment: Standing activities to facilitate midline stance while stepping Treatment:Gait 15 feet mod assist with moderate lean to right using RW; sit to stand with vcs for bilateral UE pushoff from armrest min assist; static standing with bedside table support min assist using mirror for visual feedback. Pt able to stand statically in midline; alternate stepping with decreased weight shift to left ; stepping up/down 4 inch step on right with visual ans vcs to maintain midline ; Static standing without support using mirror with tactile cues for weight shifts at hips.   Lisett Dirusso,JIM 01/19/2012, 9:27 AM

## 2012-01-19 NOTE — Progress Notes (Signed)
Patient ID: Shane Juarez, male   DOB: 1930-02-02, 78 y.o.   MRN: [id number] Patient ID: Shane Juarez, male   DOB: 05-04-1930, 78 y.o.   MRN: [id number] Subjective/Complaints: no new issues. intake a little better yesterday. Review of Systems  Constitutional: Positive for malaise/fatigue.  HENT: Negative.   Gastrointestinal: Negative for abdominal pain and constipation.  Genitourinary: Negative for dysuria and urgency.  Musculoskeletal: Negative for myalgias.  Skin: Negative.   Neurological: Positive for dizziness.  Psychiatric/Behavioral: Negative for depression.               Objective: Vital Signs: Blood pressure 144/72, pulse 86, temperature 98.7 F (37.1 C), temperature source Oral, resp. rate 20, height 5\' 9"  (1.753 m), weight 83.5 kg (184 lb 1.4 oz), SpO2 96.00%. No results found. Results for orders placed during the hospital encounter of 01/09/12 (from the past 72 hour(s))  GLUCOSE, CAPILLARY     Status: Abnormal   Collection Time   01/16/12 11:14 AM      Component Value Range Comment   Glucose-Capillary 159 (*) 70 - 99 (mg/dL)    Comment 1 Notify RN     GLUCOSE, CAPILLARY     Status: Abnormal   Collection Time   01/16/12  4:29 PM      Component Value Range Comment   Glucose-Capillary 116 (*) 70 - 99 (mg/dL)    Comment 1 Notify RN     GLUCOSE, CAPILLARY     Status: Abnormal   Collection Time   01/16/12  9:38 PM      Component Value Range Comment   Glucose-Capillary 122 (*) 70 - 99 (mg/dL)    Comment 1 Notify RN     GLUCOSE, CAPILLARY     Status: Abnormal   Collection Time   01/17/12  7:27 AM      Component Value Range Comment   Glucose-Capillary 122 (*) 70 - 99 (mg/dL)    Comment 1 Notify RN     GLUCOSE, CAPILLARY     Status: Abnormal   Collection Time   01/17/12 11:18 AM      Component Value Range Comment   Glucose-Capillary 144 (*) 70 - 99 (mg/dL)    Comment 1 Notify RN     BASIC METABOLIC PANEL     Status: Abnormal   Collection Time   01/18/12  6:32 AM   Component Value Range Comment   Sodium 138  135 - 145 (mEq/L)    Potassium 3.8  3.5 - 5.1 (mEq/L)    Chloride 103  96 - 112 (mEq/L)    CO2 25  19 - 32 (mEq/L)    Glucose, Bld 133 (*) 70 - 99 (mg/dL)    BUN 10  6 - 23 (mg/dL)    Creatinine, Ser 1.32  0.50 - 1.35 (mg/dL)    Calcium 8.8  8.4 - 10.5 (mg/dL)    GFR calc non Af Amer 69 (*) >90 (mL/min)    GFR calc Af Amer 80 (*) >90 (mL/min)     Physical Exam  Constitutional: He is oriented to person, place, and time. He appears well-developed and well-nourished.  HENT:  Head: Normocephalic and atraumatic.  Right Ear: External ear normal.  Left Ear: External ear normal.  Nose: Nose normal.  Mouth/Throat: Oropharynx is clear and moist.  Eyes: Conjunctivae and EOM are normal. Pupils are equal, round, and reactive to light.  Neck: Normal range of motion. Neck supple.  Cardiovascular: Normal rate and regular rhythm. Exam reveals no gallop  and no friction rub.  No murmur heard.  Pulmonary/Chest: Effort normal and breath sounds normal. No respiratory distress.  Abdominal: Soft. Bowel sounds are normal. He exhibits no distension.  Neurological: He is alert and oriented to person, place, and time. A cranial nerve deficit (nystagmus to right lateral field) is present. Coordination abnormal.  Follows basic 1 and 2 step commands without difficulty. Speech soft and raspy (not baseline). Slight dysarthria. Right upper and lower limb ataxia mild/ moderate on finger to nose andmild heel shin testing is persistent. RUE with pronator drift. Strength fairly well preserved. Sensation grossly intact in all 4's and in face Voice is hoarse no dysarthria Skin: Skin is warm and dry.  Psychiatric: He has a normal mood and affect. His behavior is normal. Judgment and thought content normal  Assessment/Plan: 1. 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 Physiatrist is providing close team supervision and 24 hour management of active medical problems listed below. Physiatrist and rehab team continue to assess barriers to discharge/monitor patient progress toward functional and medical goals. FIM: FIM - Bathing Bathing Steps Patient Completed: Chest;Right Arm;Left Arm;Abdomen;Front perineal area;Buttocks;Right upper leg;Left upper leg;Right lower leg (including foot);Left lower leg (including foot) Bathing: 4: Steadying assist  FIM - Upper Body Dressing/Undressing Upper body dressing/undressing steps patient completed: Thread/unthread right sleeve of pullover shirt/dresss;Thread/unthread left sleeve of pullover shirt/dress;Put head through opening of pull over shirt/dress;Pull shirt over trunk Upper body dressing/undressing: 5: Set-up assist to: Obtain clothing/put away FIM - Lower Body Dressing/Undressing Lower body dressing/undressing steps patient completed: Thread/unthread left underwear leg;Pull underwear up/down;Thread/unthread right pants leg;Thread/unthread left pants leg;Pull pants up/down;Don/Doff right sock;Don/Doff left sock;Don/Doff right shoe;Don/Doff left shoe;Thread/unthread right underwear leg;Fasten/unfasten right shoe;Fasten/unfasten left shoe Lower body dressing/undressing: 4: Min-Patient completed 75 plus % of tasks  FIM - Toileting Toileting steps completed by patient: Adjust clothing after toileting;Performs perineal hygiene;Adjust clothing prior to toileting Toileting Assistive Devices: Grab bar or rail for support Toileting: 7: Independent: No helper, no device (seated with urinal)  FIM - Diplomatic Services operational officer Devices: Grab bars Toilet Transfers: 4-To toilet/BSC: Min A (steadying Pt. > 75%);4-From toilet/BSC: Min A (steadying Pt. > 75%)  FIM - Bed/Chair Transfer Bed/Chair Transfer Assistive Devices:  (squat pivot) Bed/Chair Transfer: 4: Chair or W/C > Bed: Min A  (steadying Pt. > 75%);5: Sit > Supine: Supervision (verbal cues/safety issues)  FIM - Locomotion: Wheelchair Distance: 150' Locomotion: Wheelchair: 5: Travels 150 ft or more: maneuvers on rugs and over door sills with supervision, cueing or coaxing FIM - Locomotion: Ambulation Locomotion: Ambulation Assistive Devices: Designer, industrial/product Ambulation/Gait Assistance: 3: Mod assist Locomotion: Ambulation: 2: Travels 50 - 149 ft with moderate assistance (Pt: 50 - 74%)  Comprehension Comprehension Mode: Auditory Comprehension: 6-Follows complex conversation/direction: With extra time/assistive device  Expression Expression Mode: Verbal Expression: 6-Expresses complex ideas: With extra time/assistive device  Social Interaction Social Interaction: 6-Interacts appropriately with others with medication or extra time (anti-anxiety, antidepressant).  Problem Solving Problem Solving: 5-Solves complex 90% of the time/cues < 10% of the time  Memory Memory: 4-Recognizes or recalls 75 - 89% of the time/requires cueing 10 - 24% of the time    Medical Problem List and Plan:  1. DVT Prophylaxis/Anticoagulation: Lovenox   2. Pain Management: N/A   3. Mood: Motivated. LSCW to follow up for formal evaluation. Mood fairly upbeat at present.   4. HTN: labile.  Started on lisinopril and norvascon 04/18.still with systolic elevation.    Monitor with bid checks.  Norvasc increased to 10mg  5. Dysphagia: Continue D3 diet with nectar liquids and full supervision to prevent aspiration. D/C IVF 2 days ago Monitor intake and check lytes for hydration status.  Intake a little better yesterday. Labs yesterday ok  6. Impaired Fasting Glucose:  Hgb AIC@6 .3. CBGs ok,. Will D/C CBGs  7. B 12 deficiency: IM injection last done 4/19  LOS (Days) 10 A FACE TO FACE EVALUATION WAS PERFORMED  Naviyah Schaffert T 01/19/2012, 9:23 AM

## 2012-01-20 LAB — GLUCOSE, CAPILLARY
Glucose-Capillary: 135 mg/dL — ABNORMAL HIGH (ref 70–99)
Glucose-Capillary: 121 mg/dL — ABNORMAL HIGH (ref 70–99)

## 2012-01-20 NOTE — Progress Notes (Signed)
Patient ID: MARVIN GRABILL, male   DOB: July 27, 1930, 78 y.o.   MRN: [id number] Patient ID: MISHAEL HARAN, male   DOB: 1930-03-05, 78 y.o.   MRN: [id number] Patient ID: QUINN QUAM, male   DOB: 03-04-1930, 78 y.o.   MRN: [id number] Subjective/Complaints: no new issues. Intake fair. Review of Systems  Constitutional: Positive for malaise/fatigue.  HENT: Negative.   Gastrointestinal: Negative for abdominal pain and constipation.  Genitourinary: Negative for dysuria and urgency.  Musculoskeletal: Negative for myalgias.  Skin: Negative.   Neurological: Positive for dizziness.  Psychiatric/Behavioral: Negative for depression.               Objective: Vital Signs: Blood pressure 130/76, pulse 87, temperature 98.9 F (37.2 C), temperature source Oral, resp. rate 18, height 5\' 9"  (1.753 m), weight 83.5 kg (184 lb 1.4 oz), SpO2 95.00%. No results found. Results for orders placed during the hospital encounter of 01/09/12 (from the past 72 hour(s))  GLUCOSE, CAPILLARY     Status: Abnormal   Collection Time   01/17/12 11:18 AM      Component Value Range Comment   Glucose-Capillary 144 (*) 70 - 99 (mg/dL)    Comment 1 Notify RN     BASIC METABOLIC PANEL     Status: Abnormal   Collection Time   01/18/12  6:32 AM      Component Value Range Comment   Sodium 138  135 - 145 (mEq/L)    Potassium 3.8  3.5 - 5.1 (mEq/L)    Chloride 103  96 - 112 (mEq/L)    CO2 25  19 - 32 (mEq/L)    Glucose, Bld 133 (*) 70 - 99 (mg/dL)    BUN 10  6 - 23 (mg/dL)    Creatinine, Ser 0.86  0.50 - 1.35 (mg/dL)    Calcium 8.8  8.4 - 10.5 (mg/dL)    GFR calc non Af Amer 69 (*) >90 (mL/min)    GFR calc Af Amer 80 (*) >90 (mL/min)   BASIC METABOLIC PANEL     Status: Abnormal   Collection Time   01/19/12  8:48 AM      Component Value Range Comment   Sodium 137  135 - 145 (mEq/L)    Potassium 3.7  3.5 - 5.1 (mEq/L)    Chloride 102  96 - 112 (mEq/L)    CO2 25  19 - 32 (mEq/L)    Glucose, Bld 166 (*) 70 - 99 (mg/dL)    BUN 13  6 -  23 (mg/dL)    Creatinine, Ser 5.78  0.50 - 1.35 (mg/dL)    Calcium 8.9  8.4 - 10.5 (mg/dL)    GFR calc non Af Amer 61 (*) >90 (mL/min)    GFR calc Af Amer 71 (*) >90 (mL/min)   GLUCOSE, CAPILLARY     Status: Abnormal   Collection Time   01/20/12  7:37 AM      Component Value Range Comment   Glucose-Capillary 135 (*) 70 - 99 (mg/dL)    Comment 1 Notify RN       Physical Exam  Constitutional: He is oriented to person, place, and time. He appears well-developed and well-nourished.  HENT:  Head: Normocephalic and atraumatic.  Right Ear: External ear normal.  Left Ear: External ear normal.  Nose: Nose normal.  Mouth/Throat: Oropharynx is clear and moist.  Eyes: Conjunctivae and EOM are normal. Pupils are equal, round, and reactive to light.  Neck: Normal range of motion. Neck supple.  Cardiovascular: Normal rate and regular rhythm. Exam reveals no gallop and no friction rub.  No murmur heard.  Pulmonary/Chest: Effort normal and breath sounds normal. No respiratory distress.  Abdominal: Soft. Bowel sounds are normal. He exhibits no distension.  Neurological: He is alert and oriented to person, place, and time. A cranial nerve deficit (nystagmus to right lateral field) is present. Coordination abnormal.  Follows basic 1 and 2 step commands without difficulty. Speech soft and raspy (not baseline). Slight dysarthria. Right upper and lower limb ataxia mild/ moderate on finger to nose andmild heel shin testing is persistent. RUE with pronator drift. Strength fairly well preserved. Sensation grossly intact in all 4's and in face Voice is hoarse no dysarthria Skin: Skin is warm and dry.  Psychiatric: He has a flat mood and affect. His behavior is normal. Judgment and thought content normal  Assessment/Plan: 1. 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 Physiatrist is providing close team supervision and 24 hour management of active medical problems listed below. Physiatrist and rehab team continue to assess barriers to discharge/monitor patient progress toward functional and medical goals. FIM: FIM - Bathing Bathing Steps Patient Completed: Chest;Right Arm;Left Arm;Abdomen;Front perineal area;Buttocks;Right upper leg;Left upper leg;Right lower leg (including foot);Left lower leg (including foot) Bathing: 4: Steadying assist  FIM - Upper Body Dressing/Undressing Upper body dressing/undressing steps patient completed: Thread/unthread right sleeve of pullover shirt/dresss;Thread/unthread left sleeve of pullover shirt/dress;Put head through opening of pull over shirt/dress;Pull shirt over trunk Upper body dressing/undressing: 5: Set-up assist to: Obtain clothing/put away FIM - Lower Body Dressing/Undressing Lower body dressing/undressing steps patient completed: Thread/unthread left underwear leg;Pull underwear up/down;Thread/unthread right pants leg;Thread/unthread left pants leg;Pull pants up/down;Don/Doff right sock;Don/Doff left sock;Don/Doff right shoe;Don/Doff left shoe;Thread/unthread right underwear leg Lower body dressing/undressing: 4: Steadying Assist  FIM - Toileting Toileting steps completed by patient: Adjust clothing after toileting;Performs perineal hygiene;Adjust clothing prior to toileting Toileting Assistive Devices: Grab bar or rail for support Toileting: 0: Activity did not occur  FIM - Diplomatic Services operational officer Devices: Grab bars Toilet Transfers: 0-Activity did not occur  FIM - Banker Devices:  (squat pivot) Bed/Chair Transfer: 4: Chair or W/C > Bed: Min A (steadying Pt. > 75%);5: Sit > Supine: Supervision (verbal cues/safety issues)  FIM - Locomotion: Wheelchair Distance: 150' Locomotion: Wheelchair: 5: Travels 150 ft or more: maneuvers on rugs and  over door sills with supervision, cueing or coaxing FIM - Locomotion: Ambulation Locomotion: Ambulation Assistive Devices: Designer, industrial/product Ambulation/Gait Assistance: 3: Mod assist Locomotion: Ambulation: 1: Travels less than 50 ft with moderate assistance (Pt: 50 - 74%)  Comprehension Comprehension Mode: Auditory Comprehension: 6-Follows complex conversation/direction: With extra time/assistive device  Expression Expression Mode: Verbal Expression: 5-Expresses basic needs/ideas: With extra time/assistive device  Social Interaction Social Interaction: 6-Interacts appropriately with others with medication or extra time (anti-anxiety, antidepressant).  Problem Solving Problem Solving: 4-Solves basic 75 - 89% of the time/requires cueing 10 - 24% of the time  Memory Memory: 4-Recognizes or recalls 75 - 89% of the time/requires cueing 10 - 24% of the time    Medical Problem List and Plan:  1. DVT Prophylaxis/Anticoagulation: Lovenox   2. Pain Management: N/A   3. Mood: Motivated. LSCW to follow up for formal evaluation. Mood fairly upbeat at present.   4. HTN: improving.  Started on lisinopril and norvascon 04/18..    Monitor  with bid checks.  Norvasc increased to 10mg  5. Dysphagia: Continue D3 diet with nectar liquids and full supervision to prevent aspiration. D/C IVF 2 days ago Monitor intake and check lytes for hydration status.  Intake a little better overall. Continue to monitor closely  6. Impaired Fasting Glucose:  cbg's have been normal.  7. B 12 deficiency: IM injection last done 4/19  LOS (Days) 11 A FACE TO FACE EVALUATION WAS PERFORMED  SWARTZ,ZACHARY T 01/20/2012, 9:05 AM

## 2012-01-20 NOTE — Progress Notes (Signed)
Occupational Therapy Session Note  Patient Details  Name: Shane Juarez MRN: [id number] Date of Birth: 01/26/1930  Today's Date: 01/20/2012 Time:  - 0800-0855    Short Term Goals: Week 3:     Skilled Therapeutic Interventions/Progress Updates:    Pt. Lying in bed upon OT arrival.  Pt eating breakfast.  Addressed bed mobility, standing balance, functional mobility.  Pt. Used RW to shower stall with moderate assist and moderate cues to stay within the walker.  Needed supervision with dynamic sitting while in shower.     Therapy Documentation Precautions:  Precautions Precautions: Fall Precaution Comments: water protocol  Restrictions Weight Bearing Restrictions: No Other Position/Activity Restrictions: Severe fall and lean to the right side with decreased awareness to fix it.    Pain: none   ADL: ADL Film/video editor: ADL Comments: Pt showered today.  .  Still with significant lean to the right side in standing requiring mod assist at times to self correct.   See FIM for current functional status  Therapy/Group: Individual Therapy  Humberto Seals 01/20/2012, 8:46 AM

## 2012-01-21 NOTE — Progress Notes (Signed)
Physical Therapy Note  Patient Details  Name: Shane Juarez MRN: [id number] Date of Birth: 18-Dec-1929 Today's Date: 01/21/2012  14:00-14:30 individual therapy pt denied pain  Bed mobility supervision, transfer squat pivot to wc supervision pt able to set up wc with supervision 2 question cues and increased time to manage foot rest. Performed dynamic standing at table focusing on weight shift and squat to left x 15 and rt. Toe taps on cone into abduction x 15. Rt hand was placed closer to body than pt preference to decrease full dependence on balance with arms. 1 LOB. Sit to stand min assist with question cues for technique.   [name] 01/21/2012, 3:20 PM

## 2012-01-21 NOTE — Progress Notes (Signed)
Occupational Therapy Session Note  Patient Details  Name: Shane Juarez MRN: [id number] Date of Birth: [birth year]  Today's Date: 01/21/2012 Time: 1130-1200 Time Calculation (min): 30 min   Skilled Therapeutic Interventions/Progress Updates:    Worked on self feeding in group setting.  Focus on RUE use as a dominant hand.  Pt able to self feed including helping to open small packets using the RUE.    Therapy Documentation  Pain: Pain Assessment Pain Assessment: No/denies pain   See FIM for current functional status  Therapy/Group: Group Therapy  Legna Mausolf 01/21/2012, 1:13 PM

## 2012-01-21 NOTE — Progress Notes (Signed)
Physical Therapy Note  Patient Details  Name: AMRAM MAYA MRN: [id number] Date of Birth: 07-03-1930 Today's Date: 01/21/2012  10:30-11:20 individual therapy pt denied pain.  wc mobility 2 x 150' supervision/ mod I. Pt able to perform  wc set up for transfer, pt parked to perform a 180 degree transfer and was able to transfer x 1 safely. Initial gait with rw mod assist with heavy dependence on UEs x 75' including sidesteps with handrail to rt with squats for improved weight shift to left. Performed quadruped hip extension x 10 each, high kneel activities to decrease anterior LOB and upright posture. Gait performed with SBQC mod to max assist with vc for weight shift to left and foot placement on rt. Performed dynamic standing to kick ball with rt. foot increase weight shift on left.   [name] 01/21/2012, 12:31 PM

## 2012-01-21 NOTE — Progress Notes (Signed)
Speech Language Pathology Daily Session Note  Patient Details  Name: Shane Juarez MRN: [id number] Date of Birth: 04-17-1930  Today's Date: 01/21/2012 Time: 1130-1200 Time Calculation (min): 30 min  Short Term Goals: Week 2: SLP Short Term Goal 1 (Week 2): Patient will consume Dys. 3 and thin liquids without observed s/s of aspiraiton and supervision semantic cues to utilize strategies SLP Short Term Goal 2 (Week 2): Patient will consume trials of regualr textures without observed s/s of aspiration  Skilled Therapeutic Interventions: Group Session focused on dysphagia treatment with SLP facilitating session with minimal faded to supervision assist semantic cues to follow safe swallow strategies: small portions, clear oral residue of Dys.3 textures prior to sips of thin liquids with patient exhibiting cough x1 due to large bite and sip from the cup.  SLP suspects and educated paiten and wife regarding mixed consistencies and how that is more difficult to manage.  Patient tried thin liquids with ice and also demonstrated an immediate cough response; he was able to verbally explain what happened and sated "I am not ready for ice."   Daily Session Pain Pain Assessment Pain Assessment: No/denies pain Pain Score: 0-No pain  Therapy/Group: Individual Therapy  Charlane Ferretti., CCC-SLP [phone]  Ajdin Macke 01/21/2012, 1:13 PM

## 2012-01-21 NOTE — Progress Notes (Signed)
Per State Regulation 482.30 This chart was reviewed for medical necessity with respect to the patient's Admission/Duration of stay. Pt participating in therapies with steady progress.  [name]                 Nurse Care Manager            Next Review Date: 01/23/12

## 2012-01-21 NOTE — Progress Notes (Signed)
Speech Language Pathology Daily Session Note  Patient Details  Name: Shane Juarez MRN: [id number] Date of Birth: Aug 03, 1930  Today's Date: 01/21/2012 Time: 1400-1430 Time Calculation (min): 30 min  Short Term Goals: Week 2: SLP Short Term Goal 1 (Week 2): Patient will consume Dys. 3 and thin liquids without observed s/s of aspiraiton and supervision semantic cues to utilize strategies SLP Short Term Goal 2 (Week 2): Patient will consume trials of regualr textures without observed s/s of aspiration  Skilled Therapeutic Interventions: Session focused on dysphagia treatment; SLP facilitated session with set up assist of regular textures and thin liquids with supervision semantic cues to consume thin liquids via cup with small single cups sips and slowly self feed to allow increased wait time for 1-2 swallows per bite/sip.  Patient demonstrated effective mastication; however when regular textures were followed by big or consecutive sips he demonstrated a cough due to suspected penetration.  With cues to produce a hard effortful cough patient appeared to clear penetrates.  SLP also facilitated transfer from chair to bed with supervision semantic cues to sequence and problem solve task.    Patient with functional improvement and recommend upgrade to regular textures with continued full supervision   Daily Session FIM:  Comprehension Comprehension Mode: Auditory Comprehension: 5-Understands basic 90% of the time/requires cueing < 10% of the time Expression Expression Mode: Verbal Expression: 5-Expresses basic needs/ideas: With extra time/assistive device Social Interaction Social Interaction: 6-Interacts appropriately with others with medication or extra time (anti-anxiety, antidepressant). Problem Solving Problem Solving: 5-Solves basic 90% of the time/requires cueing < 10% of the time Memory Memory: 4-Recognizes or recalls 75 - 89% of the time/requires cueing 10 - 24% of the time FIM -  Eating Eating Activity: 5: Supervision/cues;5: Set-up assist for open containers;5: Set-up assist for cut food General    Pain Pain Assessment Pain Assessment: No/denies pain Pain Score: 0-No pain  Therapy/Group: Individual Therapy  Charlane Ferretti., CCC-SLP [phone]  Sallye Lunz 01/21/2012, 3:24 PM

## 2012-01-21 NOTE — Progress Notes (Signed)
Occupational Therapy Session Note  Patient Details  Name: Shane Juarez MRN: [id number] Date of Birth: April 16, 1930  Today's Date: 01/21/2012 Time: 0902-1000 Time Calculation (min): 58 min  Short Term Goals: Week 2:  OT Short Term Goal 1 (Week 2): Pt will perfrom toilet transfers stand pivot with min assist 3 consecutive trials. OT Short Term Goal 2 (Week 2): Pt will perfrom LB bathing with supervision sit to stand 3/3 sessions. OT Short Term Goal 3 (Week 2): Pt will perfrom LB dressing sit to stand with supervision for 3 consecutive sessions. OT Short Term Goal 4 (Week 2): Pt will perfrom clothing management and toilet hygiene sit to stand with min assist on 3 consecutive attempts.  Skilled Therapeutic Interventions/Progress Updates:    Pt performed bathing sitting on seat in the shower.  Needed mod facilitation for transfer to and from the shower using the RW.  Pt pushing walker too far out in front and also leaning too far to the right side.  Min assist for sit to stand in the shower as well as while at the sink performing dressing and grooming tasks.  With dynamic balance pt exhibits frequent LOB to the right in standing.  Went to the gym after bathing and dressing and focused on using the [employer] with emphasis on weightshifts to the left in standing including left anterior and posterior.  Let pt hold onto the grab bars initially, and also needed min facilitation to help him shift his hips and shoulders together.  Eventually able to let go of one grab bar with his right hand but still needs min facilitation to shift forward and backwards on the right side.  Attempts to shift his hips but not as much his upper body and torso.   Therapy Documentation  ADL: See FIM for current functional status  Therapy/Group: Individual Therapy  Valia Wingard 01/21/2012, 1:00 PM

## 2012-01-21 NOTE — Progress Notes (Signed)
Patient ID: Shane Juarez, male   DOB: [birth year], 78 y.o.   MRN: [id number] Subjective/Complaints: no new issues. Intake fair. Review of Systems  Constitutional: Positive for malaise/fatigue.  HENT: Negative.   Gastrointestinal: Negative for abdominal pain and constipation.  Genitourinary: Negative for dysuria and urgency.  Musculoskeletal: Negative for myalgias.  Skin: Negative.   Neurological: Positive for dizziness.  Psychiatric/Behavioral: Negative for depression.               Objective: Vital Signs: Blood pressure 122/69, pulse 88, temperature 98 F (36.7 C), temperature source Oral, resp. rate 18, height 5\' 9"  (1.753 m), weight 83.5 kg (184 lb 1.4 oz), SpO2 98.00%. No results found. Results for orders placed during the hospital encounter of 01/09/12 (from the past 72 hour(s))  BASIC METABOLIC PANEL     Status: Abnormal   Collection Time   01/19/12  8:48 AM      Component Value Range Comment   Sodium 137  135 - 145 (mEq/L)    Potassium 3.7  3.5 - 5.1 (mEq/L)    Chloride 102  96 - 112 (mEq/L)    CO2 25  19 - 32 (mEq/L)    Glucose, Bld 166 (*) 70 - 99 (mg/dL)    BUN 13  6 - 23 (mg/dL)    Creatinine, Ser 8.29  0.50 - 1.35 (mg/dL)    Calcium 8.9  8.4 - 10.5 (mg/dL)    GFR calc non Af Amer 61 (*) >90 (mL/min)    GFR calc Af Amer 71 (*) >90 (mL/min)   GLUCOSE, CAPILLARY     Status: Abnormal   Collection Time   01/20/12  7:37 AM      Component Value Range Comment   Glucose-Capillary 135 (*) 70 - 99 (mg/dL)    Comment 1 Notify RN     GLUCOSE, CAPILLARY     Status: Abnormal   Collection Time   01/20/12 11:59 AM      Component Value Range Comment   Glucose-Capillary 121 (*) 70 - 99 (mg/dL)    Comment 1 Notify RN       Physical Exam  Constitutional: He is oriented to person, place, and time. He appears well-developed and well-nourished.  HENT:  Head: Normocephalic and atraumatic.  Right Ear: External ear normal.  Left Ear: External ear normal.  Nose: Nose normal.    Mouth/Throat: Oropharynx is clear and moist.  Eyes: Conjunctivae and EOM are normal. Pupils are equal, round, and reactive to light.  Neck: Normal range of motion. Neck supple.  Cardiovascular: Normal rate and regular rhythm. Exam reveals no gallop and no friction rub.  No murmur heard.  Pulmonary/Chest: Effort normal and breath sounds normal. No respiratory distress.  Abdominal: Soft. Bowel sounds are normal. He exhibits no distension.  Neurological: He is alert and oriented to person, place, and time. A cranial nerve deficit (nystagmus to right lateral field) is present. Coordination abnormal.  Follows basic 1 and 2 step commands without difficulty. Speech soft and raspy (not baseline). Slight dysarthria. Right upper and lower limb ataxia mild/ moderate on finger to nose and mild heel shin testing is persistent. RUE with pronator drift. Strength fairly well preserved. Sensation grossly intact in all 4's and in face Voice is hoarse no dysarthria Skin: Skin is warm and dry.  Psychiatric: He has a flat mood and affect. His behavior is normal. Judgment and thought content normal  Assessment/Plan: 1. 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 Physiatrist is providing close team supervision and 24 hour management of active medical problems listed below. Physiatrist and rehab team continue to assess barriers to discharge/monitor patient progress toward functional and medical goals. FIM: FIM - Bathing Bathing Steps Patient Completed: Chest;Right Arm;Left Arm;Abdomen;Front perineal area;Buttocks;Right upper leg;Left upper leg;Right lower leg (including foot);Left lower leg (including foot) Bathing: 4: Steadying assist  FIM - Upper Body Dressing/Undressing Upper body dressing/undressing steps patient completed: Thread/unthread right sleeve of pullover  shirt/dresss;Thread/unthread left sleeve of pullover shirt/dress;Put head through opening of pull over shirt/dress;Pull shirt over trunk Upper body dressing/undressing: 5: Set-up assist to: Obtain clothing/put away FIM - Lower Body Dressing/Undressing Lower body dressing/undressing steps patient completed: Thread/unthread left underwear leg;Pull underwear up/down;Thread/unthread right pants leg;Thread/unthread left pants leg;Pull pants up/down;Don/Doff right sock;Don/Doff left sock;Don/Doff right shoe;Don/Doff left shoe;Thread/unthread right underwear leg Lower body dressing/undressing: 4: Steadying Assist  FIM - Toileting Toileting steps completed by patient: Adjust clothing after toileting;Performs perineal hygiene;Adjust clothing prior to toileting Toileting Assistive Devices: Grab bar or rail for support Toileting: 4: Steadying assist  FIM - Diplomatic Services operational officer Devices: Grab bars Toilet Transfers: 0-Activity did not occur  FIM - Banker Devices:  (squat pivot) Bed/Chair Transfer: 4: Bed > Chair or W/C: Min A (steadying Pt. > 75%);4: Chair or W/C > Bed: Min A (steadying Pt. > 75%)  FIM - Locomotion: Wheelchair Distance: 150' Locomotion: Wheelchair: 5: Travels 150 ft or more: maneuvers on rugs and over door sills with supervision, cueing or coaxing FIM - Locomotion: Ambulation Locomotion: Ambulation Assistive Devices: Designer, industrial/product Ambulation/Gait Assistance: 3: Mod assist Locomotion: Ambulation: 1: Travels less than 50 ft with moderate assistance (Pt: 50 - 74%)  Comprehension Comprehension Mode: Auditory Comprehension: 6-Follows complex conversation/direction: With extra time/assistive device  Expression Expression Mode: Verbal Expression: 5-Expresses basic needs/ideas: With extra time/assistive device  Social Interaction Social Interaction: 6-Interacts appropriately with others with medication or extra time  (anti-anxiety, antidepressant).  Problem Solving Problem Solving: 4-Solves basic 75 - 89% of the time/requires cueing 10 - 24% of the time  Memory Memory: 4-Recognizes or recalls 75 - 89% of the time/requires cueing 10 - 24% of the time    Medical Problem List and Plan:  1. DVT Prophylaxis/Anticoagulation: Lovenox   2. Pain Management: N/A   3. Mood: Motivated. Mood fairly upbeat at present. No signs of post CVA depression  4. HTN: controlled.  Started on lisinopril and norvascon 04/18..    Monitor with bid checks.  Norvasc increased to 10mg  5. Dysphagia: Continue D3 diet with nectar liquids and full supervision to prevent aspiration. D/C IVF 2 days ago Monitor intake and check lytes for hydration status.  Intake a little better overall. Continue to monitor closely  6. Impaired Fasting Glucose:  cbg's have been normal.  7. B 12 deficiency: IM injection last done 4/19  LOS (Days) 12 A FACE TO FACE EVALUATION WAS PERFORMED  Amarri Satterly E 01/21/2012, 8:44 AM

## 2012-01-22 DIAGNOSIS — Z5189 Encounter for other specified aftercare: Secondary | ICD-10-CM

## 2012-01-22 DIAGNOSIS — G8110 Spastic hemiplegia affecting unspecified side: Secondary | ICD-10-CM

## 2012-01-22 DIAGNOSIS — I6330 Cerebral infarction due to thrombosis of unspecified cerebral artery: Secondary | ICD-10-CM

## 2012-01-22 MED ORDER — LISINOPRIL 20 MG PO TABS
20.0000 mg | ORAL_TABLET | Freq: Every day | ORAL | Status: DC
  Administered 2012-01-22 – 2012-01-29 (×8): 20 mg via ORAL
  Filled 2012-01-22 (×10): qty 1

## 2012-01-22 NOTE — Progress Notes (Signed)
Physical Therapy Note  Patient Details  Name: Shane Juarez MRN: [id number] Date of Birth: [birth year] Today's Date: 01/22/2012  13:45-14:15 individual therapy pt denied pain.  Continued family education with wife instructing pt in squat transfers and car transfer x 4 to car and x 4 wc to high bed. vc needed for wc placement, hand placement, foot position and controlled speed. Supervision each transfer with decreased safety awareness.   [name] 01/22/2012, 2:17 PM

## 2012-01-22 NOTE — Progress Notes (Signed)
Patient ID: Shane Juarez, male   DOB: 09-09-1930, 78 y.o.   MRN: [id number] Subjective/Complaints: no new issues. Intake fair."my pressure top number stays hi at home" Review of Systems  Constitutional: Positive for malaise/fatigue.  HENT: Negative.   Gastrointestinal: Negative for abdominal pain and constipation.  Genitourinary: Negative for dysuria and urgency.  Musculoskeletal: Negative for myalgias.  Skin: Negative.   Neurological: Positive for dizziness.  Psychiatric/Behavioral: Negative for depression.               Objective: Vital Signs: Blood pressure 155/84, pulse 68, temperature 98.4 F (36.9 C), temperature source Oral, resp. rate 17, height 5\' 9"  (1.753 m), weight 82.1 kg (181 lb), SpO2 99.00%. No results found. Results for orders placed during the hospital encounter of 01/09/12 (from the past 72 hour(s))  BASIC METABOLIC PANEL     Status: Abnormal   Collection Time   01/19/12  8:48 AM      Component Value Range Comment   Sodium 137  135 - 145 (mEq/L)    Potassium 3.7  3.5 - 5.1 (mEq/L)    Chloride 102  96 - 112 (mEq/L)    CO2 25  19 - 32 (mEq/L)    Glucose, Bld 166 (*) 70 - 99 (mg/dL)    BUN 13  6 - 23 (mg/dL)    Creatinine, Ser 9.62  0.50 - 1.35 (mg/dL)    Calcium 8.9  8.4 - 10.5 (mg/dL)    GFR calc non Af Amer 61 (*) >90 (mL/min)    GFR calc Af Amer 71 (*) >90 (mL/min)   GLUCOSE, CAPILLARY     Status: Abnormal   Collection Time   01/20/12  7:37 AM      Component Value Range Comment   Glucose-Capillary 135 (*) 70 - 99 (mg/dL)    Comment 1 Notify RN     GLUCOSE, CAPILLARY     Status: Abnormal   Collection Time   01/20/12 11:59 AM      Component Value Range Comment   Glucose-Capillary 121 (*) 70 - 99 (mg/dL)    Comment 1 Notify RN       Physical Exam  Constitutional: He is oriented to person, place, and time. He appears well-developed and well-nourished.  HENT:  Head: Normocephalic and atraumatic.  Right Ear: External ear normal.  Left Ear: External ear  normal.  Nose: Nose normal.  Mouth/Throat: Oropharynx is clear and moist.  Eyes: Conjunctivae and EOM are normal. Pupils are equal, round, and reactive to light.  Neck: Normal range of motion. Neck supple.  Cardiovascular: Normal rate and regular rhythm. Exam reveals no gallop and no friction rub.  No murmur heard.  Pulmonary/Chest: Effort normal and breath sounds normal. No respiratory distress.  Abdominal: Soft. Bowel sounds are normal. He exhibits no distension.  Neurological: He is alert and oriented to person, place, and time. A cranial nerve deficit (nystagmus to right lateral field) is present. Coordination abnormal.  Follows basic 1 and 2 step commands without difficulty. Speech soft and raspy (not baseline). Slight dysarthria. Right upper and lower limb ataxia mild/ moderate on finger to nose and mild heel shin testing is persistent. RUE with pronator drift. Strength fairly well preserved. Sensation grossly intact in all 4's and in face Voice is hoarse no dysarthria Skin: Skin is warm and dry.  Psychiatric: He has a flat mood and affect. His behavior is normal. Judgment and thought content normal  Assessment/Plan: 1. 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 Physiatrist is providing close team supervision and 24 hour management of active medical problems listed below. Physiatrist and rehab team continue to assess barriers to discharge/monitor patient progress toward functional and medical goals. FIM: FIM - Bathing Bathing Steps Patient Completed: Chest;Right Arm;Left Arm;Abdomen;Front perineal area;Buttocks;Right upper leg;Left upper leg;Right lower leg (including foot);Left lower leg (including foot) Bathing: 4: Steadying assist  FIM - Upper Body Dressing/Undressing Upper body dressing/undressing steps patient completed: Thread/unthread right  sleeve of pullover shirt/dresss;Put head through opening of pull over shirt/dress;Pull shirt over trunk;Thread/unthread left sleeve of pullover shirt/dress Upper body dressing/undressing: 5: Set-up assist to: Obtain clothing/put away FIM - Lower Body Dressing/Undressing Lower body dressing/undressing steps patient completed: Thread/unthread right underwear leg;Thread/unthread left underwear leg;Pull underwear up/down;Thread/unthread right pants leg;Thread/unthread left pants leg;Pull pants up/down;Fasten/unfasten pants;Don/Doff right sock;Don/Doff left sock;Don/Doff right shoe;Don/Doff left shoe;Fasten/unfasten right shoe;Fasten/unfasten left shoe Lower body dressing/undressing: 4: Steadying Assist  FIM - Toileting Toileting steps completed by patient: Adjust clothing after toileting;Performs perineal hygiene;Adjust clothing prior to toileting Toileting Assistive Devices: Grab bar or rail for support Toileting: 4: Steadying assist  FIM - Diplomatic Services operational officer Devices: Grab bars Toilet Transfers: 0-Activity did not occur  FIM - Banker Devices:  (squat pivot) Bed/Chair Transfer: 4: Bed > Chair or W/C: Min A (steadying Pt. > 75%);4: Chair or W/C > Bed: Min A (steadying Pt. > 75%)  FIM - Locomotion: Wheelchair Distance: 150' Locomotion: Wheelchair: 5: Travels 150 ft or more: maneuvers on rugs and over door sills with supervision, cueing or coaxing FIM - Locomotion: Ambulation Locomotion: Ambulation Assistive Devices: Designer, industrial/product Ambulation/Gait Assistance: 3: Mod assist Locomotion: Ambulation: 1: Travels less than 50 ft with moderate assistance (Pt: 50 - 74%)  Comprehension Comprehension Mode: Auditory Comprehension: 5-Understands basic 90% of the time/requires cueing < 10% of the time  Expression Expression Mode: Verbal Expression: 5-Expresses basic needs/ideas: With no assist  Social Interaction Social Interaction:  6-Interacts appropriately with others with medication or extra time (anti-anxiety, antidepressant).  Problem Solving Problem Solving: 5-Solves basic 90% of the time/requires cueing < 10% of the time  Memory Memory: 4-Recognizes or recalls 75 - 89% of the time/requires cueing 10 - 24% of the time    Medical Problem List and Plan:  1. DVT Prophylaxis/Anticoagulation: Lovenox   2. Pain Management: N/A   3. Mood: Motivated. Mood fairly upbeat at present. No signs of post CVA depression  4. HTN: controlled.  Started on lisinopril and norvascon 04/18.Marland Kitchen    Systolic spikes Qd.  Norvasc increased to 10mg , will increase lisinopril as well.   5. Dysphagia: Continue D3 diet with nectar liquids and full supervision to prevent aspiration. D/C IVF 2 days ago Monitor intake and check lytes for hydration status.  Intake a little better overall. Continue to monitor closely  6. Impaired Fasting Glucose:  cbg's have been normal.  7. B 12 deficiency: IM injection last done 4/19  LOS (Days) 13 A FACE TO FACE EVALUATION WAS PERFORMED  Candi Profit E 01/22/2012, 8:38 AM

## 2012-01-22 NOTE — Progress Notes (Signed)
Speech Language Pathology Daily Session Note  Patient Details  Name: Shane Juarez MRN: [id number] Date of Birth: 1929/12/11  Today's Date: 01/22/2012 Time: 1145-1200 Time Calculation (min): 15 min  Short Term Goals: Week 2: SLP Short Term Goal 1 (Week 2): Patient will consume Dys. 3 and thin liquids without observed s/s of aspiraiton and supervision semantic cues to utilize strategies SLP Short Term Goal 2 (Week 2): Patient will consume trials of regualr textures without observed s/s of aspiration  Skilled Therapeutic Interventions: Group Session focused on dysphagia treatment with SLP facilitating session with supervision assist semantic cues to attend to follow safe swallow strategies while consuming regular textures and thin liquids.  Wife present and assisted with set up and supervision level semantic cues.  Patient demonstrated no overt s/s of aspiration during meal.   Daily Session Pain Pain Assessment Pain Assessment: No/denies pain Pain Score: 0-No pain  Therapy/Group: Group Therapy  Charlane Ferretti., CCC-SLP [phone]  Shane Juarez 01/22/2012, 4:11 PM

## 2012-01-22 NOTE — Progress Notes (Signed)
Occupational Therapy Session Note  Patient Details  Name: TADASHI BURKEL MRN: [id number] Date of Birth: [birth year]  Today's Date: 01/22/2012 Time: 4098-1191 Time Calculation (min): 10 min     Skilled Therapeutic Interventions/Progress Updates:    Pt seen today in [employer] w/ focus on functional use R UE to open containers, cut food and set-up. [name] was present throughout the session and was noted to cut pt's food for him. He demonstrated selective attention to others and conversations during lunch w/ occasional cuing, and demonstrated fair functional use R UE for eating/drinking tasks.  Therapy Documentation Precautions:  Precautions Precautions: Fall Precaution Comments: water protocol  Restrictions Weight Bearing Restrictions: No Other Position/Activity Restrictions: Severe fall and lean to the right side with decreased awareness to fix it.     Pain:  No pain 0/10        See FIM for current functional status  Therapy/Group: Group Therapy  Charletta Cousin, Hurshel Bouillon Beth Dixon 01/22/2012, 2:36 PM

## 2012-01-22 NOTE — Progress Notes (Signed)
Occupational Therapy Session Note  Patient Details  Name: Shane Juarez MRN: [id number] Date of Birth: Apr 07, 1930  Today's Date: 01/22/2012 Time: 0902-1000 Time Calculation (min): 58 min  Short Term Goals: Week 2:  OT Short Term Goal 1 (Week 2): Pt will perfrom toilet transfers stand pivot with min assist 3 consecutive trials. OT Short Term Goal 2 (Week 2): Pt will perfrom LB bathing with supervision sit to stand 3/3 sessions. OT Short Term Goal 3 (Week 2): Pt will perfrom LB dressing sit to stand with supervision for 3 consecutive sessions. OT Short Term Goal 4 (Week 2): Pt will perfrom clothing management and toilet hygiene sit to stand with min assist on 3 consecutive attempts.  Skilled Therapeutic Interventions/Progress Updates:    Bathing and dressing session to start.  Pt requiring mod facilitation to ambulate to the walk-in shower with hand held assist on the left side.  Performs sit to stand with min assist and after getting to standing and given min verbal cues to shift his weight he can stand with min guard assist during grooming or dressing tasks.   Pt still with significant lean to the right with dynamic activities including sit to stand, tall kneeling, squat to stand and mobility around the room.  Pt continues to need mod facilitation to maintain balance during mobility and in addition to demonstrating LOB to the right also falls forward.  Improves if given a target on the left side at eye level for reaching to help promote weight shift.    Therapy Documentation Precautions:  Precautions Precautions: Fall Precaution Comments: water protocol  Restrictions Weight Bearing Restrictions: No Other Position/Activity Restrictions: Severe fall and lean to the right side with decreased awareness to fix it.  ADL: See FIM for current functional status  Therapy/Group: Individual Therapy  Nili Honda 01/22/2012, 1:51 PM

## 2012-01-22 NOTE — Progress Notes (Signed)
Physical Therapy Note  Patient Details  Name: Shane Juarez MRN: [id number] Date of Birth: [birth year] Today's Date: 01/22/2012  10:15-11:15 individual therapy pt denied pain  Gait training home environment with rw including sidesteps, walking around cones, stepping over threshold all min to mod assist. initiated family education for transfers and sit to stand with wife.   [name] 01/22/2012, 12:46 PM

## 2012-01-23 NOTE — Progress Notes (Signed)
Physical Therapy Session Note  Patient Details  Name: Shane Juarez MRN: [id number] Date of Birth: 07/16/1930  Today's Date: 01/23/2012 Time: 4098-1191 Time Calculation (min): 60 min   Therapy Documentation Precautions:  Precautions Precautions: Fall Precaution Comments: water protocol  Restrictions Weight Bearing Restrictions: No Other Position/Activity Restrictions: Severe fall and lean to the right side with decreased awareness to fix it. Pain: Pain Assessment Pain Assessment: No/denies pain Pain Score: 0-No pain Skilled Therapeutic Interventions/Progress Updates: Focus: Postural awareness/ ability to maintain midline in various positions Tall kneel- upper trunk rotations, alternate "stepping" forward/backward, side-to-side Standing at elevated table- stagger stance with left foot in front weight shifting forward. [place]- Upper trunk rotations, weight shifting side-to-side 18cm/sec Standing against wall- ankle/ hip strategies with RW in front Gait- with RW then using rail in hallway 15-52ft multiple reps, Mod A  For above activity pt needed cues for posture and body mechanics  See FIM for current functional status  Therapy/Group: Individual Therapy   Hortencia Conradi, PTA 01/23/2012, 4:21 PM

## 2012-01-23 NOTE — Progress Notes (Signed)
Per State Regulation 482.30 This chart was reviewed for medical necessity with respect to the patient's Admission/Duration of stay. Met with pt and wife after team conference. Both understand that pt will be at w/c level for mobililty at home.  Both in agreement with planned d/c date of 01/30/12.  [name]                 Nurse Care Manager            Next Review Date: 01/28/12

## 2012-01-23 NOTE — Progress Notes (Signed)
Occupational Therapy Session Note  Patient Details  Name: Shane Juarez MRN: [id number] Date of Birth: 08/29/1930  Today's Date: 01/23/2012 Time: 6578-4696 Time Calculation (min): 15 min  Skilled Therapeutic Interventions/Progress Updates:    Pt was seen in [employer] today with focus on safe swallowing tech's and taking small bites/sips of food while using RUE. Pt continues to require occasional VC's for safety with swallowing noted, however he has been observed to use bilateral UE's functionally when opening containers and set-up of his tray.  Therapy Documentation Precautions:  Precautions Precautions: Fall Precaution Comments: water protocol  Restrictions Weight Bearing Restrictions: No Other Position/Activity Restrictions: Severe fall and lean to the right side with decreased awareness to fix it.     Pain: Pain Assessment Pain Assessment: No/denies pain Pain Score: 0-No pain     See FIM for current functional status  Therapy/Group: Group Therapy  Charletta Cousin, Esme Freund Beth Dixon 01/23/2012, 1:29 PM

## 2012-01-23 NOTE — Progress Notes (Signed)
Occupational Therapy Weekly Progress Note  Patient Details  Name: Shane Juarez MRN: [id number] Date of Birth: 1930-01-31  Today's Date: 01/23/2012 Time: 0865-7846 Time Calculation (min): 44 min  Patient has met 2 of 4 short term goals.  Continues to need mod assist for stand pivot transfers and dynamic standing balance tasks secondary to decreased midline perception and frequent LOB to the right in standing.  Statically pt can stand with close supervision but transitioning sit to stand he needs min assist for balance.  Will also demonstrate LOB forward and backwards with mobility and transfers as well.  Patient continues to demonstrate the following deficits: decreased balance, decreased strength, right side decreased coordination and therefore will continue to benefit from skilled OT intervention to enhance overall performance with BADL.  Patient progressing toward long term goals..  Continue plan of care. Will update LTGs for shower transfers to min assist.  OT Short Term Goals Week 3:  OT Short Term Goal 1 (Week 3): Pt will perform shower transfers with min assist for walk-in shower. OT Short Term Goal 2 (Week 3): Pt will perform LB bathing with supervision 3 consecutive sessions. OT Short Term Goal 3 (Week 3): Pt will perform LB dressing with supervision sit to stand. OT Short Term Goal 4 (Week 3): Pt will perfrom toilet transfer with supervision to 3:1.  Skilled Therapeutic Interventions/Progress Updates:    During session worked on showering with shower seat, overall min assist for sit to stand and standing balance to pull pants over hips.  Pt still needs mod assist for balance with functional transfers and mobility.  Able to use the RUE at a slightly diminished level but overall does not limit any functional task related to selfcare.  At conclusion of bathing and dressing worked on dynamic standing balance to increase weightshift to the right.  Had pt work on slowly lifting the right LE  and placing on stool and then slowly placing it back onto the floor.  Emphasizing speed so as to increase stance time on the left LE. Also gave pt target to touch with the left hand to help facilitate weightshift.  Therapy Documentation Precautions:  Precautions Precautions: Fall Precaution Comments: water protocol  Restrictions Weight Bearing Restrictions: No Other Position/Activity Restrictions: Severe fall and lean to the right side with decreased awareness to fix it.  Pain: Pain Assessment Pain Assessment: No/denies pain ADL: ADL Eating: Supervision/safety Where Assessed-Eating: Wheelchair Grooming: Minimal assistance Where Assessed-Grooming: Standing at sink Upper Body Bathing: Setup Where Assessed-Upper Body Bathing: Shower Lower Body Bathing: Minimal assistance Where Assessed-Lower Body Bathing: Shower Upper Body Dressing: Setup Where Assessed-Upper Body Dressing: Wheelchair;Sitting at sink Lower Body Dressing: Minimal assistance Where Assessed-Lower Body Dressing: Sitting at sink;Standing at sink;Wheelchair Toileting: Minimal assistance Where Assessed-Toileting: Psychiatrist Transfer: Minimal assistance Statistician Method: Ambulance person: Gaffer: Moderate assistance Tub/Shower Transfer Method: Ambulating Tub/Shower Equipment: Information systems manager with back Film/video editor: Moderate assistance Film/video editor Method: Manufacturing systems engineer with back ADL Comments: Pt still with frequent LOB to the right in standing especially with shower transfers ambulating.  Min assist for static standing to manage clothing over hips or while standing to perfrom grooming tasks.  See FIM for current functional status  Therapy/Group: Individual Therapy  Miski Feldpausch 01/23/2012, 11:17 AM

## 2012-01-23 NOTE — Progress Notes (Signed)
Nutrition Follow-up  Pt advanced to Regular consistency diet on 4/29. Intake much improved, pt is eating 75 - 100% of meals.  Diet Order:  CHO Modified Medium  Meds: Scheduled Meds:   . amLODipine  10 mg Oral Daily  . antiseptic oral rinse  15 mL Mouth Rinse BID  . aspirin  325 mg Oral Daily  . enoxaparin  40 mg Subcutaneous q1800  . lisinopril  20 mg Oral Daily  . sennosides  10 mL Oral QHS   Continuous Infusions:  PRN Meds:.acetaminophen, alum & mag hydroxide-simeth, bisacodyl, diphenhydrAMINE, guaiFENesin-dextromethorphan, polyethylene glycol, promethazine, promethazine, promethazine, traZODone  Labs:  CMP     Component Value Date/Time   NA 137 01/19/2012 0848   K 3.7 01/19/2012 0848   CL 102 01/19/2012 0848   CO2 25 01/19/2012 0848   GLUCOSE 166* 01/19/2012 0848   BUN 13 01/19/2012 0848   CREATININE 1.10 01/19/2012 0848   CALCIUM 8.9 01/19/2012 0848   PROT 6.4 01/10/2012 0700   ALBUMIN 3.0* 01/10/2012 0700   AST 15 01/10/2012 0700   ALT 13 01/10/2012 0700   ALKPHOS 75 01/10/2012 0700   BILITOT 0.4 01/10/2012 0700   GFRNONAA 61* 01/19/2012 0848   GFRAA 71* 01/19/2012 0848   CBG (last 3)  No results found for this basename: GLUCAP:3 in the last 72 hours   Intake/Output Summary (Last 24 hours) at 01/23/12 1518 Last data filed at 01/23/12 0815  Gross per 24 hour  Intake    480 ml  Output      0 ml  Net    480 ml   Weight Status:  82.2 kg - down 1.3 kg x 1 week  Estimated needs:  1600 - 1800 kcal, 75 - 90 grams protein  Nutrition Dx:  Swallowing difficulty r/t recent CVA AEB need for thickened liquids and ST; MBSS results. Resolved.  Goal:  Pt to consume at least 75% of meals. Improved.  Intervention:  Continue to encourage meals as able.  Monitor:  PO intake, weights, labs, I/O's  Adair Laundry Pager #:  [phone]

## 2012-01-23 NOTE — Progress Notes (Signed)
Patient ID: Shane Juarez, male   DOB: 11-Nov-1929, 78 y.o.   MRN: [id number] Subjective/Complaints: In [employer].  Tolerating diet, no choking Review of Systems  Constitutional: Positive for malaise/fatigue.  HENT: Negative.   Gastrointestinal: Negative for abdominal pain and constipation.  Genitourinary: Negative for dysuria and urgency.  Musculoskeletal: Negative for myalgias.  Skin: Negative.   Neurological: Positive for dizziness.  Psychiatric/Behavioral: Negative for depression.               Objective: Vital Signs: Blood pressure 128/80, pulse 77, temperature 98.8 F (37.1 C), temperature source Oral, resp. rate 18, height 5\' 9"  (1.753 m), weight 82.2 kg (181 lb 3.5 oz), SpO2 99.00%. No results found. Results for orders placed during the hospital encounter of 01/09/12 (from the past 72 hour(s))  GLUCOSE, CAPILLARY     Status: Abnormal   Collection Time   01/20/12 11:59 AM      Component Value Range Comment   Glucose-Capillary 121 (*) 70 - 99 (mg/dL)    Comment 1 Notify RN       Physical Exam  Constitutional: He is oriented to person, place, and time. He appears well-developed and well-nourished.  HENT:  Head: Normocephalic and atraumatic.  Right Ear: External ear normal.  Left Ear: External ear normal.  Nose: Nose normal.  Mouth/Throat: Oropharynx is clear and moist.  Eyes: Conjunctivae and EOM are normal. Pupils are equal, round, and reactive to light.    Neurological: He is alert and oriented to person, place, and time. A cranial nerve deficit (nystagmus to right lateral field) is present. Coordination abnormal.  Follows basic 1 and 2 step commands without difficulty. Speech soft and raspy (not baseline). Slight dy.  Voice is hoarse no dysarthria Skin: Skin is warm and dry.  Psychiatric: He has a flat mood and affect. His behavior is normal. Judgment and thought content normal  Assessment/Plan: 1. 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 Physiatrist is providing close team supervision and 24 hour management of active medical problems listed below. Physiatrist and rehab team continue to assess barriers to discharge/monitor patient progress toward functional and medical goals. FIM: FIM - Bathing Bathing Steps Patient Completed: Chest;Right Arm;Left Arm;Abdomen;Front perineal area;Buttocks;Right upper leg;Left upper leg;Right lower leg (including foot);Left lower leg (including foot) Bathing: 4: Steadying assist  FIM - Upper Body Dressing/Undressing Upper body dressing/undressing steps patient completed: Thread/unthread right sleeve of pullover shirt/dresss;Pull shirt over trunk;Put head through opening of pull over shirt/dress;Thread/unthread left sleeve of pullover shirt/dress Upper body dressing/undressing: 5: Set-up assist to: Obtain clothing/put away FIM - Lower Body Dressing/Undressing Lower body dressing/undressing steps patient completed: Thread/unthread right underwear leg;Thread/unthread left underwear leg;Pull underwear up/down;Thread/unthread right pants leg;Thread/unthread left pants leg;Pull pants up/down;Fasten/unfasten pants;Don/Doff right sock;Don/Doff left sock;Don/Doff right shoe;Don/Doff left shoe;Fasten/unfasten right shoe;Fasten/unfasten left shoe Lower body dressing/undressing: 4: Steadying Assist  FIM - Toileting Toileting steps completed by patient: Adjust clothing prior to toileting;Performs perineal hygiene;Adjust clothing after toileting Toileting Assistive Devices: Grab bar or rail for support Toileting: 4: Steadying assist  FIM - Diplomatic Services operational officer Devices: Grab bars Toilet Transfers: 4-To toilet/BSC: Min A (steadying Pt. > 75%);4-From toilet/BSC: Min A (steadying Pt. > 75%)  FIM - Bed/Chair Transfer Bed/Chair Transfer Assistive Devices:  (squat  pivot) Bed/Chair Transfer: 5: Supine > Sit: Supervision (verbal cues/safety issues);4: Bed > Chair or W/C: Min A (steadying Pt. > 75%)  FIM -  Locomotion: Wheelchair Distance: 150' Locomotion: Wheelchair: 6: Travels 150 ft or more, turns around, maneuvers to table, bed or toilet, negotiates 3% grade: maneuvers on rugs and over door sills independently FIM - Locomotion: Ambulation Locomotion: Ambulation Assistive Devices: Designer, industrial/product Ambulation/Gait Assistance: 3: Mod assist Locomotion: Ambulation: 2: Travels 50 - 149 ft with moderate assistance (Pt: 50 - 74%)  Comprehension Comprehension Mode: Auditory Comprehension: 5-Understands complex 90% of the time/Cues < 10% of the time  Expression Expression Mode: Verbal Expression: 5-Expresses basic needs/ideas: With no assist  Social Interaction Social Interaction: 7-Interacts appropriately with others - No medications needed.  Problem Solving Problem Solving: 5-Solves basic 90% of the time/requires cueing < 10% of the time  Memory Memory: 7-Complete Independence: No helper    Medical Problem List and Plan:  1. DVT Prophylaxis/Anticoagulation: Lovenox   2. Pain Management: N/A   3. Mood: Motivated. Mood fairly upbeat at present. No signs of post CVA depression  4. HTN: controlled.  Started on lisinopril and norvascon 04/18.Marland Kitchen    Systolic spikes Qd.  Norvasc increased to 10mg , will increase lisinopril as well.   5. Dysphagia: Continue D3 diet with nectar liquids and full supervision to prevent aspiration. D/C IVF 2 days ago Monitor intake and check lytes for hydration status.  Intake a little better overall. Continue to monitor closely  6. Impaired Fasting Glucose:  cbg's have been normal.  7. B 12 deficiency: IM injection last done 4/19  LOS (Days) 14 A FACE TO FACE EVALUATION WAS PERFORMED  Giovanne Nickolson E 01/23/2012, 11:53 AM

## 2012-01-23 NOTE — Progress Notes (Signed)
Speech Language Pathology Daily Session Note  Patient Details  Name: Shane Juarez MRN: [id number] Date of Birth: [birth year]  Today's Date: 01/23/2012 Time: 1130-1155 Time Calculation (min): 25 min  Short Term Goals: Week 2: SLP Short Term Goal 1 (Week 2): Patient will consume Dys. 3 and thin liquids without observed s/s of aspiraiton and supervision semantic cues to utilize strategies SLP Short Term Goal 2 (Week 2): Patient will consume trials of regualr textures without observed s/s of aspiration  Skilled Therapeutic Interventions: Group session focused on skilled treatment of dysphagia; Patient was modified independent consuming regular textures and thin liquids until end of session when patient required supervision semantic cues to consume small, single sips of thin liquid. Educated wife and patient regarding goals of [employer].    Daily Session Precautions/Restrictions  Precautions Precautions: Fall Restrictions Weight Bearing Restrictions: No Pain Pain Assessment Pain Assessment: No/denies pain Pain Score: 0-No pain  Therapy/Group: Group Therapy  Charlane Ferretti., CCC-SLP [phone]  Shane Juarez 01/23/2012, 1:12 PM

## 2012-01-23 NOTE — Patient Care Conference (Signed)
Inpatient RehabilitationTeam Conference Note Date: 01/23/2012   Time: 11:25 AM    Patient Name: Shane Juarez      Medical Record Number: [id number]  Date of Birth: [birth year] Sex: Male         Room/Bed: 4151/4151-01 Payor Info: Payor: MEDICARE  Plan: MEDICARE PART A AND B  Product Type: *No Product type*     Admitting Diagnosis: RT CVA  Admit Date/Time:  01/09/2012  2:57 PM Admission Comments: No comment available   Primary Diagnosis:  CVA (cerebral vascular accident) Principal Problem: CVA (cerebral vascular accident)  Patient Active Problem List  Diagnoses Date Noted  . CVA (cerebral vascular accident) 01/06/2012  . HTN (hypertension) 01/06/2012    Expected Discharge Date: Expected Discharge Date: 01/30/12  Team Members Present: Physician: Dr. Claudette Laws Case Manager Present: Lutricia Horsfall, RN Social Worker Present: Dossie Der, LCSW Nurse Present: Rosalio Macadamia, RN PT Present: Wanda Plump, Varney Biles, PT OT Present: Bretta Bang, OT SLP Present: Fae Pippin, SLP Tora Duck, RN, PPS Coordinator     Current Status/Progress Goal Weekly Team Focus  Medical   poor po intake, elevated BP  encourage Po intake  Monitor  I/O and BMET   Bowel/Bladder   Continent of bowel and bladder  Continent of bowel and bladder  Monitor   Swallow/Nutrition/ Hydration   regular textures and thin liquids with full supervision   Mod I   trials of thin with a straw and medication with liquid   ADL's   Pt currently min assist for LB selfcare and mod assist for toileting and toilet transfers.  Still with increased lean to the right side in standing, especiallly with dynamic balance tasks.  Also loses balance forward and backwards with transfers  goals still supervision level overall  selfcare retraining to reach supervision, dynamic standing balance for ADLS, pt/family education   Mobility   min to mod assist gait with rw decreased consistency, supervision squat transfers, min  assist steps with 2 rails  min assist gait and steps supervision transfers  gait and family education   Communication             Safety/Cognition/ Behavioral Observations            Pain   No c/o pain  <3  Monitor   Skin   CDI  CDI  Routine turn q 2hrs      *See Interdisciplinary Assessment and Plan and progress notes for long and short-term goals  Barriers to Discharge: see above    Possible Resolutions to Barriers:  see above    Discharge Planning/Teaching Needs:  Home with wife who is here daily participating in his care.      Team Discussion:  Poor fluid intake. Pt does not initiate drinking, needs encouragement. Pt continues to be high fall risk towards right side. Mobility goals for home are w/c level. Wife in for therapies. Educate on safety issues.  Revisions to Treatment Plan:     Continued Need for Acute Rehabilitation Level of Care: The patient requires daily medical management by a physician with specialized training in physical medicine and rehabilitation for the following conditions: Daily direction of a multidisciplinary physical rehabilitation program to ensure safe treatment while eliciting the highest outcome that is of practical value to the patient.: Yes Daily medical management of patient stability for increased activity during participation in an intensive rehabilitation regime.: Yes Daily analysis of laboratory values and/or radiology reports with any subsequent need for medication adjustment of medical intervention  for : Neurological problems  Shane Juarez 01/23/2012, 6:33 PM

## 2012-01-23 NOTE — Progress Notes (Signed)
Physical Therapy Note  Patient Details  Name: Shane Juarez MRN: [id number] Date of Birth: April 11, 1930 Today's Date: 01/23/2012  10:00 11:00 individual therapy pt denied pain   Performed squat pivot with question cues for wc set up and safety. Pt demonstrates poor organization with set up and poor carryover with learned techniques. Pt was able to transfer with control despite poor set up. Kinetron performed in standing with mirror for visual feedback to work on weight shift to left with varying hand use 1-2. Pt was unable to shift to left without 1 UE support without facilitation. Gait training home environment mod assist with vc for walker placement and rt foot abduction x 40', controlled gait with mod assist 150' with theraband used for tactile cue for hip extension and maintenance of walker placement.  [name] 01/23/2012, 12:20 PM

## 2012-01-24 DIAGNOSIS — Z5189 Encounter for other specified aftercare: Secondary | ICD-10-CM

## 2012-01-24 DIAGNOSIS — G8110 Spastic hemiplegia affecting unspecified side: Secondary | ICD-10-CM

## 2012-01-24 DIAGNOSIS — I6330 Cerebral infarction due to thrombosis of unspecified cerebral artery: Secondary | ICD-10-CM

## 2012-01-24 LAB — BASIC METABOLIC PANEL
BUN: 12 mg/dL (ref 6–23)
CO2: 23 mEq/L (ref 19–32)
Calcium: 8.6 mg/dL (ref 8.4–10.5)
Chloride: 104 mEq/L (ref 96–112)
Creatinine, Ser: 1.12 mg/dL (ref 0.50–1.35)
GFR calc Af Amer: 70 mL/min — ABNORMAL LOW (ref >90)
GFR calc non Af Amer: 60 mL/min — ABNORMAL LOW (ref >90)
Glucose, Bld: 134 mg/dL — ABNORMAL HIGH (ref 70–99)
Potassium: 3.8 mEq/L (ref 3.5–5.1)
Sodium: 139 mEq/L (ref 135–145)

## 2012-01-24 NOTE — Progress Notes (Signed)
Patient ID: Shane Juarez, male   DOB: 05-29-1930, 78 y.o.   MRN: [id number] Subjective/Complaints:\ No c/os.  Controlling R arm better  Review of Systems  Constitutional: Positive for malaise/fatigue.  HENT: Negative.   Gastrointestinal: Negative for abdominal pain and constipation.  Genitourinary: Negative for dysuria and urgency.  Musculoskeletal: Negative for myalgias.  Skin: Negative.   Neurological: Positive for dizziness.  Psychiatric/Behavioral: Negative for depression.               Objective: Vital Signs: Blood pressure 144/73, pulse 79, temperature 98.4 F (36.9 C), temperature source Oral, resp. rate 19, height 5\' 9"  (1.753 m), weight 82.2 kg (181 lb 3.5 oz), SpO2 97.00%. No results found. Results for orders placed during the hospital encounter of 01/09/12 (from the past 72 hour(s))  BASIC METABOLIC PANEL     Status: Abnormal   Collection Time   01/24/12  6:33 AM      Component Value Range Comment   Sodium 139  135 - 145 (mEq/L)    Potassium 3.8  3.5 - 5.1 (mEq/L)    Chloride 104  96 - 112 (mEq/L)    CO2 23  19 - 32 (mEq/L)    Glucose, Bld 134 (*) 70 - 99 (mg/dL)    BUN 12  6 - 23 (mg/dL)    Creatinine, Ser 1.30  0.50 - 1.35 (mg/dL)    Calcium 8.6  8.4 - 10.5 (mg/dL)    GFR calc non Af Amer 60 (*) >90 (mL/min)    GFR calc Af Amer 70 (*) >90 (mL/min)     Physical Exam  Constitutional: He is oriented to person, place, and time. He appears well-developed and well-nourished.  HENT:  Head: Normocephalic and atraumatic.  Right Ear: External ear normal.  Left Ear: External ear normal.  Nose: Nose normal.  Mouth/Throat: Oropharynx is clear and moist.  Eyes: Conjunctivae and EOM are normal. Pupils are equal, round, and reactive to light.   Lungs Clear Cor RRR no murmur Abd +BS soft Neurological: He is alert and oriented to person, place, and time. A cranial nerve deficit (nystagmus to right lateral field) is present. Coordination abnormal.  Follows basic 1 and 2 step  commands without difficulty. Speech soft and raspy (not baseline). Slight dy. Mild ataxia R F/N/F Voice is hoarse no dysarthria Skin: Skin is warm and dry.  Psychiatric: He has a flat mood and affect. His behavior is normal. Judgment and thought content normal  Assessment/Plan: 1. 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 Physiatrist is providing close team supervision and 24 hour management of active medical problems listed below. Physiatrist and rehab team continue to assess barriers to discharge/monitor patient progress toward functional and medical goals. FIM: FIM - Bathing Bathing Steps Patient Completed: Chest;Right Arm;Left Arm;Abdomen;Front perineal area;Buttocks;Right upper leg;Left upper leg;Right lower leg (including foot);Left lower leg (including foot) Bathing: 4: Steadying assist  FIM - Upper Body Dressing/Undressing Upper body dressing/undressing steps patient completed: Thread/unthread right sleeve of pullover shirt/dresss;Pull shirt over trunk;Put head through opening of pull over shirt/dress;Thread/unthread left sleeve of pullover shirt/dress Upper body dressing/undressing: 5: Set-up assist to: Obtain clothing/put away FIM - Lower Body Dressing/Undressing Lower body dressing/undressing steps patient completed: Thread/unthread right underwear leg;Thread/unthread left underwear leg;Pull underwear up/down;Thread/unthread right pants leg;Thread/unthread left pants leg;Pull pants up/down;Fasten/unfasten pants;Don/Doff right sock;Don/Doff left sock;Don/Doff right shoe;Don/Doff left shoe;Fasten/unfasten right shoe;Fasten/unfasten left shoe Lower body dressing/undressing:  4: Steadying Assist  FIM - Toileting Toileting steps completed by patient: Adjust clothing prior to toileting;Performs perineal hygiene;Adjust clothing after toileting Toileting  Assistive Devices: Grab bar or rail for support Toileting: 4: Steadying assist  FIM - Diplomatic Services operational officer Devices: Grab bars Toilet Transfers: 4-To toilet/BSC: Min A (steadying Pt. > 75%);4-From toilet/BSC: Min A (steadying Pt. > 75%)  FIM - Bed/Chair Transfer Bed/Chair Transfer Assistive Devices:  (squat pivot) Bed/Chair Transfer: 5: Bed > Chair or W/C: Supervision (verbal cues/safety issues);5: Chair or W/C > Bed: Supervision (verbal cues/safety issues)  FIM - Locomotion: Wheelchair Distance: 150' Locomotion: Wheelchair: 5: Travels 150 ft or more: maneuvers on rugs and over door sills with supervision, cueing or coaxing FIM - Locomotion: Ambulation Locomotion: Ambulation Assistive Devices: Designer, industrial/product Ambulation/Gait Assistance: 3: Mod assist Locomotion: Ambulation: 3: Travels 150 ft or more with moderate assistance (Pt: 50 - 74%)  Comprehension Comprehension Mode: Auditory Comprehension: 5-Understands basic 90% of the time/requires cueing < 10% of the time  Expression Expression Mode: Verbal Expression: 5-Expresses basic needs/ideas: With no assist  Social Interaction Social Interaction: 6-Interacts appropriately with others with medication or extra time (anti-anxiety, antidepressant).  Problem Solving Problem Solving: 5-Solves basic 90% of the time/requires cueing < 10% of the time  Memory Memory: 6-More than reasonable amt of time    Medical Problem List and Plan:  1. DVT Prophylaxis/Anticoagulation: Lovenox   2. Pain Management: N/A   3. Mood: Motivated. Mood fairly upbeat at present. No signs of post CVA depression  4. HTN: controlled.  Started on lisinopril and norvascon 04/18.Marland Kitchen    Systolic spikes Qd.  Norvasc increased to 10mg , will increase lisinopril as well.   5. Dysphagia: Continue D3 diet with nectar liquids and full supervision to prevent aspiration. D/C IVF 2 days ago Monitor intake and check lytes for hydration status.   Intake a little better overall. Continue to monitor closely  6. Impaired Fasting Glucose:  cbg's have been normal.  7. B 12 deficiency: IM injection last done 4/19  LOS (Days) 15 A FACE TO FACE EVALUATION WAS PERFORMED  Lehua Flores E 01/24/2012, 8:26 AM

## 2012-01-24 NOTE — Progress Notes (Signed)
Physical Therapy Session Note  Patient Details  Name: Shane Juarez MRN: [id number] Date of Birth: 12-18-1929  Today's Date: 01/24/2012 Time: 1302-1401 Time Calculation (min): 59 min  Short Term Goals: Week 1:  PT Short Term Goal 1 (Week 1): Pt will self correct LOB to R during dynamic sitting activity without A except cues PT Short Term Goal 1 - Progress (Week 1): Met PT Short Term Goal 2 (Week 1): Pt will stand during functional activity with 1 UE assist with <=mod A for 5+ minutes PT Short Term Goal 2 - Progress (Week 1): Met PT Short Term Goal 3 (Week 1): Pt will gait 50 ft with mod A LRAD PT Short Term Goal 3 - Progress (Week 1): Met PT Short Term Goal 4 (Week 1): Pt will propel w/c mod I on unit, >150 ft PT Short Term Goal 4 - Progress (Week 1): Met PT Short Term Goal 5 (Week 1): Pt will transfer stand pivot sit 2/3 trials with min A PT Short Term Goal 5 - Progress (Week 1): Met  Skilled Therapeutic Interventions/Progress Updates:   Gait and dynamic balance training focusing pt relearning true midline for balance.     Therapy Documentation Precautions:  Precautions Precautions: Fall Precaution Comments: water protocol  Restrictions Weight Bearing Restrictions: No Other Position/Activity Restrictions: Severe fall and lean to the right side with decreased awareness to fix it. Pain: Pain Assessment Pain Assessment: No/denies pain Pain Score: 0-No pain Mobility:  Sit to stand with min@ Locomotion :  Gait training on unit with RW and theraband x 160' with mod@, stopping multiple times to have pt correct his balance, falling to the right.  Pt reports that he knows he is falling but cannot stop himself.  Discussed strategies for trying to shift weight back to the left to prevent fall.  Balance:  Kinetron used for dynamic standing balance set on 10 cm2.  Focus on keeping the pedals even and maintaining balance at midline while performing reaching activity to the left and then  trying to toss a ball with both hands.  Pt showing increase time between LOB with repetitive trials of both activities.  Note pt would hold body stiffly to maintain balance and would not move his head or eyes to follow where he would place or reach for objects, if he did move his head or eyes he would not be able to maintain his balance.  Exercise: Kinetron in standing on 50 cm2 x 41/2 min. For strengthening and coordination of LEs.  See FIM for current functional status  Therapy/Group: Individual Therapy  Georges Mouse 01/24/2012, 4:05 PM

## 2012-01-24 NOTE — Progress Notes (Addendum)
Occupational Therapy Session Note  Patient Details  Name: Shane Juarez MRN: [id number] Date of Birth: [birth year]  Today's Date: 01/24/2012 Time: 12:00-1215 Time Calculation (min): 15 min  Skilled Therapeutic Interventions/Progress Updates:    Pt seen today in [employer] with focus on bilateral use/functional use UE's for self feeding, tray set-up, opening containers. [name] was present and was noted to cut pt's food for him. He benefits from occasional reminders/cues to clear throat/cough and for taking small bites/sips. Pt is able to state these precautions with prompting noted.   Therapy Documentation Precautions:  Precautions Precautions: Fall Precaution Comments: water protocol  Restrictions Weight Bearing Restrictions: No Other Position/Activity Restrictions: Severe fall and lean to the right side with decreased awareness to fix it.     Pain: No/denies pain    See FIM for current functional status  Therapy/Group: Group Therapy  Charletta Cousin, Betta Balla Beth Dixon 01/24/2012, 1:23 PM

## 2012-01-24 NOTE — Progress Notes (Signed)
Speech Language Pathology Daily Session Note  Patient Details  Name: Shane Juarez MRN: [id number] Date of Birth: [birth year]  Today's Date: 01/24/2012 Time: 1130-1200 Time Calculation (min): 30 min  Short Term Goals: Week 2: SLP Short Term Goal 1 (Week 2): Patient will consume Dys. 3 and thin liquids without observed s/s of aspiraiton and supervision semantic cues to utilize strategies SLP Short Term Goal 2 (Week 2): Patient will consume trials of regualr textures without observed s/s of aspiration  Skilled Therapeutic Interventions: Session focused on skilled treatment of dysphagia in [employer] group.  Wife present and assisted patient with meal set up; Wife and SLP provided supervision level semantic cues to consume small bites and sips; patient independently utilized 1-2 swallows per bite/sip and appeared to adjust as needed.  Patient demonstrated cough x1 suspected to be due to large cup sips; however, hard effortful cough appeared to remove suspected penetrates.  During discussion regarding progress and discharge planning patient admitted that he was doing so well because we were "watching him."  Recommend trials without full supervision with SLP to assess upgrade.   Daily Session FIM:  Comprehension Comprehension Mode: Auditory Comprehension: 5-Understands basic 90% of the time/requires cueing < 10% of the time Expression Expression Mode: Verbal Expression: 5-Expresses basic needs/ideas: With no assist Social Interaction Social Interaction: 6-Interacts appropriately with others with medication or extra time (anti-anxiety, antidepressant). Problem Solving Problem Solving: 5-Solves basic 90% of the time/requires cueing < 10% of the time Memory Memory: 6-More than reasonable amt of time FIM - Eating Eating Activity: 5: Supervision/cues;5: Set-up assist for cut food ([name] cut pt's food) General    Pain Pain Assessment Pain Assessment: No/denies pain Pain Score: 0-No  pain  Therapy/Group: Group Therapy  Shane Juarez, M.A., CCC-SLP [phone]  [name] 01/24/2012, 1:23 PM

## 2012-01-24 NOTE — Progress Notes (Signed)
Occupational Therapy Session Note  Patient Details  Name: Shane Juarez MRN: [id number] Date of Birth: Sep 17, 1930  Today's Date: 01/24/2012 Time: 4098-1191 Time Calculation (min): 57 min  Short Term Goals: Week 3:  OT Short Term Goal 1 (Week 3): Pt will perform shower transfers with min assist for walk-in shower. OT Short Term Goal 2 (Week 3): Pt will perform LB bathing with supervision 3 consecutive sessions. OT Short Term Goal 3 (Week 3): Pt will perform LB dressing with supervision sit to stand. OT Short Term Goal 4 (Week 3): Pt will perfrom toilet transfer with supervision to 3:1.  Skilled Therapeutic Interventions/Progress Updates:    Worked on bathing and dressing sit to stand.  Used walk-in shower with seat for bathing.  Pt mod facilitation with RW for transfer to and from the shower.  Still with significant fall to the right in standing as well as forward.  Keeps the walker too far out in front of him resulting in LOB anteriorly.  Worked on dynamic standing balance at high/low table. With emphasis on left side weight shift in standing.  Therapy Documentation Precautions:  Precautions Precautions: Fall Precaution Comments: water protocol  Restrictions Weight Bearing Restrictions: No Other Position/Activity Restrictions: Severe fall and lean to the right side with decreased awareness to fix it. Pain: Pain Assessment Pain Assessment: No/denies pain Pain Score: 0-No pain ADL: See FIM for current functional status  Therapy/Group: Individual Therapy  [name] 01/24/2012, 12:21 PM

## 2012-01-24 NOTE — Progress Notes (Signed)
Speech Language Pathology Daily Session Note  Patient Details  Name: Shane Juarez MRN: [id number] Date of Birth: 07/12/1930  Today's Date: 01/24/2012 Time: 1430-1500 Time Calculation (min): 30 min  Short Term Goals: Week 2:  SLP Short Term Goal 1 (Week 2): Patient will consume Dys. 3 and thin liquids without observed s/s of aspiraiton and supervision semantic cues to utilize strategies SLP Short Term Goal 2 (Week 2): Patient will consume trials of regualr textures without observed s/s of aspiration  Skilled Therapeutic Interventions: Treatment focused on review and functional utilization of swallow compensatory strategies with complete independence with use 1-2 extra swallows.  Patient able to verbalize the purpose of this strategy and did need minimal clarification cues as to why he used to be on thickened liquids due to possible confusion at that time.  FIM:  Comprehension Comprehension Mode: Auditory Comprehension: 6-Follows complex conversation/direction: With extra time/assistive device Expression Expression Mode: Verbal Expression: 6-Expresses complex ideas: With extra time/assistive device Social Interaction Social Interaction: 6-Interacts appropriately with others with medication or extra time (anti-anxiety, antidepressant). Problem Solving Problem Solving: 5-Solves complex 90% of the time/cues < 10% of the time Memory Memory: 6-More than reasonable amt of time FIM - Eating Eating Activity: 5: Supervision/cues   Pain Pain Assessment Pain Assessment: No/denies pain Pain Score: 0-No pain  Therapy/Group: Individual Therapy  Myra Rude, M.S.,CCC-SLP Pager [phone] 01/24/2012, 3:13 PM

## 2012-01-25 NOTE — Progress Notes (Signed)
Social Work Patient ID: Shane Juarez, male   DOB: Feb 03, 1930, 78 y.o.   MRN: [id number] Met with pt and wife to discuss discharge plans.  Wife concerned about being able to provide pt's care at home. Discussed the high risk to fall at home and that he may try to walk and move more at home than he does here.  Wife is Concerned about this also.  Discussed short term NHP and also hired assist.  They will discuss together and get back with  Worker.  Wife continues to attend and participate in therapies with pt.

## 2012-01-25 NOTE — Progress Notes (Signed)
Speech Language Pathology Daily Session Note & Discharge Summary  Patient Details  Name: Shane Juarez MRN: [id number] Date of Birth: [birth year]  Today's Date: 01/25/2012 Time: 0835-0900 Time Calculation (min): 25 min  Short Term Goals: Week 2: SLP Short Term Goal 1 (Week 2): Patient will consume Dys. 3 and thin liquids without observed s/s of aspiraiton and supervision semantic cues to utilize strategies SLP Short Term Goal 2 (Week 2): Patient will consume trials of regualr textures without observed s/s of aspiration  Skilled Therapeutic Interventions: Session focused on educating and making discharge recommendations to patient (wife not present) with discharging consuming regular textures and thin liquids with intermittent supervision cues to utilize small bites and sips with no straws and an intermittent cough as a precaution; medication whole in puree.  Patient consumed thin liquids via cup with cough x1; however patient able to verbalize that it happened because he took to big a sip.  Of note, after session ended SLP completed education with wife.   Daily Session Precautions/Restrictions    FIM:  Comprehension Comprehension Mode: Auditory Comprehension: 5-Follows basic conversation/direction: With extra time/assistive device Expression Expression Mode: Verbal Expression: 5-Expresses basic needs/ideas: With no assist Social Interaction Social Interaction: 6-Interacts appropriately with others with medication or extra time (anti-anxiety, antidepressant). Problem Solving Problem Solving: 5-Solves basic 90% of the time/requires cueing < 10% of the time Memory Memory: 6-More than reasonable amt of time FIM - Eating Eating Activity: 6: More than reasonable amount of time General    Pain Pain Assessment Pain Assessment: No/denies pain Pain Score: 0-No pain  Therapy/Group: Individual Therapy    Speech Language Pathology Discharge Summary  Patient Details  Name: [name] MRN: [id number] Date of Birth: 01-09-1930  Today's Date: 01/25/2012  Patient has met 4 of 4 long term goals due to gains in swallow function.  Patient to discharge at overall Supervision level.  Patient's care partner is independent to provide the necessary dysphagia assistance at discharge.  Reasons goals not met: n/a  Recommendation:  Patient requires no SLP follow up post CIR discharge.  SLP recommends upgrade to pills whole with liquids in 1-2 weeks and if cough present hold off and continue whole in puree.  Equipment: none  Reasons for discharge: treatment goals met  Patient/family agrees with progress made and goals achieved: Yes  See FIM for current functional status  Charlane Ferretti., CCC-SLP [phone]  Alrick Cubbage 01/25/2012, 8:46 AM

## 2012-01-25 NOTE — Progress Notes (Signed)
Physical Therapy Note  Patient Details  Name: MAKELL CYR MRN: [id number] Date of Birth: July 18, 1930 Today's Date: 01/25/2012  1000-1045 (45 minutes) individual  Pain: no complaint of pain Focus of treatment: Therapeutic activities to facilitate weight shift to right in standing/gait Treatment: NuStep x 5 minutes Level 5 LEs only for general strengthening LEs; sit to stand from wc close SBA with narrow BOS and intermittent lean to right; static standing SBA; standing with RT LE on step with vcs or manual assist for adequate weight shift to right X 2 for 2 minutes; up/down step LT LE X 10 with bilateral UE support for quad strengthening; gait: 30 feet X 2 mod assist to max assist for balance with vcs to maintain abducted RT LE (hip width BOS) /decreased step length bilaterally. ; wc mobility SBA unit level surfaces 150 feet.  HOUT,JIM 01/25/2012, 10:51 AM

## 2012-01-25 NOTE — Discharge Instructions (Signed)
Inpatient Rehab Discharge Instructions  Shane Juarez Discharge date and time:    Activities/Precautions/ Functional Status: Activity: {discharge activity:18261} Diet: diabetic diet Wound Care: none needed Functional status:  ___ No restrictions     ___ Walk up steps independently ___ 24/7 supervision/assistance   ___ Walk up steps with assistance ___ Intermittent supervision/assistance  ___ Bathe/dress independently ___ Walk with walker     ___ Bathe/dress with assistance ___ Walk Independently    ___ Shower independently ___ Walk with assistance    ___ Shower with assistance ___ No alcohol     ___ Return to work/school ________  Special Instructions: STROKE/TIA DISCHARGE INSTRUCTIONS SMOKING Cigarette smoking nearly doubles your risk of having a stroke & is the single most alterable risk factor  If you smoke or have smoked in the last 12 months, you are advised to quit smoking for your health.  Most of the excess cardiovascular risk related to smoking disappears within a year of stopping.  Ask you doctor about anti-smoking medications  [place] Quit Line: 1-800-QUIT NOW  Free Smoking Cessation Classes ([phone]  CHOLESTEROL Know your levels; limit fat & cholesterol in your diet  Lab Results  Component Value Date   CHOL 136 01/07/2012   HDL 63 01/07/2012   LDLCALC 58 01/07/2012   TRIG 75 01/07/2012   CHOLHDL 2.2 01/07/2012      Many patients benefit from treatment even if their cholesterol is at goal.  Goal: Total Cholesterol less than 160  Goal:  LDL less than 100  Goal:  HDL greater than 40  Goal:  Triglycerides less than 150  BLOOD PRESSURE American Stroke Association blood pressure target is less that 120/80 mm/Hg  Your discharge blood pressure is:  BP: 175/75 mmHg  Monitor your blood pressure  Limit your salt and alcohol intake  Many individuals will require more than one medication for high blood pressure  DIABETES (A1c is a blood sugar average for last 3  months) Goal A1c is under 7% (A1c is blood sugar average for last 3 months)  Diabetes: {STROKE DC DIABETES:22357}    Lab Results  Component Value Date   HGBA1C 6.3* 01/07/2012    Your A1c can be lowered with medications, healthy diet, and exercise.  Check your blood sugar as directed by your physician  Call your physician if you experience unexplained or low blood sugars.  PHYSICAL ACTIVITY/REHABILITATION Goal is 30 minutes at least 4 days per week    Activity decreases your risk of heart attack and stroke and makes your heart stronger.  It helps control your weight and blood pressure; helps you relax and can improve your mood.  Participate in a regular exercise program.  Talk with your doctor about the best form of exercise for you (dancing, walking, swimming, cycling).  DIET/WEIGHT Goal is to maintain a healthy weight  Your height is:  Height: 5\' 9"  (175.3 cm) Your current weight is: Weight: 85.6 kg (188 lb 11.4 oz) Your body Mass Index (BMI) is:  BMI (Calculated): 27.9   Following the type of diet specifically designed for you will help prevent another stroke.  Your goal Body Mass Index (BMI) is 19-24.  Healthy food habits can help reduce 3 risk factors for stroke:  High cholesterol, hypertension, and excess weight.        My questions have been answered and I understand these instructions. I will adhere to these goals and the provided educational materials after my discharge from the hospital.  Patient/Caregiver Signature _______________________________  Date __________  Clinician Signature _______________________________________ Date __________  Please bring this form and your medication list with you to all your follow-up doctor's appointments.

## 2012-01-25 NOTE — Progress Notes (Signed)
Patient ID: Shane Juarez, male   DOB: 1929/12/10, 78 y.o.   MRN: [id number] Subjective/Complaints:\ No c/os.  Controlling R arm better  Review of Systems  Constitutional: Positive for malaise/fatigue.  HENT: Negative.   Gastrointestinal: Negative for abdominal pain and constipation.  Genitourinary: Negative for dysuria and urgency.  Musculoskeletal: Negative for myalgias.  Skin: Negative.   Neurological: Positive for dizziness.  Psychiatric/Behavioral: Negative for depression.               Objective: Vital Signs: Blood pressure 117/64, pulse 82, temperature 98.6 F (37 C), temperature source Oral, resp. rate 18, height 5\' 9"  (1.753 m), weight 82.2 kg (181 lb 3.5 oz), SpO2 98.00%. No results found. Results for orders placed during the hospital encounter of 01/09/12 (from the past 72 hour(s))  BASIC METABOLIC PANEL     Status: Abnormal   Collection Time   01/24/12  6:33 AM      Component Value Range Comment   Sodium 139  135 - 145 (mEq/L)    Potassium 3.8  3.5 - 5.1 (mEq/L)    Chloride 104  96 - 112 (mEq/L)    CO2 23  19 - 32 (mEq/L)    Glucose, Bld 134 (*) 70 - 99 (mg/dL)    BUN 12  6 - 23 (mg/dL)    Creatinine, Ser 8.29  0.50 - 1.35 (mg/dL)    Calcium 8.6  8.4 - 10.5 (mg/dL)    GFR calc non Af Amer 60 (*) >90 (mL/min)    GFR calc Af Amer 70 (*) >90 (mL/min)     Physical Exam  Constitutional: He is oriented to person, place, and time. He appears well-developed and well-nourished.  HENT:  Head: Normocephalic and atraumatic.  Right Ear: External ear normal.  Left Ear: External ear normal.  Nose: Nose normal.  Mouth/Throat: Oropharynx is clear and moist.  Eyes: Conjunctivae and EOM are normal. Pupils are equal, round, and reactive to light.   Lungs Clear Cor RRR no murmur Abd +BS soft Neurological: He is alert and oriented to person, place, and time. A cranial nerve deficit (nystagmus to right lateral field) is present. Coordination abnormal.  Follows basic 1 and 2 step  commands without difficulty. Speech soft and raspy (not baseline). Slight dy. Mild ataxia R F/N/F Voice is hoarse no dysarthria Skin: Skin is warm and dry.  Psychiatric: He has a flat mood and affect. His behavior is normal. Judgment and thought content normal  Assessment/Plan: 1. 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 Physiatrist is providing close team supervision and 24 hour management of active medical problems listed below. Physiatrist and rehab team continue to assess barriers to discharge/monitor patient progress toward functional and medical goals. FIM: FIM - Bathing Bathing Steps Patient Completed: Chest;Right Arm;Left Arm;Abdomen;Front perineal area;Buttocks;Right upper leg;Left lower leg (including foot);Right lower leg (including foot) Bathing: 4: Steadying assist  FIM - Upper Body Dressing/Undressing Upper body dressing/undressing steps patient completed: Thread/unthread right sleeve of pullover shirt/dresss;Thread/unthread left sleeve of pullover shirt/dress;Put head through opening of pull over shirt/dress;Pull shirt over trunk Upper body dressing/undressing: 5: Supervision: Safety issues/verbal cues FIM - Lower Body Dressing/Undressing Lower body dressing/undressing steps patient completed: Thread/unthread left underwear leg;Thread/unthread right underwear leg;Thread/unthread right pants leg;Thread/unthread left pants leg;Pull underwear up/down;Pull pants up/down;Don/Doff right shoe;Don/Doff left sock;Don/Doff right sock;Fasten/unfasten pants;Don/Doff left shoe;Fasten/unfasten right shoe;Fasten/unfasten left shoe Lower body dressing/undressing: 4: Steadying Assist  FIM - Toileting Toileting steps completed by patient: Adjust clothing prior to toileting;Performs perineal hygiene;Adjust clothing after toileting Toileting Assistive Devices:  Grab bar or rail for support Toileting: 4: Steadying assist  FIM - Diplomatic Services operational officer Devices: Grab bars Toilet Transfers: 4-To toilet/BSC: Min A (steadying Pt. > 75%);4-From toilet/BSC: Min A (steadying Pt. > 75%)  FIM - Bed/Chair Transfer Bed/Chair Transfer Assistive Devices:  (squat pivot) Bed/Chair Transfer: 4: Bed > Chair or W/C: Min A (steadying Pt. > 75%);4: Chair or W/C > Bed: Min A (steadying Pt. > 75%)  FIM - Locomotion: Wheelchair Distance: 150' Locomotion: Wheelchair: 1: Total Assistance/staff pushes wheelchair (Pt<25%) FIM - Locomotion: Ambulation Locomotion: Ambulation Assistive Devices: Designer, industrial/product Ambulation/Gait Assistance: 3: Mod assist Locomotion: Ambulation: 3: Travels 150 ft or more with moderate assistance (Pt: 50 - 74%)  Comprehension Comprehension Mode: Auditory Comprehension: 5-Understands basic 90% of the time/requires cueing < 10% of the time  Expression Expression Mode: Verbal Expression: 5-Expresses basic needs/ideas: With no assist  Social Interaction Social Interaction: 6-Interacts appropriately with others with medication or extra time (anti-anxiety, antidepressant).  Problem Solving Problem Solving: 5-Solves basic 90% of the time/requires cueing < 10% of the time  Memory Memory: 6-More than reasonable amt of time    Medical Problem List and Plan:  1. DVT Prophylaxis/Anticoagulation: Lovenox   2. Pain Management: N/A   3. Mood: Motivated. Mood fairly upbeat at present. No signs of post CVA depression  4. HTN: controlled.  Started on lisinopril and norvascon 04/18.Marland Kitchen    Systolic spikes Qd.  Norvasc increased to 10mg , will increase lisinopril as well.   5. Dysphagia: Continue D3 diet with nectar liquids and full supervision to prevent aspiration. . Continue to monitor closely  6. Impaired Fasting Glucose:  cbg's have been normal.  7. B 12 deficiency: IM injection last done 4/19  LOS (Days) 16 A FACE TO  FACE EVALUATION WAS PERFORMED  Domingos Riggi E 01/25/2012, 8:25 AM

## 2012-01-25 NOTE — Progress Notes (Signed)
Physical Therapy Session Note  Patient Details  Name: Shane Juarez MRN: [id number] Date of Birth: 09-01-1930  Today's Date: 01/25/2012 Time: 2952-8413 Time Calculation (min): 60 min  Therapy Documentation Precautions:  Precautions Precautions: Fall Precaution Comments: water protocol  Restrictions Weight Bearing Restrictions: No Other Position/Activity Restrictions: Severe fall and lean to the right side with decreased awareness to fix it. Pain:no c/o pain  Skilled Therapeutic Interventions/Progress Updates:  Family edu: Car transfer- stand- pivot from w/c, wife present and return demonstrated. NMR: pregait/ standing balance activities- (standing in a corner with RW in front for support) working on balance without support, upper trunk rotation and weight shifting to the right and stepping forward with the right foot, with manual/vcs for body mechanics, supervision-MinA. Gait: with RW- 30ft, 51ft, working on appropriate weight shifts and left foot placement, MinA.  See FIM for current functional status  Therapy/Group: Individual Therapy   Hortencia Conradi, PTA 01/25/2012, 4:10 PM

## 2012-01-25 NOTE — Progress Notes (Signed)
Occupational Therapy Session Note  Patient Details  Name: Shane Juarez MRN: [id number] Date of Birth: 1930-07-30  Today's Date: 01/25/2012 Time: 1000-1045 Time Calculation (min): 45 min  Short Term Goals: Week 3:  OT Short Term Goal 1 (Week 3): Pt will perform shower transfers with min assist for walk-in shower. OT Short Term Goal 2 (Week 3): Pt will perform LB bathing with supervision 3 consecutive sessions. OT Short Term Goal 3 (Week 3): Pt will perform LB dressing with supervision sit to stand. OT Short Term Goal 4 (Week 3): Pt will perfrom toilet transfer with supervision to 3:1.  Skilled Therapeutic Interventions/Progress Updates:    Worked on bathing and dressing session.  Pt transferred to shower seat in the bathroom by ambulating with hand held assist from the EOB and mod facilitation to keep from falling forward or to the right side.  Able to perform bathing with close supervision using the rail for support.  Needs mod assist to transfer to the wheelchair at the sink for dressing tasks.  Stood with min guard assist to shave.  Pt falling forward onto counter with his hips to help him balance.  Transitioned to the dayroom to work on dynamic balance activities.  Had pt work on standing at the table and attempting to balance on his left leg while using only his LUE to stabilize on the table.  Pt needs min to mod facilitation to make weight shift to the left and maintain right foot off of the floor for 5 seconds.  Transitioned to walking around table but having to maintain contact with table on left hip while he walked.  Able to perform with supervision.  Still needs mod facilitation for mobility with or without assistive device.  Therapy Documentation Precautions:  Precautions Precautions: Fall Precaution Comments: water protocol  Restrictions Weight Bearing Restrictions: No Other Position/Activity Restrictions: Severe fall and lean to the right side with decreased awareness to fix  it.  Vital Signs: Therapy Vitals Pulse Rate: 94  Patient Position, if appropriate: Sitting Oxygen Therapy SpO2: 96 % O2 Device: None (Room air) Pain: Pain Assessment Pain Assessment: No/denies pain Pain Score: 0-No pain ADL: See FIM for current functional status  Therapy/Group: Individual Therapy  Velna Hedgecock 01/25/2012, 12:05 PM

## 2012-01-26 NOTE — Progress Notes (Signed)
Occupational Therapy Session Note  Patient Details  Name: Shane Juarez MRN: [id number] Date of Birth: Mar 11, 1930  Today's Date: 01/26/2012 Time: 1400-1500 Time Calculation (min): 60 min  Skilled Therapeutic Interventions/Progress Updates: participated in TAG group with shower bench transfer, UE exercises and static and dynamic balance activities with minimal right leaning     Therapy Documentation Precautions:  Precautions Precautions: Fall Precaution Comments: water protocol  Restrictions Weight Bearing Restrictions: No Other Position/Activity Restrictions: Severe fall and lean to the right side with decreased awareness to fix it.  Pain: no c/os See FIM for current functional status  Therapy/Group: Group Therapy  Rozelle Logan 01/26/2012, 5:20 PM

## 2012-01-26 NOTE — Progress Notes (Signed)
Patient ID: MERL BOMMARITO, male   DOB: Jan 09, 1930, 78 y.o.   MRN: [id number] Subjective/Complaints:\ No c/os.  Controlling R arm better  Review of Systems  Constitutional: Positive for malaise/fatigue.  HENT: Negative.   Gastrointestinal: Negative for abdominal pain and constipation.  Genitourinary: Negative for dysuria and urgency.  Musculoskeletal: Negative for myalgias.  Skin: Negative.   Neurological: Positive for dizziness.  Psychiatric/Behavioral: Negative for depression.               Objective: Vital Signs: Blood pressure 131/77, pulse 74, temperature 98.3 F (36.8 C), temperature source Oral, resp. rate 18, height 5\' 9"  (1.753 m), weight 82.2 kg (181 lb 3.5 oz), SpO2 98.00%. No results found. Results for orders placed during the hospital encounter of 01/09/12 (from the past 72 hour(s))  BASIC METABOLIC PANEL     Status: Abnormal   Collection Time   01/24/12  6:33 AM      Component Value Range Comment   Sodium 139  135 - 145 (mEq/L)    Potassium 3.8  3.5 - 5.1 (mEq/L)    Chloride 104  96 - 112 (mEq/L)    CO2 23  19 - 32 (mEq/L)    Glucose, Bld 134 (*) 70 - 99 (mg/dL)    BUN 12  6 - 23 (mg/dL)    Creatinine, Ser 3.47  0.50 - 1.35 (mg/dL)    Calcium 8.6  8.4 - 10.5 (mg/dL)    GFR calc non Af Amer 60 (*) >90 (mL/min)    GFR calc Af Amer 70 (*) >90 (mL/min)     Physical Exam  Constitutional: He is oriented to person, place, and time. He appears well-developed and well-nourished.  HENT:  Head: Normocephalic and atraumatic.  Right Ear: External ear normal.  Left Ear: External ear normal.  Nose: Nose normal.  Mouth/Throat: Oropharynx is clear and moist.  Eyes: Conjunctivae and EOM are normal. Pupils are equal, round, and reactive to light.   Lungs Clear Cor RRR no murmur Abd +BS soft Neurological: He is alert and oriented to person, place, and time. A cranial nerve deficit (nystagmus to right lateral field) is present. Coordination abnormal.  Follows basic 1 and 2 step  commands without difficulty. Speech soft and raspy (not baseline). Slight dy. Mild ataxia R F/N/F Voice is hoarse no dysarthria Skin: Skin is warm and dry.  Psychiatric: He has a flat mood and affect. His behavior is normal. Judgment and thought content normal  Assessment/Plan: 1. 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 Physiatrist is providing close team supervision and 24 hour management of active medical problems listed below. Physiatrist and rehab team continue to assess barriers to discharge/monitor patient progress toward functional and medical goals. FIM: FIM - Bathing Bathing Steps Patient Completed: Chest;Left upper leg;Right Arm;Left Arm;Right lower leg (including foot);Abdomen;Front perineal area;Buttocks;Left lower leg (including foot);Right upper leg Bathing: 4: Steadying assist  FIM - Upper Body Dressing/Undressing Upper body dressing/undressing steps patient completed: Thread/unthread right sleeve of pullover shirt/dresss;Thread/unthread left sleeve of pullover shirt/dress;Put head through opening of pull over shirt/dress;Pull shirt over trunk Upper body dressing/undressing: 5: Supervision: Safety issues/verbal cues FIM - Lower Body Dressing/Undressing Lower body dressing/undressing steps patient completed: Thread/unthread right underwear leg;Thread/unthread left underwear leg;Pull underwear up/down;Thread/unthread right pants leg;Thread/unthread left pants leg;Pull pants up/down;Fasten/unfasten pants;Don/Doff right sock;Don/Doff left sock;Don/Doff right shoe;Don/Doff left shoe;Fasten/unfasten right shoe;Fasten/unfasten left shoe Lower body dressing/undressing: 4: Steadying  Assist  FIM - Toileting Toileting steps completed by patient: Adjust clothing prior to toileting;Performs perineal hygiene;Adjust clothing after toileting Toileting  Assistive Devices: Grab bar or rail for support Toileting: 4: Steadying assist  FIM - Diplomatic Services operational officer Devices: Grab bars Toilet Transfers: 4-To toilet/BSC: Min A (steadying Pt. > 75%);4-From toilet/BSC: Min A (steadying Pt. > 75%)  FIM - Bed/Chair Transfer Bed/Chair Transfer Assistive Devices:  (squat pivot) Bed/Chair Transfer: 5: Supine > Sit: Supervision (verbal cues/safety issues);4: Bed > Chair or W/C: Min A (steadying Pt. > 75%)  FIM - Locomotion: Wheelchair Distance: 150' Locomotion: Wheelchair: 1: Total Assistance/staff pushes wheelchair (Pt<25%) FIM - Locomotion: Ambulation Locomotion: Ambulation Assistive Devices: Designer, industrial/product Ambulation/Gait Assistance: 3: Mod assist Locomotion: Ambulation: 3: Travels 150 ft or more with moderate assistance (Pt: 50 - 74%)  Comprehension Comprehension Mode: Auditory Comprehension: 5-Follows basic conversation/direction: With extra time/assistive device  Expression Expression Mode: Verbal Expression: 5-Expresses basic needs/ideas: With no assist  Social Interaction Social Interaction: 6-Interacts appropriately with others with medication or extra time (anti-anxiety, antidepressant).  Problem Solving Problem Solving: 5-Solves basic 90% of the time/requires cueing < 10% of the time  Memory Memory: 6-More than reasonable amt of time    Medical Problem List and Plan:  1. DVT Prophylaxis/Anticoagulation: Lovenox   2. Pain Management: N/A   3. Mood: Motivated. Mood fairly upbeat at present. No signs of post CVA depression  4. HTN: controlled.  Started on lisinopril and norvascon 04/18.Marland Kitchen    Systolic spikes Qd.  Norvasc increased to 10mg , will increase lisinopril as well.   5. Dysphagia: Continue D3 diet with nectar liquids and full supervision to prevent aspiration. . Continue to monitor closely  6. Impaired Fasting Glucose:  cbg's have been normal.  7. B 12 deficiency: IM injection last done  4/19  LOS (Days) 17 A FACE TO FACE EVALUATION WAS PERFORMED  KIRSTEINS,ANDREW E 01/26/2012, 10:02 AM

## 2012-01-27 NOTE — Progress Notes (Signed)
Occupational Therapy Session Note  Patient Details  Name: MARZELL ISAKSON MRN: [id number] Date of Birth: 11-Nov-1929  Today's Date: 01/27/2012 Time: 1400-1500 Time Calculation (min): 60 min  Short Term Goals: Week 1:  OT Short Term Goal 1 (Week 1): Patient will perform toilet transfer with min assist OT Short Term Goal 1 - Progress (Week 1): Not met OT Short Term Goal 2 (Week 1): Patient will sit unsupported at edge of bed to aide with lower body dressing (dynamic sitting balance) with min assist OT Short Term Goal 2 - Progress (Week 1): Met OT Short Term Goal 3 (Week 1): Patient will complete 1 of 3 toileting steps (max assist) OT Short Term Goal 3 - Progress (Week 1): Met OT Short Term Goal 4 (Week 1): Patient will shower seated with no greater than min assist. OT Short Term Goal 4 - Progress (Week 1): Not met  Skilled Therapeutic Interventions/Progress Updates:  Patient participated in Therapeutic Activities Group this date with emphasis on patient education, community support, physical equipment, w/c propulsion, self-assessments, and use of electronic gaming system (Wii) to facilitate improved gross/fine motor coordination and dynamic sitting balance.   Patient expressed himself well, required no assist with w/c propulsion, and verbalized satisfaction with session.  See FIM for current functional status  Therapy/Group: Group Therapy  Giulian, Goldring 01/27/2012, 3:51 PM

## 2012-01-27 NOTE — Progress Notes (Signed)
Spoke with patient's wife about fall at 65. "he's no worse is he"? Informed her he started leaning to the right, fell up against recliner then slid to floor. Then she responded with "OK". Tawanna Solo

## 2012-01-27 NOTE — Progress Notes (Signed)
Paged Dr. Wynn Banker about patient losing his balance while transferring going to bathroom. Per report from NT, when turning from patient to turn off bedalarm patient lost his balance fell into recliner then sat on floor. Patient reports "my butt didn't hit the floor". "I'm fine".  Vitals in computer. Patient without complaint of. Will continue to monitor and call wife in AM. Vickki Muff, Alfredo Martinez

## 2012-01-27 NOTE — Progress Notes (Signed)
Patient ID: Shane Juarez, male   DOB: Aug 31, 1930, 78 y.o.   MRN: [id number] Subjective/Complaints:\ Larey Seat last noc on way to [facility] with tech.  He states he fell to the R on to the chair.He denies pain and denies hitting the ground  Review of Systems  Constitutional: Positive for malaise/fatigue.  HENT: Negative.   Gastrointestinal: Negative for abdominal pain and constipation.  Genitourinary: Negative for dysuria and urgency.  Musculoskeletal: Negative for myalgias.  Skin: Negative.   Neurological: Positive for dizziness.  Psychiatric/Behavioral: Negative for depression.               Objective: Vital Signs: Blood pressure 135/79, pulse 78, temperature 98.2 F (36.8 C), temperature source Oral, resp. rate 16, height 5\' 9"  (1.753 m), weight 82.2 kg (181 lb 3.5 oz), SpO2 100.00%. No results found. No results found for this or any previous visit (from the past 72 hour(s)).  Physical Exam  Constitutional: He is oriented to person, place, and time. He appears well-developed and well-nourished.  HENT: neck FROM no pain Head: Normocephalic and atraumatic.  Right Ear: External ear normal.  Left Ear: External ear normal.  Nose: Nose normal.  Mouth/Throat: Oropharynx is clear and moist.  Eyes: Conjunctivae and EOM are normal. Pupils are equal, round, and reactive to light.   Lungs Clear Cor RRR no murmur Abd +BS soft Neurological: He is alert and oriented to person, place, and time. A cranial nerve deficit (nystagmus to right lateral field) is present. Coordination abnormal.  Follows basic 1 and 2 step commands without difficulty. Speech soft and raspy (not baseline). Slight dy. Mild ataxia R F/N/F Voice is hoarse no dysarthria Skin: Skin is warm and dry.  Psychiatric: He has a flat mood and affect. His behavior is normal. Judgment and thought content normal MSK- no pain to palpation or with ROM in BUE and BLEs Assessment/Plan: 1. 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 Physiatrist is providing close team supervision and 24 hour management of active medical problems listed below. Physiatrist and rehab team continue to assess barriers to discharge/monitor patient progress toward functional and medical goals. FIM: FIM - Bathing Bathing Steps Patient Completed: Chest;Left upper leg;Right Arm;Left Arm;Right lower leg (including foot);Abdomen;Front perineal area;Buttocks;Left lower leg (including foot);Right upper leg Bathing: 4: Steadying assist  FIM - Upper Body Dressing/Undressing Upper body dressing/undressing steps patient completed: Thread/unthread right sleeve of pullover shirt/dresss;Thread/unthread left sleeve of pullover shirt/dress;Put head through opening of pull over shirt/dress;Pull shirt over trunk Upper body dressing/undressing: 5: Supervision: Safety issues/verbal cues FIM - Lower Body Dressing/Undressing Lower body dressing/undressing steps patient completed: Thread/unthread right underwear leg;Thread/unthread left underwear leg;Pull underwear up/down;Thread/unthread right pants leg;Thread/unthread left pants leg;Pull pants up/down;Fasten/unfasten pants;Don/Doff right sock;Don/Doff left sock;Don/Doff right shoe;Don/Doff left shoe;Fasten/unfasten right shoe;Fasten/unfasten left shoe Lower body dressing/undressing: 4: Steadying Assist  FIM - Toileting Toileting steps completed by patient: Adjust clothing prior to toileting;Performs perineal hygiene Toileting Assistive Devices: Grab bar or rail for support Toileting: 4: Steadying assist  FIM - Diplomatic Services operational officer Devices: Grab bars;Walker Toilet Transfers: 4-To toilet/BSC: Min A (steadying Pt. > 75%)  FIM - Bed/Chair Transfer Bed/Chair Transfer Assistive Devices:  (squat pivot) Bed/Chair Transfer: 5: Supine > Sit: Supervision (verbal cues/safety issues);4:  Bed > Chair or W/C: Min A (steadying Pt. > 75%)  FIM - Locomotion: Wheelchair Distance: 150' Locomotion: Wheelchair: 1: Total Assistance/staff pushes wheelchair (Pt<25%) FIM -  Locomotion: Ambulation Locomotion: Ambulation Assistive Devices: Designer, industrial/product Ambulation/Gait Assistance: 3: Mod assist Locomotion: Ambulation: 3: Travels 150 ft or more with moderate assistance (Pt: 50 - 74%)  Comprehension Comprehension Mode: Auditory Comprehension: 5-Follows basic conversation/direction: With extra time/assistive device  Expression Expression Mode: Verbal Expression: 5-Expresses basic needs/ideas: With no assist  Social Interaction Social Interaction: 6-Interacts appropriately with others with medication or extra time (anti-anxiety, antidepressant).  Problem Solving Problem Solving: 5-Solves basic 90% of the time/requires cueing < 10% of the time  Memory Memory: 6-More than reasonable amt of time    Medical Problem List and Plan:  1. DVT Prophylaxis/Anticoagulation: Lovenox   2. Pain Management: N/A   3. Mood: Motivated. Mood fairly upbeat at present. No signs of post CVA depression  4. HTN: controlled.  Started on lisinopril and norvascon 04/18.Marland Kitchen    Systolic spikes Qd.  Norvasc increased to 10mg , will increase lisinopril as well.   5. Dysphagia: Continue D3 diet with nectar liquids and full supervision to prevent aspiration. . Continue to monitor closely  6. Impaired Fasting Glucose:  cbg's have been normal.  7. B 12 deficiency: IM injection last done 4/19  LOS (Days) 18 A FACE TO FACE EVALUATION WAS PERFORMED  Kimiye Strathman E 01/27/2012, 7:29 AM

## 2012-01-28 DIAGNOSIS — Z5189 Encounter for other specified aftercare: Secondary | ICD-10-CM

## 2012-01-28 DIAGNOSIS — G8110 Spastic hemiplegia affecting unspecified side: Secondary | ICD-10-CM

## 2012-01-28 DIAGNOSIS — I6330 Cerebral infarction due to thrombosis of unspecified cerebral artery: Secondary | ICD-10-CM

## 2012-01-28 NOTE — Progress Notes (Signed)
Patient ID: Shane Juarez, male   DOB: Sep 13, 1930, 78 y.o.   MRN: [id number] Subjective/Complaints:\ No more falls. No c/os  Review of Systems  Constitutional: Positive for malaise/fatigue.  HENT: Negative.   Gastrointestinal: Negative for abdominal pain and constipation.  Genitourinary: Negative for dysuria and urgency.  Musculoskeletal: Negative for myalgias.  Skin: Negative.   Neurological: Positive for dizziness.  Psychiatric/Behavioral: Negative for depression.               Objective: Vital Signs: Blood pressure 128/74, pulse 75, temperature 98.1 F (36.7 C), temperature source Oral, resp. rate 20, height 5\' 9"  (1.753 m), weight 82.2 kg (181 lb 3.5 oz), SpO2 99.00%. No results found. No results found for this or any previous visit (from the past 72 hour(s)).  Physical Exam  Constitutional: He is oriented to person, place, and time. He appears well-developed and well-nourished.  HENT: neck FROM no pain Head: Normocephalic and atraumatic.  Right Ear: External ear normal.  Left Ear: External ear normal.  Nose: Nose normal.  Mouth/Throat: Oropharynx is clear and moist.  Eyes: Conjunctivae and EOM are normal. Pupils are equal, round, and reactive to light.   Lungs Clear Cor RRR no murmur Abd +BS soft Neurological: He is alert and oriented to person, place, and time. A cranial nerve deficit (nystagmus to right lateral field) is present. Coordination abnormal.  Follows basic 1 and 2 step commands without difficulty. Speech soft and raspy (not baseline). Slight dy. Mild ataxia R F/N/F Voice is hoarse no dysarthria Skin: Skin is warm and dry.  Psychiatric: He has a flat mood and affect. His behavior is normal. Judgment and thought content normal MSK- no pain to palpation or with ROM in BUE and BLEs Assessment/Plan: 1. 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 Physiatrist is providing close team supervision and 24 hour management of active medical problems listed below. Physiatrist and rehab team continue to assess barriers to discharge/monitor patient progress toward functional and medical goals. FIM: FIM - Bathing Bathing Steps Patient Completed: Chest;Left upper leg;Right Arm;Left Arm;Right lower leg (including foot);Abdomen;Front perineal area;Buttocks;Left lower leg (including foot);Right upper leg Bathing: 4: Steadying assist  FIM - Upper Body Dressing/Undressing Upper body dressing/undressing steps patient completed: Thread/unthread right sleeve of pullover shirt/dresss;Thread/unthread left sleeve of pullover shirt/dress;Put head through opening of pull over shirt/dress;Pull shirt over trunk Upper body dressing/undressing: 5: Supervision: Safety issues/verbal cues FIM - Lower Body Dressing/Undressing Lower body dressing/undressing steps patient completed: Thread/unthread right underwear leg;Thread/unthread left underwear leg;Pull underwear up/down;Thread/unthread right pants leg;Thread/unthread left pants leg;Pull pants up/down;Fasten/unfasten pants;Don/Doff right sock;Don/Doff left sock;Don/Doff right shoe;Don/Doff left shoe;Fasten/unfasten right shoe;Fasten/unfasten left shoe Lower body dressing/undressing: 4: Steadying Assist  FIM - Toileting Toileting steps completed by patient: Adjust clothing prior to toileting;Performs perineal hygiene Toileting Assistive Devices: Grab bar or rail for support Toileting: 4: Steadying assist  FIM - Diplomatic Services operational officer Devices: Grab bars;Walker Toilet Transfers: 4-To toilet/BSC: Min A (steadying Pt. > 75%)  FIM - Bed/Chair Transfer Bed/Chair Transfer Assistive Devices:  (squat pivot) Bed/Chair Transfer: 5: Supine > Sit: Supervision (verbal cues/safety issues);4: Bed > Chair or W/C: Min A (steadying Pt. > 75%)  FIM - Locomotion: Wheelchair Distance:  150' Locomotion: Wheelchair: 1: Total Assistance/staff pushes wheelchair (Pt<25%) FIM - Locomotion: Ambulation Locomotion: Ambulation Assistive Devices: Designer, industrial/product Ambulation/Gait Assistance: 3: Mod assist Locomotion: Ambulation: 3: Travels 150 ft or more with  moderate assistance (Pt: 50 - 74%)  Comprehension Comprehension Mode: Auditory Comprehension: 5-Follows basic conversation/direction: With no assist  Expression Expression Mode: Verbal Expression: 5-Expresses basic needs/ideas: With extra time/assistive device  Social Interaction Social Interaction: 4-Interacts appropriately 75 - 89% of the time - Needs redirection for appropriate language or to initiate interaction.  Problem Solving Problem Solving: 5-Solves basic 90% of the time/requires cueing < 10% of the time  Memory Memory: 5-Recognizes or recalls 90% of the time/requires cueing < 10% of the time    Medical Problem List and Plan:  1. DVT Prophylaxis/Anticoagulation: Lovenox   2. Pain Management: N/A   3. Mood: Motivated. Mood fairly upbeat at present. No signs of post CVA depression  4. HTN: controlled.  Started on lisinopril and norvascon 04/18.Marland Kitchen    Systolic spikes Qd.  Norvasc increased to 10mg , will increase lisinopril as well.   5. Dysphagia: Continue D3 diet with nectar liquids and full supervision to prevent aspiration. . Continue to monitor closely  6. Impaired Fasting Glucose:  cbg's have been normal.  7. B 12 deficiency: IM injection last done 4/19  LOS (Days) 19 A FACE TO FACE EVALUATION WAS PERFORMED  Hildagard Sobecki E 01/28/2012, 8:21 AM

## 2012-01-28 NOTE — Progress Notes (Signed)
Social Work Patient ID: Shane Juarez, male   DOB: [birth year], 78 y.o.   MRN: [id number] Met with pt and wife who report after discussing over the weekend they have decided that short Term NHP is the best option for them at this time.  Wife has visited [address] and [address] and would Like either facility.  Will send off FL2 and await bed offer.  Both feel comfortable with this decision and realize pt needs More rehab before returning home to wife.

## 2012-01-28 NOTE — Progress Notes (Signed)
Called for assistance during night. Bed alarm activated secondary to decreased safety awareness. Coughing/spitting up thin clear sputum. Refused scheduled biotene last night and refused senna s last night and Saturday night.Shane Juarez

## 2012-01-28 NOTE — Progress Notes (Signed)
Social Work Patient ID: Shane Juarez, male   DOB: Feb 21, 1930, 78 y.o.   MRN: [id number] Met with pt and wife to inform [facility] has offered a bed fr tomorrow.  This is pt's and wife's first choice And they have accepted.  Plan is for transfer tomorrow at 11:00 via ambulance.  Wife to be there at 10:00 to complete Paperwork.  Both pleased with plan and agreeable.  Team and PA aware of discharge tomorrow.

## 2012-01-28 NOTE — Progress Notes (Signed)
Physical Therapy Note  Patient Details  Name: Shane Juarez MRN: [id number] Date of Birth: Sep 28, 1929 Today's Date: 01/28/2012  1440-1520 (40 minutes) individual Pain: no complaint of pain Focus of treatment: Gait training focusing on increased weight shift to left and decrease scissoring gait RT LE (midline stance) Treatment: Gait with rail left with shoe lift (apporximately 1/2 inch on right to increase weight shift to left in stance) min assist 30 feet x 4 with mod  vcs to abduct RT LE during swing. Pt able to maintain hip width BOS approximately 50% of time. GAit sideways aling rail . Gait 50 feet RW min/mod assist with cues as above. Gait with therapist in front min/mod assist with improve BOS.    HOUT,JIM 01/28/2012, 4:13 PM

## 2012-01-28 NOTE — Discharge Summary (Signed)
Shane Juarez NO.:  [id number]  MEDICAL RECORD NO.:  [id number]  LOCATION:  4151                         FACILITY:  [facility]  PHYSICIAN:  Shane Juarez, M.D.DATE OF BIRTH:  11/26/1929  DATE OF ADMISSION:  01/09/2012 DATE OF DISCHARGE:  01/29/2012                              DISCHARGE SUMMARY   DISCHARGE DIAGNOSES: 1. Right cerebellar infarct. 2. B12 deficiency. 3. Hypertension. 4. Impaired fasting glucose.  HISTORY OF PRESENT ILLNESS:  [name] is an 78 year old male with history of hypertension, admitted on April 14, with 3-day history of difficulty with walking.  CT of head showed acute right cerebellar infarct with mass effect compression 4th ventricle.  CT of cervical spine showed multilevel spondylosis.  Followup MRI/MRA of brain done revealing right PICA infarct with petechial hemorrhage, severe cerebellar edema with mass effect, occlusion of distal right vertebral artery with question of dissection.  Neurology felt the patient had CVA due to large vessel atherosclerosis leading to right VA occlusion, and aspirin was recommended for CVA prophylaxis.  The patient was noted to have dysphagia and was started on D3 diet, nectar liquids.  Therapies were initiated, and rehab was consulted for progression.  PAST MEDICAL HISTORY:  Hypertension.  FUNCTIONAL HISTORY:  The patient was independent and active prior to admission.  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  He was still driving.  FUNCTIONAL STATUS:  The patient was supervision to total assist, 50% for sit to stand transfers. Total assist 50% for ambulating 20 feet with loss of balance to the right and difficulty correcting without facilitation of weight shifting by therapist.  Noted to have ataxic gait.  He required max assist for grooming.  Min assist for upper body dressing and max assist for toileting.  RECENT LABS:  Check of lytes from  May 2, revealed sodium 139, potassium 3.8, chloride 104, CO2 of 23, BUN 12, creatinine 1.12, glucose 134.  CBC from April 18, revealed hemoglobin 11.9, hematocrit 35.0, white count 7.3, and platelets 222.  Hemoglobin A1c checked on April 15, was a 6.3.  HOSPITAL COURSE:  [name] was admitted to rehab on January 09, 2012, for inpatient therapies to consist of PT, OT, and speech therapy at least 3 hours 5 days a week.  Past admission, physiatrist, rehab, RN, and therapy team have worked together to provide customized collaborative interdisciplinary care.  Rehab RN has worked the patient on bowel and bladder program as well as  toileting and safety.  The patient's hydration status was monitored with routine check of lytes while on nectar liquids.  He has been advanced to thin liquids and is noted to be maintaining adequate hydration.  He was maintained on carb-modified diet with blood sugars running at 120 to 140s range.  The patient's p.o. intake has been good.  He has been continent of bowel and bladder.   Speech therapy has worked with the patient on dysphagia treatment.  Currently, he is tolerating regular Textures with thin liquids  without signs or symptoms of aspiration, and intermittent supervision is recommended to continue at mealtimes.  Speech therapy has signed off as of May  3.  Physical therapy has been working with the patient on balance, mobility, as well as strengthening. The patient is currently at min assist for transfers and mobility.  He continues to require cuing to help with weight shifting and body mechanics. OT has worked with the patient on ADLs. The patient continues to be impulsive with sit-to-stand transfers and has frequent loss of balance to the right even with handheld assist on the right or use of rolling walker.  He is currently requiring min assist for ADL tasks.  Wife has elected on continuing further therapies at SNF levels.  Bed is available at  [facility], and the patient is to be discharged to this facility for further therapies.  DISCHARGE MEDICATIONS: 1. Norvasc 10 mg p.o. per day. 2. Enteric-coated aspirin 325 mg per day. 3. Prinivil 20 mg p.o. per day. 4. Senokot 10 mL p.o. at bedtime.  DIET:  Carb modified medium with intermittent supervision.  No straws small bites, small sips.  Meds whole in puree.  ACTIVITY LEVEL:  24-hour supervision/assistance.  SPECIAL INSTRUCTIONS:  Progressive PT, OT to continue past discharge. Continue CBG checks on b.i.d. basis for now.  FOLLOWUP:  The patient to follow up with Dr. Pearlean Brownie in 4-6 weeks.  Follow up with Dr. Claudette Laws on June 6, at 12 o'clock for 12:30 appointment.  Follow up with MD at [facility] for medical issues.     Delle Reining, P.A.   ______________________________ Shane Juarez, M.D.    PL/MEDQ  D:  01/28/2012  T:  01/28/2012  Job:  [id number]  cc:   Pramod P. Pearlean Brownie, MD Anna Genre Little, M.D.

## 2012-01-28 NOTE — Progress Notes (Signed)
Per State Regulation 482.30 This chart was reviewed for medical necessity with respect to the patient's Admission/Duration of stay. [name]                 Nurse Care Manager            Next Review Date: 01/28/12

## 2012-01-28 NOTE — Progress Notes (Signed)
Physical Therapy Note  Patient Details  Name: Shane Juarez MRN: [id number] Date of Birth: 07/23/1930 Today's Date: 01/28/2012  1100-1155 (55 minutes) individual Pain: no complaint of pain Focus of treatment: Therapeutic activities to facilitate midline stance (decrease right lean in stance); gait training focusing on increased weight shift to left  Treatment: Kinetron in sitting X 2 minutes; Kinetron in standing with tactile cues for weight shift to left; Kinetron sit to stand with LT knee flexed for strengthening RT hip/knee in stance; gait (shoe lift on right) with RW mod assist with manual weight shifts at hips to left.; sit to stand using Lt UE only to assist.   [name],JIM 01/28/2012, 11:51 AM

## 2012-01-28 NOTE — Progress Notes (Signed)
Discharge summary [phone]

## 2012-01-28 NOTE — Progress Notes (Signed)
Occupational Therapy Session Note  Patient Details  Name: Shane Juarez MRN: [id number] Date of Birth: 1929-12-15  Today's Date: 01/28/2012 Time: 4098-1191 Time Calculation (min): 44 min  Skilled Therapeutic Interventions/Progress Updates:   Neuromuscular re-education session with focus on weight shifts to the left in standing, dynamic standing balance, and functional mobility.  Initially had pt stand at the high/low table and focus on balancing himself on just the left leg while stabilizing only with the left hand.  Also used therapy wedge for pt to stand on with the LLE to help promote greater weight shift.  Also incorporated shoe lift on the right side to help decrease the amount of right side lean with dynamic standing.  Pt still with significant LOB forward and to the right in standing during these tasks.   Therapy Documentation Precautions:  Precautions Precautions: Fall Precaution Comments: water protocol  Restrictions Weight Bearing Restrictions: No Other Position/Activity Restrictions: Severe fall and lean to the right side with decreased awareness to fix it.  Pain: Pain Assessment Pain Assessment: No/denies pain ADL: See FIM for current functional status  Therapy/Group: Individual Therapy  MCGUIRE,JAMES 01/28/2012, 3:49 PM

## 2012-01-28 NOTE — Progress Notes (Signed)
Occupational Therapy Session Note  Patient Details  Name: Shane Juarez MRN: [id number] Date of Birth: 1930-02-11  Today's Date: 01/28/2012 Time: 8413-2440 Time Calculation (min): 41 min  Skilled Therapeutic Interventions/Progress Updates:    Worked on selfcare retraining with focus on dynamic standing balance for functional shower transfers and dressing sit to stand at the sink.  Pt still impulsive with sit to stand and does not position the right foot at times and also attempts to pull up pants or push them down before his balance has been established.  Still with frequent LOB to the right, even with use of RW or hand held assist on the right side. With mobility pt leans forward as well, almost falling and then trying to keep LEs caught up with his feet so he doesn't.      Therapy Documentation Precautions:  Precautions Precautions: Fall Precaution Comments: water protocol  Restrictions Weight Bearing Restrictions: No Other Position/Activity Restrictions: Severe fall and lean to the right side with decreased awareness to fix it.   ADL: See FIM for current functional status  Therapy/Group: Individual Therapy  Bannon Giammarco 01/28/2012, 1:20 PM

## 2012-01-29 DIAGNOSIS — I6330 Cerebral infarction due to thrombosis of unspecified cerebral artery: Secondary | ICD-10-CM

## 2012-01-29 DIAGNOSIS — G8110 Spastic hemiplegia affecting unspecified side: Secondary | ICD-10-CM

## 2012-01-29 DIAGNOSIS — Z5189 Encounter for other specified aftercare: Secondary | ICD-10-CM

## 2012-01-29 NOTE — Progress Notes (Signed)
Occupational Therapy Discharge Summary  Patient Details  Name: Shane Juarez MRN: [id number] Date of Birth: [birth year]  Today's Date: 01/29/2012 Time: 8:00-8:35    Therapy Session:  Pt performed showering sitting on seat and using grab bars for balance.  Required min assist for transfer in and out of the shower to the sink for dressing tasks.  Pt needing mod instructional cues this session to stand up all the way and get his balance, then pull his pants over his hips instead of trying to pull his pants up as soon as he is trying to stand.  Demonstrated one LOB to the right when attempting to pull pants over her hips.    Patient has met 7 of 13 long term goals due to improved balance, ability to compensate for deficits and improved coordination.  Patient to discharge at [facility] Assist level.  Pt is discharging to SNF for further rehab in order to reach at least supervision level for discharge home with wife. Reasons goals not met: Pt continues to need at least min assist for balance with basic transfers and selfcare tasks sit to stand.  Recommendation:  Patient will benefit from ongoing skilled OT services in skilled nursing facility setting to continue to advance functional skills in the area of BADL.  Equipment: No equipment provided  Reasons for discharge: discharge from hospital  Patient/family agrees with progress made and goals achieved: Yes  OT Discharge ADL ADL Eating: Modified independent Where Assessed-Eating: Wheelchair Grooming: Setup Where Assessed-Grooming: Sitting at sink;Wheelchair Upper Body Bathing: Setup Where Assessed-Upper Body Bathing: Wheelchair;Sitting at sink Lower Body Bathing: Supervision/safety Where Assessed-Lower Body Bathing: Standing at sink;Sitting at sink;Wheelchair Upper Body Dressing: Setup Where Assessed-Upper Body Dressing: Sitting at sink;Standing at sink;Wheelchair Lower Body Dressing: Minimal assistance;Minimal cueing Where Assessed-Lower  Body Dressing: Sitting at sink;Standing at sink;Wheelchair Toileting: Minimal assistance Where Assessed-Toileting: Bedside Commode;Teacher, adult education: Minimal assistance;Minimal Advice worker Method: Surveyor, minerals: Animator Transfer: Minimal assistance Tub/Shower Transfer Method: Stand pivot Tub/Shower Equipment: Grab bars;Shower seat with back Film/video editor: Insurance underwriter Method: Designer, industrial/product: Shower seat with back ADL Comments: Pt still with frequent LOB to the right in standing especially with shower transfers ambulating.  Min assist for static standing to manage clothing over hips or while standing to perfrom grooming tasks. Vision/Perception     Cognition Overall Cognitive Status: Impaired Arousal/Alertness: Awake/alert Orientation Level: Oriented X4 Attention: Selective Sustained Attention: Appears intact Memory: Impaired Memory Impairment: Decreased recall of new information Awareness Impairment: Anticipatory impairment Problem Solving: Impaired Safety/Judgment: Impaired Comments: Pt continues to need instructional cueing for safety, especially with sit to stand.  does not position the LUE and push up from the wheelchair consistently. Sensation Sensation Light Touch: Appears Intact Stereognosis: Appears Intact Hot/Cold: Appears Intact Proprioception: Impaired Detail (impaired body in space awareness) Coordination Gross Motor Movements are Fluid and Coordinated: No Fine Motor Movements are Fluid and Coordinated: No Coordination and Movement Description: Very sligth ataxia and decreased coordinationin the left hand but significantly improved from initial evaluation. Motor  Motor Motor: Ataxia;Abnormal postural alignment and control   Trunk/Postural Assessment  Cervical Assessment Cervical Assessment: Within Functional Limits Thoracic  Assessment Thoracic Assessment: Within Functional Limits Lumbar Assessment Lumbar Assessment: Within Functional Limits Postural Control Protective Responses: Pt with delayed balance reactiion and increased weight shift and lean to the right side in standing.  Pt needing at least min asisst to correct during dynamic activities.  Balance Balance  Balance Assessed: Yes Static Sitting Balance Static Sitting - Balance Support: No upper extremity supported Static Sitting - Level of Assistance: 5: Stand by assistance Dynamic Sitting Balance Dynamic Sitting - Level of Assistance: 4: Min assist Static Standing Balance Static Standing - Balance Support: Bilateral upper extremity supported Static Standing - Level of Assistance: 3: Mod assist Dynamic Standing Balance Dynamic Standing - Level of Assistance: Not tested (comment) Extremity/Trunk Assessment RUE Assessment RUE Assessment: Exceptions to [facility] RUE AROM (degrees) RUE Overall AROM Comments: Very slight ataxic movement in the LUE compared to the right.  Strength however 5/5 throughout. LUE Assessment LUE Assessment: Within Functional Limits  See FIM for current functional status  Tela Kotecki 01/29/2012, 3:44 PM

## 2012-01-29 NOTE — Progress Notes (Addendum)
Physical Therapy Discharge Summary  Patient Details  Name: Shane Juarez MRN: [id number] Date of Birth: Jan 30, 1930  Today's Date: 01/29/2012 Time: 1191-4782 Time Calculation (min): 35 min  Patient has met 7 of 8 long term goals due to improved activity tolerance, improved balance, improved postural control, increased strength, ability to compensate for deficits, functional use of  right upper extremity, right lower extremity, left upper extremity and left lower extremity and improved coordination.  Patient to discharge at ambulatory with PT but also propels w/c level Modified Independent.   Patient's care partner unable to provide the necessary physical and cognitive assistance at discharge.  Reasons goals not met car transfer stand pivot requires min A for balance, S not met Recommendation:  Patient will benefit from ongoing skilled PT services in skilled nursing facility setting to continue to advance safe functional mobility, address ongoing impairments in postural control and balance, coordination, and minimize fall risk.  Equipment: No equipment provided  Reasons for discharge: discharge from hospital  Patient/family agrees with progress made and goals achieved: Yes  PT Discharge Precautions/Restrictions Precautions Precautions: Fall Vital Signs Therapy Vitals Temp: 98.4 F (36.9 C) Temp src: Oral Pulse Rate: 83  Resp: 17  BP: 147/83 mmHg Patient Position, if appropriate: Lying Oxygen Therapy SpO2: 99 % O2 Device: None (Room air) Pain Pain Assessment Pain Assessment: No/denies pain Vision/Perception  Vision - History Baseline Vision: Wears glasses only for reading Patient Visual Report: No change from baseline Vision - Assessment Eye Alignment: Within Functional Limits Perception Perception: Within Functional Limits Praxis Praxis: Intact  Cognition Overall Cognitive Status: Impaired Arousal/Alertness: Awake/alert Orientation Level: Oriented X4 Attention:  Selective Sustained Attention: Appears intact Memory: Impaired Memory Impairment: Decreased recall of new information Awareness Impairment: Anticipatory impairment Problem Solving: Impaired Safety/Judgment: Impaired Comments: Pt continues to need instructional cueing for safetySensation Sensation Light Touch: Appears Intact Stereognosis: Appears Intact Hot/Cold: Appears Intact Proprioception: Impaired Detail (impaired body in space awareness) Coordination Gross Motor Movements are Fluid and Coordinated: No Fine Motor Movements are Fluid and Coordinated: No Coordination and Movement Description: Very sligth ataxia and decreased coordinationin the left hand but significantly improved from initial evaluation. Finger Nose Finger Test: impaired on R Heel Shin Test: more impaired on L at d/c Motor  Motor Motor: Abnormal postural alignment and control Motor - Discharge Observations: Decreased motor coordination and control in the LLE with mobility.  Mobility Bed Mobility Bed Mobility: Rolling Right;Rolling Left;Right Sidelying to Sit;Left Sidelying to Sit;Supine to Sit (I) Transfers Sit to Stand: 4: Min assist Stand to Sit: 4: Min Designer, television/film set Transfers: 5: Supervision Squat Pivot Transfer Details: Verbal cues for technique Locomotion  Ambulation Ambulation/Gait Assistance: 4: Min assist Ambulation Distance (Feet): 150 Feet Assistive device: Rolling walker Ambulation/Gait Assistance Details: cues to increase stride and to abduct RLE that tends to adduct in swing and increase R lean Stairs / Additional Locomotion Stairs: Yes Stairs Assistance: 4: Min guard Stair Management Technique: Two rails Number of Stairs: 12  Height of Stairs: 6  Wheelchair Mobility Wheelchair Mobility: Yes Wheelchair Assistance: 6: Modified independent (Device/Increase time) Occupational hygienist: Both upper extremities Distance: 150+  Trunk/Postural Assessment  Cervical Assessment Cervical  Assessment: Within Functional Limits Thoracic Assessment Thoracic Assessment: Within Functional Limits Lumbar Assessment Lumbar Assessment: Within Functional Limits Postural Control Protective Responses: Pt with delayed balance reactiion and increased weight shift and lean to the right side in standing.  Pt needing at least min asisst to correct during dynamic activities. Postural Limitations: decreased hip, ankle and step  strategies, tends to keep more weight to R  Balance Balance Balance Assessed: Yes Static Sitting Balance Static Sitting - Level of Assistance: 7: Independent Dynamic Sitting Balance Dynamic Sitting - Level of Assistance: 7: Independent Static Standing Balance Static Standing - Level of Assistance: 4: Min assist Static Stance: Eyes Closed: loses balance posterior;ly and to R Dynamic Standing Balance Dynamic Standing - Level of Assistance: 4: Min assist (with 1 UE support and limited weight shifts) Extremity Assessment      RLE Assessment RLE Assessment: Exceptions to [facility] RLE AROM (degrees) RLE Overall AROM Comments: decreased coordination, especially in open chain movment LLE Assessment LLE Assessment: Within Functional Limits  TIME IN/OUT [phone] Individual therapy Pain:none Skilled Treatment:Reeval, to SNF today NMR- to increase control and coordination RLE with gait and steps and improve L weight shift, Biodex postural control and weight shift training (stability index>12-poor with weigth way over to right in static stance); initially needed max A to shift hips past midline L but progressed to min A without UE support Floor transfer without assist for fall recovery, safety education Car transfer stand pivot with min A, household gait training working on step length (needs to decrease step when backing up)and positioning RLE  See FIM for current functional status  Michaelene Song 01/29/2012, 9:26 AM

## 2012-01-29 NOTE — Progress Notes (Signed)
Pt discharged via ambulance to [facility], wife present packed up belongings

## 2012-01-29 NOTE — Progress Notes (Signed)
Patient ID: JUNAH YAM, male   DOB: 08/22/1930, 78 y.o.   MRN: [id number] Subjective/Complaints:\ No more falls. No c/os  Review of Systems  Constitutional: Positive for malaise/fatigue.  HENT: Negative.   Gastrointestinal: Negative for abdominal pain and constipation.  Genitourinary: Negative for dysuria and urgency.  Musculoskeletal: Negative for myalgias.  Skin: Negative.   Neurological: Positive for dizziness. Now intermittent. Psychiatric/Behavioral: Negative for depression.               Objective: Vital Signs: Blood pressure 104/67, pulse 79, temperature 98.4 F (36.9 C), temperature source Oral, resp. rate 17, height 5\' 9"  (1.753 m), weight 82.2 kg (181 lb 3.5 oz), SpO2 98.00%. No results found. No results found for this or any previous visit (from the past 72 hour(s)).  Physical Exam  Constitutional: He is oriented to person, place, and time. He appears well-developed and well-nourished.  HENT: neck FROM no pain Head: Normocephalic and atraumatic.  Right Ear: External ear normal.  Left Ear: External ear normal.  Nose: Nose normal.  Mouth/Throat: Oropharynx is clear and moist.  Eyes: Conjunctivae and EOM are normal. Pupils are equal, round, and reactive to light.   Lungs Clear Cor RRR no murmur Abd +BS soft Neurological: He is alert and oriented to person, place, and time. A cranial nerve deficit (nystagmus to right lateral field) is present. Coordination abnormal.  Follows basic 1 and 2 step commands without difficulty. Speech soft and raspy (not baseline).  Mild ataxia R F/N/F Voice is hoarse no dysarthria Skin: Skin is warm and dry.  Psychiatric: He has a flat mood and affect. His behavior is normal. Judgment and thought content normal MSK- no pain to palpation or with ROM in BUE and BLEs  Assessment/Plan: 1. 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  Elected on SNF for further therapies and bed available for today. Discharge to SNF today.   FIM: FIM - Bathing Bathing Steps Patient Completed: Chest;Right Arm;Left lower leg (including foot);Left Arm;Abdomen;Front perineal area;Buttocks;Right upper leg;Left upper leg;Right lower leg (including foot) Bathing: 4: Steadying assist  FIM - Upper Body Dressing/Undressing Upper body dressing/undressing steps patient completed: Thread/unthread right sleeve of pullover shirt/dresss;Pull shirt over trunk;Put head through opening of pull over shirt/dress;Thread/unthread left sleeve of pullover shirt/dress Upper body dressing/undressing: 5: Supervision: Safety issues/verbal cues FIM - Lower Body Dressing/Undressing Lower body dressing/undressing steps patient completed: Thread/unthread right underwear leg;Thread/unthread left underwear leg;Pull underwear up/down;Thread/unthread right pants leg;Don/Doff left shoe;Don/Doff right shoe;Don/Doff left sock;Don/Doff right sock;Fasten/unfasten pants;Pull pants up/down;Thread/unthread left pants leg;Fasten/unfasten right shoe;Fasten/unfasten left shoe Lower body dressing/undressing: 4: Min-Patient completed 75 plus % of tasks  FIM - Toileting Toileting steps completed by patient: Adjust clothing prior to toileting Toileting Assistive Devices: Grab bar or rail for support Toileting: 4: Steadying assist  FIM - Diplomatic Services operational officer Devices: Grab bars;Walker Toilet Transfers: 4-To toilet/BSC: Min A (steadying Pt. > 75%)  FIM - Bed/Chair Transfer Bed/Chair Transfer Assistive Devices:  (squat pivot) Bed/Chair Transfer: 5: Supine > Sit: Supervision (verbal cues/safety issues);4: Bed > Chair or W/C: Min A (steadying Pt. > 75%)  FIM - Locomotion: Wheelchair Distance: 150' Locomotion: Wheelchair: 1: Total Assistance/staff pushes wheelchair (Pt<25%) FIM - Locomotion: Ambulation Locomotion: Ambulation Assistive Devices: Dealer Ambulation/Gait Assistance: 3: Mod assist Locomotion: Ambulation: 3: Travels 150 ft or more with moderate assistance (Pt: 50 - 74%)  Comprehension Comprehension Mode: Auditory Comprehension:  5-Understands complex 90% of the time/Cues < 10% of the time  Expression Expression Mode: Verbal Expression: 5-Expresses basic needs/ideas: With extra time/assistive device  Social Interaction Social Interaction: 4-Interacts appropriately 75 - 89% of the time - Needs redirection for appropriate language or to initiate interaction.  Problem Solving Problem Solving: 5-Solves basic 90% of the time/requires cueing < 10% of the time  Memory Memory: 5-Recognizes or recalls 90% of the time/requires cueing < 10% of the time    Medical Problem List and Plan:  1. DVT Prophylaxis/Anticoagulation: Lovenox   2. Pain Management: N/A   3. Mood: Motivated. Mood fairly upbeat at present. No signs of post CVA depression  4. HTN: controlled.  Started on lisinopril and norvascon 04/18.Marland Kitchen    Systolic spikes Qd.  Monitor with increase in lisinopril.  5. Dysphagia: Has been advanced to regular textures with thin liquids.  No straws due to aspiration risk. Intermittent supervision to cue for small bites/sips.  6. Impaired Fasting Glucose:  cbg's have been 120-140 range.  Continue CM diet and follow up with primary MD regarding need for oral meds in the future.  7. B 12 deficiency: IM injection last done 4/19. Next dose on 5/17.  LOS (Days) 20 A FACE TO FACE EVALUATION WAS PERFORMED  Jacquelynn Cree 01/29/2012, 7:38 AM

## 2012-01-29 NOTE — Progress Notes (Signed)
Social Work Discharge Note Discharge Note  The overall goal for the admission was met for:   Discharge location: Yes-[facility]  Length of Stay: Yes-20 DAYS  Discharge activity level: Yes-MIN LEVEL  Home/community participation: Yes  Services provided included: MD, RD, PT, OT, SLP, RN, CM, TR, Pharmacy and SW  Financial Services: Medicare and Private Insurance: [employer]  Follow-up services arranged: Other: SHORT TERM NHP  Comments (or additional information):  Patient/Family verbalized understanding of follow-up arrangements: Yes  Individual responsible for coordination of the follow-up plan: ETHEL-WIFE  Confirmed correct DME delivered: Lucy Chris 01/29/2012    Lucy Chris

## 2012-02-25 ENCOUNTER — Encounter: Payer: Medicare Other | Attending: Physical Medicine & Rehabilitation

## 2012-02-25 ENCOUNTER — Encounter: Payer: Self-pay | Admitting: Physical Medicine & Rehabilitation

## 2012-02-25 ENCOUNTER — Ambulatory Visit (HOSPITAL_BASED_OUTPATIENT_CLINIC_OR_DEPARTMENT_OTHER): Payer: Medicare Other | Admitting: Physical Medicine & Rehabilitation

## 2012-02-25 VITALS — BP 151/87 | HR 62 | Ht 70.0 in | Wt 185.0 lb

## 2012-02-25 DIAGNOSIS — I639 Cerebral infarction, unspecified: Secondary | ICD-10-CM

## 2012-02-25 DIAGNOSIS — R42 Dizziness and giddiness: Secondary | ICD-10-CM | POA: Insufficient documentation

## 2012-02-25 DIAGNOSIS — I6350 Cerebral infarction due to unspecified occlusion or stenosis of unspecified cerebral artery: Secondary | ICD-10-CM | POA: Insufficient documentation

## 2012-02-25 DIAGNOSIS — I69993 Ataxia following unspecified cerebrovascular disease: Secondary | ICD-10-CM

## 2012-02-25 DIAGNOSIS — M47812 Spondylosis without myelopathy or radiculopathy, cervical region: Secondary | ICD-10-CM | POA: Insufficient documentation

## 2012-02-25 DIAGNOSIS — R279 Unspecified lack of coordination: Secondary | ICD-10-CM | POA: Insufficient documentation

## 2012-02-25 DIAGNOSIS — R269 Unspecified abnormalities of gait and mobility: Secondary | ICD-10-CM | POA: Insufficient documentation

## 2012-02-25 NOTE — Progress Notes (Signed)
Subjective:    Patient ID: Shane Juarez, male    DOB: [birth year], 78 y.o.   MRN: [id number]  HPI Shane Juarez is an 78 year old male with  history of hypertension, admitted on April 14, with 3-day history of  difficulty with walking. CT of head showed acute right cerebellar  infarct with mass effect compression 4th ventricle. CT of cervical  spine showed multilevel spondylosis. Followup MRI/MRA of brain done  revealing right PICA infarct with petechial hemorrhage, severe  cerebellar edema with mass effect, occlusion of distal right vertebral  artery with question of dissection. Neurology felt the patient had CVA  due to large vessel atherosclerosis leading to right VA occlusion, and  aspirin was recommended for CVA prophylaxis.  Patient has been discharged from [facility] to [place] skilled nursing facility. He is return to home and has been there for proximally one week. No falls since being at home. He is receiving home health physical therapy and occupational therapy. Patient has not seen any of his other doctors in the office yet Pain Inventory Average Pain 0 Pain Right Now 0 My pain is n/a  In the last 24 hours, has pain interfered with the following? General activity 0 Relation with others 0 Enjoyment of life 0 What TIME of day is your pain at its worst? n/a Sleep (in general) Good  Pain is worse with: n/a Pain improves with: rest and n/a Relief from Meds: n/a  Mobility walk with assistance use a walker do you drive?  no use a wheelchair transfers alone Do you have any goals in this area?  yes  Function retired Do you have any goals in this area?  no  Neuro/Psych dizziness  Prior Studies Any changes since last visit?  no  Physicians involved in your care Primary care Catha Gosselin   Family History  Problem Relation Age of Onset  . Diabetes Sister   . Heart attack Sister    History   Social History  . Marital Status: Single    Spouse  Name: N/A    Number of Children: N/A  . Years of Education: N/A   Social History Main Topics  . Smoking status: Never Smoker   . Smokeless tobacco: None  . Alcohol Use: No  . Drug Use: No  . Sexually Active:    Other Topics Concern  . None   Social History Narrative  . None   History reviewed. No pertinent past surgical history. Past Medical History  Diagnosis Date  . Hypertension   . Stroke    BP 151/87  Pulse 62  Ht 5\' 10"  (1.778 m)  Wt 185 lb (83.915 kg)  BMI 26.54 kg/m2  SpO2 94%      Review of Systems  Neurological: Positive for dizziness.  All other systems reviewed and are negative.       Objective:   Physical Exam  Constitutional: He is oriented to person, place, and time. He appears well-developed and well-nourished.  Neurological: He is alert and oriented to person, place, and time. He has normal strength. No sensory deficit. Coordination and gait abnormal.       Falls to the right side Moderate ataxia finger nose to finger on the right side only  Psychiatric: He has a normal mood and affect.          Assessment & Plan:  1. 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 Continue home health therapy. I'll recheck the patient in one month and  consider change to outpatient therapy. No driving May travel but needs to get out of car every 2 hours to stretch

## 2012-02-25 NOTE — Patient Instructions (Signed)
No driving You may travel but need to get out of the car every 2 hours to move around Continue home health therapy See me in one month, may change therapies to outpatient therapy in the clinic

## 2012-04-01 ENCOUNTER — Encounter: Payer: Self-pay | Admitting: Physical Medicine & Rehabilitation

## 2012-04-01 ENCOUNTER — Encounter: Payer: Medicare Other | Attending: Physical Medicine & Rehabilitation

## 2012-04-01 ENCOUNTER — Ambulatory Visit (HOSPITAL_BASED_OUTPATIENT_CLINIC_OR_DEPARTMENT_OTHER): Payer: Medicare Other | Admitting: Physical Medicine & Rehabilitation

## 2012-04-01 VITALS — BP 167/84 | HR 99 | Resp 16 | Ht 70.0 in | Wt 189.4 lb

## 2012-04-01 DIAGNOSIS — I69998 Other sequelae following unspecified cerebrovascular disease: Secondary | ICD-10-CM

## 2012-04-01 DIAGNOSIS — R209 Unspecified disturbances of skin sensation: Secondary | ICD-10-CM

## 2012-04-01 DIAGNOSIS — I6350 Cerebral infarction due to unspecified occlusion or stenosis of unspecified cerebral artery: Secondary | ICD-10-CM | POA: Insufficient documentation

## 2012-04-01 DIAGNOSIS — R279 Unspecified lack of coordination: Secondary | ICD-10-CM | POA: Insufficient documentation

## 2012-04-01 DIAGNOSIS — R42 Dizziness and giddiness: Secondary | ICD-10-CM | POA: Insufficient documentation

## 2012-04-01 DIAGNOSIS — R269 Unspecified abnormalities of gait and mobility: Secondary | ICD-10-CM | POA: Insufficient documentation

## 2012-04-01 DIAGNOSIS — I69993 Ataxia following unspecified cerebrovascular disease: Secondary | ICD-10-CM

## 2012-04-01 DIAGNOSIS — M47812 Spondylosis without myelopathy or radiculopathy, cervical region: Secondary | ICD-10-CM | POA: Insufficient documentation

## 2012-04-01 MED ORDER — GABAPENTIN 100 MG PO CAPS: 100.0000 mg | ORAL_CAPSULE | Freq: Three times a day (TID) | ORAL | Status: DC

## 2012-04-01 NOTE — Patient Instructions (Addendum)
We'll start you on a medicine called gabapentin The most common side effect would be drowsiness. You may have some nausea or even depression although this is more rare. I'll see back in one month

## 2012-04-01 NOTE — Progress Notes (Signed)
Subjective:    Patient ID: Shane Juarez, male    DOB: [birth year], 78 y.o.   MRN: [id number]  HPI [name] is an 78 year old male with  history of hypertension, admitted on April 14, with 3-day history of  difficulty with walking. CT of head showed acute right cerebellar  infarct with mass effect compression 4th ventricle. CT of cervical  spine showed multilevel spondylosis. Followup MRI/MRA of brain done  revealing right PICA infarct with petechial hemorrhage, severe  cerebellar edema with mass effect, occlusion of distal right vertebral  artery with question of dissection  Main c/o is L foot feels cold and sometimes painful Pain Inventory Average Pain 2 Pain Right Now 2 My pain is constant and aching  In the last 24 hours, has pain interfered with the following? General activity 2 Relation with others 1 Enjoyment of life 1 What TIME of day is your pain at its worst? all of the time but particularly when shoe off Sleep (in general) Good  Pain is worse with: walking and taking shoe off Pain improves with: putting shoe on Relief from Meds: not taking medication  Mobility walk with assistance use a walker ability to climb steps?  yes  Function retired  Neuro/Psych trouble walking  Prior Studies Any changes since last visit?  no  Physicians involved in your care Any changes since last visit?  no   Family History  Problem Relation Age of Onset  . Diabetes Sister   . Heart attack Sister    History   Social History  . Marital Status: Single    Spouse Name: N/A    Number of Children: N/A  . Years of Education: N/A   Social History Main Topics  . Smoking status: Former Smoker    Types: Cigarettes    Quit date: 04/02/1967  . Smokeless tobacco: None  . Alcohol Use: No  . Drug Use: No  . Sexually Active: None   Other Topics Concern  . None   Social History Narrative  . None   History reviewed. No pertinent past surgical history. Past Medical  History  Diagnosis Date  . Hypertension   . Stroke   . Hyperlipidemia    BP 167/84  Pulse 99  Resp 16  Ht 5\' 10"  (1.778 m)  Wt 189 lb 6.4 oz (85.911 kg)  BMI 27.18 kg/m2  SpO2 98%    Review of Systems  Musculoskeletal: Positive for gait problem.  All other systems reviewed and are negative.       Objective:   Physical Exam  Constitutional: He is oriented to person, place, and time. He appears well-developed and well-nourished.  Musculoskeletal:       Left ankle: Normal.       Left foot: Normal.  Neurological: He is alert and oriented to person, place, and time. He has normal strength. A sensory deficit is present. Coordination and gait abnormal.       Mild ataxia on Left UE and LE Increased base of support during gait Sensation to cold and pp reduced in L foot and L hand  Psychiatric: He has a normal mood and affect.          Assessment & Plan:  1. 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 I think the left foot symptoms are related to the stroke. Will start gabapentin. 100 mg 3 times a day. Discussed possibility of drowsiness. May need to increase dose next visit. Foot exam is  negative for any type of musculoskeletal issue. Patient is not driving does not wish to resume driving at the current time He is finishing up with home health therapy but does not want to go to outpatient therapy. He uses a walker outside the house but inside the house does not use an assistive device. No falls

## 2012-04-14 ENCOUNTER — Ambulatory Visit: Payer: Medicare Other | Admitting: Physical Medicine & Rehabilitation

## 2012-05-09 ENCOUNTER — Ambulatory Visit (HOSPITAL_BASED_OUTPATIENT_CLINIC_OR_DEPARTMENT_OTHER): Payer: Medicare Other | Admitting: Physical Medicine & Rehabilitation

## 2012-05-09 ENCOUNTER — Encounter: Payer: Medicare Other | Attending: Physical Medicine & Rehabilitation

## 2012-05-09 ENCOUNTER — Encounter: Payer: Self-pay | Admitting: Physical Medicine & Rehabilitation

## 2012-05-09 VITALS — BP 156/7 | HR 94 | Resp 14 | Ht 70.0 in | Wt 189.0 lb

## 2012-05-09 DIAGNOSIS — R269 Unspecified abnormalities of gait and mobility: Secondary | ICD-10-CM | POA: Insufficient documentation

## 2012-05-09 DIAGNOSIS — R209 Unspecified disturbances of skin sensation: Secondary | ICD-10-CM

## 2012-05-09 DIAGNOSIS — I6350 Cerebral infarction due to unspecified occlusion or stenosis of unspecified cerebral artery: Secondary | ICD-10-CM | POA: Insufficient documentation

## 2012-05-09 DIAGNOSIS — R279 Unspecified lack of coordination: Secondary | ICD-10-CM | POA: Insufficient documentation

## 2012-05-09 DIAGNOSIS — M47812 Spondylosis without myelopathy or radiculopathy, cervical region: Secondary | ICD-10-CM | POA: Insufficient documentation

## 2012-05-09 DIAGNOSIS — I69993 Ataxia following unspecified cerebrovascular disease: Secondary | ICD-10-CM

## 2012-05-09 DIAGNOSIS — I69998 Other sequelae following unspecified cerebrovascular disease: Secondary | ICD-10-CM

## 2012-05-09 DIAGNOSIS — R42 Dizziness and giddiness: Secondary | ICD-10-CM | POA: Insufficient documentation

## 2012-05-09 MED ORDER — GABAPENTIN 300 MG PO CAPS: 300.0000 mg | ORAL_CAPSULE | Freq: Three times a day (TID) | ORAL | Status: DC

## 2012-05-09 NOTE — Progress Notes (Signed)
Subjective:    Patient ID: Shane Juarez, male    DOB: 08-16-1930, 78 y.o.   MRN: [id number]  HPI Mr. Eldar Robitaille is an 78 year old male with  history of hypertension, admitted on April 14, with 3-day history of  difficulty with walking. CT of head showed acute right cerebellar  infarct with mass effect compression 4th ventricle. CT of cervical  spine showed multilevel spondylosis. Followup MRI/MRA of brain done  revealing right PICA infarct with petechial hemorrhage, severe  cerebellar edema with mass effect, occlusion of distal right vertebral  artery with question of dissection  Left foot burning pain. No history of trauma. No left hand burning. Does have cold sensation on the right side of the face.  Pain Inventory Average Pain 0 Pain Right Now 0 My pain is n/a  In the last 24 hours, has pain interfered with the following? General activity 0 Relation with others 0 Enjoyment of life 0 What TIME of day is your pain at its worst? n/a Sleep (in general) Good  Pain is worse with: walking Pain improves with: n/a Relief from Meds: 0  Mobility walk without assistance how many minutes can you walk? long time ability to climb steps?  yes do you drive?  yes  Function not employed: date last employed   Neuro/Psych weakness  Prior Studies Any changes since last visit?  no  Physicians involved in your care Any changes since last visit?  no   Family History  Problem Relation Age of Onset  . Diabetes Sister   . Heart attack Sister    History   Social History  . Marital Status: Single    Spouse Name: N/A    Number of Children: N/A  . Years of Education: N/A   Social History Main Topics  . Smoking status: Former Smoker    Types: Cigarettes    Quit date: 04/02/1967  . Smokeless tobacco: None  . Alcohol Use: No  . Drug Use: No  . Sexually Active: None   Other Topics Concern  . None   Social History Narrative  . None   History reviewed. No pertinent past  surgical history. Past Medical History  Diagnosis Date  . Hypertension   . Stroke   . Hyperlipidemia    BP 156/7  Pulse 94  Resp 14  Ht 5\' 10"  (1.778 m)  Wt 189 lb (85.73 kg)  BMI 27.12 kg/m2  SpO2 98%     Review of Systems  Musculoskeletal: Positive for gait problem.  All other systems reviewed and are negative.       Objective:   Physical Exam Constitutional: He is oriented to person, place, and time. He appears well-developed and well-nourished.  Musculoskeletal:  Left ankle: Normal.  Left foot: Normal.  Neurological: He is alert and oriented to person, place, and time. He has normal strength. A sensory deficit is present. Coordination and gait abnormal.  Mild ataxia on Left UE and LE Increased base of support during gait Sensation to cold and pp reduced in L foot  Psychiatric: He has a normal mood and affect.         Assessment & Plan:  1. 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 I think the left foot symptoms are related to the stroke. Will increase gabapentin. 300 mg 3 times a day. Discussed possibility of drowsiness. May need to increase dose next visit. Foot exam is negative for any type of musculoskeletal issue May resume driving no  nighttime driving, no Interstate driving I'll see him back in 2 months

## 2012-05-09 NOTE — Patient Instructions (Addendum)
May resume driving. No Interstate driving or nighttime driving until the next visit.

## 2012-07-08 ENCOUNTER — Ambulatory Visit (HOSPITAL_BASED_OUTPATIENT_CLINIC_OR_DEPARTMENT_OTHER): Payer: Medicare Other | Admitting: Physical Medicine & Rehabilitation

## 2012-07-08 ENCOUNTER — Encounter: Payer: Self-pay | Admitting: Physical Medicine & Rehabilitation

## 2012-07-08 ENCOUNTER — Encounter: Payer: Medicare Other | Attending: Physical Medicine & Rehabilitation

## 2012-07-08 VITALS — BP 166/83 | HR 89 | Resp 14 | Ht 70.0 in | Wt 189.0 lb

## 2012-07-08 DIAGNOSIS — R269 Unspecified abnormalities of gait and mobility: Secondary | ICD-10-CM | POA: Insufficient documentation

## 2012-07-08 DIAGNOSIS — R209 Unspecified disturbances of skin sensation: Secondary | ICD-10-CM

## 2012-07-08 DIAGNOSIS — I6350 Cerebral infarction due to unspecified occlusion or stenosis of unspecified cerebral artery: Secondary | ICD-10-CM | POA: Insufficient documentation

## 2012-07-08 DIAGNOSIS — M47812 Spondylosis without myelopathy or radiculopathy, cervical region: Secondary | ICD-10-CM | POA: Insufficient documentation

## 2012-07-08 DIAGNOSIS — I69998 Other sequelae following unspecified cerebrovascular disease: Secondary | ICD-10-CM

## 2012-07-08 DIAGNOSIS — R279 Unspecified lack of coordination: Secondary | ICD-10-CM | POA: Insufficient documentation

## 2012-07-08 DIAGNOSIS — R42 Dizziness and giddiness: Secondary | ICD-10-CM | POA: Insufficient documentation

## 2012-07-08 NOTE — Patient Instructions (Signed)
Please reduce your gabapentin to twice a day for a week and then reduce it to bedtime only for a week and then discontinue it Call me if your pain comes back I will not schedule another appointment unless your pain worsens again

## 2012-07-08 NOTE — Progress Notes (Signed)
Subjective:    Patient ID: Shane Juarez, male    DOB: [birth year], 78 y.o.   MRN: [id number] Mr. Jacyn Muenchow is an 78 year old male with  history of hypertension, admitted on April 14, with 3-day history of  difficulty with walking. CT of head showed acute right cerebellar  infarct with mass effect compression 4th ventricle. CT of cervical  spine showed multilevel spondylosis. Followup MRI/MRA of brain done  revealing right PICA infarct with petechial hemorrhage, severe  cerebellar edema with mass effect, occlusion of distal right vertebral  artery with question of dissection  HPI Left foot pain and sensation changes. Has been examined by podiatry. No musculoskeletal issues diagnosed. No significant improvements on the gabapentin 300 mg Pain Inventory Average Pain 0 Pain Right Now 0 My pain is intermittent  In the last 24 hours, has pain interfered with the following? General activity 7 Relation with others 7 Enjoyment of life 7 What TIME of day is your pain at its worst? day and night Sleep (in general) Fair  Pain is worse with: walking Pain improves with: pacing activities Relief from Meds: 7  Mobility walk without assistance how many minutes can you walk? 5 ability to climb steps?  yes do you drive?  yes  Function retired  Neuro/Psych trouble walking dizziness  Prior Studies Any changes since last visit?  no  Physicians involved in your care Any changes since last visit?  no   Family History  Problem Relation Age of Onset  . Diabetes Sister   . Heart attack Sister    History   Social History  . Marital Status: Single    Spouse Name: N/A    Number of Children: N/A  . Years of Education: N/A   Social History Main Topics  . Smoking status: Former Smoker    Types: Cigarettes    Quit date: 04/02/1967  . Smokeless tobacco: None  . Alcohol Use: No  . Drug Use: No  . Sexually Active: None   Other Topics Concern  . None   Social History Narrative    . None   History reviewed. No pertinent past surgical history. Past Medical History  Diagnosis Date  . Hypertension   . Stroke   . Hyperlipidemia    BP 166/83  Pulse 89  Resp 14  Ht 5\' 10"  (1.778 m)  Wt 189 lb (85.73 kg)  BMI 27.12 kg/m2  SpO2 99%     Review of Systems  Musculoskeletal: Positive for gait problem.  Neurological: Positive for dizziness.  All other systems reviewed and are negative.       Objective:   Physical Exam Constitutional: He is oriented to person, place, and time. He appears well-developed and well-nourished.  Musculoskeletal:  Left ankle: Normal.  Left foot: Normal.  Neurological: He is alert and oriented to person, place, and time. He has normal strength. A sensory deficit is present. Coordination and gait abnormal.  Mild ataxia on Left UE and LE Increased base of support during gait Sensation to cold and pp reduced in L foot and L hand  Psychiatric: He has a normal mood and affect.         Assessment & Plan:  1. 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 I think the left foot symptoms are related to the stroke. Will start gabapentin. 100 mg 3 times a day. Discussed possibility of drowsiness. May need to increase dose next visit. Foot exam is negative for any type of  musculoskeletal issue The patient was increased to 300 mg of gabapentin still without much improvement.I recommend that he tapers it to 300 mg twice a day for a week and then 300 mg at night per week. If his pain worsens then we will go back on a 3 times per day in fact may go up to 4 times per day He'll call me if his pain worsens. Otherwise no followup appointment scheduled

## 2012-10-28 ENCOUNTER — Encounter: Payer: Self-pay | Admitting: *Deleted

## 2012-12-09 ENCOUNTER — Other Ambulatory Visit: Payer: Self-pay | Admitting: Neurology

## 2012-12-09 DIAGNOSIS — I6350 Cerebral infarction due to unspecified occlusion or stenosis of unspecified cerebral artery: Secondary | ICD-10-CM

## 2012-12-16 ENCOUNTER — Ambulatory Visit (INDEPENDENT_AMBULATORY_CARE_PROVIDER_SITE_OTHER): Payer: Medicare PPO

## 2012-12-16 DIAGNOSIS — I6350 Cerebral infarction due to unspecified occlusion or stenosis of unspecified cerebral artery: Secondary | ICD-10-CM

## 2013-06-10 ENCOUNTER — Ambulatory Visit: Payer: Medicare PPO | Admitting: Nurse Practitioner

## 2013-06-12 ENCOUNTER — Encounter: Payer: Self-pay | Admitting: Nurse Practitioner

## 2013-06-12 ENCOUNTER — Ambulatory Visit (INDEPENDENT_AMBULATORY_CARE_PROVIDER_SITE_OTHER): Payer: Medicare PPO | Admitting: Nurse Practitioner

## 2013-06-12 VITALS — BP 170/78 | HR 91 | Temp 97.9°F | Ht 70.0 in | Wt 185.0 lb

## 2013-06-12 DIAGNOSIS — I6350 Cerebral infarction due to unspecified occlusion or stenosis of unspecified cerebral artery: Secondary | ICD-10-CM

## 2013-06-12 DIAGNOSIS — I639 Cerebral infarction, unspecified: Secondary | ICD-10-CM

## 2013-06-12 NOTE — Patient Instructions (Addendum)
Plan:  ASA 81mg  daily for secondary stroke prevention.   Maintain strict control of hypertension with goal blood pressure below 130/72mmHg and hyperlipidemia with goal LDL's below 100mg %.    Return to clinic in 6 months with NP.

## 2013-06-12 NOTE — Progress Notes (Signed)
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  He presented to [facility] on 01/06/12 with a 3 day history of difficulty walking, he also had noted dysphagia.  CT of head showed acute right cerebellar infarct with mass effect compression 4th ventricle.  MRA of brain showed right PICA infarct with petechial hemorrhage, severe cerebellar edema with mass effect, occlusion of distal right vertebral artery with question of dissection secodary to large vessel atherosclerosis leading to right VA occlusion.  2D Echo showed 65-70% EF.  LDL's 58, HgbA1C 6.3%. Vascular risk factors include hypertension and hyperlipidemia.  He was started on ASA 325mg .  Since being discharged he has done fairly well.  He had inpatient physical and occupational therapy for 3 weeks.   UPDATE 12/08/12 (PS): Returns for follow up since last visit on 07/24/12.  Doing well, he feels he is 95% back to normal, no  new neurovascular symptoms.  Tolerating ASA 325mg  well, without bruising or bleeding.  He is tolerating atorvastatin well without muscle aches.  Denies any falls.  He does not  need have to use a walker now and walks independently.  Has appointment with Dr. Clarene Duke this wednesday.  He continues to become easily fatigued; he also gets Vitamin B12 injections monthly.  His blood pressure at home this morning was 110 systolic; today in office it is 154/73. he has not had any lipid profiles checked since left the hospital. He has no new complaints. He states his balance is slightly off particularly when he tries to walk quickly. He has no longer been using a walker for last few months.    UPDATE 06/12/13 (LL):  Returns for follow up since last visit on 12/08/12.   BP is elevated in office today, reading 170/78.  Wife states it usually runs 140-150 at home, will  check it the next few days. Tolerating ASA 81 mg well, without bruising or bleeding.  He is tolerating atorvastatin well without muscle aches.  Denies any falls, but states he stumbles sometimes.  Had labs done at Dr. Fredirick Maudlin office in January, all were well. No new complaints or problems.  REVIEW OF SYSTEMS: Full 14 system review of systems performed and notable only for:  Constitutional: weight loss  Cardiovascular: N/A  Ear/Nose/Throat: N/A  Skin: rash  Eyes: blurred vision  Respiratory: N/A  Gastroitestinal: N/A  Genitourinary: Impotence Hematology/Lymphatic: N/A  Endocrine: feeling hot (feet) Musculoskeletal:N/A  Allergy/Immunology: N/A  Neurological: N/A Psychiatric: decreased energy Sleep: N/A   ALLERGIES: No Known Allergies  HOME MEDICATIONS: Outpatient Prescriptions Prior to Visit  Medication Sig Dispense Refill  . amLODipine (NORVASC) 5 MG tablet Take 10 mg by mouth daily.       Marland Kitchen aspirin 81 MG tablet Take 81 mg by mouth daily.      Marland Kitchen atorvastatin (LIPITOR) 10 MG tablet Take 1 tablet by mouth Daily.      Marland Kitchen gabapentin (NEURONTIN) 300 MG capsule Take 1 capsule (300 mg total) by mouth 3 (three) times daily.  90 capsule  1  . lisinopril (PRINIVIL,ZESTRIL) 20 MG tablet       . Probiotic Product (PROBIOTIC FORMULA PO) Take 1 tablet by mouth daily.       No facility-administered medications prior to visit.    PAST MEDICAL HISTORY: Past Medical History  Diagnosis Date  . Hypertension   .  Stroke   . Hyperlipidemia     PAST SURGICAL HISTORY: No past surgical history on file.  FAMILY HISTORY: Family History  Problem Relation Age of Onset  . Diabetes Sister   . Heart attack Sister     SOCIAL HISTORY: History   Social History  . Marital Status: Single    Spouse Name: N/A    Number of Children: N/A  . Years of Education: N/A   Occupational History  . Not on file.   Social History Main Topics  . Smoking status: Former Smoker    Types: Cigarettes     Quit date: 04/02/1967  . Smokeless tobacco: Not on file  . Alcohol Use: No  . Drug Use: No  . Sexual Activity: Not on file   Other Topics Concern  . Not on file   Social History Narrative  . No narrative on file     PHYSICAL EXAM  There were no vitals filed for this visit. There is no weight on file to calculate BMI.  General: Pleasant AA male in no distress.  Afebrile.   Head: nontraumatic Ears, Nose and Throat: Hearing is normal.  Neck: supple without bruit Respiratory: clear to auscultation Cardiovascular: no murmur or gallop Skin: No petechiae, mild erythema around mouth folds  Neurologic Exam  Mental Status: Awake, alert and oriented to time, place and person.  Speech and language appear normal.   Cranial Nerves: Eye movements are full range without nystagmus.  Fundi not visualized today.  Visual fields are full to confrontational testing.  Face is symmetric without weakness.  Tongue is midline. Hearing is normal. Motor: reveals no upper or lower extremity drift.  Symmetric and equal strength in all four extremities.  No focal weakness. Subtle right hand orbiting over left.  Left hand diminished fine motor movement Sensory: Touch and temperature sensations are normal.   Coordination: normal Gait and Station: steady gait slight shuffle, unable to tandem walk.  Able to stand on toes but difficulty walking on heels. Reflexes: Deep tendon reflexes are 2+ symmetric.    DIAGNOSTIC DATA (LABS, IMAGING, TESTING) - I reviewed patient records, labs, notes, testing and imaging myself where available. Ct Cervical Spine 01/06/2012 1. No evidence of cervical spine fracture. 2. Multilevel spondylosis. 3. Thyroid nodule on the right. Consider further evaluation with thyroid ultrasound. If patient is clinically hyperthyroid, consider nuclear medicine thyroid uptake and scan.  CT of the brain 01/07/2012 1. Mild interval evolution of acute/subacute right cerebellar infarct, with significant  mass effect and slightly increased leftward midline shift at the posterior fossa, measuring approximately 1.2 cm. Compression of the fourth ventricle again noted. No evidence of hemorrhagic transformation. Smaller left-sided acute/subacute cerebellar infarct again noted. Mass effect on the cerebellar peduncles, without definite evidence of transtentorial herniation; no definite hydrocephalus seen. Scattered small vessel ischemic microangiopathy.  01/06/2012 1. Findings most consistent with large right cerebellar infarction which is acute / subacute. 2. Mass effect in the posterior fossa with compression of the fourth ventricle and midline shift within the posterior fossa.  MRI of the brain large right cerebellar infarct with lateral brain stem infarct as well with mild mass effect on the fourth ventricle but no hydrocephalus  MRA of the brain occluded right vertebral artery. Patent dominant left vertebral artery and basilar artery without significant stenosis.  2D Echocardiogram EF 65-70% with no source of embolus.   Carotid Doppler 12/16/12 Consistent with 50-69% stenosis of the right/left proximal/mid CCA's. Acoustic Shadow plaques were observed in the right/left bulbs  and mild wall thickenings were seen bilaterally.   ASSESSMENT AND PLAN 79 year old African American male with right cerebral infarct with mass effect compression 4th ventricle and occlusion of distal right vertebral artery with question of dissection secodary to large vessel atherosclerosis leading to right VA occlusion on 01/06/12.  Vascular risk factors include hypertension and hyperlipidemia.  Doing well, no new neurovascular symptoms.   Plan:  ASA 81mg  daily for secondary stroke prevention.  Maintain strict control of hypertension with goal blood pressure below 140/88mmHg and hyperlipidemia with goal LDL's below 100mg %.   Advised to use cane for stability. Return to clinic in 6 months with NP.    Ronal Fear, MSN, NP-C 06/12/2013,  1:28 PM [facility] [address] [place], [place] [address] [phone]

## 2013-12-10 ENCOUNTER — Ambulatory Visit (INDEPENDENT_AMBULATORY_CARE_PROVIDER_SITE_OTHER): Payer: Medicare PPO | Admitting: Nurse Practitioner

## 2013-12-10 ENCOUNTER — Encounter (INDEPENDENT_AMBULATORY_CARE_PROVIDER_SITE_OTHER): Payer: Self-pay

## 2013-12-10 ENCOUNTER — Encounter: Payer: Self-pay | Admitting: Nurse Practitioner

## 2013-12-10 VITALS — BP 178/88 | HR 91 | Ht 70.5 in | Wt 180.0 lb

## 2013-12-10 DIAGNOSIS — I672 Cerebral atherosclerosis: Secondary | ICD-10-CM

## 2013-12-10 NOTE — Progress Notes (Signed)
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 He presented to [facility] on 01/06/12 with a 3 day history of difficulty walking, he also had noted dysphagia. CT of head showed acute right cerebellar infarct with mass effect compression 4th ventricle. MRA of brain showed right PICA infarct with petechial hemorrhage, severe cerebellar edema with mass effect, occlusion of distal right vertebral artery with question of dissection secodary to large vessel atherosclerosis leading to right VA occlusion. 2D Echo showed 65-70% EF. LDL's 58, HgbA1C 6.3%. Vascular risk factors include hypertension and hyperlipidemia. He was started on ASA 325mg . Since being discharged he has done fairly well. He had inpatient physical and occupational therapy for 3 weeks.   UPDATE 12/08/12 (PS): Returns for follow up since last visit on 07/24/12. Doing well, he feels he is 95% back to normal, no new neurovascular symptoms. Tolerating ASA 325mg  well, without bruising or bleeding. He is tolerating atorvastatin well without muscle aches. Denies any falls. He does not need have to use a walker now and walks independently. Has appointment with Dr. Clarene Duke this wednesday. He continues to become easily fatigued; he also gets Vitamin B12 injections monthly. His blood pressure at home this morning was 110 systolic; today in office it is 154/73. he has not had any lipid profiles checked since left the hospital. He has no new complaints. He states his balance is slightly off particularly when he tries to walk quickly. He has no longer been using a walker for last few months.   UPDATE 06/12/13 (LL): Returns for follow up since last visit on 12/08/12. BP is elevated in office today, reading 170/78. Wife states it usually runs 140-150 at home, will check it the next few days. Tolerating ASA 81 mg well,  without bruising or bleeding. He is tolerating atorvastatin well without muscle aches. Denies any falls, but states he stumbles sometimes. Had labs done at Dr. Fredirick Maudlin office in January, all were well.  No new complaints or problems.   UPDATE 12/10/13 (LL): Mr. Hank returns for stroke follow up, doing well, no new neurovascular symptoms.  Tolerating ASA 81 mg well, without bruising or bleeding. Denies any falls.  He has his routine checkup appointment with Dr. Clarene Duke tomorrow.  BP is again elevated in office today, 178/88.  REVIEW OF SYSTEMS: Full 14 system review of systems performed and notable only for:  Dizziness, walking difficulty, coordination problems  ALLERGIES: No Known Allergies  HOME MEDICATIONS: Outpatient Prescriptions Prior to Visit  Medication Sig Dispense Refill  . amLODipine (NORVASC) 5 MG tablet Take 10 mg by mouth daily.       Marland Kitchen aspirin 81 MG tablet Take 81 mg by mouth daily.      Marland Kitchen atorvastatin (LIPITOR) 10 MG tablet Take 1 tablet by mouth Daily.      Marland Kitchen lisinopril (PRINIVIL,ZESTRIL) 20 MG tablet        No facility-administered medications prior to visit.     PHYSICAL EXAM  Filed Vitals:   12/10/13 1409  BP: 178/88  Pulse: 91  Height: 5' 10.5" (1.791 m)  Weight: 180 lb (81.647 kg)   Body mass index is 25.45 kg/(m^2).  General: Pleasant AA male in no distress. Afebrile.  Head: nontraumatic  Ears, Nose and Throat: Hearing is normal.  Neck: supple without bruit  Respiratory: clear to auscultation  Cardiovascular: no murmur or gallop  Neurologic Exam  Mental Status: Awake, alert and oriented to time, place and person. Speech and language appear normal.  Cranial Nerves: Eye movements are full range without nystagmus. Fundi not visualized today. Visual fields are full to confrontational testing. Face is symmetric without weakness. Tongue is midline. Hearing is normal.  Motor: reveals no upper or lower extremity drift. Symmetric and equal strength in all  four extremities. No focal weakness. Subtle right hand orbiting over left. Left hand diminished fine motor movement  Sensory: Touch and temperature sensations are normal.  Coordination: normal  Gait and Station: steady gait slight shuffle, unable to tandem walk. Able to stand on toes but difficulty walking on heels.  Reflexes: Deep tendon reflexes are 2+ symmetric.   ASSESSMENT AND PLAN 80 year old African American male with right cerebral infarct with mass effect compression 4th ventricle and occlusion of distal right vertebral artery with question of dissection secondary to large vessel atherosclerosis leading to right VA occlusion on 01/06/12. Vascular risk factors include hypertension and hyperlipidemia. Doing well, no new neurovascular symptoms.   Plan: ASA 81mg  daily for secondary stroke prevention. Maintain strict control of hypertension with goal blood pressure below 140/8180mmHg and hyperlipidemia with goal LDL's below 100mg %.  Repeat Carotid dopplers. Advised to use cane for stability.  Return to clinic in 6 months.  Ronal FearLYNN E. Adyen Bifulco, MSN, NP-C 12/10/2013, 2:20 PM [facility] [address], [address] [place], [place] [id number] [phone]  Note: This document was prepared with digital dictation and possible smart phrase technology. Any transcriptional errors that result from this process are unintentional.

## 2013-12-10 NOTE — Patient Instructions (Addendum)
Plan: ASA 81mg  daily for secondary stroke prevention. Maintain strict control of hypertension with goal blood pressure below 140/1180mmHg and hyperlipidemia with goal LDL's below 100mg %.  Repeat Carotid dopplers. Please have Dr. Clarene DukeLittle check your Cholesterol tomorrow at your checkup. Return to clinic in 6 months.

## 2013-12-22 ENCOUNTER — Ambulatory Visit (INDEPENDENT_AMBULATORY_CARE_PROVIDER_SITE_OTHER): Payer: Medicare PPO

## 2013-12-22 DIAGNOSIS — I672 Cerebral atherosclerosis: Secondary | ICD-10-CM

## 2013-12-22 DIAGNOSIS — I6350 Cerebral infarction due to unspecified occlusion or stenosis of unspecified cerebral artery: Secondary | ICD-10-CM

## 2013-12-25 ENCOUNTER — Telehealth: Payer: Self-pay

## 2013-12-25 NOTE — Telephone Encounter (Signed)
Called patient. Spoke to spouse. Gave results- L Carotid moderate stenosis, no action needed now, will repeat study in 1 year per NP-Lynn Lam. Spouse verbalized understanding.

## 2014-03-02 ENCOUNTER — Encounter: Payer: Self-pay | Admitting: Neurology

## 2014-06-15 ENCOUNTER — Ambulatory Visit: Payer: Medicare PPO | Admitting: Neurology

## 2014-06-22 ENCOUNTER — Encounter (INDEPENDENT_AMBULATORY_CARE_PROVIDER_SITE_OTHER): Payer: Self-pay

## 2014-06-22 ENCOUNTER — Encounter: Payer: Self-pay | Admitting: Nurse Practitioner

## 2014-06-22 ENCOUNTER — Ambulatory Visit (INDEPENDENT_AMBULATORY_CARE_PROVIDER_SITE_OTHER): Payer: Medicare PPO | Admitting: Nurse Practitioner

## 2014-06-22 VITALS — BP 123/64 | HR 79 | Ht 70.5 in | Wt 179.6 lb

## 2014-06-22 DIAGNOSIS — I69993 Ataxia following unspecified cerebrovascular disease: Secondary | ICD-10-CM

## 2014-06-22 DIAGNOSIS — I672 Cerebral atherosclerosis: Secondary | ICD-10-CM

## 2014-06-22 NOTE — Progress Notes (Signed)
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 [facility] with a 3 day history of difficulty walking, he also had noted dysphagia. CT of head showed acute right cerebellar infarct with mass effect compression 4th ventricle. MRA of brain showed right PICA infarct with petechial hemorrhage, severe cerebellar edema with mass effect, occlusion of distal right vertebral artery with question of dissection secodary to large vessel atherosclerosis leading to right VA occlusion.  [name] returns for stroke follow up, doing well, no new neurovascular symptoms.  Tolerating ASA 81 mg well, without bruising or bleeing. Denies any falls. Has new PCP, now seeing Dr. Modesto CharonWong. Went for routine checkup last month, was told all labs were good, no changes in medications were made. Blood pressure is well controlled, it is 123/64 in the office today. Last carotid doppler was in March 2015, showed moderate left ICA stenosis, unchanged from previous year. He has no new complaints.  REVIEW OF SYSTEMS: Full 14 system revew of systems performed and notable only for:  Drooling, eye discharge, light sensitivity, double vision, blurred vision, cold intolerance, walking difficulty, rash  ALLERGIES: No Known Allergies  HOME MEDICATIONS: Outpatient Prescriptions Prior to Visit  Medication Sig Dispense Refill  . amLODipine (NORVASC) 5 MG tablet Take 10 mg by mouth daily.       Marland Kitchen. aspirin 81 MG tablet Take 81 mg by mouth daily.      Marland Kitchen. atorvastatin (LIPITOR) 10 MG tablet Take 1 tablet by mouth Daily.      Marland Kitchen. lisinopril (PRINIVIL,ZESTRIL) 20 MG tablet       . VIAGRA 100 MG tablet        No facility-administered medications prior to visit.    PHYSICAL EXAM Filed Vitals:   06/22/14 1536  BP: 123/64  Pulse: 79  Height: 5' 10.5" (1.791 m)    Weight: 179 lb 9.6 oz (81.466 kg)   Body mass index is 25.4 kg/(m^2).  General: Pleasant AA male in no distress. Afebrile.  Head: nontraumatic  Ears, Nose and Throat: Hearing is normal.  Neck: supple without bruit  Respiratory: clear to auscultation  Cardiovascular: no murmur or gallop   Neurologic Exam  Mental Status: Awake, alert and oriented to time, place and person. Speech and language appear normal.  Cranial Nerves: Eye movements are full range without nystagmus. Fundi not visualized today. Visual fields are full to confrontational testing. Face is symmetric without weakness. Tongue is midline. Hearing is normal.  Motor: reveals no upper or lower extremity drift. Symmetric and equal strength in all four extremities. No focal weakness. Subtle right hand orbiting over left. Left hand diminished fine motor movement  Sensory: Touch and temperature sensations are normal.  Coordination: normal  Gait and Station: steady gait slight shuffle, unable to tandem walk. Able to stand on toes but difficulty walking on heels.  Reflexes: Deep tendon reflexes are 2+ symmetric.   ASSESSMENT: 80 year old African American male with right cerebral infarct with mass effect compression 4th ventricle and occlusion of distal right vertebral artery with question of dissection secondary to large vessel atherosclerosis leading to right VA occlusion on 01/06/12. Vascular risk factors include hypertension and hyperlipidemia. Doing well, no new neurovascular symptoms.   Plan:  Continue Aspirin 81mg  daily for secondary stroke prevention. Maintain strict control of hypertension with goal blood pressure  below 140/18mmHg and hyperlipidemia with goal LDL's below 100mg %.  Advised to use cane for stability.  Return to clinic in 1 year with Dr. Pearlean Brownie, sooner as needed.  Tawny Asal LAM, MSN, FNP-BC, A/GNP-C 06/22/2014, 4:02 PM [facility] [address] [place], [place] [address] [phone]  Note: This document was prepared with digital dictation and possible smart phrase technology. Any transcriptional errors that result from this process are unintentional.

## 2014-06-22 NOTE — Patient Instructions (Signed)
Plan:  Continue Aspirin 81mg  daily for secondary stroke prevention. Maintain strict control of hypertension with goal blood pressure below 140/48mmHg and hyperlipidemia with goal LDL's below 100mg %.  Advised to use cane for stability.  Return to clinic in 1 year, sooner as needed.  Stroke Prevention Some medical conditions and behaviors are associated with an increased chance of having a stroke. You may prevent a stroke by making healthy choices and managing medical conditions. HOW CAN I REDUCE MY RISK OF HAVING A STROKE?   Stay physically active. Get at least 30 minutes of activity on most or all days.  Do not smoke. It may also be helpful to avoid exposure to secondhand smoke.  Limit alcohol use. Moderate alcohol use is considered to be:  No more than 2 drinks per day for men.  No more than 1 drink per day for nonpregnant women.  Eat healthy foods. This involves:  Eating 5 or more servings of fruits and vegetables a day.  Making dietary changes that address high blood pressure (hypertension), high cholesterol, diabetes, or obesity.  Manage your cholesterol levels.  Making food choices that are high in fiber and low in saturated fat, trans fat, and cholesterol may control cholesterol levels.  Take any prescribed medicines to control cholesterol as directed by your health care provider.  Manage your diabetes.  Controlling your carbohydrate and sugar intake is recommended to manage diabetes.  Take any prescribed medicines to control diabetes as directed by your health care provider.  Control your hypertension.  Making food choices that are low in salt (sodium), saturated fat, trans fat, and cholesterol is recommended to manage hypertension.  Take any prescribed medicines to control hypertension as directed by your health care provider.  Maintain a healthy weight.  Reducing calorie intake and making food choices that are low in sodium, saturated fat, trans fat, and  cholesterol are recommended to manage weight.  Stop drug abuse.  Avoid taking birth control pills.  Talk to your health care provider about the risks of taking birth control pills if you are over 87 years old, smoke, get migraines, or have ever had a blood clot.  Get evaluated for sleep disorders (sleep apnea).  Talk to your health care provider about getting a sleep evaluation if you snore a lot or have excessive sleepiness.  Take medicines only as directed by your health care provider.  For some people, aspirin or blood thinners (anticoagulants) are helpful in reducing the risk of forming abnormal blood clots that can lead to stroke. If you have the irregular heart rhythm of atrial fibrillation, you should be on a blood thinner unless there is a good reason you cannot take them.  Understand all your medicine instructions.  Make sure that other conditions (such as anemia or atherosclerosis) are addressed. SEEK IMMEDIATE MEDICAL CARE IF:   You have sudden weakness or numbness of the face, arm, or leg, especially on one side of the body.  Your face or eyelid droops to one side.  You have sudden confusion.  You have trouble speaking (aphasia) or understanding.  You have sudden trouble seeing in one or both eyes.  You have sudden trouble walking.  You have dizziness.  You have a loss of balance or coordination.  You have a sudden, severe headache with no known cause.  You have new chest pain or an irregular heartbeat. Any of these symptoms may represent a serious problem that is an emergency. Do not wait to see if the symptoms will  go away. Get medical help at once. Call your local emergency services (911 in [place] Do not drive yourself to the hospital. Document Released: 10/18/2004 Document Revised: 01/25/2014 Document Reviewed: 03/13/2013 [facility] Patient Information 2015 [employer] [place]. This information is not intended to replace advice given to you by your health care  provider. Make sure you discuss any questions you have with your health care provider.

## 2014-06-24 NOTE — Progress Notes (Signed)
I agree with the above plan 

## 2014-10-08 ENCOUNTER — Other Ambulatory Visit: Payer: Self-pay | Admitting: Family Medicine

## 2014-10-08 DIAGNOSIS — R209 Unspecified disturbances of skin sensation: Secondary | ICD-10-CM

## 2014-10-08 DIAGNOSIS — I10 Essential (primary) hypertension: Secondary | ICD-10-CM | POA: Diagnosis not present

## 2014-10-08 DIAGNOSIS — E785 Hyperlipidemia, unspecified: Secondary | ICD-10-CM | POA: Diagnosis not present

## 2014-10-08 DIAGNOSIS — I679 Cerebrovascular disease, unspecified: Secondary | ICD-10-CM | POA: Diagnosis not present

## 2014-10-08 DIAGNOSIS — N529 Male erectile dysfunction, unspecified: Secondary | ICD-10-CM | POA: Diagnosis not present

## 2014-10-08 DIAGNOSIS — E1149 Type 2 diabetes mellitus with other diabetic neurological complication: Secondary | ICD-10-CM | POA: Diagnosis not present

## 2014-10-08 DIAGNOSIS — D510 Vitamin B12 deficiency anemia due to intrinsic factor deficiency: Secondary | ICD-10-CM | POA: Diagnosis not present

## 2014-10-13 ENCOUNTER — Ambulatory Visit
Admission: RE | Admit: 2014-10-13 | Discharge: 2014-10-13 | Disposition: A | Discharge status: Home / Self Care | Payer: Medicare PPO | Source: Ambulatory Visit | Attending: Family Medicine | Admitting: Family Medicine

## 2014-10-13 DIAGNOSIS — R209 Unspecified disturbances of skin sensation: Secondary | ICD-10-CM

## 2014-10-14 DIAGNOSIS — D510 Vitamin B12 deficiency anemia due to intrinsic factor deficiency: Secondary | ICD-10-CM | POA: Diagnosis not present

## 2014-10-25 ENCOUNTER — Other Ambulatory Visit: Payer: Self-pay

## 2014-10-25 DIAGNOSIS — I739 Peripheral vascular disease, unspecified: Secondary | ICD-10-CM

## 2014-10-26 ENCOUNTER — Ambulatory Visit (HOSPITAL_COMMUNITY)
Admission: RE | Admit: 2014-10-26 | Discharge: 2014-10-26 | Disposition: A | Discharge status: Home / Self Care | Payer: Medicare PPO | Source: Ambulatory Visit | Attending: Vascular Surgery | Admitting: Vascular Surgery

## 2014-10-26 ENCOUNTER — Ambulatory Visit (INDEPENDENT_AMBULATORY_CARE_PROVIDER_SITE_OTHER): Payer: Medicare PPO | Admitting: Vascular Surgery

## 2014-10-26 ENCOUNTER — Encounter: Payer: Self-pay | Admitting: Vascular Surgery

## 2014-10-26 VITALS — BP 151/68 | HR 80 | Ht 70.5 in | Wt 177.2 lb

## 2014-10-26 DIAGNOSIS — I739 Peripheral vascular disease, unspecified: Secondary | ICD-10-CM | POA: Diagnosis not present

## 2014-10-26 NOTE — Progress Notes (Addendum)
HISTORY AND PHYSICAL     CC:  Cold left foot Referring Provider:  Ileana LaddWong, Francis P, MD  HPI: This is a 81 y.o. male who presents today with complaints of his left foot being cold most of the time.  He states that he also has pain on the bottom of his foot.  This all started after his stroke a couple of years ago.  He states that he really does not have any residual symptoms from his stroke except for being a little unsteady of his feet.  He denies any claudication type symptoms when he is walking.  He does not have any pain or coolness in his right foot.    He had ABI's performed at an outside facility, which were 1.3 on the right and 1.1 on the left.    He has a hx of being pre-diabetic per the pt, HTN, BPH with an elevated PSA, psoriasis, pernicious anemia and a tubulovillous adenoma with high grade dysplasia on colonoscopy that is followed by Dr. Ewing SchleinMagod.    He does take a statin for his cerebral vascular disease and is also on an aspirin.  He takes Vitamin B12 injections for his pernicious anemia.  He is on an ACEI for his hypertension.  He quit smoking about 20 years ago.    Past Medical History  Diagnosis Date  . Hypertension   . Stroke   . Hyperlipidemia     History reviewed. No pertinent past surgical history.  No Known Allergies  Current Outpatient Prescriptions  Medication Sig Dispense Refill  . amLODipine (NORVASC) 5 MG tablet Take 10 mg by mouth daily.     Marland Kitchen. aspirin 81 MG tablet Take 81 mg by mouth daily.    Marland Kitchen. atorvastatin (LIPITOR) 10 MG tablet Take 1 tablet by mouth Daily.    Marland Kitchen. LEVITRA 20 MG tablet     . lisinopril (PRINIVIL,ZESTRIL) 20 MG tablet     . VIAGRA 100 MG tablet      No current facility-administered medications for this visit.    Family History  Problem Relation Age of Onset  . Diabetes Sister   . Heart attack Sister     History   Social History  . Marital Status: Married    Spouse Name: [name]    Number of Children: 4  . Years of Education: HS    Occupational History  . Retired     [employer]   Social History Main Topics  . Smoking status: Former Smoker    Types: Cigarettes    Quit date: 04/02/1967  . Smokeless tobacco: Never Used  . Alcohol Use: No  . Drug Use: No  . Sexual Activity: Not on file   Other Topics Concern  . Not on file   Social History Narrative   Patient lives at home with spouse. [name](Ethel)   Patient has four children.   Patient is retired.   Patient has a high school education.   Caffeine Use: 1-2 cups daily     ROS: [x]  Positive   [ ]  Negative   [ ]  All sytems reviewed and are negative  Cardiovascular: []  chest pain/pressure []  palpitations []  SOB lying flat []  DOE [x]  pain in left foot while walking [x]  left foot feels cold []  pain in feet when lying flat []  hx of DVT []  hx of phlebitis []  swelling in legs []  varicose veins  Pulmonary: []  productive cough []  asthma []  wheezing  Neurologic: []  weakness in []  arms []  legs []  numbness  in  arms  legs difficulty speaking or slurred speech  temporary loss of vision in one eye  dizziness  Hematologic:  bleeding problems  problems with blood clotting easily  GI  vomiting blood  blood in stool  GU:  burning with urination  blood in urine  Psychiatric:  hx of major depression  Integumentary:  rashes  ulcers  psoriasis   Constitutional:  fever  chills   PHYSICAL EXAMINATION:  Filed Vitals:   10/26/14 1322  BP: 151/68  Pulse: 80   Body mass index is 25.06 kg/(m^2).  General:  WDWN in NAD Gait: Not observed HENT: WNL, normocephalic Pulmonary: normal non-labored breathing , without Rales, rhonchi,  wheezing Cardiac: RRR, without  Murmurs, rubs or gallops; without carotid bruits Abdomen: soft, NT, no masses Skin: without rashes, without ulcers  Vascular Exam/Pulses:  Right Left  Radial 2+ (normal) 2+ (normal)  Femoral 3+ (hyperdynamic) 2+ (normal)  DP 2+ (normal) 2+ (normal)  PT  Unable to palpate  Unable to palpate    Extremities: without ischemic changes, without Gangrene , without cellulitis; without open wounds;  Musculoskeletal: no muscle wasting or atrophy  Neurologic: A&O X 3; Appropriate Affect ; SENSATION: normal; MOTOR FUNCTION:  moving all extremities equally. Speech is fluent/normal   Non-Invasive Vascular Imaging:   ABI's at outside facility 10/13/14: Right:  1.3 Left:  1.1  Aorto-iliac duplex evaluation 10/26/14: Right CFA with biphasic/triphasic waveforms 121cm/s Left CFA with biphasic waveforms 82cm/s  No stenosis visualized.     Pt meds includes: Statin:  Yes.   Beta Blocker:  No. Aspirin:  Yes.   ACEI:  Yes.   ARB:  No. Other Antiplatelet/Anticoagulant:  No.    ASSESSMENT/PLAN:: 81 y.o. male with complaints of cold left foot with pain on the plantar surface   -pt has a slightly diminished left femoral pulse and waveforms on the left are biphasic on duplex evaluation with ABI > 1.  Dr. Hart Rochester does not feel his symptoms are from his iliac disease.  He does have palpable DP pulses bilaterally and sensation and motor are in tact.  His feeling of coldness is most likely due to neuropathy. -he will f/u with [place] as needed.   Doreatha Massed, PA-C [facility] [phone]  Clinic MD:  Pt seen and examined in conjunction with Dr. 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 Patient does not have severe claudication or limb threatening ischemia and no indication for further evaluation or treatment of arterial system

## 2014-11-15 DIAGNOSIS — D510 Vitamin B12 deficiency anemia due to intrinsic factor deficiency: Secondary | ICD-10-CM | POA: Diagnosis not present

## 2014-12-13 DIAGNOSIS — D510 Vitamin B12 deficiency anemia due to intrinsic factor deficiency: Secondary | ICD-10-CM | POA: Diagnosis not present

## 2014-12-29 DIAGNOSIS — I739 Peripheral vascular disease, unspecified: Secondary | ICD-10-CM | POA: Diagnosis not present

## 2014-12-29 DIAGNOSIS — E785 Hyperlipidemia, unspecified: Secondary | ICD-10-CM | POA: Diagnosis not present

## 2014-12-29 DIAGNOSIS — Z0000 Encounter for general adult medical examination without abnormal findings: Secondary | ICD-10-CM | POA: Diagnosis not present

## 2014-12-29 DIAGNOSIS — R209 Unspecified disturbances of skin sensation: Secondary | ICD-10-CM | POA: Diagnosis not present

## 2014-12-29 DIAGNOSIS — D510 Vitamin B12 deficiency anemia due to intrinsic factor deficiency: Secondary | ICD-10-CM | POA: Diagnosis not present

## 2014-12-29 DIAGNOSIS — N529 Male erectile dysfunction, unspecified: Secondary | ICD-10-CM | POA: Diagnosis not present

## 2014-12-29 DIAGNOSIS — I10 Essential (primary) hypertension: Secondary | ICD-10-CM | POA: Diagnosis not present

## 2014-12-29 DIAGNOSIS — I679 Cerebrovascular disease, unspecified: Secondary | ICD-10-CM | POA: Diagnosis not present

## 2015-01-14 DIAGNOSIS — D510 Vitamin B12 deficiency anemia due to intrinsic factor deficiency: Secondary | ICD-10-CM | POA: Diagnosis not present

## 2015-01-26 DIAGNOSIS — I10 Essential (primary) hypertension: Secondary | ICD-10-CM | POA: Diagnosis not present

## 2015-01-26 DIAGNOSIS — I739 Peripheral vascular disease, unspecified: Secondary | ICD-10-CM | POA: Diagnosis not present

## 2015-01-26 DIAGNOSIS — E539 Vitamin B deficiency, unspecified: Secondary | ICD-10-CM | POA: Diagnosis not present

## 2015-01-26 DIAGNOSIS — R209 Unspecified disturbances of skin sensation: Secondary | ICD-10-CM | POA: Diagnosis not present

## 2015-01-26 DIAGNOSIS — E785 Hyperlipidemia, unspecified: Secondary | ICD-10-CM | POA: Diagnosis not present

## 2015-01-26 DIAGNOSIS — I679 Cerebrovascular disease, unspecified: Secondary | ICD-10-CM | POA: Diagnosis not present

## 2015-01-26 DIAGNOSIS — R7301 Impaired fasting glucose: Secondary | ICD-10-CM | POA: Diagnosis not present

## 2015-02-14 DIAGNOSIS — D510 Vitamin B12 deficiency anemia due to intrinsic factor deficiency: Secondary | ICD-10-CM | POA: Diagnosis not present

## 2015-03-05 DIAGNOSIS — T2220XA Burn of second degree of shoulder and upper limb, except wrist and hand, unspecified site, initial encounter: Secondary | ICD-10-CM | POA: Diagnosis not present

## 2015-03-18 DIAGNOSIS — D510 Vitamin B12 deficiency anemia due to intrinsic factor deficiency: Secondary | ICD-10-CM | POA: Diagnosis not present

## 2015-04-15 DIAGNOSIS — D510 Vitamin B12 deficiency anemia due to intrinsic factor deficiency: Secondary | ICD-10-CM | POA: Diagnosis not present

## 2015-05-16 DIAGNOSIS — D510 Vitamin B12 deficiency anemia due to intrinsic factor deficiency: Secondary | ICD-10-CM | POA: Diagnosis not present

## 2015-06-16 DIAGNOSIS — D510 Vitamin B12 deficiency anemia due to intrinsic factor deficiency: Secondary | ICD-10-CM | POA: Diagnosis not present

## 2015-06-23 ENCOUNTER — Encounter: Payer: Self-pay | Admitting: Neurology

## 2015-06-23 ENCOUNTER — Ambulatory Visit (INDEPENDENT_AMBULATORY_CARE_PROVIDER_SITE_OTHER): Payer: Medicare PPO | Admitting: Neurology

## 2015-06-23 VITALS — BP 144/75 | HR 82 | Ht 70.0 in | Wt 176.4 lb

## 2015-06-23 DIAGNOSIS — I6990 Unspecified sequelae of unspecified cerebrovascular disease: Secondary | ICD-10-CM | POA: Diagnosis not present

## 2015-06-23 NOTE — Progress Notes (Signed)
PATIENT: Shane Juarez DOB: [birth year]  REASON FOR VISIT: routine follow up for stroke HISTORY FROM: patient  HISTORY OF PRESENT ILLNESS: 81 year old African American male with right cerebral infarct on 01/06/12. He presented to [facility] on [facility]/06/12 with a 3 day history of difficulty walking, he also had noted dysphagia. CT of head showed acute right cerebellar infarct with mass effect compression 4th ventricle. MRA of brain showed right PICA infarct with petechial hemorrhage, severe cerebellar edema with mass effect, occlusion of distal right vertebral artery with question of dissection secodary to large vessel atherosclerosis leading to right VA occlusion.  Shane Juarez returns for stroke follow up, doing well, no new neurovascular symptoms.  Tolerating ASA 81 mg well, without bruising or bleeing. Denies any falls. Has new PCP, now seeing Dr. Modesto Charon. Went for routine checkup last month, was told all labs were good, no changes in medications were made. Blood pressure is well controlled, it is 123/64 in the office today. Last carotid doppler was in March 2015, showed moderate left ICA stenosis, unchanged from previous year. He has no new complaints. Update 06/23/2015 ; he returns for follow-up after last visit 1 year ago accompanied by his wife. He continues to do well without recurrent stroke and TIA symptoms now for more than 3-1/2 years. He remains on aspirin which is tolerating well without bleeding or bruising. He states his blood pressure is well controlled today it is borderline at 144/7 fine office. He is tolerating his blood pressure medications as well as Lipitor without any side effects. He has no complaints today. REVIEW OF SYSTEMS: Full 14 system revew of systems performed and notable only for:   Drooling, eye discharge, light sensitivity, eye pain, blurred vision, constipation and all other systems negative  ALLERGIES: No Known Allergies  HOME MEDICATIONS: Outpatient Prescriptions Prior to  Visit  Medication Sig Dispense Refill  . amLODipine (NORVASC) 5 MG tablet Take 10 mg by mouth daily.     Marland Kitchen aspirin 81 MG tablet Take 81 mg by mouth daily.    Marland Kitchen atorvastatin (LIPITOR) 10 MG tablet Take 1 tablet by mouth Daily.    Marland Kitchen lisinopril (PRINIVIL,ZESTRIL) 20 MG tablet     . LEVITRA 20 MG tablet     . VIAGRA 100 MG tablet      No facility-administered medications prior to visit.    PHYSICAL EXAM Filed Vitals:   06/23/15 1502  BP: 144/75  Pulse: 82  Height:  (1.778 m)  Weight: 176 lb 6.4 oz (80.015 kg)   Body mass index is 25.31 kg/(m^2).  General: Pleasant AA male in no distress. Afebrile.  Head: nontraumatic  Ears, Nose and Throat: Hearing is normal.  Neck: supple without bruit  Respiratory: clear to auscultation  Cardiovascular: no murmur or gallop   Neurologic Exam  Mental Status: Awake, alert and oriented to time, place and person. Speech and language appear normal.  Cranial Nerves: Eye movements are full range without nystagmus. Fundi not visualized today. Visual fields are full to confrontational testing. Face is symmetric without weakness. Tongue is midline. Hearing is normal.  Motor: reveals no upper or lower extremity drift. Symmetric and equal strength in all four extremities. No focal weakness. Subtle right hand orbiting over left. Left hand diminished fine motor movement  Sensory: Touch and temperature sensations are normal.  Coordination: normal  Gait and Station: steady gait slight shuffle, unable to tandem walk. Able to stand on toes but difficulty walking on heels.  Reflexes: Deep  tendon reflexes are 2+ symmetric.   ASSESSMENT: 81 year old African American male with right cerebral infarct with mass effect compression 4th ventricle and occlusion of distal right vertebral artery with question of dissection secondary to large vessel atherosclerosis leading to right VA occlusion on 01/06/12. Vascular risk factors include hypertension and hyperlipidemia.  Doing well, no new neurovascular symptoms.   Plan:  I had a long d/w patient and wife  about his remote stroke, risk for recurrent stroke/TIAs, personally independently reviewed imaging studies and stroke evaluation results and answered questions.Continue aspirin 81 mg orally every day  for secondary stroke prevention and maintain strict control of hypertension with blood pressure goal below 130/90, and lipids with LDL cholesterol goal below 100 mg/dL. I also advised the patient to eat a healthy diet with plenty of whole grains, cereals, fruits and vegetables, exercise regularly and maintain ideal body weight since patient has been free of recurrent neurovascular symptoms for 3-1/2 years I do not believe routine scheduled follow-up appointment with me is necessary. Greater than 50% time during this 25 minute visit was spent on counseling and coordination of care. He may however be referred back by his primary physician as needed    Delia Heady, MD  06/23/2015, 3:42 PM [facility] [address], [address] [place], [place] [address] [phone]  Note: This document was prepared with digital dictation and possible smart phrase technology. Any transcriptional errors that result from this process are unintentional.

## 2015-06-23 NOTE — Patient Instructions (Signed)
I had a long d/w patient and wife  about his remote stroke, risk for recurrent stroke/TIAs, personally independently reviewed imaging studies and stroke evaluation results and answered questions.Continue aspirin 81 mg orally every day  for secondary stroke prevention and maintain strict control of hypertension with blood pressure goal below 130/90, and lipids with LDL cholesterol goal below 100 mg/dL. I also advised the patient to eat a healthy diet with plenty of whole grains, cereals, fruits and vegetables, exercise regularly and maintain ideal body weight since patient has been free of recurrent neurovascular symptoms for 3-1/2 years I do not believe routine scheduled follow-up appointment with me is necessary. He may however be referred back by his primary physician as needed

## 2015-07-15 DIAGNOSIS — D510 Vitamin B12 deficiency anemia due to intrinsic factor deficiency: Secondary | ICD-10-CM | POA: Diagnosis not present

## 2015-07-29 DIAGNOSIS — I739 Peripheral vascular disease, unspecified: Secondary | ICD-10-CM | POA: Diagnosis not present

## 2015-07-29 DIAGNOSIS — I10 Essential (primary) hypertension: Secondary | ICD-10-CM | POA: Diagnosis not present

## 2015-07-29 DIAGNOSIS — R7301 Impaired fasting glucose: Secondary | ICD-10-CM | POA: Diagnosis not present

## 2015-07-29 DIAGNOSIS — E785 Hyperlipidemia, unspecified: Secondary | ICD-10-CM | POA: Diagnosis not present

## 2015-07-29 DIAGNOSIS — K4020 Bilateral inguinal hernia, without obstruction or gangrene, not specified as recurrent: Secondary | ICD-10-CM | POA: Diagnosis not present

## 2015-07-29 DIAGNOSIS — D510 Vitamin B12 deficiency anemia due to intrinsic factor deficiency: Secondary | ICD-10-CM | POA: Diagnosis not present

## 2015-07-29 DIAGNOSIS — I679 Cerebrovascular disease, unspecified: Secondary | ICD-10-CM | POA: Diagnosis not present

## 2015-07-29 DIAGNOSIS — N529 Male erectile dysfunction, unspecified: Secondary | ICD-10-CM | POA: Diagnosis not present

## 2015-08-11 DIAGNOSIS — K4020 Bilateral inguinal hernia, without obstruction or gangrene, not specified as recurrent: Secondary | ICD-10-CM | POA: Diagnosis not present

## 2015-08-15 DIAGNOSIS — D510 Vitamin B12 deficiency anemia due to intrinsic factor deficiency: Secondary | ICD-10-CM | POA: Diagnosis not present

## 2015-09-14 DIAGNOSIS — D510 Vitamin B12 deficiency anemia due to intrinsic factor deficiency: Secondary | ICD-10-CM | POA: Diagnosis not present

## 2018-08-09 ENCOUNTER — Other Ambulatory Visit: Payer: Self-pay

## 2018-08-09 ENCOUNTER — Observation Stay (HOSPITAL_BASED_OUTPATIENT_CLINIC_OR_DEPARTMENT_OTHER)
Admission: EM | Admit: 2018-08-09 | Discharge: 2018-08-10 | Disposition: A | Discharge status: Home Health | Payer: Medicare Other | Attending: Internal Medicine | Admitting: Internal Medicine

## 2018-08-09 ENCOUNTER — Emergency Department (HOSPITAL_BASED_OUTPATIENT_CLINIC_OR_DEPARTMENT_OTHER): Payer: Medicare Other

## 2018-08-09 ENCOUNTER — Encounter (HOSPITAL_BASED_OUTPATIENT_CLINIC_OR_DEPARTMENT_OTHER): Payer: Self-pay | Admitting: Emergency Medicine

## 2018-08-09 DIAGNOSIS — Z79899 Other long term (current) drug therapy: Secondary | ICD-10-CM | POA: Diagnosis not present

## 2018-08-09 DIAGNOSIS — E785 Hyperlipidemia, unspecified: Secondary | ICD-10-CM | POA: Diagnosis present

## 2018-08-09 DIAGNOSIS — N183 Chronic kidney disease, stage 3 (moderate): Secondary | ICD-10-CM | POA: Diagnosis not present

## 2018-08-09 DIAGNOSIS — Z7982 Long term (current) use of aspirin: Secondary | ICD-10-CM | POA: Insufficient documentation

## 2018-08-09 DIAGNOSIS — I70209 Unspecified atherosclerosis of native arteries of extremities, unspecified extremity: Secondary | ICD-10-CM | POA: Diagnosis not present

## 2018-08-09 DIAGNOSIS — Z9181 History of falling: Secondary | ICD-10-CM | POA: Diagnosis not present

## 2018-08-09 DIAGNOSIS — Z87891 Personal history of nicotine dependence: Secondary | ICD-10-CM | POA: Diagnosis not present

## 2018-08-09 DIAGNOSIS — N179 Acute kidney failure, unspecified: Secondary | ICD-10-CM | POA: Diagnosis not present

## 2018-08-09 DIAGNOSIS — R2689 Other abnormalities of gait and mobility: Secondary | ICD-10-CM | POA: Diagnosis not present

## 2018-08-09 DIAGNOSIS — I129 Hypertensive chronic kidney disease with stage 1 through stage 4 chronic kidney disease, or unspecified chronic kidney disease: Secondary | ICD-10-CM | POA: Diagnosis not present

## 2018-08-09 DIAGNOSIS — R55 Syncope and collapse: Secondary | ICD-10-CM | POA: Diagnosis present

## 2018-08-09 DIAGNOSIS — G9341 Metabolic encephalopathy: Secondary | ICD-10-CM | POA: Diagnosis not present

## 2018-08-09 DIAGNOSIS — R4182 Altered mental status, unspecified: Secondary | ICD-10-CM

## 2018-08-09 DIAGNOSIS — R2681 Unsteadiness on feet: Secondary | ICD-10-CM | POA: Insufficient documentation

## 2018-08-09 DIAGNOSIS — G9389 Other specified disorders of brain: Secondary | ICD-10-CM | POA: Insufficient documentation

## 2018-08-09 DIAGNOSIS — I10 Essential (primary) hypertension: Secondary | ICD-10-CM | POA: Diagnosis present

## 2018-08-09 DIAGNOSIS — Z8673 Personal history of transient ischemic attack (TIA), and cerebral infarction without residual deficits: Secondary | ICD-10-CM | POA: Insufficient documentation

## 2018-08-09 DIAGNOSIS — I639 Cerebral infarction, unspecified: Secondary | ICD-10-CM | POA: Diagnosis present

## 2018-08-09 LAB — DIFFERENTIAL
Abs Immature Granulocytes: 0.04 10*3/uL (ref 0.00–0.07)
Basophils Absolute: 0.1 10*3/uL (ref 0.0–0.1)
Basophils Relative: 1 %
Eosinophils Absolute: 0.1 10*3/uL (ref 0.0–0.5)
Eosinophils Relative: 2 %
Immature Granulocytes: 1 %
Lymphocytes Relative: 36 %
Lymphs Abs: 2.6 10*3/uL (ref 0.7–4.0)
Monocytes Absolute: 0.8 10*3/uL (ref 0.1–1.0)
Monocytes Relative: 11 %
Neutro Abs: 3.5 10*3/uL (ref 1.7–7.7)
Neutrophils Relative %: 49 %

## 2018-08-09 LAB — COMPREHENSIVE METABOLIC PANEL
ALT: 12 U/L (ref 0–44)
AST: 18 U/L (ref 15–41)
Albumin: 3.6 g/dL (ref 3.5–5.0)
Alkaline Phosphatase: 76 U/L (ref 38–126)
Anion gap: 7 (ref 5–15)
BUN: 20 mg/dL (ref 8–23)
CO2: 25 mmol/L (ref 22–32)
Calcium: 8.4 mg/dL — ABNORMAL LOW (ref 8.9–10.3)
Chloride: 105 mmol/L (ref 98–111)
Creatinine, Ser: 1.4 mg/dL — ABNORMAL HIGH (ref 0.61–1.24)
GFR calc Af Amer: 51 mL/min — ABNORMAL LOW (ref >60)
GFR calc non Af Amer: 44 mL/min — ABNORMAL LOW (ref >60)
Glucose, Bld: 142 mg/dL — ABNORMAL HIGH (ref 70–99)
Potassium: 4.3 mmol/L (ref 3.5–5.1)
Sodium: 137 mmol/L (ref 135–145)
Total Bilirubin: 0.6 mg/dL (ref 0.3–1.2)
Total Protein: 6.9 g/dL (ref 6.5–8.1)

## 2018-08-09 LAB — RAPID URINE DRUG SCREEN, HOSP PERFORMED
Amphetamines: NOT DETECTED
Barbiturates: NOT DETECTED
Benzodiazepines: NOT DETECTED
Cocaine: NOT DETECTED
Opiates: NOT DETECTED
Tetrahydrocannabinol: NOT DETECTED

## 2018-08-09 LAB — CBC
HCT: 34.4 % — ABNORMAL LOW (ref 39.0–52.0)
Hemoglobin: 11.1 g/dL — ABNORMAL LOW (ref 13.0–17.0)
MCH: 32.2 pg (ref 26.0–34.0)
MCHC: 32.3 g/dL (ref 30.0–36.0)
MCV: 99.7 fL (ref 80.0–100.0)
Platelets: 205 10*3/uL (ref 150–400)
RBC: 3.45 MIL/uL — ABNORMAL LOW (ref 4.22–5.81)
RDW: 14.7 % (ref 11.5–15.5)
WBC: 7.1 10*3/uL (ref 4.0–10.5)
nRBC: 0 % (ref 0.0–0.2)

## 2018-08-09 LAB — URINALYSIS, ROUTINE W REFLEX MICROSCOPIC
Bilirubin Urine: NEGATIVE
Glucose, UA: NEGATIVE mg/dL
Hgb urine dipstick: NEGATIVE
Ketones, ur: NEGATIVE mg/dL
Leukocytes, UA: NEGATIVE
Nitrite: NEGATIVE
Protein, ur: NEGATIVE mg/dL
Specific Gravity, Urine: 1.016 (ref 1.005–1.030)
pH: 6 (ref 5.0–8.0)

## 2018-08-09 LAB — MAGNESIUM: Magnesium: 2.2 mg/dL (ref 1.7–2.4)

## 2018-08-09 LAB — ETHANOL: Alcohol, Ethyl (B): <10 mg/dL (ref <10)

## 2018-08-09 LAB — PROTIME-INR
INR: 1.04
Prothrombin Time: 13.5 seconds (ref 11.4–15.2)

## 2018-08-09 LAB — TROPONIN I: Troponin I: <0.03 ng/mL (ref <0.03)

## 2018-08-09 LAB — APTT: aPTT: 23 seconds — ABNORMAL LOW (ref 24–36)

## 2018-08-09 NOTE — ED Notes (Addendum)
Patient transported to CT, with nurse on monitor

## 2018-08-09 NOTE — ED Triage Notes (Signed)
Patient had AMS started at around 1230-1300 today. Right grip strength is weak.

## 2018-08-09 NOTE — ED Notes (Signed)
Tele Stroke Consult in progress

## 2018-08-09 NOTE — ED Notes (Signed)
ED Provider at bedside. 

## 2018-08-09 NOTE — ED Notes (Signed)
CBG was 136. RN and PA aware.

## 2018-08-09 NOTE — ED Provider Notes (Signed)
[facility] EMERGENCY DEPARTMENT Provider Note   CSN: [id number] Arrival date & time: 08/09/18  1513     History   Chief Complaint Chief Complaint  Patient presents with  . Altered Mental Status    HPI Shane COINER is a 84 y.o. male presenting for evaluation of multiple altered mental status.  Per wife, patient started acting altered around 1230 1:00 today.  While doing the grocery store, he had an episode where he fell to the ground.  He was acutely altered, could only say his name and birthday.  Additional history obtained from chart review, patient with a history of a CVA in 2013.  No documented neuro deficits when neurologic notes notes from 2016 were reviewed.   HPI  Past Medical History:  Diagnosis Date  . Hyperlipidemia   . Hypertension   . Stroke     Patient Active Problem List   Diagnosis Date Noted  . 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 CVA (cerebral vascular accident) (HCC) 01/06/2012  . HTN (hypertension) 01/06/2012    No past surgical history on file.      Home Medications    Prior to Admission medications   Medication Sig Start Date End Date Taking? Authorizing Provider  amLODipine (NORVASC) 5 MG tablet Take 10 mg by mouth daily.     [provider]  aspirin 81 MG tablet Take 81 mg by mouth daily.    [provider]  atorvastatin (LIPITOR) 10 MG tablet Take 1 tablet by mouth Daily. 03/31/12   Catha Gosselin, MD  lisinopril (PRINIVIL,ZESTRIL) 20 MG tablet  02/22/12   [provider]    Family History Family History  Problem Relation Age of Onset  . Diabetes Sister   . Heart attack Sister     Social History Social History   Tobacco Use  . Smoking status: Former Smoker    Types: Cigarettes    Last attempt to quit: 04/02/1967    Years since quitting: 51.3  . Smokeless tobacco: Never Used    Substance Use Topics  . Alcohol use: No    Alcohol/week: 0.0 standard drinks  . Drug use: No     Allergies   Patient has no known allergies.   Review of Systems Review of Systems  Unable to perform ROS: Acuity of condition  Neurological: Positive for weakness.       Altered per family     Physical Exam Updated Vital Signs There were no vitals taken for this visit.  Physical Exam  Constitutional: He appears well-developed and well-nourished. No distress.  Elderly male appears nontoxic  HENT:  Head: Normocephalic and atraumatic.  Eyes: Pupils are equal, round, and reactive to light. Conjunctivae and EOM are normal.  PERRLA  Neck: Normal range of motion. Neck supple.  Cardiovascular: Normal rate, regular rhythm and intact distal pulses.  Pulmonary/Chest: Effort normal and breath sounds normal. No respiratory distress. He has no wheezes.  Abdominal: Soft. He exhibits no distension and no mass. There is no tenderness. There is no guarding.  Musculoskeletal: Normal range of motion.  Lower extremity strength intact bilaterally.  Right grip strength deficit.  No pronator drift. No obvious facial droop  Neurological: He is alert. No sensory deficit. GCS eye subscore is 4. GCS verbal subscore is 5. GCS motor subscore is 6.  Decreased R grip strength. Alert to person and place.   Skin: Skin is warm and  dry. Capillary refill takes less than 2 seconds.  Psychiatric: He has a normal mood and affect.  Nursing note and vitals reviewed.    ED Treatments / Results  Labs (all labs ordered are listed, but only abnormal results are displayed) Labs Reviewed  ETHANOL  PROTIME-INR  APTT  CBC  DIFFERENTIAL  COMPREHENSIVE METABOLIC PANEL  TROPONIN I  RAPID URINE DRUG SCREEN, HOSP PERFORMED  URINALYSIS, ROUTINE W REFLEX MICROSCOPIC    EKG None  Radiology No results found.  Procedures .Critical Care Performed by: Alveria Apleyaccavale, Sophia, PA-C Authorized by: Alveria Apleyaccavale, Sophia, PA-C    Critical care provider statement:    Critical care time (minutes):  40   Critical care time was exclusive of:  Separately billable procedures and treating other patients and teaching time   Critical care was necessary to treat or prevent imminent or life-threatening deterioration of the following conditions:  CNS failure or compromise   Critical care was time spent personally by me on the following activities:  Blood draw for specimens, development of treatment plan with patient or surrogate, discussions with consultants, evaluation of patient's response to treatment, examination of patient, obtaining history from patient or surrogate, ordering and performing treatments and interventions, ordering and review of laboratory studies, ordering and review of radiographic studies, re-evaluation of patient's condition, pulse oximetry and review of old charts   I assumed direction of critical care for this patient from another provider in my specialty: no   Comments:     Pt presenting with AMS and focal neuro deficits. Code stroke called. Pt admitted for further work up.    (including critical care time)  Medications Ordered in ED Medications - No data to display   Initial Impression / Assessment and Plan / ED Course  I have reviewed the triage vital signs and the nursing notes.  Pertinent labs & imaging results that were available during my care of the patient were reviewed by me and considered in my medical decision making (see chart for details).     Patient presenting for near syncopal episode and altered mental status.  Initial exam concerning, patient with decreased grip strength on the right.  As patient had acute onset confusion, weakness, and has focal neuro deficit, will call code stroke.  On repeat, patient grip strength has returned and is now equal.  Patient continues to become less confused as he is in the ED.  Tele-neuro in progress.  Discussed with tele-neurologist, who does not  recommend TPA at this time.  Recommends admission to the hospital for TIA versus seizure work-up, including MRI, EEG, and echo. Labs reassuring, no leukocytosis.  Hemoglobin stable.  Creatinine mildly elevated from baseline at 1.4, although most recent labs are from 6 years ago.  Otherwise, electrolytes stable.  Discussed with Dr. Oliva BustardAuroor from neurology, agrees with admission and will follow while in the hospital.  Discussed with Dr. Thedore MinsSingh [facility] hospitalist service, patient to be admitted.  Will be transferred with [facility].   Final Clinical Impressions(s) / ED Diagnoses   Final diagnoses:  Altered mental status, unspecified altered mental status type  Near syncope    ED Discharge Orders    None       Alveria ApleyCaccavale, Sophia, PA-C 08/09/18 2000    [place], Adam, DO 08/09/18 2356

## 2018-08-09 NOTE — Consult Note (Signed)
[facility] TeleNeurology Consult Services   TeleStroke Metrics: LKW: [phone] Door Time: 1513  [facility] Contacted: 1529  [facility] at Bedside: 1537 NIHSS: 1541 Decision on Alteplase: Not to give as the patient's NIH stroke scale score is currently 0. Interventional Candidate: Not a candidate as his symptoms are not consistent with a large vessel proximal occlusion.   Chief Complaint: Altered mental status and weakness   HPI: Asked to see this patient in emergent telemedicine consultation utilizing interactive audio and video technologies. ?Consultation was performed with assistance of ancillary / medical staff at bedside.   Verbal consent to perform the examination with telemedicine was obtained. Patient agreed to proceed with the consultation for acute stroke protocol.  84 year old right-handed African-American male who comes to the emergency room with his wife for an episode of weakness and altered mental status.  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  He is currently on a baby aspirin.  Apparently, the patient was out shopping with his wife.  Sometime around 1445, he suddenly became weak and had to sit down.  Wife noted that he fell forward but he did not pass out.  Patient recalls feeling like he was going to fall asleep.  At some point, the patient was not speaking.  Patient became acutely confused and had poor memory of what happened.  While in the ER, they noted possible decreased right grip strength.  However, as time passed, the patient became more alert and interactive and has no obvious focal motor deficits.  Wife states the patient has never done this before.  Currently on my examination, patient had no focal motor or sensory deficits.  No aphasia.  He states he is in the hospital but does not identify the year.  He is able to tell [place] his date of birth and is able to recognize his wife.   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  Patient smoked before in the past.  He is married.   FMH: Negative for stroke.   ROS: 13 point review systems were reviewed with the patient, and are all negative with the exception of the aforementioned in the history of present illness.   VS: Temperature is 97.9 F, pulse 92, respiration 14, blood pressure 159/75, oxygen saturation 90%   Exam: Patient is in no apparent distress.  Patient appears as stated age.  No obvious acute respiratory or cardiac distress.  Patient is well groomed and well-nourished. 1a- LOC: Keenly responsive - 0     1b- LOC questions: Answers both questions correctly - 0    1c- LOC commands- Performs both tasks correctly- 0    2- Gaze: Normal; no gaze paresis or gaze deviation - 0    3- Visual Fields: normal, no Visual field deficit - 0    4- Facial movements: no facial palsy - 0    5- Upper limb motor - no drift - 0    6- Lower limb motor - no drift - 0     7- Limb Coordination: absent ataxia - 0     8- Sensory: no sensory loss - 0     9- Language - No aphasia - 0     10- Speech - No dysarthria -0    11- Neglect / Extinction - none found - 0   NIHSS score: 0    Diagnostic Data: CT of  the head showed no acute intracranial process   Medical Data Reviewed:   1.Data?reviewed include clinical labs, radiology,?and medical tests;   2.Tests?results discussed w/performing or interpreting physician;   3.Obtaining/reviewing old medical records;   4.Obtaining?case history from another source;   5.Independent?review of image, tracing, or specimen.    Medical Decision Making:   - Extensive number of diagnosis or management options are considered below.   - Extensive amount of complex data reviewed.   - High risk of complication and/or morbidity or mortality are associated with differential diagnostic considerations below.   - There may  be?uncertain?outcome and increased probability of prolonged functional impairment or high probability of severe prolonged functional impairment associated with some of these differential diagnosis.    Differential Diagnosis for Stroke:   1.?Cardioembolic?stroke   2. Small vessel disease/lacune   3. Thromboembolic, artery-to-artery mechanism   4.?Hypercoagulable?state-related infarct   5. Transient ischemic attack   6. Thrombotic mechanism, large artery disease    Assessment: 1.  Altered mental status with generalized weakness 2.  Prior right cerebellar stroke 3.  Peripheral arterial disease 4.  Hypertension   Recommendations: Patient can be admitted to the hospital for further work-up of his symptoms. Metabolic, infectious, and cardiac work-up per primary team. Consult local neurology team to assist with evaluation and management. Maintain the patient on his baby aspirin for now. Allow permissive hypertension. Check MRI brain without contrast to rule out any acute intracranial process. Check MRA of the head and neck to evaluate his intracranial and extracranial blood vessels. Check echocardiogram to gauge his cardiac function. Maintain the patient on telemetry to look for paroxysmal atrial fibrillation. Check hemoglobin A1c and lipid panel. Consult PT, OT, and ST. To consider an EEG to rule out subclinical seizures. Continue supportive care.   Thank you for allowing [facility] to participate in the care of your patient. Please call me, Dr. Adrienne Mocha, with any questions at [phone]. Case discussed with the ER staff and the ER team.   Critical Care notation:   I was called to see this critical patient emergently. I personally evaluated this critical patient for acute stroke evaluation, and determining their eligibility for IV Alteplase and interventional therapies.  I have spent approximately 9 minutes with the patient, including time at bedside, time discussing the case  with other physicians, reviewing plan of care, and time independently reviewing the records and scans.

## 2018-08-09 NOTE — ED Provider Notes (Signed)
Medical screening examination/treatment/procedure(s) were conducted as a shared visit with non-physician practitioner(s) and myself.  I personally evaluated the patient during the encounter. Briefly, the patient is a 84 y.o. male with history of stroke, hypertension, high cholesterol who presents to the ED with right upper extremity weakness that started about 2 hours ago.  Patient made a code stroke upon arrival.  Upon my evaluation patient has had improvement of his deficits.  Neurologically intact.  Patient was primarily seen by physician assistant and patient was ready evaluated by telemetry neurology.  Patient appears to be back at his baseline.  Family is at the bedside who also states that patient seems to be mentating better.  Patient with normal vitals.  No fever.  CT scan of the head was unremarkable.  Patient with no significant leukocytosis, mild elevation in creatinine.  Otherwise normal electrolytes.  Troponin and EKG unremarkable.  Doubt ACS.  Neurology recommends admission for further TIA work-up including MRI, echocardiogram. Low concern for seizure. U/a pending. [facility].  Remained hemodynamically stable throughout my care.  This chart was dictated using voice recognition software.  Despite best efforts to proofread,  errors can occur which can change the documentation meaning.    EKG Interpretation  Date/Time:  Saturday August 09 2018 15:17:57 EST Ventricular Rate:  89 PR Interval:    QRS Duration: 87 QT Interval:  392 QTC Calculation: 477 R Axis:   -48 Text Interpretation:  Sinus rhythm Multiple ventricular premature complexes Left anterior fascicular block Abnormal R-wave progression, early transition Confirmed by Virgina NorfolkAdam, [place] [phone]([id number]) on 08/09/2018 3:42:02 PM           Virgina Norfolkuratolo, Adam, DO 08/09/18 1701

## 2018-08-10 ENCOUNTER — Observation Stay (HOSPITAL_COMMUNITY): Payer: Medicare Other

## 2018-08-10 ENCOUNTER — Other Ambulatory Visit (HOSPITAL_COMMUNITY): Payer: Medicare PPO

## 2018-08-10 ENCOUNTER — Observation Stay (HOSPITAL_BASED_OUTPATIENT_CLINIC_OR_DEPARTMENT_OTHER): Payer: Medicare Other

## 2018-08-10 DIAGNOSIS — N179 Acute kidney failure, unspecified: Secondary | ICD-10-CM | POA: Diagnosis present

## 2018-08-10 DIAGNOSIS — I361 Nonrheumatic tricuspid (valve) insufficiency: Secondary | ICD-10-CM

## 2018-08-10 DIAGNOSIS — N183 Chronic kidney disease, stage 3 (moderate): Secondary | ICD-10-CM

## 2018-08-10 DIAGNOSIS — G459 Transient cerebral ischemic attack, unspecified: Secondary | ICD-10-CM | POA: Diagnosis not present

## 2018-08-10 DIAGNOSIS — G9341 Metabolic encephalopathy: Secondary | ICD-10-CM | POA: Diagnosis present

## 2018-08-10 DIAGNOSIS — E785 Hyperlipidemia, unspecified: Secondary | ICD-10-CM | POA: Diagnosis not present

## 2018-08-10 DIAGNOSIS — R55 Syncope and collapse: Secondary | ICD-10-CM

## 2018-08-10 LAB — HEMOGLOBIN A1C
Hgb A1c MFr Bld: 6.3 % — ABNORMAL HIGH (ref 4.8–5.6)
Mean Plasma Glucose: 134.11 mg/dL

## 2018-08-10 LAB — LIPID PANEL
Cholesterol: 119 mg/dL (ref 0–200)
HDL: 66 mg/dL (ref >40)
LDL Cholesterol: 46 mg/dL (ref 0–99)
Total CHOL/HDL Ratio: 1.8 RATIO
Triglycerides: 37 mg/dL (ref <150)
VLDL: 7 mg/dL (ref 0–40)

## 2018-08-10 LAB — ECHOCARDIOGRAM COMPLETE
Height: 70 in
Weight: 2821.888 oz

## 2018-08-10 MED ORDER — SENNOSIDES-DOCUSATE SODIUM 8.6-50 MG PO TABS: 1.0000 | ORAL_TABLET | Freq: Every evening | ORAL | Status: DC | PRN

## 2018-08-10 MED ORDER — ACETAMINOPHEN 325 MG PO TABS: 650.0000 mg | ORAL_TABLET | ORAL | Status: DC | PRN

## 2018-08-10 MED ORDER — HALOPERIDOL LACTATE 5 MG/ML IJ SOLN
2.0000 mg | Freq: Once | INTRAMUSCULAR | Status: AC
  Administered 2018-08-10: 2 mg via INTRAMUSCULAR
  Filled 2018-08-10: qty 1

## 2018-08-10 MED ORDER — AMLODIPINE BESYLATE 10 MG PO TABS
10.0000 mg | ORAL_TABLET | Freq: Every day | ORAL | Status: DC
  Administered 2018-08-10: 10 mg via ORAL
  Filled 2018-08-10: qty 1

## 2018-08-10 MED ORDER — ENOXAPARIN SODIUM 40 MG/0.4ML ~~LOC~~ SOLN
40.0000 mg | SUBCUTANEOUS | Status: DC
  Administered 2018-08-10: 40 mg via SUBCUTANEOUS
  Filled 2018-08-10: qty 0.4

## 2018-08-10 MED ORDER — ASPIRIN 300 MG RE SUPP: 300.0000 mg | Freq: Every day | RECTAL | Status: DC

## 2018-08-10 MED ORDER — LISINOPRIL 20 MG PO TABS: 20.0000 mg | ORAL_TABLET | Freq: Every day | ORAL | Status: DC

## 2018-08-10 MED ORDER — STROKE: EARLY STAGES OF RECOVERY BOOK
Freq: Once | Status: AC
  Administered 2018-08-10: 06:00:00

## 2018-08-10 MED ORDER — ACETAMINOPHEN 650 MG RE SUPP: 650.0000 mg | RECTAL | Status: DC | PRN

## 2018-08-10 MED ORDER — ASPIRIN 325 MG PO TABS
325.0000 mg | ORAL_TABLET | Freq: Every day | ORAL | Status: DC
  Administered 2018-08-10: 325 mg via ORAL
  Filled 2018-08-10: qty 1

## 2018-08-10 MED ORDER — ATORVASTATIN CALCIUM 10 MG PO TABS: 10.0000 mg | ORAL_TABLET | Freq: Every day | ORAL | Status: DC

## 2018-08-10 MED ORDER — LORAZEPAM 2 MG/ML IJ SOLN: 1.0000 mg | INTRAMUSCULAR | Status: DC | PRN

## 2018-08-10 MED ORDER — SODIUM CHLORIDE 0.9 % IV SOLN
INTRAVENOUS | Status: DC
  Administered 2018-08-10: 03:00:00 via INTRAVENOUS

## 2018-08-10 MED ORDER — GADOBUTROL 1 MMOL/ML IV SOLN
8.0000 mL | Freq: Once | INTRAVENOUS | Status: AC | PRN
  Administered 2018-08-10: 8 mL via INTRAVENOUS

## 2018-08-10 MED ORDER — ACETAMINOPHEN 160 MG/5ML PO SOLN: 650.0000 mg | ORAL | Status: DC | PRN

## 2018-08-10 NOTE — Progress Notes (Signed)
OT Cancellation Note  Patient Details Name: Shane RisingFrank D Mickle MRN: [id number] DOB: 11/08/1929   Cancelled Treatment:    Reason Eval/Treat Not Completed: Patient at procedure or test/ unavailable.  Will continue to follow and initiate OT evaluation as able.   Chancy Milroyhristie S Burns, OT Acute Rehabilitation Services Pager [phone] Office [phone]   Chancy MilroyChristie S Burns 08/10/2018, 8:35 AM

## 2018-08-10 NOTE — Evaluation (Signed)
Occupational Therapy Evaluation Patient Details Name: Shane Juarez MRN: [id number] DOB: 04/27/1930 Today's Date: 08/10/2018    History of Present Illness Pt is a 84 y.o. male with medical history significant of hypertension, hyperlipidemia, stroke, CKD-2, who presents with altered mental status and syncope and right arm weakness. CT head is negative for acute intracranial abnormalities, but showed progressed cerebral white matter disease since 2013, and bilateral cerebellar encephalomalacia, in part corresponding to the large right cerebellar infarct. MRI negative.    Clinical Impression   Patient seated EOB with nurse upon therapist entering room.  Patient oriented to self only, agreeable to participate with OT.  Reports independent with ADLs and mobility, but unclear of accuracy of history due to cognition.  Patient admitted for above and limited by impaired balance, decreased safety awareness, impaired cognition (problem solving, attention, memory) and generalized weakness.  Pt currently requires setup assist for UB ADLs, min assist for LB ADLs, min guard for grooming at sink, and min assist for transfers.  Attempted use of RW for mobility for increased support, but became more of a hazard due to poor mgmt and decreased recall of use (even with cueing).  Patient had 1 LOB during session requiring min assist to correct. Based on performance today, believe patient will best benefit by returning to familiar environment but recommend he have 24/7 assistance and will follow up with HHOT services.  Will continue to follow while admitted.      Follow Up Recommendations  Home health OT;Supervision/Assistance - 24 hour    Equipment Recommendations  3 in 1 bedside commode    Recommendations for Other Services       Precautions / Restrictions Precautions Precautions: Fall Restrictions Weight Bearing Restrictions: No      Mobility Bed Mobility Overal bed mobility: Needs Assistance Bed  Mobility: Sit to Supine       Sit to supine: Min guard   General bed mobility comments: min guard for safety  Transfers Overall transfer level: Needs assistance Equipment used: 1 person hand held assist;Rolling walker (2 wheeled) Transfers: Sit to/from Stand Sit to Stand: Min assist         General transfer comment: min assist for balance and safety, attempted use of RW but patient leaving walker throughout session    Balance Overall balance assessment: Needs assistance   Sitting balance-Leahy Scale: Fair Sitting balance - Comments: able to manage socks with supervision seated EOB   Standing balance support: No upper extremity supported;During functional activity Standing balance-Leahy Scale: Poor Standing balance comment: reliant on external support to maintain balance                           ADL either performed or assessed with clinical judgement   ADL Overall ADL's : Needs assistance/impaired     Grooming: Min guard;Standing   Upper Body Bathing: Set up;Sitting;Supervision/ safety   Lower Body Bathing: Minimal assistance;Sit to/from stand   Upper Body Dressing : Minimal assistance;Sitting   Lower Body Dressing: Minimal assistance;Sit to/from stand   Toilet Transfer: Minimal assistance;Ambulation Toilet Transfer Details (indicate cue type and reason): simulated in room, attempted use of RW with poor management         Functional mobility during ADLs: Minimal assistance;Cueing for safety General ADL Comments: Pt limited by impaired balance and cognition, increased agitation and confusion since family left room.      Vision Patient Visual Report: No change from baseline Vision Assessment?: No apparent visual  deficits     Perception     Praxis      Pertinent Vitals/Pain Pain Assessment: Faces Faces Pain Scale: No hurt     Hand Dominance Right   Extremity/Trunk Assessment Upper Extremity Assessment Upper Extremity Assessment:  Generalized weakness   Lower Extremity Assessment Lower Extremity Assessment: Defer to PT evaluation       Communication Communication Communication: No difficulties   Cognition Arousal/Alertness: Awake/alert Behavior During Therapy: Impulsive;Restless;Agitated Overall Cognitive Status: History of cognitive impairments - at baseline Area of Impairment: Orientation;Memory;Attention;Safety/judgement;Following commands;Problem solving;Awareness                 Orientation Level: Disoriented to;Time;Place;Situation Current Attention Level: Focused Memory: Decreased recall of precautions;Decreased short-term memory Following Commands: Follows one step commands with increased time;Follows one step commands inconsistently Safety/Judgement: Decreased awareness of safety;Decreased awareness of deficits Awareness: Intellectual Problem Solving: Difficulty sequencing;Requires verbal cues General Comments: pt with increased confusion and agitation since family left    General Comments       Exercises     Shoulder Instructions      Home Living Family/patient expects to be discharged to:: Private residence Living Arrangements: Spouse/significant other Available Help at Discharge: Family;Available PRN/intermittently(near 24/7) Type of Home: House Home Access: Level entry(stairs at back entrance)     Home Layout: Two level;Other (Comment)(per son, no need to go upstairs)                   Additional Comments: Pt confused throughout session, history obtained from PT, unable to obtain bathroom setup from patient      Prior Functioning/Environment Level of Independence: Needs assistance  Gait / Transfers Assistance Needed: Per pt, he has a cane, but doesn't use it ADL's / Homemaking Assistance Needed: Pt tells me his wife helps him manage his medications   Comments: history per PT, patient reports independent with mobility and ADLs         OT Problem List: Decreased  strength;Decreased activity tolerance;Impaired balance (sitting and/or standing);Decreased cognition;Decreased safety awareness;Decreased knowledge of use of DME or AE      OT Treatment/Interventions: Self-care/ADL training;Therapeutic exercise;DME and/or AE instruction;Therapeutic activities;Cognitive remediation/compensation;Patient/family education;Balance training    OT Goals(Current goals can be found in the care plan section) Acute Rehab OT Goals Patient Stated Goal: did not state OT Goal Formulation: Patient unable to participate in goal setting Time For Goal Achievement: 08/24/18 Potential to Achieve Goals: Good  OT Frequency: Min 2X/week   Barriers to D/C:            Co-evaluation              AM-PAC PT "6 Clicks" Daily Activity     Outcome Measure Help from another person eating meals?: None Help from another person taking care of personal grooming?: A Little Help from another person toileting, which includes using toliet, bedpan, or urinal?: A Little Help from another person bathing (including washing, rinsing, drying)?: A Little Help from another person to put on and taking off regular upper body clothing?: A Little Help from another person to put on and taking off regular lower body clothing?: A Little 6 Click Score: 19   End of Session Equipment Utilized During Treatment: Rolling walker Nurse Communication: Mobility status  Activity Tolerance: Patient tolerated treatment well Patient left: in bed;with bed alarm set;with call bell/phone within reach  OT Visit Diagnosis: Unsteadiness on feet (R26.81);Other abnormalities of gait and mobility (R26.89);Muscle weakness (generalized) (M62.81);Other symptoms and signs involving cognitive function  Time: 1610-9604 OT Time Calculation (min): 13 min Charges:  OT General Charges $OT Visit: 1 Visit OT Evaluation $OT Eval Moderate Complexity: 1 Mod  Chancy Milroy, OT Acute Rehabilitation  Services Pager [phone] Office [phone]   Chancy Milroy 08/10/2018, 3:36 PM

## 2018-08-10 NOTE — Discharge Summary (Signed)
Physician Discharge Summary  Shane Juarez QMV:[id number] DOB: 05-29-1930 DOA: 08/09/2018  PCP: [name], MD  Admit date: 08/09/2018 Discharge date: 08/10/2018  Admitted From: Home Disposition: Home PT    Recommendations for Outpatient Follow-up:  1. Follow up with PCP in 1-2 weeks 2. Please obtain BMP/CBC in one week your next doctors visit.  3. Outpatient follow-up with neurology  Home Health: PT Equipment/Devices: None Discharge Condition: Stable CODE STATUS: Full code Diet recommendation: Renal  Brief/Interim Summary: 84 year old with a history of essential hypertension, hyperlipidemia, stroke, CKD stage III came to the hospital with complaints of altered mental status and syncope.  His symptoms started a day prior to the admission around noontime and there was concerns of possible blackout.  Patient is a poor historian given his advanced dementia.  His initial work-up was negative.  There was no signs of infection noted.  MRI of the brain did not show any acute stroke.  EEG obtained did not show any activity of seizure.  His routine labs were unremarkable.  Physical therapy recommended home PT therefore arrangements were made.  Patient was slightly delirious in the evening but family insisted on taking him home therefore he is being discharged currently stable condition.  His vital signs are stable.   Discharge Diagnoses:  Principal Problem:   Acute metabolic encephalopathy Active Problems:   CVA (cerebral vascular accident) (HCC)   HTN (hypertension)   Syncope   HLD (hyperlipidemia)   Acute renal failure superimposed on stage 3 chronic kidney disease (HCC)  Acute metabolic encephalopathy secondary to delirium Syncope?,  Suspicion for seizure versus CVA - MRI of the brain is negative.  EEG does not show any seizure-like activity.  No obvious signs of infection noted.  Patient seen by neurology who recommended outpatient neurology follow-up. - Haldol was given earlier  due to some delirious activity but at this time family is insisting on taking the patient home therefore we will discharge him.  Vital signs are stable at this time. -Physical therapy is recommending home PT therefore arrangements made by the social worker  Previous history of CVA -Continue aspirin and Lipitor  Essential hypertension -Resume home meds amlodipine and lisinopril  Hyperlipidemia -Continue Lipitor  Chronic kidney disease stage III - Follow-up outpatient with his primary care provider.  Lovenox subcu DVT prophylaxis while here Patient is full code Family insisting on taking the patient home at this time therefore being discharged.  Otherwise medically stable.    Discharge Instructions   Allergies as of 08/10/2018   No Known Allergies     Medication List    TAKE these medications   amLODipine 5 MG tablet Commonly known as:  NORVASC Take 10 mg by mouth daily.   aspirin 81 MG tablet Take 81 mg by mouth daily.   atorvastatin 10 MG tablet Commonly known as:  LIPITOR Take 1 tablet by mouth Daily.   lisinopril 20 MG tablet Commonly known as:  Electronics engineer  (From admission, onward)         Start     Ordered   08/10/18 1531  For home use only DME 3 n 1  Once     08/10/18 1530         Follow-up Information    [facility], [facility] Follow up.   Specialty:  Home Health Services Why:  [name] will call you to set up appointments for therapy starting 11/19.  Contact information: [address] [place] [address] [phone]        [name], MD. Schedule an appointment as soon as possible for a visit in 1 week(s).   Specialty:  Family Medicine Contact information: [address] [place] [address] [phone]        [facility]. Schedule an appointment as soon as possible for a visit in 3 week(s).   Contact information: [address]     [address] [place] [address] [phone]         No Known Allergies  You were cared for by a hospitalist during your hospital stay. If you have any questions about your discharge medications or the care you received while you were in the hospital after you are discharged, you can call the unit and asked to speak with the hospitalist on call if the hospitalist that took care of you is not available. Once you are discharged, your primary care physician will handle any further medical issues. Please note that no refills for any discharge medications will be authorized once you are discharged, as it is imperative that you return to your primary care physician (or establish a relationship with a primary care physician if you do not have one) for your aftercare needs so that they can reassess your need for medications and monitor your lab values.  Consultations:  Neurology   Procedures/Studies: Mr Maxine Glenn Neck W Wo Contrast  Result Date: 08/10/2018 CLINICAL DATA:  Transient episode of lightheadedness and disorientation. Questionable right-sided weakness. EXAM: MRI HEAD WITHOUT CONTRAST MRA HEAD WITHOUT CONTRAST MRA NECK WITHOUT AND WITH CONTRAST TECHNIQUE: Multiplanar, multiecho pulse sequences of the brain and surrounding structures were obtained without intravenous contrast. Angiographic images of the Circle of Willis were obtained using MRA technique without intravenous contrast. Angiographic images of the neck were obtained using MRA technique without and with intravenous contrast. Carotid stenosis measurements (when applicable) are obtained utilizing NASCET criteria, using the distal internal carotid diameter as the denominator. CONTRAST:  8 mL Gadavist COMPARISON:  Head CT 08/09/2018 and MRI/MRA 01/08/2012 FINDINGS: MRI HEAD FINDINGS The study is motion degraded throughout with some sequences being moderately to severely motion degraded despite repeated imaging attempts. Axial T1  imaging was not performed. Brain: There is no evidence of acute infarct, intracranial hemorrhage, mass, midline shift, or extra-axial fluid collection. Large right and moderate-sized left cerebellar infarcts are chronic. Patchy to confluent T2 hyperintensities in the cerebral white matter have progressed from 2013 and are nonspecific but compatible with moderate chronic small vessel ischemic disease. Mild chronic small vessel ischemic changes are present in the pons. Cerebral atrophy has progressed from 2013, and there is prominent mesial temporal lobe atrophy. Vascular: Abnormal distal right vertebral artery, more fully evaluated below. Skull and upper cervical spine: No suspicious marrow lesion. Sinuses/Orbits: Unremarkable orbits. Paranasal sinuses and mastoid air cells are clear. Other: None. MRA HEAD FINDINGS There is moderate motion artifact at the skull base. The visualized distal left vertebral artery is patent with moderate multifocal stenoses which have progressed from 2013. The distal right vertebral artery is chronically occluded. The basilar artery is patent without evidence of significant stenosis allowing for motion artifact proximally. Patent SCA is are seen bilaterally. There is a moderately large right posterior communicating artery. Both PCAs are patent with an unchanged moderate right P2 stenosis. The internal carotid arteries are patent from skull base to carotid termini with atherosclerotic irregularity but no evidence of significant stenosis  allowing for motion artifact. ACAs and MCAs are patent without evidence of proximal branch occlusion or significant proximal stenosis. No aneurysm is identified. MRA NECK FINDINGS There is a normal variant aortic arch branching pattern with common origin of the brachiocephalic and left common carotid arteries. The brachiocephalic and subclavian arteries are widely patent. The carotid arteries are patent with atherosclerotic irregularity at the carotid  bulbs bilaterally but no evidence of significant stenosis or dissection. The left vertebral artery is patent and dominant with a moderate to severe stenosis at its origin and with additional moderate stenoses more distally in the V1 and V2 segments. The non dominant right vertebral artery is patent through the V1 and V2 segments with occlusion of the V3 and V4 segments. IMPRESSION: 1. No acute intracranial abnormality. 2. Moderate chronic small vessel ischemic disease. 3. Large chronic cerebellar infarcts. 4. Motion degraded head MRA without acute large vessel occlusion. 5. 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 6. 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 7. Widely patent cervical carotid arteries. Electronically Signed   By: Sebastian AcheAllen  Grady M.D.   On: 08/10/2018 13:37   Mr Brain Wo Contrast  Result Date: 08/10/2018 CLINICAL DATA:  Transient episode of lightheadedness and disorientation. Questionable right-sided weakness. EXAM: MRI HEAD WITHOUT CONTRAST MRA HEAD WITHOUT CONTRAST MRA NECK WITHOUT AND WITH CONTRAST TECHNIQUE: Multiplanar, multiecho pulse sequences of the brain and surrounding structures were obtained without intravenous contrast. Angiographic images of the Circle of Willis were obtained using MRA technique without intravenous contrast. Angiographic images of the neck were obtained using MRA technique without and with intravenous contrast. Carotid stenosis measurements (when applicable) are obtained utilizing NASCET criteria, using the distal internal carotid diameter as the denominator. CONTRAST:  8 mL Gadavist COMPARISON:  Head CT 08/09/2018 and MRI/MRA 01/08/2012 FINDINGS: MRI HEAD FINDINGS The study is motion degraded throughout with some sequences being moderately to severely motion degraded despite repeated imaging attempts. Axial T1 imaging was not performed. Brain: There is no evidence of acute infarct, intracranial hemorrhage, mass, midline shift, or  extra-axial fluid collection. Large right and moderate-sized left cerebellar infarcts are chronic. Patchy to confluent T2 hyperintensities in the cerebral white matter have progressed from 2013 and are nonspecific but compatible with moderate chronic small vessel ischemic disease. Mild chronic small vessel ischemic changes are present in the pons. Cerebral atrophy has progressed from 2013, and there is prominent mesial temporal lobe atrophy. Vascular: Abnormal distal right vertebral artery, more fully evaluated below. Skull and upper cervical spine: No suspicious marrow lesion. Sinuses/Orbits: Unremarkable orbits. Paranasal sinuses and mastoid air cells are clear. Other: None. MRA HEAD FINDINGS There is moderate motion artifact at the skull base. The visualized distal left vertebral artery is patent with moderate multifocal stenoses which have progressed from 2013. The distal right vertebral artery is chronically occluded. The basilar artery is patent without evidence of significant stenosis allowing for motion artifact proximally. Patent SCA is are seen bilaterally. There is a moderately large right posterior communicating artery. Both PCAs are patent with an unchanged moderate right P2 stenosis. The internal carotid arteries are patent from skull base to carotid termini with atherosclerotic irregularity but no evidence of significant stenosis allowing for motion artifact. ACAs and MCAs are patent without evidence of proximal branch occlusion or significant proximal stenosis. No aneurysm is identified. MRA NECK FINDINGS There is a normal variant aortic arch branching pattern with common origin of the brachiocephalic and left common carotid arteries. The brachiocephalic and subclavian arteries are  widely patent. The carotid arteries are patent with atherosclerotic irregularity at the carotid bulbs bilaterally but no evidence of significant stenosis or dissection. The left vertebral artery is patent and dominant with  a moderate to severe stenosis at its origin and with additional moderate stenoses more distally in the V1 and V2 segments. The non dominant right vertebral artery is patent through the V1 and V2 segments with occlusion of the V3 and V4 segments. IMPRESSION: 1. No acute intracranial abnormality. 2. Moderate chronic small vessel ischemic disease. 3. Large chronic cerebellar infarcts. 4. Motion degraded head MRA without acute large vessel occlusion. 5. 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 6. 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 7. Widely patent cervical carotid arteries. Electronically Signed   By: Sebastian Ache M.D.   On: 08/10/2018 13:37   Mr Maxine Glenn Head Wo Contrast  Result Date: 08/10/2018 CLINICAL DATA:  Transient episode of lightheadedness and disorientation. Questionable right-sided weakness. EXAM: MRI HEAD WITHOUT CONTRAST MRA HEAD WITHOUT CONTRAST MRA NECK WITHOUT AND WITH CONTRAST TECHNIQUE: Multiplanar, multiecho pulse sequences of the brain and surrounding structures were obtained without intravenous contrast. Angiographic images of the Circle of Willis were obtained using MRA technique without intravenous contrast. Angiographic images of the neck were obtained using MRA technique without and with intravenous contrast. Carotid stenosis measurements (when applicable) are obtained utilizing NASCET criteria, using the distal internal carotid diameter as the denominator. CONTRAST:  8 mL Gadavist COMPARISON:  Head CT 08/09/2018 and MRI/MRA 01/08/2012 FINDINGS: MRI HEAD FINDINGS The study is motion degraded throughout with some sequences being moderately to severely motion degraded despite repeated imaging attempts. Axial T1 imaging was not performed. Brain: There is no evidence of acute infarct, intracranial hemorrhage, mass, midline shift, or extra-axial fluid collection. Large right and moderate-sized left cerebellar infarcts are chronic. Patchy to confluent T2  hyperintensities in the cerebral white matter have progressed from 2013 and are nonspecific but compatible with moderate chronic small vessel ischemic disease. Mild chronic small vessel ischemic changes are present in the pons. Cerebral atrophy has progressed from 2013, and there is prominent mesial temporal lobe atrophy. Vascular: Abnormal distal right vertebral artery, more fully evaluated below. Skull and upper cervical spine: No suspicious marrow lesion. Sinuses/Orbits: Unremarkable orbits. Paranasal sinuses and mastoid air cells are clear. Other: None. MRA HEAD FINDINGS There is moderate motion artifact at the skull base. The visualized distal left vertebral artery is patent with moderate multifocal stenoses which have progressed from 2013. The distal right vertebral artery is chronically occluded. The basilar artery is patent without evidence of significant stenosis allowing for motion artifact proximally. Patent SCA is are seen bilaterally. There is a moderately large right posterior communicating artery. Both PCAs are patent with an unchanged moderate right P2 stenosis. The internal carotid arteries are patent from skull base to carotid termini with atherosclerotic irregularity but no evidence of significant stenosis allowing for motion artifact. ACAs and MCAs are patent without evidence of proximal branch occlusion or significant proximal stenosis. No aneurysm is identified. MRA NECK FINDINGS There is a normal variant aortic arch branching pattern with common origin of the brachiocephalic and left common carotid arteries. The brachiocephalic and subclavian arteries are widely patent. The carotid arteries are patent with atherosclerotic irregularity at the carotid bulbs bilaterally but no evidence of significant stenosis or dissection. The left vertebral artery is patent and dominant with a moderate to severe stenosis at its origin and with additional moderate stenoses more distally in the V1 and  V2  segments. The non dominant right vertebral artery is patent through the V1 and V2 segments with occlusion of the V3 and V4 segments. IMPRESSION: 1. No acute intracranial abnormality. 2. Moderate chronic small vessel ischemic disease. 3. Large chronic cerebellar infarcts. 4. Motion degraded head MRA without acute large vessel occlusion. 5. 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 6. 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 7. Widely patent cervical carotid arteries. Electronically Signed   By: Sebastian Ache M.D.   On: 08/10/2018 13:37   Ct Head Code Stroke Wo Contrast  Addendum Date: 08/09/2018   ADDENDUM REPORT: 08/09/2018 15:52 ADDENDUM: Study discussed by telephone with PA SOPHIA CACCAVALE on 08/09/2018 at 1539 hours. Electronically Signed   By: Odessa Fleming M.D.   On: 08/09/2018 15:52   Result Date: 08/09/2018 CLINICAL DATA:  Code stroke. 85 year old male with altered mental status. EXAM: CT HEAD WITHOUT CONTRAST TECHNIQUE: Contiguous axial images were obtained from the base of the skull through the vertex without intravenous contrast. COMPARISON:  Brain MRI 01/08/2012 and head CT 01/07/2012. 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 Patchy and confluent bilateral cerebral white matter hypodensity has mildly increased since 2013. Generalized cerebral volume loss since 2013. No midline shift, ventriculomegaly, mass effect, evidence of mass lesion, intracranial hemorrhage or evidence of cortically based acute infarction. No cerebral cortical encephalomalacia identified. Vascular: Extensive Calcified atherosclerosis at the skull base. No suspicious intracranial vascular hyperdensity. Skull: Negative. Sinuses/Orbits: Visualized paranasal sinuses and mastoids are stable and well pneumatized. Other: No acute orbit or scalp soft tissue finding. ASPECTS [facility] Stroke Program Early CT Score) - Ganglionic  level infarction (caudate, lentiform nuclei, internal capsule, insula, M1-M3 cortex): 7 - Supraganglionic infarction (M4-M6 cortex): 3 Total score (0-10 with 10 being normal): 10 IMPRESSION: 1. No acute intracranial hemorrhage or acute cortically based infarct identified. ASPECTS is 10. 2. Progressed cerebral white matter disease since 2013. Bilateral cerebellar encephalomalacia, in part corresponding to the large right cerebellar infarct. Electronically Signed: By: Odessa Fleming M.D. On: 08/09/2018 15:36      Subjective: Patient slightly delirious and try to get out of bed.  Family wants to take the patient home.  EEG does not show any signs of seizure activity.  Family aware and will take the patient home.     Discharge Exam: Vitals:   08/10/18 1212 08/10/18 1634  BP: (!) 153/58 136/66  Pulse: 83 87  Resp: 18 18  Temp: 98.2 F (36.8 C) 97.7 F (36.5 C)  SpO2: 100% 100%   Vitals:   08/10/18 0404 08/10/18 0743 08/10/18 1212 08/10/18 1634  BP: (!) 129/52 (!) 149/62 (!) 153/58 136/66  Pulse: 74 87 83 87  Resp:  18 18 18   Temp: 98.1 F (36.7 C) 98.1 F (36.7 C) 98.2 F (36.8 C) 97.7 F (36.5 C)  TempSrc: Oral Oral Oral Oral  SpO2:  99% 100% 100%  Weight:      Height:        General: Patient is awake and alert.  Carries on very basic conversation and tells me his name.  But he is delirious and try to get out of bed. Cardiovascular: RRR, S1/S2 +, no rubs, no gallops Respiratory: CTA bilaterally, no wheezing, no rhonchi Abdominal: Soft, NT, ND, bowel sounds + Extremities: no edema, no cyanosis    The results of significant diagnostics from this hospitalization (including imaging, microbiology, ancillary and laboratory) are listed below for reference.  Microbiology: No results found for this or any previous visit (from the past 240 hour(s)).   Labs: BNP (last 3 results) No results for input(s): BNP in the last 8760 hours. Basic Metabolic Panel: Recent Labs  Lab  08/09/18 1557  NA 137  K 4.3  CL 105  CO2 25  GLUCOSE 142*  BUN 20  CREATININE 1.40*  CALCIUM 8.4*  MG 2.2   Liver Function Tests: Recent Labs  Lab 08/09/18 1557  AST 18  ALT 12  ALKPHOS 76  BILITOT 0.6  PROT 6.9  ALBUMIN 3.6   No results for input(s): LIPASE, AMYLASE in the last 168 hours. No results for input(s): AMMONIA in the last 168 hours. CBC: Recent Labs  Lab 08/09/18 1535  WBC 7.1  NEUTROABS 3.5  HGB 11.1*  HCT 34.4*  MCV 99.7  PLT 205   Cardiac Enzymes: Recent Labs  Lab 08/09/18 1535  TROPONINI <0.03   BNP: Invalid input(s): POCBNP CBG: No results for input(s): GLUCAP in the last 168 hours. D-Dimer No results for input(s): DDIMER in the last 72 hours. Hgb A1c Recent Labs    08/10/18 0433  HGBA1C 6.3*   Lipid Profile Recent Labs    08/10/18 0433  CHOL 119  HDL 66  LDLCALC 46  TRIG 37  CHOLHDL 1.8   Thyroid function studies No results for input(s): TSH, T4TOTAL, T3FREE, THYROIDAB in the last 72 hours.  Invalid input(s): FREET3 Anemia work up No results for input(s): VITAMINB12, FOLATE, FERRITIN, TIBC, IRON, RETICCTPCT in the last 72 hours. Urinalysis    Component Value Date/Time   COLORURINE YELLOW 08/09/2018 1846   APPEARANCEUR CLEAR 08/09/2018 1846   LABSPEC 1.016 08/09/2018 1846   PHURINE 6.0 08/09/2018 1846   GLUCOSEU NEGATIVE 08/09/2018 1846   HGBUR NEGATIVE 08/09/2018 1846   BILIRUBINUR NEGATIVE 08/09/2018 1846   KETONESUR NEGATIVE 08/09/2018 1846   PROTEINUR NEGATIVE 08/09/2018 1846   UROBILINOGEN 0.2 01/06/2012 1335   NITRITE NEGATIVE 08/09/2018 1846   LEUKOCYTESUR NEGATIVE 08/09/2018 1846   Sepsis Labs Invalid input(s): PROCALCITONIN,  WBC,  LACTICIDVEN Microbiology No results found for this or any previous visit (from the past 240 hour(s)).   Time coordinating discharge:  I have spent 35 minutes face to face with the patient and on the ward discussing the patients care, assessment, plan and disposition with  other care givers. >50% of the time was devoted counseling the patient about the risks and benefits of treatment/Discharge disposition and coordinating care.   SIGNED:   Dimple Nanas, MD  [facility] 08/10/2018, 5:38 PM Pager   If 7PM-7AM, please contact night-coverage [email or web address] Password [email or web address]

## 2018-08-10 NOTE — Progress Notes (Signed)
84 year old with history of essential hypertension, hyperlipidemia, CVA, CKD stage II came to the hospital with change in mental status and concerns of syncope.  There was also concern of the right upper extremity weakness.  Patient seen and examined at bedside this morning he does not have any complaints.  Patient was admitted overnight by Dr.  Clyde LundborgNiu  Vital signs appears to be stable.  Patient is trying to get out of the bed.  Hemoglobin A1c is 6.3 LDL 46 Echocardiogram shows ejection fraction 60 to 65% with grade 1 diastolic dysfunction MRI Brain/MRA= Neg for acute stroke but shows chronic changes. Large old cerebellar infarct.  Chronic occlusion of right V3-V4 segment, moderate to severe multifocal left vertebral artery stenosis, widely patent cervical carotid artery EEG-pending  Physical therapy recommended home PT OT-pending  Continue supportive care as planned earlier this time.  Await EEG results.  Awaiting final neurology input.  Will consult case manager to help make arrangements for home health.  Face-to-face performed.  Call with questions as needed.   Stephania FragminAnkit Amin MD

## 2018-08-10 NOTE — Progress Notes (Addendum)
NEURO HOSPITALIST PROGRESS NOTE   Subjective: Patient is awake, alert, in bed, NAD. Son at bedside. Son states he is a lot better since coming into the hospital. MRI and EEG have been ordered but have not yet been done.   Exam: Vitals:   08/10/18 0204 08/10/18 0404  BP: (!) 149/65 (!) 129/52  Pulse: 86 74  Resp:    Temp: 97.8 F (36.6 C) 98.1 F (36.7 C)  SpO2: 98%     Physical Exam   HEENT-  Normocephalic, no lesions, without obvious abnormality.  Normal external eye and conjunctiva.   Cardiovascular- S1-S2 audible, pulses palpable throughout   Lungs-no rhonchi or wheezing noted, no excessive working breathing.  Saturations within normal limits Abdomen- All 4 quadrants palpated and nontender Extremities- Warm, dry and intact Musculoskeletal-no joint tenderness, deformity or swelling Skin-warm and dry, no hyperpigmentation, vitiligo, or suspicious lesions   Neuro:  Mental Status: Patient awake, alert, oriented to name, birthday. He is not oriented to month or year. No aphasia. No dysarthria.. Cranial Nerves: II:  Visual fields grossly normal,  III,IV, VI: ptosis not present, extra-ocular motions intact bilaterally pupils equal, round, reactive to light and accommodation V,VII: smile symmetric, facial light touch sensation normal bilaterally VIII: hearing normal bilaterally IX,X: uvula rises symmetrically XI: bilateral shoulder shrug XII: midline tongue extension Motor: Right : Upper extremity   5/5 Left:     Upper extremity   5/5  Lower extremity   5/5  Lower extremity   5/5 Tone and bulk:normal tone throughout; no atrophy noted Sensory:light touch intact throughout, bilaterally Plantars: Right: downgoing   Left: downgoing Cerebellar: No ataxia Gait: deferred    Medications:  Scheduled: . amLODipine  10 mg Oral Daily  . aspirin  300 mg Rectal Daily   Or  . aspirin  325 mg Oral Daily  . atorvastatin  10 mg Oral q1800  . enoxaparin  (LOVENOX) injection  40 mg Subcutaneous Q24H   Continuous: . sodium chloride 125 mL/hr at 08/10/18 6045   WUJ:[id number] **OR** acetaminophen (TYLENOL) oral liquid 160 mg/5 mL **OR** acetaminophen, LORazepam, senna-docusate  Pertinent Labs/Diagnostics:   Ct Head Code Stroke Wo Contrast  Addendum Date: 08/09/2018   ADDENDUM REPORT: 08/09/2018 15:52 ADDENDUM: Study discussed by telephone with PA SOPHIA CACCAVALE on 08/09/2018 at 1539 hours. Electronically Signed   By: Odessa Fleming M.D.   On: 08/09/2018 15:52   Result Date: 08/09/2018 CLINICAL DATA:  Code stroke. 84 year old male with altered mental status. EXAM: CT HEAD WITHOUT CONTRAST TECHNIQUE: Contiguous axial images were obtained from the base of the skull through the vertex without intravenous contrast. COMPARISON:  Brain MRI 01/08/2012 and head CT 01/07/2012. 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 Patchy and confluent bilateral cerebral white matter hypodensity has mildly increased since 2013. Generalized cerebral volume loss since 2013. No midline shift, ventriculomegaly, mass effect, evidence of mass lesion, intracranial hemorrhage or evidence of cortically based acute infarction. No cerebral cortical encephalomalacia identified. Vascular: Extensive Calcified atherosclerosis at the skull base. No suspicious intracranial vascular hyperdensity. Skull: Negative. Sinuses/Orbits: Visualized paranasal sinuses and mastoids are stable and well pneumatized. Other: No acute orbit or scalp soft tissue finding. ASPECTS [facility] Stroke Program Early CT Score) - Ganglionic level infarction (caudate, lentiform nuclei, internal capsule, insula, M1-M3 cortex): 7 - Supraganglionic infarction (M4-M6 cortex): 3 Total score (0-10 with 10  being normal): 10 IMPRESSION: 1. No acute intracranial hemorrhage or acute cortically based infarct identified. ASPECTS is 10. 2. Progressed cerebral  white matter disease since 2013. Bilateral cerebellar encephalomalacia, in part corresponding to the large right cerebellar infarct. Electronically Signed: By: Odessa FlemingH  Hall M.D. On: 08/09/2018 15:36   Assessment:   84 year old male with transient episode of lightheadedness and disorientation.  It is not uncommon for people with memory difficulty to have more disorientation with presyncope than would be expected with a younger patient.  I think that transient ischemic attack is less likely, though if a brain MRI were to show ischemic infarct then I would perform any work-up at that time.  Recommendations:  -- MRI brain, MRA head and neck -- presyncopal work-up per internal medicine -- EEG -- neurology will follow  Valentina LucksJessica Williams, MSN, NP-C [facility] [phone]  Attending neurologist's note to follow    08/10/2018, 7:33 AM     NEUROHOSPITALIST ADDENDUM Performed a face to face diagnostic evaluation.   I have reviewed the contents of history and physical exam as documented by PA/ARNP/Resident and agree with above documentation.  I have discussed and formulated the above plan as documented. Edits to the note have been made as needed.  Impression: 84 [facility].  Based on description by his wife, I feel this sounded more like a presyncope patient walking throughout the day empty stomach.  Wife and patient deny any focal weakness, however per chart review there was a question of a right side weakness. MRI brain is negative for acute stroke.  EEG was obtained:  read is pending. Orthostatic obtained showed a 23 mmHg drop in systolic blood pressure from lying to standing.  Key exam findings: Patient alert oriented to place.  Remembers events, denies loss of consciousness.  States that he just feels weak.  Feel that his memory may be impaired.  Plan: No further work-up needed.  Outpatient neurology follow-up.     Georgiana SpinnerSushanth  MD [facility] [phone]   If 7pm to 7am, please call on call as listed on AMION.

## 2018-08-10 NOTE — Evaluation (Signed)
Physical Therapy Evaluation Patient Details Name: Shane Juarez MRN: [id number] DOB: 12/05/1929 Today's Date: 08/10/2018   History of Present Illness  84 year old male with transient episode of lightheadedness and disorientation. It is not uncommon for people with memory difficulty to have more disorientation with presyncope than would be expected with a younger patient.  has a past medical history of Hyperlipidemia, Hypertension, and Stroke (HCC).  Clinical Impression   Pt admitted with above diagnosis. Pt currently with functional limitations due to the deficits listed below (see PT Problem List). Reports did not use an assistive device prior to admission; Presents with generalized weakness, unsteadiness on feet, incr fall risk, decr functional mobility; Orthostatics below -- [name] did not indicate any obvious moments of dizziness/lightheadedness;  Pt will benefit from skilled PT to increase their independence and safety with mobility to allow discharge to the venue listed below.       Follow Up Recommendations Home health PT;Supervision/Assistance - 24 hour    Equipment Recommendations  Rolling walker with 5" wheels;3in1 (PT)    Recommendations for Other Services OT consult(as ordered)     Precautions / Restrictions Precautions Precautions: Fall Restrictions Weight Bearing Restrictions: No      Mobility  Bed Mobility Overal bed mobility: Needs Assistance Bed Mobility: Supine to Sit     Supine to sit: Min guard     General bed mobility comments: Minguard for safety; cues to self-monitor for activity tolerance  Transfers Overall transfer level: Needs assistance Equipment used: 1 person hand held assist Transfers: Sit to/from Stand Sit to Stand: Min assist         General transfer comment: min assist for balance/steadiness; Noted he is tending to brace backs of LEs against bed for stability  Ambulation/Gait Ambulation/Gait assistance: Min assist Gait Distance  (Feet): 2 Feet([facility] and [facility] in place) Assistive device: 1 person hand held assist Gait Pattern/deviations: Shuffle     General Gait Details: Noted dependence on UE support for balance  Stairs            Wheelchair Mobility    Modified Rankin (Stroke Patients Only)       Balance Overall balance assessment: Needs assistance   Sitting balance-Leahy Scale: Fair Sitting balance - Comments: Approaching Good     Standing balance-Leahy Scale: Poor Standing balance comment: Braced backs of ALEs against bed or stability in standing                             Pertinent Vitals/Pain Pain Assessment: Faces Faces Pain Scale: Hurts little more Pain Location: plantar surface of bilateral feet when standing Pain Descriptors / Indicators: Burning Pain Intervention(s): Monitored during session    Home Living Family/patient expects to be discharged to:: Private residence Living Arrangements: Spouse/significant other Available Help at Discharge: Family;Available PRN/intermittently(near 24 hour) Type of Home: House Home Access: Level entry(stairs at back entrance)     Home Layout: Two level;Other (Comment)(per son, no need to go upstairs)   Additional Comments: [name] made good attempt to answer questions, but at times seemed confused, his oldest son helped     Prior Function Level of Independence: Needs assistance   Gait / Transfers Assistance Needed: Per pt, he has a cane, but doesn't use it  ADL's / Homemaking Assistance Needed: Pt tells me his wife helps him manage his medications        Hand Dominance        Extremity/Trunk  Assessment   Upper Extremity Assessment Upper Extremity Assessment: Defer to OT evaluation    Lower Extremity Assessment Lower Extremity Assessment: Generalized weakness       Communication   Communication: No difficulties  Cognition Arousal/Alertness: Awake/alert Behavior During Therapy: WFL for tasks  assessed/performed Overall Cognitive Status: History of cognitive impairments - at baseline Area of Impairment: Orientation;Memory                 Orientation Level: Disoriented to;Time             General Comments: Son assisted with home setup questions; Noted pt had difficulty with stating his birthday (confirmed with bracelet)      General Comments General comments (skin integrity, edema, etc.):   08/10/18 0811 08/10/18 0814  Vital Signs  Patient Position (if appropriate) Orthostatic Vitals  --   Orthostatic Lying   BP- Lying 148/77  --   Pulse- Lying 89  --   Orthostatic Sitting  BP- Sitting (!) 114/99 (Suspect incorrect reading) 158/71  Pulse- Sitting 93 92  Orthostatic Standing at 0 minutes  BP- Standing at 0 minutes  --  129/80  Pulse- Standing at 0 minutes  --  105  Orthostatic Standing at 3 minutes  BP- Standing at 3 minutes  --  135/72  Pulse- Standing at 3 minutes  --  108       Exercises     Assessment/Plan    PT Assessment Patient needs continued PT services  PT Problem List Decreased strength;Decreased activity tolerance;Decreased mobility;Decreased balance;Decreased cognition;Decreased knowledge of use of DME;Decreased safety awareness;Decreased knowledge of precautions       PT Treatment Interventions DME instruction;Gait training;Stair training;Functional mobility training;Therapeutic activities;Therapeutic exercise;Balance training;Neuromuscular re-education;Cognitive remediation;Patient/family education    PT Goals (Current goals can be found in the Care Plan section)  Acute Rehab PT Goals Patient Stated Goal: did not state PT Goal Formulation: With patient Time For Goal Achievement: 08/24/18 Potential to Achieve Goals: Good    Frequency Min 3X/week   Barriers to discharge   W"ould like to confirm that he has 24 hour assist    Co-evaluation               AM-PAC PT "6 Clicks" Daily Activity  Outcome Measure Difficulty  turning over in bed (including adjusting bedclothes, sheets and blankets)?: A Little Difficulty moving from lying on back to sitting on the side of the bed? : A Little Difficulty sitting down on and standing up from a chair with arms (e.g., wheelchair, bedside commode, etc,.)?: Unable Help needed moving to and from a bed to chair (including a wheelchair)?: A Little Help needed walking in hospital room?: A Little Help needed climbing 3-5 steps with a railing? : A Little 6 Click Score: 16    End of Session   Activity Tolerance: Patient tolerated treatment well Patient left: in bed(about to transport of Echo) Nurse Communication: Mobility status;Other (comment)(home meds written on tissue box and on computer) PT Visit Diagnosis: Unsteadiness on feet (R26.81);Other abnormalities of gait and mobility (R26.89);History of falling (Z91.81)    Time: 1610-9604 PT Time Calculation (min) (ACUTE ONLY): 30 min   Charges:   PT Evaluation $PT Eval Moderate Complexity: 1 Mod PT Treatments $Therapeutic Activity: 8-22 mins        Van Clines, PT  Acute Rehabilitation Services Pager [phone] Office [phone]   Levi Aland 08/10/2018, 9:04 AM

## 2018-08-10 NOTE — Progress Notes (Signed)
EEG completed, results pending. 

## 2018-08-10 NOTE — Procedures (Signed)
  [facility] Kofi A. Gerilyn Pilgrimoonquah, MD     [email or web address]           HISTORY: This is an 84 year old gentleman who presents with episode of lightheadedness and confusion.  The studies been done to evaluate for complex partial seizure as the etiology.  MEDICATIONS: Scheduled Meds: . amLODipine  10 mg Oral Daily  . aspirin  300 mg Rectal Daily   Or  . aspirin  325 mg Oral Daily  . atorvastatin  10 mg Oral q1800  . enoxaparin (LOVENOX) injection  40 mg Subcutaneous Q24H   Continuous Infusions: . sodium chloride Stopped (08/10/18 1130)   PRN Meds:.acetaminophen **OR** acetaminophen (TYLENOL) oral liquid 160 mg/5 mL **OR** acetaminophen, LORazepam, senna-docusate  Prior to Admission medications   Medication Sig Start Date End Date Taking? Authorizing Provider  amLODipine (NORVASC) 5 MG tablet Take 10 mg by mouth daily.    Yes [provider]  aspirin 81 MG tablet Take 81 mg by mouth daily.   Yes [provider]  atorvastatin (LIPITOR) 10 MG tablet Take 1 tablet by mouth Daily. 03/31/12  Yes Little, Caryn BeeKevin, MD  lisinopril (PRINIVIL,ZESTRIL) 20 MG tablet  02/22/12  Yes [provider]      ANALYSIS: A 16 channel recording using standard 10 20 measurements is conducted for 20 minutes.  There is a well-formed posterior dominant rhythm of 10 Hz which attenuates with eye-opening.  There is beta activity in the frontal areas.  Awake and drowsy activities are observed.  The recording is degraded at time with myogenic artifact.  It is also degraded at the end of the recording when the patient pulled on the leads.  Photic stimulation and hyperventilation are not carried out.  There is no focal or lateralized slowing.  There is no epileptiform activity is observed.   IMPRESSION: 1.  This recording of the awake and drowsy states is normal.      Kofi A. Gerilyn Pilgrimoonquah, M.D.  Diplomate, Biomedical engineerAmerican Board of Psychiatry and Neurology ( Neurology).

## 2018-08-10 NOTE — H&P (Signed)
History and Physical    Shane Juarez:[id number] DOB: 11-27-1929 DOA: 08/09/2018  Referring MD/NP/PA:   PCP: [name], MD   Patient coming from:  The patient is coming from home.  At baseline, pt is independent for most of ADL.        Chief Complaint: Altered mental status, syncope and right arm weakness.  HPI: Shane Juarez is a 84 y.o. male with medical history significant of hypertension, hyperlipidemia, stroke, CKD-2, who presents with altered mental status and syncope and right arm weakness.  Per pt's son, pt suddenly started having confusion and lightheadedness at about 12:30, and possibly blacked out for few seconds.  Patient slided down on the floor, without injury.  Patient also had short period of right arm weakness, which has resolved.  Denies facial droop, slurred speech, vision change or hearing loss. When I saw pt on the floor, he is still confused, oriented to place and person, but not to time.  He denies any chest pain, shortness of breath, cough, fever or chills.  No nausea, vomiting, diarrhea, abdominal pain, symptoms of UTI. ED physician consulted telemetry neurology, who recommended TIA versus seizure work-up.  ED Course: pt was found to have WBC 7.1, negative urinalysis, INR 1.04, PTT 23, negative troponin, negative UDS, alcohol level less than 10, worsening renal function, temperature normal, slightly tachycardia, no tachypnea, oxygen saturation 100% on room air.  CT head is negative for acute intracranial abnormalities, but showed progressed cerebral white matter disease since 2013, and bilateral cerebellar encephalomalacia, in part corresponding to the large right cerebellar infarct. Pt is placed in tele bed for obs.  Neurology, Dr. Amada Jupiter was consulted.  Review of Systems: Could not be reviewed accurately due to altered mental status.   Allergy: No Known Allergies  Past Medical History:  Diagnosis Date  . Hyperlipidemia   . Hypertension   . Stroke  [facility])     History reviewed. No pertinent surgical history.  Social History:  reports that he quit smoking about 51 years ago. His smoking use included cigarettes. He has never used smokeless tobacco. He reports that he does not drink alcohol or use drugs.  Family History:  Family History  Problem Relation Age of Onset  . Diabetes Sister   . Heart attack Sister      Prior to Admission medications   Medication Sig Start Date End Date Taking? Authorizing Provider  amLODipine (NORVASC) 5 MG tablet Take 10 mg by mouth daily.    Yes [provider]  aspirin 81 MG tablet Take 81 mg by mouth daily.   Yes [provider]  atorvastatin (LIPITOR) 10 MG tablet Take 1 tablet by mouth Daily. 03/31/12  Yes Little, Caryn Bee, MD  lisinopril (PRINIVIL,ZESTRIL) 20 MG tablet  02/22/12  Yes [provider]    Physical Exam: Vitals:   08/09/18 2217 08/10/18 0004 08/10/18 0204 08/10/18 0404  BP: (!) 129/52 (!) 131/55 (!) 149/65 (!) 129/52  Pulse: 87 89 86 74  Resp: 16 16    Temp: 98.3 F (36.8 C) 98.2 F (36.8 C) 97.8 F (36.6 C) 98.1 F (36.7 C)  TempSrc: Oral Oral Oral Oral  SpO2: 100% 100% 98%   Weight:      Height:       General: Not in acute distress HEENT:       Eyes: PERRL, EOMI, no scleral icterus.       ENT: No discharge from the ears and nose, no pharynx injection, no tonsillar  enlargement.        Neck: No JVD, no bruit, no mass felt. Heme: No neck lymph node enlargement. Cardiac: S1/S2, RRR, No murmurs, No gallops or rubs. Respiratory:  No rales, wheezing, rhonchi or rubs. GI: Soft, nondistended, nontender, no rebound pain, no organomegaly, BS present. GU: No hematuria Ext: No pitting leg edema bilaterally. 2+DP/PT pulse bilaterally. Musculoskeletal: No joint deformities, No joint redness or warmth, no limitation of ROM in spin. Skin: No rashes.  Neuro: confused, oriented to place and person, but not time, cranial nerves II-XII grossly intact, moves all  extremities normally. Psych: Patient is not psychotic, no suicidal or hemocidal ideation.  Labs on Admission: I have personally reviewed following labs and imaging studies  CBC: Recent Labs  Lab 08/09/18 1535  WBC 7.1  NEUTROABS 3.5  HGB 11.1*  HCT 34.4*  MCV 99.7  PLT 205   Basic Metabolic Panel: Recent Labs  Lab 08/09/18 1557  NA 137  K 4.3  CL 105  CO2 25  GLUCOSE 142*  BUN 20  CREATININE 1.40*  CALCIUM 8.4*  MG 2.2   GFR: Estimated Creatinine Clearance: 39.1 mL/min (A) (by C-G formula based on SCr of 1.4 mg/dL (H)). Liver Function Tests: Recent Labs  Lab 08/09/18 1557  AST 18  ALT 12  ALKPHOS 76  BILITOT 0.6  PROT 6.9  ALBUMIN 3.6   No results for input(s): LIPASE, AMYLASE in the last 168 hours. No results for input(s): AMMONIA in the last 168 hours. Coagulation Profile: Recent Labs  Lab 08/09/18 1557  INR 1.04   Cardiac Enzymes: Recent Labs  Lab 08/09/18 1535  TROPONINI <0.03   BNP (last 3 results) No results for input(s): PROBNP in the last 8760 hours. HbA1C: No results for input(s): HGBA1C in the last 72 hours. CBG: No results for input(s): GLUCAP in the last 168 hours. Lipid Profile: No results for input(s): CHOL, HDL, LDLCALC, TRIG, CHOLHDL, LDLDIRECT in the last 72 hours. Thyroid Function Tests: No results for input(s): TSH, T4TOTAL, FREET4, T3FREE, THYROIDAB in the last 72 hours. Anemia Panel: No results for input(s): VITAMINB12, FOLATE, FERRITIN, TIBC, IRON, RETICCTPCT in the last 72 hours. Urine analysis:    Component Value Date/Time   COLORURINE YELLOW 08/09/2018 1846   APPEARANCEUR CLEAR 08/09/2018 1846   LABSPEC 1.016 08/09/2018 1846   PHURINE 6.0 08/09/2018 1846   GLUCOSEU NEGATIVE 08/09/2018 1846   HGBUR NEGATIVE 08/09/2018 1846   BILIRUBINUR NEGATIVE 08/09/2018 1846   KETONESUR NEGATIVE 08/09/2018 1846   PROTEINUR NEGATIVE 08/09/2018 1846   UROBILINOGEN 0.2 01/06/2012 1335   NITRITE NEGATIVE 08/09/2018 1846    LEUKOCYTESUR NEGATIVE 08/09/2018 1846   Sepsis Labs: @LABRCNTIP (procalcitonin:4,lacticidven:4) )No results found for this or any previous visit (from the past 240 hour(s)).   Radiological Exams on Admission: Ct Head Code Stroke Wo Contrast  Addendum Date: 08/09/2018   ADDENDUM REPORT: 08/09/2018 15:52 ADDENDUM: Study discussed by telephone with PA SOPHIA CACCAVALE on 08/09/2018 at 1539 hours. Electronically Signed   By: Odessa FlemingH  Hall M.D.   On: 08/09/2018 15:52   Result Date: 08/09/2018 CLINICAL DATA:  Code stroke. 84 year old male with altered mental status. EXAM: CT HEAD WITHOUT CONTRAST TECHNIQUE: Contiguous axial images were obtained from the base of the skull through the vertex without intravenous contrast. COMPARISON:  Brain MRI 01/08/2012 and head CT 01/07/2012. 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 Patchy and confluent bilateral cerebral white matter hypodensity has mildly increased since 2013. Generalized cerebral volume loss  since 2013. No midline shift, ventriculomegaly, mass effect, evidence of mass lesion, intracranial hemorrhage or evidence of cortically based acute infarction. No cerebral cortical encephalomalacia identified. Vascular: Extensive Calcified atherosclerosis at the skull base. No suspicious intracranial vascular hyperdensity. Skull: Negative. Sinuses/Orbits: Visualized paranasal sinuses and mastoids are stable and well pneumatized. Other: No acute orbit or scalp soft tissue finding. ASPECTS [facility] Stroke Program Early CT Score) - Ganglionic level infarction (caudate, lentiform nuclei, internal capsule, insula, M1-M3 cortex): 7 - Supraganglionic infarction (M4-M6 cortex): 3 Total score (0-10 with 10 being normal): 10 IMPRESSION: 1. No acute intracranial hemorrhage or acute cortically based infarct identified. ASPECTS is 10. 2. Progressed cerebral white matter disease since 2013. Bilateral  cerebellar encephalomalacia, in part corresponding to the large right cerebellar infarct. Electronically Signed: By: Odessa Fleming M.D. On: 08/09/2018 15:36     EKG: Independently reviewed.  Sinus rhythm, QTC 477, early R wave progression, LAD, PAC, nonspecific T wave change.   Assessment/Plan Principal Problem:   Acute metabolic encephalopathy Active Problems:   CVA (cerebral vascular accident) (HCC)   HTN (hypertension)   Syncope   HLD (hyperlipidemia)   Acute renal failure superimposed on stage 3 chronic kidney disease (HCC)   Acute metabolic encephalopathy, syncope and transient right arm weakness: Etiology is not clear, potential differential diagnosis include TIA/stroke, seizure or cardiac arrhythmia. Tele neurology recommended TIA versus seizure work-up.  We also consulted our neurologist, Dr. Amada Jupiter.   - will place on tele bed for obs - will follow up Neurology's Recs.  - Obtain MRI/MRA  - EEG - ASA  - fasting lipid panel and HbA1c  - 2D transthoracic echocardiography  - swallowing screen. If fails, will get SLP - Check UDS  - PT/OT consult  Hx of CVA (cerebral vascular accident) (HCC): -asa and lipitor  HTN:  -Continue home medications: Amlodipine, lisinopril -IV hydralazine prn  HLD (hyperlipidemia): -Lipitor  AoCKD-III: Baseline Cre is 1.0, pt's Cre is 1.40 and BUN 20 on admission. Likely due to prerenal secondary to dehydration and continuation of ACEI - IVF: 125 cc/h of Ns - Follow up renal function by BMP - Hold lisinopril  DVT ppx: SQ Lovenox Code Status: Full code Family Communication:  Yes, patient's son at bed side Disposition Plan:  Anticipate discharge back to previous home environment Consults called: Neurology, Dr. Amada Jupiter Admission status: Obs / tele      Date of Service 08/10/2018    Lorretta Harp [facility] Pager [phone]  If 7PM-7AM, please contact night-coverage [email or web address] Password [facility] 08/10/2018, 6:17 AM

## 2018-08-10 NOTE — Progress Notes (Signed)
  Echocardiogram 2D Echocardiogram has been performed.  Shane SavoyCasey N Kirkpatrick 08/10/2018, 9:24 AM

## 2018-08-10 NOTE — Care Management Obs Status (Addendum)
MEDICARE OBSERVATION STATUS NOTIFICATION   Patient Details  Name: Shane Juarez MRN: [id number] Date of Birth: 01/28/1930   Medicare Observation Status Notification Given:  Yes  Explained to wife, Shane Juarez, over the phone.  Copy left in patient's room.   Deveron FurlongAshley  Goldean, RN 08/10/2018, 3:19 PM

## 2018-08-10 NOTE — Progress Notes (Signed)
Pt. Became agitated and anxious, attempting to leave facility after a phone call with his wife. Alert only to self. Sons arrived and attempted to help calm the patient. Pt. Remained agitated after haldol given and family presence. MD aware, awaiting discharge paperwork.

## 2018-08-10 NOTE — Progress Notes (Signed)
Pt. Education provided to patient and pts. son Sherilyn CooterHenry. Pt. Discharged from facility via wheelchair.

## 2018-08-10 NOTE — Consult Note (Signed)
Neurology Consultation Reason for Consult: Transient episode of weakness Referring Physician: Patient  CC: Transient episode of weakness  History is obtained from: Patient  HPI: Shane Juarez is a 84 y.o. male he states that he was feeling okay when all of a sudden he felt "dizzy" and generally weak.  He initially leaned up against the wall and then had to sit down.  He felt lightheaded and like he needed to go to sleep.  He apparently had some confusion and disorientation around the time of this.  He says has returned to baseline.  He denies any focal weakness or numbness.  There is some concern for right upper extremity weakness, but the patient denies that this ever was present.  He has been admitted for further evaluation.  He does not have any history of seizures, staring spells, or other episodic stereotyped spells.  He did have a history of cerebellar stroke in 2013.  He apparently has some difficulty with memory at baseline, and his son states that he would not know the year even normally.   ROS: A 14 point ROS was performed and is negative except as noted in the HPI.   Past Medical History:  Diagnosis Date  . Hyperlipidemia   . Hypertension   . Stroke [facility])      Family History  Problem Relation Age of Onset  . Diabetes Sister   . Heart attack Sister      Social History:  reports that he quit smoking about 51 years ago. His smoking use included cigarettes. He has never used smokeless tobacco. He reports that he does not drink alcohol or use drugs.   Exam: Current vital signs: BP (!) 131/55 (BP Location: Left Arm)   Pulse 89   Temp 98.2 F (36.8 C) (Oral)   Resp 16   Ht 5\' 10"  (1.778 m)   Wt 80 kg   SpO2 100%   BMI 25.31 kg/m  Vital signs in last 24 hours: Temp:  [97.9 F (36.6 C)-98.3 F (36.8 C)] 98.2 F (36.8 C) (11/17 0004) Pulse Rate:  [82-106] 89 (11/17 0004) Resp:  [12-17] 16 (11/17 0004) BP: (129-178)/(52-90) 131/55 (11/17 0004) SpO2:  [96 %-100  %] 100 % (11/17 0004) Weight:  [80 kg] 80 kg (11/16 1554)   Physical Exam  Constitutional: Appears well-developed and well-nourished.  Psych: Affect appropriate to situation Eyes: No scleral injection HENT: No OP obstrucion Head: Normocephalic.  Cardiovascular: Normal rate and regular rhythm.  Respiratory: Effort normal, non-labored breathing GI: Soft.  No distension. There is no tenderness.  Skin: WDI  Neuro: Mental Status: Patient is awake, alert, he is not oriented to month or year Patient is able to give a clear and coherent history. No signs of aphasia  Cranial Nerves: II: Visual Fields are full. Pupils are equal, round, and reactive to light.   III,IV, VI: EOMI without ptosis or diploplia.  V: Facial sensation is symmetric to temperature VII: Facial movement is symmetric.  VIII: hearing is intact to voice X: Uvula elevates symmetrically XI: Shoulder shrug is symmetric. XII: tongue is midline without atrophy or fasciculations.  Motor: Tone is normal. Bulk is normal. 5/5 strength was present in all four extremities.  Sensory: Sensation is symmetric to light touch and temperature in the arms and legs. Cerebellar: No clear ataxia   I have reviewed labs in epic and the results pertinent to this consultation are: CMP-mildly elevated creatinine at 1.4  I have reviewed the images obtained: CT head-unremarkable  Impression: 84 year old male with transient episode of lightheadedness and disorientation.  It is not uncommon for people with memory difficulty to have more disorientation with presyncope than would be expected with a younger patient.  I think that transient ischemic attack is less likely, though if a brain MRI were to show ischemic infarct then I would perform any work-up at that time.  Recommendations: 1) MRI brain, MRA head and neck 2) presyncopal work-up per internal medicine 3) EEG 4) neurology will follow   Ritta SlotMcNeill Kirkpatrick, MD [facility] [phone]  If 7pm- 7am, please page neurology on call as listed in AMION.

## 2018-08-10 NOTE — Care Management Note (Addendum)
Case Management Note  Patient Details  Name: Shane RisingFrank D Demeyer MRN: [id number] Date of Birth: 04/18/1930  Subjective/Objective:     Pt from home with family for confusion.  Per MD, anticipate DC home tomorrow.                 Action/Plan: Discussed with wife, [name], over the phone.  Wife states they have a RW but do not have a 3n1.  [facility]  [facility]  [facility] unable to serve due to lack of OT.    [facility] will deliver 3n1 to pt's room today.  Expected Discharge Date:       08/11/18           Expected Discharge Plan:  Home w Home Health Services  In-House Referral:  NA  Discharge planning Services  CM Consult  Post Acute Care Choice:  Durable Medical Equipment, Home Health Choice offered to:  Spouse  DME Arranged:  3-N-1 DME Agency:  [facility]  HH Arranged[facility]  [facility]  Status of Service:  In process, will continue to follow  If discussed at Long Length of Stay Meetings, dates discussed:    Additional Comments:  Deveron Furlongshley  Goldean, RN 08/10/2018, 3:22 PM

## 2018-08-12 LAB — CBG MONITORING, ED: Glucose-Capillary: 138 mg/dL — ABNORMAL HIGH (ref 70–99)

## 2018-08-22 ENCOUNTER — Encounter (HOSPITAL_COMMUNITY): Payer: Self-pay | Admitting: Emergency Medicine

## 2018-08-22 ENCOUNTER — Emergency Department (HOSPITAL_COMMUNITY)
Admission: EM | Admit: 2018-08-22 | Discharge: 2018-08-23 | Disposition: A | Discharge status: Home / Self Care | Payer: Medicare Other | Attending: Emergency Medicine | Admitting: Emergency Medicine

## 2018-08-22 ENCOUNTER — Emergency Department (HOSPITAL_COMMUNITY): Payer: Medicare Other

## 2018-08-22 ENCOUNTER — Other Ambulatory Visit: Payer: Self-pay

## 2018-08-22 DIAGNOSIS — N183 Chronic kidney disease, stage 3 (moderate): Secondary | ICD-10-CM | POA: Diagnosis not present

## 2018-08-22 DIAGNOSIS — I129 Hypertensive chronic kidney disease with stage 1 through stage 4 chronic kidney disease, or unspecified chronic kidney disease: Secondary | ICD-10-CM | POA: Diagnosis not present

## 2018-08-22 DIAGNOSIS — Z87891 Personal history of nicotine dependence: Secondary | ICD-10-CM | POA: Insufficient documentation

## 2018-08-22 DIAGNOSIS — R4689 Other symptoms and signs involving appearance and behavior: Secondary | ICD-10-CM | POA: Diagnosis not present

## 2018-08-22 DIAGNOSIS — Z7982 Long term (current) use of aspirin: Secondary | ICD-10-CM | POA: Diagnosis not present

## 2018-08-22 DIAGNOSIS — Z79899 Other long term (current) drug therapy: Secondary | ICD-10-CM | POA: Diagnosis not present

## 2018-08-22 DIAGNOSIS — R4182 Altered mental status, unspecified: Secondary | ICD-10-CM | POA: Diagnosis present

## 2018-08-22 LAB — CBC
HCT: 35.1 % — ABNORMAL LOW (ref 39.0–52.0)
Hemoglobin: 11.3 g/dL — ABNORMAL LOW (ref 13.0–17.0)
MCH: 32 pg (ref 26.0–34.0)
MCHC: 32.2 g/dL (ref 30.0–36.0)
MCV: 99.4 fL (ref 80.0–100.0)
Platelets: 254 10*3/uL (ref 150–400)
RBC: 3.53 MIL/uL — ABNORMAL LOW (ref 4.22–5.81)
RDW: 14.6 % (ref 11.5–15.5)
WBC: 7 10*3/uL (ref 4.0–10.5)
nRBC: 0 % (ref 0.0–0.2)

## 2018-08-22 NOTE — ED Triage Notes (Addendum)
Pt was brought to the hospital by his sister after he called her to tell her he was having thoughts of harming his wife.  Pt states "I've lost my mind."  Pt states the thoughts are gone but expresses that he it scared him.  Not oriented to time,date.  "I'm mixed up".

## 2018-08-22 NOTE — ED Provider Notes (Signed)
[facility]   CSN: [id number] Arrival date & time: 08/22/18  2302     History   Chief Complaint Chief Complaint  Patient presents with  . Altered Mental Status    HPI Shane Juarez is a 84 y.o. male.  Level 5 caveat for suspected dementia.  Patient here with his daughter.  He states he is here because he "lost his mind".  He was reportedly becoming aggressive with his wife and locked her out of the house. Police were not contacted.  Patient states he does not know why he did that this and is regretful and tearful.  He denies any physical complaints.  He is here with his daughter whom he initially identified as his sister.  His daughter states that he has intermittent episodes of confusion and she suspects dementia though he is never been formally diagnosed with it.  Patient's wife called his daughter and the patient came out of the house when the daughter came over.  Denies any recent medication changes.  This is the first time he has been aggressive.  There have been no fevers or other infectious symptoms.  He is been eating and drinking well.  He was admitted to the hospital 2 weeks ago with near syncope and transient left-sided weakness with a negative work-up. Patient denies any suicidal or homicidal thoughts currently.  Denies any hallucinations.  The history is provided by the patient and a relative. The history is limited by the condition of the patient.  Altered Mental Status      Past Medical History:  Diagnosis Date  . Hyperlipidemia   . Hypertension   . Stroke [facility](HCC)     Patient Active Problem List   Diagnosis Date Noted  . HLD (hyperlipidemia) 08/10/2018  . Acute renal failure superimposed on stage 3 chronic kidney disease (HCC) 08/10/2018  . Acute metabolic encephalopathy 08/10/2018  . TIA (transient ischemic attack) 08/10/2018  . Syncope 08/09/2018  . 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 CVA (cerebral vascular accident) (HCC) 01/06/2012  . HTN (hypertension) 01/06/2012    History reviewed. No pertinent surgical history.      Home Medications    Prior to Admission medications   Medication Sig Start Date End Date Taking? Authorizing Provider  amLODipine (NORVASC) 5 MG tablet Take 10 mg by mouth daily.     [provider]  aspirin 81 MG tablet Take 81 mg by mouth daily.    [provider]  atorvastatin (LIPITOR) 10 MG tablet Take 1 tablet by mouth Daily. 03/31/12   Catha GosselinLittle, Kevin, MD  lisinopril (PRINIVIL,ZESTRIL) 20 MG tablet  02/22/12   [provider]    Family History Family History  Problem Relation Age of Onset  . Diabetes Sister   . Heart attack Sister     Social History Social History   Tobacco Use  . Smoking status: Former Smoker    Types: Cigarettes    Last attempt to quit: 04/02/1967    Years since quitting: 51.4  . Smokeless tobacco: Never Used  Substance Use Topics  . Alcohol use: No    Alcohol/week: 0.0 standard drinks  . Drug use: No     Allergies   Patient has no known allergies.   Review of Systems Review of Systems  Unable to perform ROS: Dementia     Physical Exam Updated Vital Signs BP (!) 166/70  Pulse 90   Temp 98.2 F (36.8 C) (Oral)   Resp 16   Ht 5\' 7"  (1.702 m)   SpO2 97%   BMI 27.62 kg/m   Physical Exam  Constitutional: He appears well-developed and well-nourished. No distress.  Oriented to person and place.  HENT:  Head: Normocephalic and atraumatic.  Mouth/Throat: Oropharynx is clear and moist. No oropharyngeal exudate.  Eyes: Pupils are equal, round, and reactive to light. Conjunctivae and EOM are normal.  Neck: Normal range of motion. Neck supple.  No meningismus.  Cardiovascular: Normal rate, regular rhythm, normal heart sounds and intact distal pulses.  No murmur  heard. Pulmonary/Chest: Effort normal and breath sounds normal. No respiratory distress.  Abdominal: Soft. There is no tenderness. There is no rebound and no guarding.  Musculoskeletal: Normal range of motion. He exhibits no edema or tenderness.  Neurological: He is alert. No cranial nerve deficit. He exhibits normal muscle tone. Coordination normal.  No ataxia on finger to nose bilaterally. No pronator drift. 5/5 strength throughout. CN 2-12 intact.Equal grip strength. Sensation intact.   Skin: Skin is warm.  Psychiatric: He has a normal mood and affect. His behavior is normal.  Nursing note and vitals reviewed.    ED Treatments / Results  Labs (all labs ordered are listed, but only abnormal results are displayed) Labs Reviewed  COMPREHENSIVE METABOLIC PANEL - Abnormal; Notable for the following components:      Result Value   Glucose, Bld 120 (*)    Calcium 8.5 (*)    GFR calc non Af Amer 53 (*)    All other components within normal limits  CBC - Abnormal; Notable for the following components:   RBC 3.53 (*)    Hemoglobin 11.3 (*)    HCT 35.1 (*)    All other components within normal limits  ACETAMINOPHEN LEVEL - Abnormal; Notable for the following components:   Acetaminophen (Tylenol), Serum <10 (*)    All other components within normal limits  TSH - Abnormal; Notable for the following components:   TSH 4.610 (*)    All other components within normal limits  ETHANOL  RAPID URINE DRUG SCREEN, HOSP PERFORMED  URINALYSIS, ROUTINE W REFLEX MICROSCOPIC  SALICYLATE LEVEL  CBG MONITORING, ED    EKG EKG Interpretation  Date/Time:  Friday August 22 2018 23:33:29 EST Ventricular Rate:  86 PR Interval:    QRS Duration: 79 QT Interval:  373 QTC Calculation: 447 R Axis:   -42 Text Interpretation:  Sinus rhythm Left axis deviation Abnormal R-wave progression, early transition No significant change was found Confirmed by Glynn Octave [phone]) on 08/22/2018 11:54:00  PM   Radiology Dg Chest 2 View  Result Date: 08/23/2018 CLINICAL DATA:  Altered mental status. EXAM: CHEST - 2 VIEW COMPARISON:  01/07/2012 FINDINGS: Cardiac silhouette is normal in size and configuration. No mediastinal or hilar masses. No evidence of adenopathy. Clear lungs.  No pleural effusion or pneumothorax. Skeletal structures are intact. IMPRESSION: No active cardiopulmonary disease. Electronically Signed   By: Amie Portland M.D.   On: 08/23/2018 00:13   Ct Head Wo Contrast  Result Date: 08/23/2018 CLINICAL DATA:  Pt was brought to the hospital by his sister after he called her to tell her he was having thoughts of harming his wife. Pt states "I've lost my mind." EXAM: CT HEAD WITHOUT CONTRAST TECHNIQUE: Contiguous axial images were obtained from the base of the skull through the vertex without intravenous contrast. COMPARISON:  Brain MRI, 08/10/2018.  Head CT,  08/09/2018. FINDINGS: Brain: No evidence of acute infarction, hemorrhage, hydrocephalus, extra-axial collection or mass lesion/mass effect. Right cerebellar encephalomalacia reflecting an old right PICA distribution infarct. Smaller area of infarction along the inferior left cerebellum. Patchy areas of bilateral white matter hypoattenuation are also noted consistent with moderate chronic microvascular ischemic change. Stable volume loss reflecting mild to moderate diffuse atrophy. Vascular: No hyperdense vessel or unexpected calcification. Skull: Normal. Negative for fracture or focal lesion. Sinuses/Orbits: Globes and orbits are unremarkable. Sinuses and mastoid air cells are clear. Other: None. IMPRESSION: 1. No acute intracranial abnormalities. 2. Old cerebellar infarcts. Atrophy and moderate chronic microvascular ischemic change. Electronically Signed   By: Amie Portland M.D.   On: 08/23/2018 00:06    Procedures Procedures (including critical care time)  Medications Ordered in ED Medications - No data to display   Initial  Impression / Assessment and Plan / ED Course  I have reviewed the triage vital signs and the nursing notes.  Pertinent labs & imaging results that were available during my care of the patient were reviewed by me and considered in my medical decision making (see chart for details).    Patient with suspected dementia from home with aggressive behavior.  He is calm and cooperative.  Denies any suicidal or homicidal thoughts.  We will proceed with medical altered mental status work-up.\  Patient's daughter and wife are at bedside.  They do feel comfortable taking patient home.  He denies any suicidal or homicidal thoughts.  Urinalysis is negative, drug screen is negative.  CT head and chest x-ray are stable. Labs reassuring.  TSH is minimally elevated.  Suspect patient's aggressive behavior is due to undiagnosed dementia.  No evidence of acute infection.  He is calm and cooperative.  TTS consult will be obtained. He is medically clear for psychiatric evaluation.   Patient seen by TTS.  He does not meet inpatient criteria.  Family is comfortable taking him home and feels safe.  TTS recommends outpatient geropsychiatry consultation.  Patient tolerating PO and ambulatory. Family comfortable taking him home with PCP and geropsych followup.  Return precautions discussed.   Final Clinical Impressions(s) / ED Diagnoses   Final diagnoses:  Aggressive behavior    ED Discharge Orders    None       Rancour, Jeannett Senior, MD 08/23/18 0900

## 2018-08-23 ENCOUNTER — Emergency Department (HOSPITAL_COMMUNITY): Payer: Medicare Other

## 2018-08-23 LAB — URINALYSIS, ROUTINE W REFLEX MICROSCOPIC
Bilirubin Urine: NEGATIVE
Glucose, UA: NEGATIVE mg/dL
Hgb urine dipstick: NEGATIVE
Ketones, ur: NEGATIVE mg/dL
Leukocytes, UA: NEGATIVE
Nitrite: NEGATIVE
Protein, ur: NEGATIVE mg/dL
Specific Gravity, Urine: 1.017 (ref 1.005–1.030)
pH: 6 (ref 5.0–8.0)

## 2018-08-23 LAB — COMPREHENSIVE METABOLIC PANEL
ALT: 12 U/L (ref 0–44)
AST: 17 U/L (ref 15–41)
Albumin: 3.5 g/dL (ref 3.5–5.0)
Alkaline Phosphatase: 70 U/L (ref 38–126)
Anion gap: 7 (ref 5–15)
BUN: 16 mg/dL (ref 8–23)
CO2: 25 mmol/L (ref 22–32)
Calcium: 8.5 mg/dL — ABNORMAL LOW (ref 8.9–10.3)
Chloride: 109 mmol/L (ref 98–111)
Creatinine, Ser: 1.22 mg/dL (ref 0.61–1.24)
GFR calc Af Amer: >60 mL/min (ref >60)
GFR calc non Af Amer: 53 mL/min — ABNORMAL LOW (ref >60)
Glucose, Bld: 120 mg/dL — ABNORMAL HIGH (ref 70–99)
Potassium: 4 mmol/L (ref 3.5–5.1)
Sodium: 141 mmol/L (ref 135–145)
Total Bilirubin: 0.6 mg/dL (ref 0.3–1.2)
Total Protein: 6.9 g/dL (ref 6.5–8.1)

## 2018-08-23 LAB — TSH: TSH: 4.61 u[IU]/mL — ABNORMAL HIGH (ref 0.350–4.500)

## 2018-08-23 LAB — ETHANOL: Alcohol, Ethyl (B): <10 mg/dL (ref <10)

## 2018-08-23 LAB — SALICYLATE LEVEL: Salicylate Lvl: <7 mg/dL (ref 2.8–30.0)

## 2018-08-23 LAB — RAPID URINE DRUG SCREEN, HOSP PERFORMED
Amphetamines: NOT DETECTED
Barbiturates: NOT DETECTED
Benzodiazepines: NOT DETECTED
Cocaine: NOT DETECTED
Opiates: NOT DETECTED
Tetrahydrocannabinol: NOT DETECTED

## 2018-08-23 LAB — ACETAMINOPHEN LEVEL: Acetaminophen (Tylenol), Serum: <10 ug/mL — ABNORMAL LOW (ref 10–30)

## 2018-08-23 NOTE — ED Notes (Signed)
Family members all understand discharge instructions.

## 2018-08-23 NOTE — Discharge Instructions (Signed)
Your medical work-up is reassuring.  Follow-up with your primary doctor.  You should have a consultation with a geropsychiatrist.  Return to the ED with new or worsening symptoms.

## 2018-08-23 NOTE — BH Assessment (Signed)
Tele Assessment Note   Patient Name: Shane Juarez MRN: [id number] Referring Physician: Dr. Manus Gunning Location of Patient: [facility] Location of Provider: Behavioral Health TTS Department  Shane Juarez is an 84 y.o. male.  -Clinician reviewed note by Dr. Manus Gunning.  Level 5 caveat for suspected dementia.  Patient here with his daughter.  He states he is here because he "lost his mind".  He was reportedly becoming aggressive with his wife and locked her out of the house.  Patient denies any suicidal or homicidal thoughts currently.  Denies any hallucinations.  Patient is accompanied by wife and daughter during assessment and consents to them being present.  Patient says he "got upset" tonight and had some conflict with his wife.  Patient had locked his wife out of house.  He told this clinician that wife had left the house and had locked the house when she left.  He says he "lost it."  He says he did think of harming wife earlier but not now.    Patient denies any current SI or A/V hallucinations.  No use of ETOH or illicit drugs.  Patient has no previous inpatient or outpatient mental health care.  Patient has guns in the home and they are secured.  Patient appears tired, has a flat affect.  When he came in he was tearful and was regretful of his actions.  He told nurse Victorino Dike that his daughter was his sister.  He later corrected himself.  He is oriented x3.  When asked, patient says he feels safe returning home.  Patient's wife and daughter say that they are okay with patient returning home.  -Clinician dicussed patient care with Nira Conn, FNP.  He did not recommend inpatient care at this time.  He did say that primary care physician should look into a referral for a gero psychiatric provider.  Clinician talked with Dr. Manus Gunning about disposition.  Patient will return home with wife and daughter.  Diagnosis: Altered mental status  Past Medical History:  Past Medical History:  Diagnosis Date   . Hyperlipidemia   . Hypertension   . Stroke [facility])     History reviewed. No pertinent surgical history.  Family History:  Family History  Problem Relation Age of Onset  . Diabetes Sister   . Heart attack Sister     Social History:  reports that he quit smoking about 51 years ago. His smoking use included cigarettes. He has never used smokeless tobacco. He reports that he does not drink alcohol or use drugs.  Additional Social History:  Alcohol / Drug Use Pain Medications: See PTA medication list Prescriptions: See PTA medication list Over the Counter: See PTA medication list History of alcohol / drug use?: No history of alcohol / drug abuse  CIWA: CIWA-Ar BP: (!) 162/75 Pulse Rate: 81 COWS:    Allergies: No Known Allergies  Home Medications:  (Not in a hospital admission)  OB/GYN Status:  No LMP for male patient.  General Assessment Data Location of Assessment: [facility] ED TTS Assessment: In system Is this a Tele or Face-to-Face Assessment?: Tele Assessment Is this an Initial Assessment or a Re-assessment for this encounter?: Initial Assessment Patient Accompanied by:: Other(Daughter and wife.) Language Other than English: No Living Arrangements: Other (Comment)(Lives with wife) What gender do you identify as?: Male Marital status: Married Pregnancy Status: No Living Arrangements: Spouse/significant other Can pt return to current living arrangement?: Yes Admission Status: Voluntary Is patient capable of signing voluntary admission?: Yes Referral Source: Self/Family/Friend  Insurance type: [facility]     Crisis Care Plan Living Arrangements: Spouse/significant other Name of Psychiatrist: None Name of Therapist: NONE  Education Status Is patient currently in school?: No Is the patient employed, unemployed or receiving disability?: Unemployed  Risk to self with the past 6 months Suicidal Ideation: No Has patient been a risk to self within the past 6 months prior to  admission? : No Suicidal Intent: No Has patient had any suicidal intent within the past 6 months prior to admission? : No Is patient at risk for suicide?: No Suicidal Plan?: No Has patient had any suicidal plan within the past 6 months prior to admission? : No Access to Means: No What has been your use of drugs/alcohol within the last 12 months?: None Previous Attempts/Gestures: No How many times?: 0 Other Self Harm Risks: None Triggers for Past Attempts: None known Intentional Self Injurious Behavior: None Family Suicide History: No Recent stressful life event(s): Conflict (Comment)(Pt says he was "acting up.") Persecutory voices/beliefs?: No Depression: No Depression Symptoms: (Denies depressive symptoms) Substance abuse history and/or treatment for substance abuse?: No Suicide prevention information given to non-admitted patients: Not applicable  Risk to Others within the past 6 months Homicidal Ideation: No Does patient have any lifetime risk of violence toward others beyond the six months prior to admission? : No Thoughts of Harm to Others: No-Not Currently Present/Within Last 6 Months(Pt had locked wife out of house.) Current Homicidal Intent: No Current Homicidal Plan: No Access to Homicidal Means: No Identified Victim: No one History of harm to others?: No Assessment of Violence: None Noted Violent Behavior Description: None reported Does patient have access to weapons?: Yes (Comment)(Guns are secured.) Criminal Charges Pending?: No Does patient have a court date: No Is patient on probation?: No  Psychosis Hallucinations: None noted Delusions: None noted  Mental Status Report Appearance/Hygiene: Disheveled, In hospital gown Eye Contact: Fair Motor Activity: Freedom of movement, Unremarkable Speech: Logical/coherent Level of Consciousness: Quiet/awake Mood: Anxious, Helpless Affect: Apprehensive Anxiety Level: Minimal Thought Processes: Coherent,  Relevant Judgement: Unimpaired Orientation: Person, Place, Situation Obsessive Compulsive Thoughts/Behaviors: None  Cognitive Functioning Concentration: Decreased Memory: Recent Impaired, Remote Intact Is patient IDD: No Insight: Poor Impulse Control: Fair Appetite: Good Have you had any weight changes? : No Change Sleep: No Change Total Hours of Sleep: 9 Vegetative Symptoms: None  ADLScreening [facility]) Patient's cognitive ability adequate to safely complete daily activities?: Yes Patient able to express need for assistance with ADLs?: Yes Independently performs ADLs?: Yes (appropriate for developmental age)  Prior Inpatient Therapy Prior Inpatient Therapy: No  Prior Outpatient Therapy Prior Outpatient Therapy: No Does patient have an ACCT team?: No Does patient have Intensive In-House Services?  : No Does patient have [facility] services? : No Does patient have P4CC services?: No  ADL Screening (condition at time of admission) Patient's cognitive ability adequate to safely complete daily activities?: Yes Is the patient deaf or have difficulty hearing?: No Does the patient have difficulty seeing, even when wearing glasses/contacts?: Yes(Has glasses but they got broken about a year ago.) Does the patient have difficulty concentrating, remembering, or making decisions?: No Patient able to express need for assistance with ADLs?: Yes Does the patient have difficulty dressing or bathing?: No Independently performs ADLs?: Yes (appropriate for developmental age) Does the patient have difficulty walking or climbing stairs?: No Weakness of Legs: None Weakness of Arms/Hands: None       Abuse/Neglect Assessment (Assessment to be complete while patient is alone) Abuse/Neglect Assessment  Can Be Completed: Yes Physical Abuse: Denies Sexual Abuse: Denies Exploitation of patient/patient's resources: Denies Self-Neglect: Denies     [employer]chant [employer] officer (For  Healthcare) Does Patient Have a Medical Advance Directive?: Yes Does patient want to make changes to medical advance directive?: Yes (ED - Information included in AVS) Type of Advance Directive: Healthcare Power of Attorney, Living will Copy of Healthcare Power of Attorney in Chart?: No - copy requested Copy of Living Will in Chart?: No - copy requested          Disposition:  Disposition Initial Assessment Completed for this Encounter: Yes Patient referred to: Other (Comment)(Pt to be reviewed by FNP)  This service was provided via telemedicine using a 2-way, interactive audio and video technology.  Names of all persons participating in this telemedicine service and their role in this encounter. Name: Shane Juarez Role: patient  Name: Tasia Catchings Role: wife  Name: Norman Clay Role: daughter  Name: Beatriz Stallion, M.S. LCAS QP Role: patient    Alexandria Lodge 08/23/2018 2:09 AM

## 2018-10-23 ENCOUNTER — Institutional Professional Consult (permissible substitution): Payer: Medicare PPO | Admitting: Neurology

## 2018-12-03 ENCOUNTER — Ambulatory Visit: Payer: Self-pay | Admitting: Neurology

## 2018-12-03 ENCOUNTER — Telehealth: Payer: Self-pay

## 2018-12-03 NOTE — Telephone Encounter (Signed)
Patient no show for new appt today. 

## 2018-12-04 ENCOUNTER — Encounter: Payer: Self-pay | Admitting: Neurology

## 2019-06-08 ENCOUNTER — Other Ambulatory Visit (HOSPITAL_BASED_OUTPATIENT_CLINIC_OR_DEPARTMENT_OTHER): Payer: Self-pay | Admitting: Family Medicine

## 2019-06-08 ENCOUNTER — Other Ambulatory Visit: Payer: Self-pay

## 2019-06-08 ENCOUNTER — Ambulatory Visit (HOSPITAL_BASED_OUTPATIENT_CLINIC_OR_DEPARTMENT_OTHER)
Admission: RE | Admit: 2019-06-08 | Discharge: 2019-06-08 | Disposition: A | Discharge status: Home / Self Care | Payer: Medicare Other | Source: Ambulatory Visit | Attending: Family Medicine | Admitting: Family Medicine

## 2019-06-08 DIAGNOSIS — S0990XA Unspecified injury of head, initial encounter: Secondary | ICD-10-CM | POA: Diagnosis present

## 2019-12-15 IMAGING — MR MR MRA HEAD W/O CM
1 of 2 series · 17 of 48 positions shown · IV contrast (gadavist)
Comparison: Head CT 08/09/2018 and MRI/MRA 01/08/2012

CLINICAL DATA: Transient episode of lightheadedness and
disorientation. Questionable right-sided weakness.



[Series 5: ax (id) 2 · axial · 1.0mm · 0.43mm/px · z∈[-175,-87]mm · 17 of 184 slices shown]
[im 1/184]
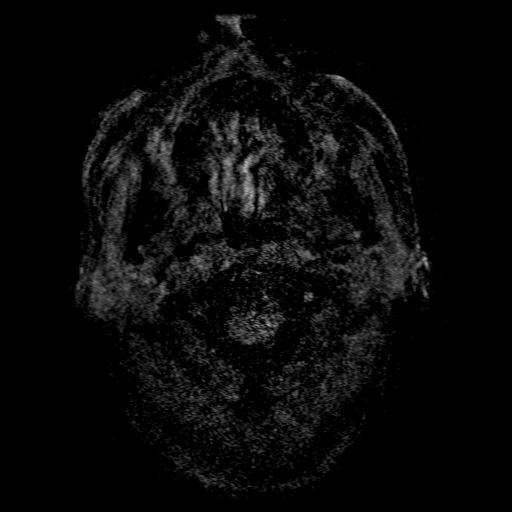
[im 7/184]
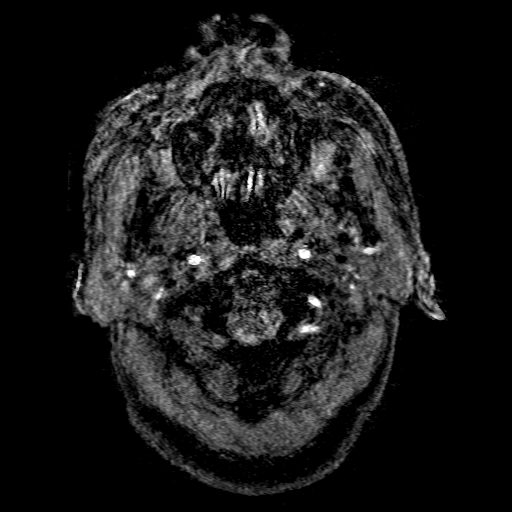
[im 14/184]
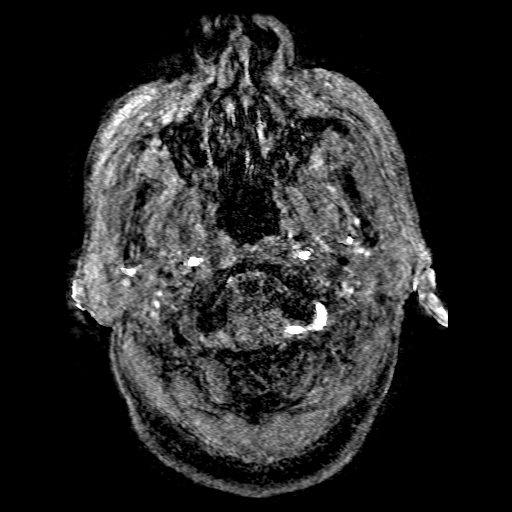
[im 21/184]
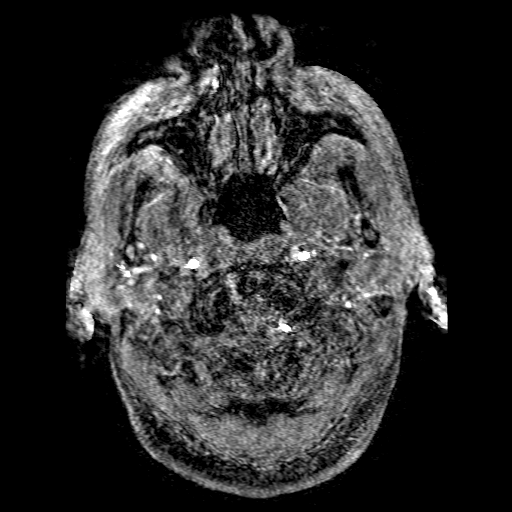
[im 28/184]
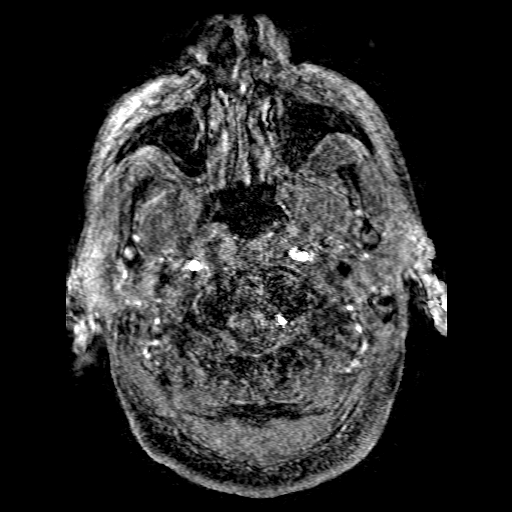
[im 34/184]
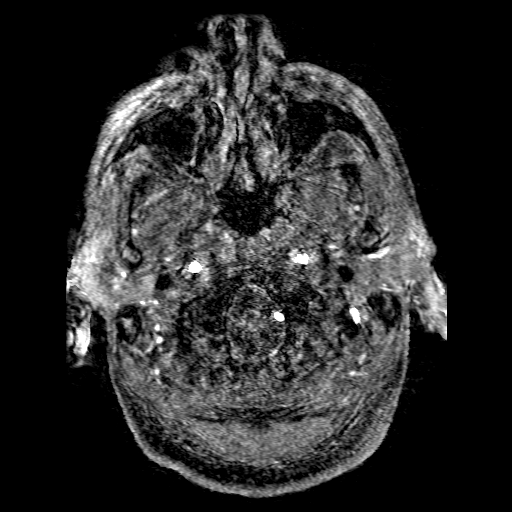
[im 41/184]
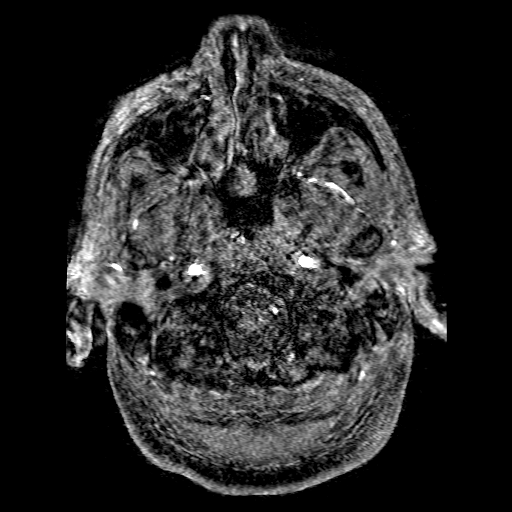
[im 48/184]
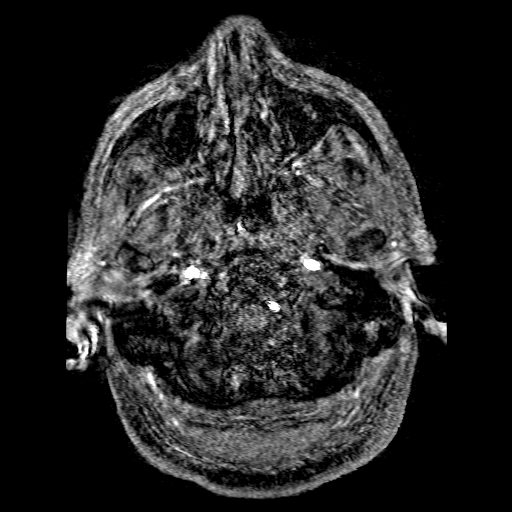
[im 55/184]
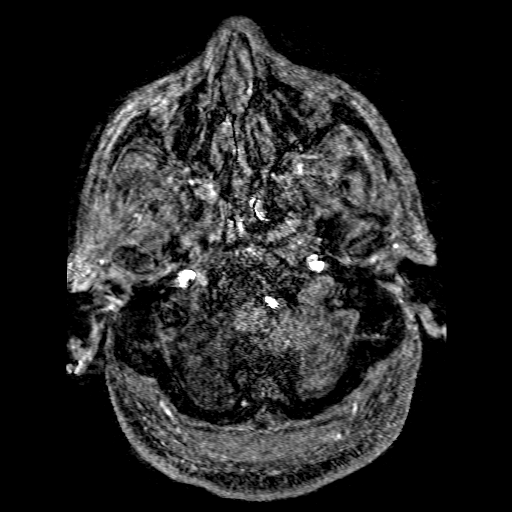
[im 62/184]
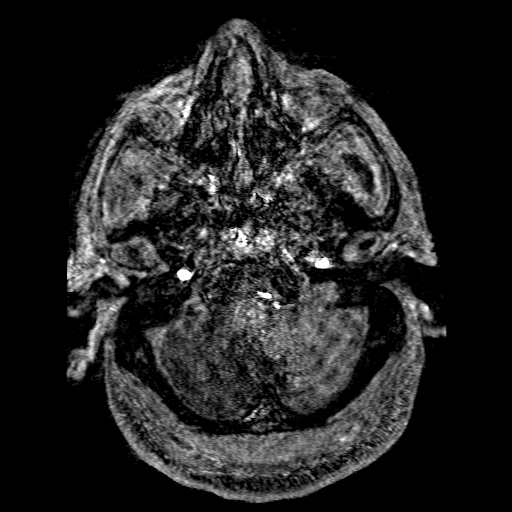
[im 82/184]
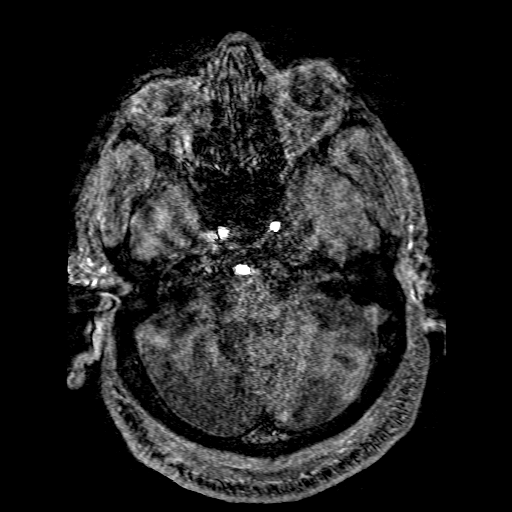
[im 95/184]
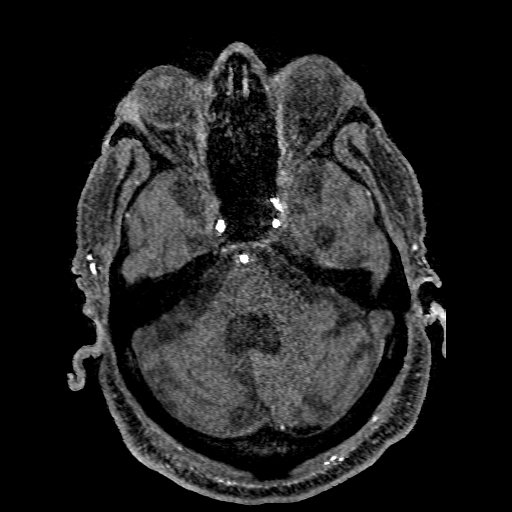
[im 102/184]
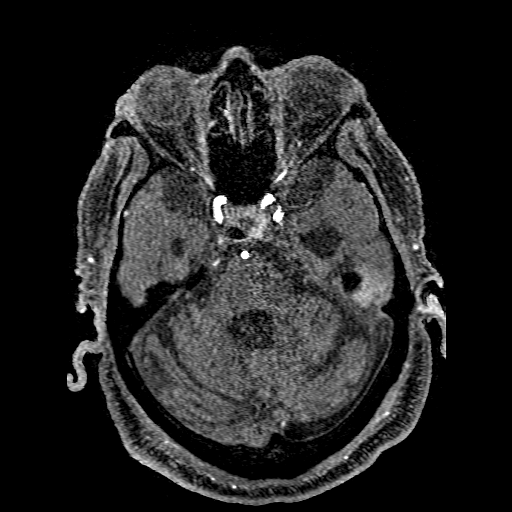
[im 129/184]
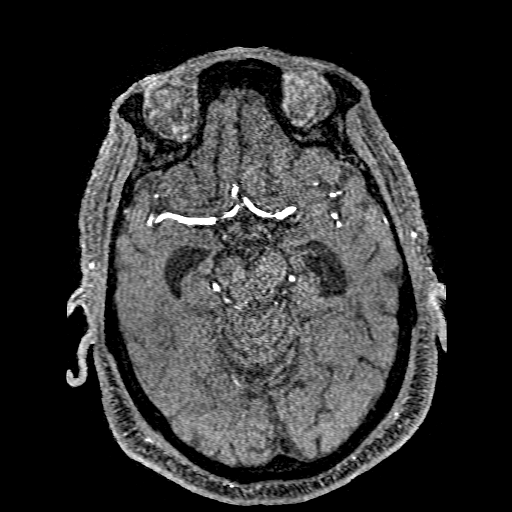
[im 150/184]
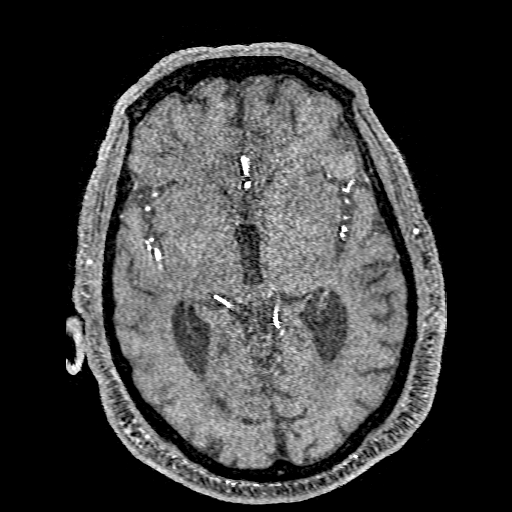
[im 156/184]
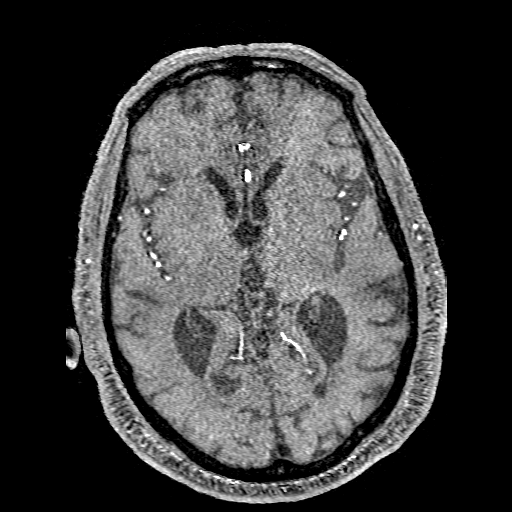
[im 177/184]
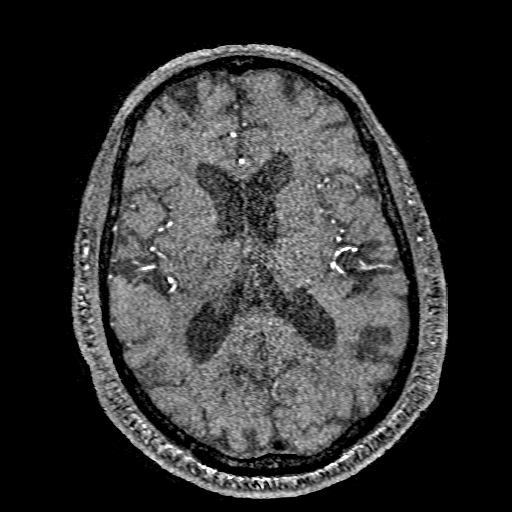

[17 of 48 positions shown; findings below may reference images not displayed]

FINDINGS: MRI HEAD FINDINGS

The study is motion degraded throughout with some sequences being
moderately to severely motion degraded despite repeated imaging
attempts. Axial T1 imaging was not performed.

Brain: There is no evidence of acute infarct, intracranial
hemorrhage, mass, midline shift, or extra-axial fluid collection.
Large right and moderate-sized left cerebellar infarcts are chronic.
Patchy to confluent T2 hyperintensities in the cerebral white matter
have progressed from 6560 and are nonspecific but compatible with
moderate chronic small vessel ischemic disease. Mild chronic small
vessel ischemic changes are present in the pons. Cerebral atrophy
has progressed from 6560, and there is prominent mesial temporal
lobe atrophy.

Vascular: Abnormal distal right vertebral artery, more fully
evaluated below.

Skull and upper cervical spine: No suspicious marrow lesion.

Sinuses/Orbits: Unremarkable orbits. Paranasal sinuses and mastoid
air cells are clear.

Other: None.

MRA HEAD FINDINGS

There is moderate motion artifact at the skull base.

The visualized distal left vertebral artery is patent with moderate
multifocal stenoses which have progressed from 6560. The distal
right vertebral artery is chronically occluded. The basilar artery
is patent without evidence of significant stenosis allowing for
motion artifact proximally. Patent SCA is are seen bilaterally.
There is a moderately large right posterior communicating artery.
Both PCAs are patent with an unchanged moderate right P2 stenosis.

The internal carotid arteries are patent from skull base to carotid
termini with atherosclerotic irregularity but no evidence of
significant stenosis allowing for motion artifact. ACAs and MCAs are
patent without evidence of proximal branch occlusion or significant
proximal stenosis. No aneurysm is identified.

MRA NECK FINDINGS

There is a normal variant aortic arch branching pattern with common
origin of the brachiocephalic and left common carotid arteries. The
brachiocephalic and subclavian arteries are widely patent. The
carotid arteries are patent with atherosclerotic irregularity at the
carotid bulbs bilaterally but no evidence of significant stenosis or
dissection.

The left vertebral artery is patent and dominant with a moderate to
severe stenosis at its origin and with additional moderate stenoses
more distally in the V1 and V2 segments. The non dominant right
vertebral artery is patent through the V1 and V2 segments with
occlusion of the V3 and V4 segments.
IMPRESSION: 1. No acute intracranial abnormality.
2. Moderate chronic small vessel ischemic disease.
3. Large chronic cerebellar infarcts.
4. Motion degraded head MRA without acute large vessel occlusion.
5. Chronic occlusion of the right V3 and V4 segments.
6. Moderate to severe multifocal left vertebral artery stenoses.
7. Widely patent cervical carotid arteries.

## 2020-12-04 ENCOUNTER — Emergency Department (HOSPITAL_COMMUNITY)
Admission: EM | Admit: 2020-12-04 | Discharge: 2020-12-04 | Disposition: A | Discharge status: Home / Self Care | Payer: Medicare PPO | Attending: Emergency Medicine | Admitting: Emergency Medicine

## 2020-12-04 ENCOUNTER — Other Ambulatory Visit: Payer: Self-pay

## 2020-12-04 DIAGNOSIS — Z7982 Long term (current) use of aspirin: Secondary | ICD-10-CM | POA: Insufficient documentation

## 2020-12-04 DIAGNOSIS — F0390 Unspecified dementia without behavioral disturbance: Secondary | ICD-10-CM | POA: Diagnosis not present

## 2020-12-04 DIAGNOSIS — N189 Chronic kidney disease, unspecified: Secondary | ICD-10-CM | POA: Diagnosis not present

## 2020-12-04 DIAGNOSIS — R4182 Altered mental status, unspecified: Secondary | ICD-10-CM | POA: Diagnosis present

## 2020-12-04 DIAGNOSIS — R531 Weakness: Secondary | ICD-10-CM

## 2020-12-04 DIAGNOSIS — Z79899 Other long term (current) drug therapy: Secondary | ICD-10-CM | POA: Diagnosis not present

## 2020-12-04 DIAGNOSIS — Z87891 Personal history of nicotine dependence: Secondary | ICD-10-CM | POA: Insufficient documentation

## 2020-12-04 DIAGNOSIS — I129 Hypertensive chronic kidney disease with stage 1 through stage 4 chronic kidney disease, or unspecified chronic kidney disease: Secondary | ICD-10-CM | POA: Insufficient documentation

## 2020-12-04 DIAGNOSIS — Z8673 Personal history of transient ischemic attack (TIA), and cerebral infarction without residual deficits: Secondary | ICD-10-CM | POA: Insufficient documentation

## 2020-12-04 DIAGNOSIS — E1122 Type 2 diabetes mellitus with diabetic chronic kidney disease: Secondary | ICD-10-CM | POA: Diagnosis not present

## 2020-12-04 DIAGNOSIS — Z20822 Contact with and (suspected) exposure to covid-19: Secondary | ICD-10-CM | POA: Diagnosis not present

## 2020-12-04 LAB — TROPONIN I (HIGH SENSITIVITY)
Troponin I (High Sensitivity): 12 ng/L (ref <18)
Troponin I (High Sensitivity): 10 ng/L (ref <18)

## 2020-12-04 LAB — URINALYSIS, ROUTINE W REFLEX MICROSCOPIC
Bilirubin Urine: NEGATIVE
Glucose, UA: NEGATIVE mg/dL
Hgb urine dipstick: NEGATIVE
Ketones, ur: 5 mg/dL — AB
Leukocytes,Ua: NEGATIVE
Nitrite: NEGATIVE
Protein, ur: NEGATIVE mg/dL
Specific Gravity, Urine: 1.014 (ref 1.005–1.030)
pH: 6 (ref 5.0–8.0)

## 2020-12-04 LAB — COMPREHENSIVE METABOLIC PANEL
ALT: 14 U/L (ref 0–44)
AST: 23 U/L (ref 15–41)
Albumin: 3.8 g/dL (ref 3.5–5.0)
Alkaline Phosphatase: 87 U/L (ref 38–126)
Anion gap: 9 (ref 5–15)
BUN: 14 mg/dL (ref 8–23)
CO2: 24 mmol/L (ref 22–32)
Calcium: 8.7 mg/dL — ABNORMAL LOW (ref 8.9–10.3)
Chloride: 105 mmol/L (ref 98–111)
Creatinine, Ser: 1.34 mg/dL — ABNORMAL HIGH (ref 0.61–1.24)
GFR, Estimated: 51 mL/min — ABNORMAL LOW (ref >60)
Glucose, Bld: 132 mg/dL — ABNORMAL HIGH (ref 70–99)
Potassium: 4.3 mmol/L (ref 3.5–5.1)
Sodium: 138 mmol/L (ref 135–145)
Total Bilirubin: 0.8 mg/dL (ref 0.3–1.2)
Total Protein: 7.4 g/dL (ref 6.5–8.1)

## 2020-12-04 LAB — RESP PANEL BY RT-PCR (FLU A&B, COVID) ARPGX2
Influenza A by PCR: NEGATIVE
Influenza B by PCR: NEGATIVE
SARS Coronavirus 2 by RT PCR: NEGATIVE

## 2020-12-04 LAB — CBC WITH DIFFERENTIAL/PLATELET
Abs Immature Granulocytes: 0.05 10*3/uL (ref 0.00–0.07)
Basophils Absolute: 0.1 10*3/uL (ref 0.0–0.1)
Basophils Relative: 1 %
Eosinophils Absolute: 0 10*3/uL (ref 0.0–0.5)
Eosinophils Relative: 1 %
HCT: 38.1 % — ABNORMAL LOW (ref 39.0–52.0)
Hemoglobin: 12.7 g/dL — ABNORMAL LOW (ref 13.0–17.0)
Immature Granulocytes: 1 %
Lymphocytes Relative: 22 %
Lymphs Abs: 1.2 10*3/uL (ref 0.7–4.0)
MCH: 33 pg (ref 26.0–34.0)
MCHC: 33.3 g/dL (ref 30.0–36.0)
MCV: 99 fL (ref 80.0–100.0)
Monocytes Absolute: 0.5 10*3/uL (ref 0.1–1.0)
Monocytes Relative: 8 %
Neutro Abs: 3.8 10*3/uL (ref 1.7–7.7)
Neutrophils Relative %: 67 %
Platelets: 218 10*3/uL (ref 150–400)
RBC: 3.85 MIL/uL — ABNORMAL LOW (ref 4.22–5.81)
RDW: 14.3 % (ref 11.5–15.5)
WBC: 5.6 10*3/uL (ref 4.0–10.5)
nRBC: 0 % (ref 0.0–0.2)

## 2020-12-04 LAB — PROTIME-INR
INR: 1 (ref 0.8–1.2)
Prothrombin Time: 13.1 seconds (ref 11.4–15.2)

## 2020-12-04 MED ORDER — SODIUM CHLORIDE 0.9 % IV BOLUS
1000.0000 mL | Freq: Once | INTRAVENOUS | Status: AC
  Administered 2020-12-04: 1000 mL via INTRAVENOUS

## 2020-12-04 NOTE — ED Notes (Signed)
Called lab to check on status of final troponin, reports that will be back in about 10-15 minutes

## 2020-12-04 NOTE — ED Triage Notes (Signed)
Pt comes from home via EMS. Pt lives at home with wife. Wife reports pt was lethargic and slow to respond this morning. LKN 2130 on  3/12. Pt is normally talkative and interactive and is back to baseline upon arrival to ED. Stroke scale (-) Pt reports sudden urinary incontinence. Pt a/o x4 at this time. Hx of stroke with no deficits BGL 120 BP 174/90 RR 16 HR 96 O2 100% RA

## 2020-12-04 NOTE — Discharge Instructions (Signed)
We saw you in the ER for WEAKNESS.  All the results in the ER are normal, labs and imaging. We are not sure what is causing your symptoms. The workup in the ER is not complete, and is limited to screening for life threatening and emergent conditions only, so please see a primary care doctor for further evaluation.  Please monitor Shane Juarez closely today.  If he starts having any confusion, slurred speech, one-sided weakness, numbness, dizziness or falls return to the ER immediately.

## 2020-12-04 NOTE — ED Notes (Signed)
Ambulated around room. O2 sats remained between 97-100% the entire time. Pt reports he feels fine.

## 2020-12-04 NOTE — ED Notes (Signed)
[name] arrived, pt safely assisted into car with wife for transport home.

## 2020-12-04 NOTE — ED Notes (Signed)
Pt was up and out of bed, walking around room, had removed all monitoring devices and removed IV. Pt was attempting to get dressed, [name] sitting in the chair watching pt. Redirected back to bed to wait for final blood work to result and discharge papers.

## 2020-12-04 NOTE — ED Provider Notes (Signed)
[name] EMERGENCY DEPARTMENT Provider Note   CSN: [id number] Arrival date & time: 12/04/20  1114     History Chief Complaint  Patient presents with  . Altered Mental Status    Shane Juarez is a 87 y.o. male.  HPI    87 year old male comes in a chief complaint of altered mental status.  He has history of diabetes, dementia, hyperlipidemia, hypertension, stroke.  Patient resides at home with his wife.  The wife was alarmed earlier today and called EMS.  She reports that patient woke up, and was doing well.  Suddenly she noted that he appeared weak, he sat down and slumped forward.  He never lost consciousness.  He just did not have enough energy.  Patient was responsive.  The episode lasted maybe 5 minutes.  She was concerned enough to call 911.  Patient is oriented to self and location.  He is unsure what year it is right now.  He reports that he woke up feeling well and that everything was fine yesterday.  At some point he just felt weaker than usual.  He denies any headaches, neck pain, focal numbness/weakness/tingling.  He denies any chest pain, cough, shortness of breath, nausea, vomiting, diarrhea, UTI-like symptoms.  He does not recall getting dizzy or lightheaded at any point earlier today.  Past Medical History:  Diagnosis Date  . B12 deficiency   . Bilateral inguinal hernia   . CKD (chronic kidney disease)   . Cognitive impairment   . Diabetes mellitus without complication (HCC)   . Hyperlipemia   . Hyperlipidemia   . Hypertension   . PAD (peripheral artery disease) (HCC)   . Stroke [facility])     Patient Active Problem List   Diagnosis Date Noted  . HLD (hyperlipidemia) 08/10/2018  . Acute renal failure superimposed on stage 3 chronic kidney disease (HCC) 08/10/2018  . Acute metabolic encephalopathy 08/10/2018  . TIA (transient ischemic attack) 08/10/2018  . Syncope 08/09/2018  . 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 CVA (cerebral vascular accident) (HCC) 01/06/2012  . HTN (hypertension) 01/06/2012    No past surgical history on file.     Family History  Problem Relation Age of Onset  . Diabetes Sister   . Heart attack Sister     Social History   Tobacco Use  . Smoking status: Former Smoker    Types: Cigarettes    Quit date: 04/02/1967    Years since quitting: 53.7  . Smokeless tobacco: Never Used  Substance Use Topics  . Alcohol use: No    Alcohol/week: 0.0 standard drinks  . Drug use: No    Home Medications Prior to Admission medications   Medication Sig Start Date End Date Taking? Authorizing Provider  amLODipine (NORVASC) 10 MG tablet Take 10 mg by mouth daily. 07/21/18   [provider]  aspirin 81 MG tablet Take 81 mg by mouth daily.    [provider]  atorvastatin (LIPITOR) 20 MG tablet Take 20 mg by mouth every evening. 06/20/18   [provider]  lisinopril (PRINIVIL,ZESTRIL) 40 MG tablet Take 40 mg by mouth daily. 06/20/18   [provider]    Allergies    Patient has no known allergies.  Review of Systems   Review of Systems  Constitutional: Positive for activity change and fatigue. Negative for chills and fever.  Respiratory: Negative for cough and shortness of breath.  Cardiovascular: Negative for chest pain.  Gastrointestinal: Negative for abdominal pain, nausea and vomiting.  Genitourinary: Negative for dysuria.  Allergic/Immunologic: Negative for immunocompromised state.  Hematological: Does not bruise/bleed easily.  All other systems reviewed and are negative.   Physical Exam Updated Vital Signs BP (!) 156/86   Pulse 97   Temp 98.9 F (37.2 C) (Oral)   Resp (!) 24   SpO2 99%   Physical Exam Vitals and nursing note reviewed.  Constitutional:      Appearance: He is well-developed.  HENT:     Head: Atraumatic.  Eyes:      Extraocular Movements: Extraocular movements intact.     Pupils: Pupils are equal, round, and reactive to light.  Cardiovascular:     Rate and Rhythm: Normal rate.  Pulmonary:     Effort: Pulmonary effort is normal.  Musculoskeletal:        General: No swelling or tenderness.     Cervical back: Neck supple.  Skin:    General: Skin is warm.  Neurological:     Mental Status: He is alert.     Cranial Nerves: No cranial nerve deficit.     Sensory: No sensory deficit.     Motor: No weakness.     Coordination: Coordination normal.     Gait: Gait normal.     Comments: Oriented to self and location.  Does not know what year it is.     ED Results / Procedures / Treatments   Labs (all labs ordered are listed, but only abnormal results are displayed) Labs Reviewed  COMPREHENSIVE METABOLIC PANEL - Abnormal; Notable for the following components:      Result Value   Glucose, Bld 132 (*)    Creatinine, Ser 1.34 (*)    Calcium 8.7 (*)    GFR, Estimated 51 (*)    All other components within normal limits  CBC WITH DIFFERENTIAL/PLATELET - Abnormal; Notable for the following components:   RBC 3.85 (*)    Hemoglobin 12.7 (*)    HCT 38.1 (*)    All other components within normal limits  URINALYSIS, ROUTINE W REFLEX MICROSCOPIC - Abnormal; Notable for the following components:   Ketones, ur 5 (*)    All other components within normal limits  RESP PANEL BY RT-PCR (FLU A&B, COVID) ARPGX2  PROTIME-INR  TROPONIN I (HIGH SENSITIVITY)  TROPONIN I (HIGH SENSITIVITY)    EKG EKG Interpretation  Date/Time:  Sunday December 04 2020 11:24:48 EDT Ventricular Rate:  69 PR Interval:    QRS Duration: 97 QT Interval:  419 QTC Calculation: 449 R Axis:   -34 Text Interpretation: Sinus rhythm Left axis deviation Abnormal R-wave progression, early transition No acute changes No significant change since last tracing Confirmed by Derwood Kaplan [phone]) on 12/04/2020 1:02:48 PM   Radiology No results  found.  Procedures Procedures   Medications Ordered in ED Medications  sodium chloride 0.9 % bolus 1,000 mL (0 mLs Intravenous Stopped 12/04/20 1427)    ED Course  I have reviewed the triage vital signs and the nursing notes.  Pertinent labs & imaging results that were available during my care of the patient were reviewed by me and considered in my medical decision making (see chart for details).    MDM Rules/Calculators/A&P                          DDx includes: ICH / Stroke Sepsis syndrome Infection - UTI/Pneumonia Encephalopathy  Electrolyte abnormality  Drug overdose DKA Metabolic disorders including thyroid disorders, adrenal insufficiency Cancer of unknown origin / paraneoplastic process Hypercapnia / COPD Hypoxia  Pt comes in with cc of altered mental status.  Episode lasted only for a few minutes.  Patient is currently back to baseline per son and then the wife.  Patient has no complaints from his side.  He denies any recent illnesses, current fevers, chills, UTI symptoms, abdominal pain, reduced p.o. intake.   Clinically does not appear that there was any focal neuro deficit at any point.  Doubt that this was a TIA versus stroke.  We will carry forward with infection work-up, basic labs and monitor the patient closely in the ER.  Reassessment: Initial work-up is reassuring.  Patient's initial troponin is normal.  UA is not showing any signs of infection.  Electrolytes are within normal limit. COVID-19 test and repeat troponin pending.  Patient remains at baseline normal.  Reassessment: Patient has had ambulatory pulse ox which is normal.  Second troponin, COVID-19 test are negative.  Patient is comfortable going home, wife is also comfortable with the plan.  We will proceed with discharge and strict ER return precautions.  Final Clinical Impression(s) / ED Diagnoses Final diagnoses:  Episode of generalized weakness    Rx / DC Orders ED Discharge Orders     None       Derwood Kaplan, MD 12/04/20 [phone]

## 2020-12-04 NOTE — ED Notes (Signed)
Paper scrub pants obtained for pt in preparation for discharge. [name] instructed to get car and come to ED entrance to pick up pt. Pt placed in wheelchair and taken to lobby to await wife. [name] not at entrance. This nurse stayed with pt for extended period of time until other ED staff instructed to sit with pt until wife found ED entrance. Multiple attempts made to call numbers on pt's contact list unsuccessful. Pt in waiting room with ED staff at this time.

## 2020-12-31 ENCOUNTER — Other Ambulatory Visit: Payer: Self-pay

## 2020-12-31 ENCOUNTER — Emergency Department (HOSPITAL_COMMUNITY): Payer: Medicare PPO

## 2020-12-31 ENCOUNTER — Emergency Department (HOSPITAL_COMMUNITY)
Admission: EM | Admit: 2020-12-31 | Discharge: 2021-01-03 | Disposition: A | Discharge status: Home / Self Care | Payer: Medicare PPO | Attending: Emergency Medicine | Admitting: Emergency Medicine

## 2020-12-31 ENCOUNTER — Encounter (HOSPITAL_COMMUNITY): Payer: Self-pay

## 2020-12-31 DIAGNOSIS — Y92039 Unspecified place in apartment as the place of occurrence of the external cause: Secondary | ICD-10-CM | POA: Diagnosis not present

## 2020-12-31 DIAGNOSIS — Z87891 Personal history of nicotine dependence: Secondary | ICD-10-CM | POA: Insufficient documentation

## 2020-12-31 DIAGNOSIS — Z7982 Long term (current) use of aspirin: Secondary | ICD-10-CM | POA: Insufficient documentation

## 2020-12-31 DIAGNOSIS — F0390 Unspecified dementia without behavioral disturbance: Secondary | ICD-10-CM | POA: Diagnosis not present

## 2020-12-31 DIAGNOSIS — Z79899 Other long term (current) drug therapy: Secondary | ICD-10-CM | POA: Insufficient documentation

## 2020-12-31 DIAGNOSIS — Z20822 Contact with and (suspected) exposure to covid-19: Secondary | ICD-10-CM | POA: Insufficient documentation

## 2020-12-31 DIAGNOSIS — Z046 Encounter for general psychiatric examination, requested by authority: Secondary | ICD-10-CM | POA: Diagnosis present

## 2020-12-31 DIAGNOSIS — I129 Hypertensive chronic kidney disease with stage 1 through stage 4 chronic kidney disease, or unspecified chronic kidney disease: Secondary | ICD-10-CM | POA: Insufficient documentation

## 2020-12-31 DIAGNOSIS — N183 Chronic kidney disease, stage 3 unspecified: Secondary | ICD-10-CM | POA: Diagnosis not present

## 2020-12-31 DIAGNOSIS — E1122 Type 2 diabetes mellitus with diabetic chronic kidney disease: Secondary | ICD-10-CM | POA: Diagnosis not present

## 2020-12-31 DIAGNOSIS — G3184 Mild cognitive impairment, so stated: Secondary | ICD-10-CM | POA: Diagnosis not present

## 2020-12-31 DIAGNOSIS — R9082 White matter disease, unspecified: Secondary | ICD-10-CM | POA: Diagnosis not present

## 2020-12-31 DIAGNOSIS — W19XXXA Unspecified fall, initial encounter: Secondary | ICD-10-CM | POA: Diagnosis not present

## 2020-12-31 DIAGNOSIS — R4189 Other symptoms and signs involving cognitive functions and awareness: Secondary | ICD-10-CM

## 2020-12-31 DIAGNOSIS — I6782 Cerebral ischemia: Secondary | ICD-10-CM | POA: Insufficient documentation

## 2020-12-31 DIAGNOSIS — N3000 Acute cystitis without hematuria: Secondary | ICD-10-CM

## 2020-12-31 LAB — RAPID URINE DRUG SCREEN, HOSP PERFORMED
Amphetamines: NOT DETECTED
Barbiturates: NOT DETECTED
Benzodiazepines: NOT DETECTED
Cocaine: NOT DETECTED
Opiates: NOT DETECTED
Tetrahydrocannabinol: NOT DETECTED

## 2020-12-31 LAB — CBC WITH DIFFERENTIAL/PLATELET
Abs Immature Granulocytes: 0.07 10*3/uL (ref 0.00–0.07)
Basophils Absolute: 0.1 10*3/uL (ref 0.0–0.1)
Basophils Relative: 1 %
Eosinophils Absolute: 0 10*3/uL (ref 0.0–0.5)
Eosinophils Relative: 0 %
HCT: 36.7 % — ABNORMAL LOW (ref 39.0–52.0)
Hemoglobin: 11.7 g/dL — ABNORMAL LOW (ref 13.0–17.0)
Immature Granulocytes: 1 %
Lymphocytes Relative: 23 %
Lymphs Abs: 1.5 10*3/uL (ref 0.7–4.0)
MCH: 32.2 pg (ref 26.0–34.0)
MCHC: 31.9 g/dL (ref 30.0–36.0)
MCV: 101.1 fL — ABNORMAL HIGH (ref 80.0–100.0)
Monocytes Absolute: 0.7 10*3/uL (ref 0.1–1.0)
Monocytes Relative: 11 %
Neutro Abs: 4.3 10*3/uL (ref 1.7–7.7)
Neutrophils Relative %: 64 %
Platelets: 216 10*3/uL (ref 150–400)
RBC: 3.63 MIL/uL — ABNORMAL LOW (ref 4.22–5.81)
RDW: 14.3 % (ref 11.5–15.5)
WBC: 6.7 10*3/uL (ref 4.0–10.5)
nRBC: 0 % (ref 0.0–0.2)

## 2020-12-31 LAB — URINALYSIS, ROUTINE W REFLEX MICROSCOPIC
Bilirubin Urine: NEGATIVE
Glucose, UA: NEGATIVE mg/dL
Ketones, ur: NEGATIVE mg/dL
Nitrite: NEGATIVE
Protein, ur: NEGATIVE mg/dL
RBC / HPF: >50 RBC/hpf — ABNORMAL HIGH (ref 0–5)
Specific Gravity, Urine: 1.012 (ref 1.005–1.030)
pH: 6 (ref 5.0–8.0)

## 2020-12-31 LAB — COMPREHENSIVE METABOLIC PANEL
ALT: 14 U/L (ref 0–44)
AST: 19 U/L (ref 15–41)
Albumin: 4 g/dL (ref 3.5–5.0)
Alkaline Phosphatase: 84 U/L (ref 38–126)
Anion gap: 8 (ref 5–15)
BUN: 19 mg/dL (ref 8–23)
CO2: 24 mmol/L (ref 22–32)
Calcium: 8.6 mg/dL — ABNORMAL LOW (ref 8.9–10.3)
Chloride: 107 mmol/L (ref 98–111)
Creatinine, Ser: 1.33 mg/dL — ABNORMAL HIGH (ref 0.61–1.24)
GFR, Estimated: 51 mL/min — ABNORMAL LOW (ref >60)
Glucose, Bld: 120 mg/dL — ABNORMAL HIGH (ref 70–99)
Potassium: 4.3 mmol/L (ref 3.5–5.1)
Sodium: 139 mmol/L (ref 135–145)
Total Bilirubin: 0.7 mg/dL (ref 0.3–1.2)
Total Protein: 7.5 g/dL (ref 6.5–8.1)

## 2020-12-31 LAB — RESP PANEL BY RT-PCR (FLU A&B, COVID) ARPGX2
Influenza A by PCR: NEGATIVE
Influenza B by PCR: NEGATIVE
SARS Coronavirus 2 by RT PCR: NEGATIVE

## 2020-12-31 LAB — ETHANOL: Alcohol, Ethyl (B): <10 mg/dL (ref <10)

## 2020-12-31 MED ORDER — LORAZEPAM 1 MG PO TABS
1.0000 mg | ORAL_TABLET | Freq: Four times a day (QID) | ORAL | Status: DC | PRN
  Filled 2020-12-31: qty 1

## 2020-12-31 MED ORDER — LISINOPRIL 20 MG PO TABS
40.0000 mg | ORAL_TABLET | Freq: Every day | ORAL | Status: DC
  Administered 2020-12-31 – 2021-01-02 (×2): 40 mg via ORAL
  Filled 2020-12-31 (×2): qty 2

## 2020-12-31 MED ORDER — HALOPERIDOL LACTATE 5 MG/ML IJ SOLN
2.0000 mg | Freq: Four times a day (QID) | INTRAMUSCULAR | Status: DC | PRN
  Administered 2020-12-31: 2 mg via INTRAMUSCULAR
  Filled 2020-12-31 (×2): qty 1

## 2020-12-31 MED ORDER — LORAZEPAM 2 MG/ML IJ SOLN
1.0000 mg | Freq: Once | INTRAMUSCULAR | Status: AC
  Administered 2020-12-31: 1 mg via INTRAMUSCULAR
  Filled 2020-12-31: qty 1

## 2020-12-31 MED ORDER — LORAZEPAM 2 MG/ML IJ SOLN
1.0000 mg | Freq: Four times a day (QID) | INTRAMUSCULAR | Status: DC | PRN
  Administered 2020-12-31 – 2021-01-02 (×2): 1 mg via INTRAMUSCULAR
  Filled 2020-12-31 (×2): qty 1

## 2020-12-31 MED ORDER — ATORVASTATIN CALCIUM 10 MG PO TABS
20.0000 mg | ORAL_TABLET | Freq: Every evening | ORAL | Status: DC
  Administered 2020-12-31 – 2021-01-02 (×3): 20 mg via ORAL
  Filled 2020-12-31 (×3): qty 2

## 2020-12-31 MED ORDER — ASPIRIN EC 81 MG PO TBEC
81.0000 mg | DELAYED_RELEASE_TABLET | Freq: Every day | ORAL | Status: DC
  Administered 2020-12-31 – 2021-01-02 (×2): 81 mg via ORAL
  Filled 2020-12-31 (×3): qty 1

## 2020-12-31 MED ORDER — HALOPERIDOL 1 MG PO TABS
2.0000 mg | ORAL_TABLET | Freq: Four times a day (QID) | ORAL | Status: DC | PRN
  Filled 2020-12-31: qty 2

## 2020-12-31 MED ORDER — CLONIDINE HCL 0.1 MG PO TABS
0.1000 mg | ORAL_TABLET | Freq: Once | ORAL | Status: DC
  Filled 2020-12-31 (×2): qty 1

## 2020-12-31 MED ORDER — HALOPERIDOL LACTATE 5 MG/ML IJ SOLN
2.0000 mg | Freq: Once | INTRAMUSCULAR | Status: AC
  Administered 2020-12-31: 2 mg via INTRAMUSCULAR
  Filled 2020-12-31: qty 1

## 2020-12-31 MED ORDER — ASPIRIN 81 MG PO CHEW
CHEWABLE_TABLET | ORAL | Status: AC
  Filled 2020-12-31: qty 4

## 2020-12-31 MED ORDER — CEPHALEXIN 500 MG PO CAPS
500.0000 mg | ORAL_CAPSULE | Freq: Three times a day (TID) | ORAL | Status: DC
  Administered 2021-01-01 – 2021-01-03 (×4): 500 mg via ORAL
  Filled 2020-12-31 (×4): qty 1
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
## 2020-12-31 NOTE — ED Notes (Signed)
Pt changed into purple scrubs, belongings taken and stored in pt cabinet.
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
## 2020-12-31 NOTE — ED Notes (Signed)
Provider at bedside to evaluate. Provider aware of pt's hypertensive BP. Vs are otherwise stable.

## 2020-12-31 NOTE — BH Assessment (Signed)
TTS spoke to [facility], to complete TTS assesment  Clinician to call the cart in 5 minutes.

## 2020-12-31 NOTE — ED Provider Notes (Signed)
[facility]-EMERGENCY DEPT Provider Note   CSN: [id number] Arrival date & time: 12/31/20  1705     History Chief Complaint  Patient presents with  . Psychiatric Evaluation    Paranoia and threatening wife; non-compliant with medications.     Shane Juarez is a 87 y.o. male.  87 year old male presents under IVC.  Patient himself denies any complaints other than hearing voices in his head.  He is unsure what they are saying.  According to the IVC paperwork, patient threatened to strike his wife and has endorse some paranoia.  Questionable noncompliance with his current medications for high blood pressure.  Does have a history of cognitive impairment.  He denies any recent illnesses.  He is alert and oriented to person situation and time.  Presents via EMS        Past Medical History:  Diagnosis Date  . B12 deficiency   . Bilateral inguinal hernia   . CKD (chronic kidney disease)   . Cognitive impairment   . Diabetes mellitus without complication (HCC)   . Hyperlipemia   . Hyperlipidemia   . Hypertension   . PAD (peripheral artery disease) (HCC)   . Stroke [facility])     Patient Active Problem List   Diagnosis Date Noted  . HLD (hyperlipidemia) 08/10/2018  . Acute renal failure superimposed on stage 3 chronic kidney disease (HCC) 08/10/2018  . Acute metabolic encephalopathy 08/10/2018  . TIA (transient ischemic attack) 08/10/2018  . Syncope 08/09/2018  . 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 CVA (cerebral vascular accident) (HCC) 01/06/2012  . HTN (hypertension) 01/06/2012    History reviewed. No pertinent surgical history.     Family History  Problem Relation Age of Onset  . Diabetes Sister   . Heart attack Sister     Social History   Tobacco Use  . Smoking status: Former Smoker    Types: Cigarettes    Quit date: 04/02/1967    Years  since quitting: 53.7  . Smokeless tobacco: Never Used  Substance Use Topics  . Alcohol use: No    Alcohol/week: 0.0 standard drinks  . Drug use: No    Home Medications Prior to Admission medications   Medication Sig Start Date End Date Taking? Authorizing Provider  amLODipine (NORVASC) 10 MG tablet Take 10 mg by mouth daily. 07/21/18   [provider]  aspirin 81 MG tablet Take 81 mg by mouth daily.    [provider]  atorvastatin (LIPITOR) 20 MG tablet Take 20 mg by mouth every evening. 06/20/18   [provider]  lisinopril (PRINIVIL,ZESTRIL) 40 MG tablet Take 40 mg by mouth daily. 06/20/18   [provider]    Allergies    Patient has no known allergies.  Review of Systems   Review of Systems  Unable to perform ROS: Mental status change    Physical Exam Updated Vital Signs BP (!) 209/111 (BP Location: Right Arm)   Pulse 94   Temp 98.8 F (37.1 C) (Oral)   Ht 1.702 m (5\' 7" )   Wt 80 kg   SpO2 99%   BMI 27.62 kg/m   Physical Exam Vitals and nursing note reviewed.  Constitutional:      General: He is not in acute distress.    Appearance: Normal appearance. He is well-developed. He is not toxic-appearing.  HENT:     Head: Normocephalic and atraumatic.  Eyes:  General: Lids are normal.     Conjunctiva/sclera: Conjunctivae normal.     Pupils: Pupils are equal, round, and reactive to light.  Neck:     Thyroid: No thyroid mass.     Trachea: No tracheal deviation.  Cardiovascular:     Rate and Rhythm: Normal rate and regular rhythm.     Heart sounds: Normal heart sounds. No murmur heard. No gallop.   Pulmonary:     Effort: Pulmonary effort is normal. No respiratory distress.     Breath sounds: Normal breath sounds. No stridor. No decreased breath sounds, wheezing, rhonchi or rales.  Abdominal:     General: Bowel sounds are normal. There is no distension.     Palpations: Abdomen is soft.     Tenderness: There is no abdominal  tenderness. There is no rebound.  Musculoskeletal:        General: No tenderness. Normal range of motion.     Cervical back: Normal range of motion and neck supple.  Skin:    General: Skin is warm and dry.     Findings: No abrasion or rash.  Neurological:     General: No focal deficit present.     Mental Status: He is alert and oriented to person, place, and time.     GCS: GCS eye subscore is 4. GCS verbal subscore is 5. GCS motor subscore is 6.     Cranial Nerves: No cranial nerve deficit.     Sensory: No sensory deficit.  Psychiatric:        Attention and Perception: Attention normal.        Mood and Affect: Affect is flat.        Speech: Speech is delayed.        Behavior: Behavior is slowed and withdrawn.        Thought Content: Thought content does not include homicidal or suicidal plan.     ED Results / Procedures / Treatments   Labs (all labs ordered are listed, but only abnormal results are displayed) Labs Reviewed  RESP PANEL BY RT-PCR (FLU A&B, COVID) ARPGX2  URINE CULTURE  CBC WITH DIFFERENTIAL/PLATELET  COMPREHENSIVE METABOLIC PANEL  ETHANOL  RAPID URINE DRUG SCREEN, HOSP PERFORMED  URINALYSIS, ROUTINE W REFLEX MICROSCOPIC    EKG None  Radiology No results found.  Procedures Procedures   Medications Ordered in ED Medications  lisinopril (ZESTRIL) tablet 40 mg (has no administration in time range)  atorvastatin (LIPITOR) tablet 20 mg (has no administration in time range)  aspirin tablet 81 mg (has no administration in time range)  cloNIDine (CATAPRES) tablet 0.1 mg (has no administration in time range)    ED Course  I have reviewed the triage vital signs and the nursing notes.  Pertinent labs & imaging results that were available during my care of the patient were reviewed by me and considered in my medical decision making (see chart for details).    MDM Rules/Calculators/A&P                          Pt here with dementia Blood pressure  treated medcially cleared for psych referral Final Clinical Impression(s) / ED Diagnoses Final diagnoses:  None    Rx / DC Orders ED Discharge Orders    None       Lorre Nick, MD 01/05/21 1120

## 2021-01-01 ENCOUNTER — Emergency Department (HOSPITAL_COMMUNITY): Payer: Medicare PPO

## 2021-01-01 NOTE — ED Notes (Signed)
Pt sleeping comfortably at this time, VSS, respirations even and unlabored, unrestrained at this time.

## 2021-01-01 NOTE — ED Notes (Signed)
Pt had a fall witnessed by sitter.  Sitter is doing the safety zone.  Pt is now back in bed with a brief and gown on hooked back up to the monitor.  Pt also has a posey belt on and an alarm pad under his body.

## 2021-01-01 NOTE — ED Provider Notes (Signed)
Called to bedside by RN after patient had ?fall. He was sitting on the side of the bed and has a sitter who had turned to get the RN to assist him back to bed. When she looked back he was on the floor. Did not witness fall. No seizure like activity seen.  He has no obvious head, neck or back inury. Full passive ROM of all extremities. Patient assisted to back into bed. Head CT obtained and shows no traumatic findings. Patient remains with sitter at the bedside. ED attending Dr. Shyrl Numbers also evaluated patient, agrees with plan of care.     Shanon Ace, PA-C 01/01/21 1111    Derwood Kaplan, MD 01/03/21 1553

## 2021-01-01 NOTE — Progress Notes (Addendum)
Pt fell around 0850, I, Sem Mccaughey [place], NT am sitting with this pt. While sitting with two pt's at one time, I saw rm #10 get to the end of the bed (was not restrained to the bed but had restraints on, was told that they do not need to be tied up) I went to go get help, as my pt fell within those few seconds. Nurse, Physician and other medical staff came to assist. Will continue to monitor closely.

## 2021-01-02 LAB — URINE CULTURE: Culture: 40000 — AB

## 2021-01-02 NOTE — BH Assessment (Addendum)
Disposition: Per Vernard Gambles, NP, patient continues to meet criteria for [facility] Psych placement. Patient is pending [facility] placement. Faxed out to the following multiple facilities for consideration of bed placement.   [facility] Details  [facility] Details [facility] Details  [facility] Details  [facility] Details  [facility] Details [facility] Details  [facility] Details [facility] Details  [facility] Details  [facility] Details  [facility] Details  [facility] Details  [facility] Details  [facility] Details  [facility] Details  [facility] Details  [facility] Details  [facility] Details  [facility] Details  [facility] Details  [facility] Details  [facility] Details  [facility] Details  [facility] Details  [facility] Details  [facility] Details  [facility] Details  [facility]

## 2021-01-02 NOTE — ED Provider Notes (Signed)
Emergency Medicine Observation Re-evaluation Note  Shane Juarez is a 87 y.o. male, seen on rounds today.  Pt initially presented to the ED for complaints of Psychiatric Evaluation (Paranoia and threatening wife; non-compliant with medications. ) Currently, the patient is cooperative.  Physical Exam  BP (!) 158/82   Pulse 84   Temp 98.9 F (37.2 C) (Oral)   Resp 18   Ht 5\' 7"  (1.702 m)   Wt 80 kg   SpO2 99%   BMI 27.62 kg/m  Physical Exam General: nad Lungs: no respiratory distress Psych: calm, cooperative  ED Course / MDM  EKG:EKG Interpretation  Date/Time:  Saturday December 31 2020 17:43:12 EDT Ventricular Rate:  96 PR Interval:  137 QRS Duration: 91 QT Interval:  369 QTC Calculation: 467 R Axis:   -52 Text Interpretation: Sinus rhythm Multiform ventricular premature complexes Probable left atrial enlargement LAD, consider left anterior fascicular block Abnormal R-wave progression, early transition Confirmed by [id number] (Lorre Nick) on 12/31/2020 6:59:47 PM   I have reviewed the labs performed to date as well as medications administered while in observation.    Plan  Current plan is for geriatric psychiatry placement. Patient is under full IVC at this time.   03/02/2021, MD 01/02/21 [phone]

## 2021-01-02 NOTE — ED Notes (Signed)
The patient's wife is at the bedside and was asking questions in regards to daily medications.  Answered all questions.  Patient is alert and cooperative

## 2021-01-02 NOTE — ED Notes (Signed)
1:1 sitter remains at the bedside for safety.  The patient has a soft waist restraint in place.  Presently the patient is alert and cooperative.

## 2021-01-02 NOTE — Progress Notes (Signed)
This writer attempted to tele consult with patient at 22. RN states that patient was administered 2 mg IM injection of Haldol. RN states that patient was sleeping. Unable to access patient at this time. There are active PRN orders on MAR for Haldol and Ativan.   Continue Geropsych placement that is in progress.

## 2021-01-02 NOTE — ED Notes (Signed)
Pt continues to be restless, agitated, difficult to redirect.

## 2021-01-02 NOTE — ED Notes (Signed)
Report given to [facility] RN on TC unit.  The sitter just cleaned up the patient, attends in placed for incont.  Mittens have been placed due to the patient pulling his gown and covers off.  The waist restraint will remain until to [facility] unit.  Sitter is at the bedside.

## 2021-01-02 NOTE — ED Notes (Signed)
Pt confused. Calm at this time.

## 2021-01-02 NOTE — ED Notes (Signed)
Posey belt on sitter at [facility], patient sleeping

## 2021-01-03 DIAGNOSIS — R4189 Other symptoms and signs involving cognitive functions and awareness: Secondary | ICD-10-CM

## 2021-01-03 MED ORDER — CEPHALEXIN 500 MG PO CAPS: 1000.0000 mg | ORAL_CAPSULE | Freq: Two times a day (BID) | ORAL | 0 refills | Status: DC

## 2021-01-03 NOTE — Progress Notes (Signed)
Attempted to call daughter Cynthia Stainback and Dreyden Rohrman. Left message with NP call back number.

## 2021-01-03 NOTE — Progress Notes (Signed)
.   Transition of Care [facility]) - Emergency Department Mini Assessment   Patient Details  Name: Shane Juarez MRN: [id number] Date of Birth: May 19, 1930  Transition of Care [facility]) CM/SW Contact:    Elliot Cousin, RN Phone Number: [phone] 01/03/2021, 3:56 PM   Clinical Narrative: 200 pm TOC CM spoke to pt's daughter, and she had concerns about UTI. TOC CM sent message to ED provider. States she will have pt's wife provide transportation home.   330 pm Contacted dtr to let her know Rx for UTI was sent to pharmacy.    ED Mini Assessment: What brought you to the Emergency Department? : confusion  Barriers to Discharge: No Barriers Identified  Barrier interventions: called family for transportation home  Means of departure: Car  Interventions which prevented an admission or readmission: Transportation Screening    Patient Contact and Communications Key Contact 1: Norman Clay   Spoke with: daughter Contact Date: 01/03/21,   Contact time: 1555 Contact Phone Number: [phone] Call outcome: will have wife [name] pick him up         Admission diagnosis:  IVC  Patient Active Problem List   Diagnosis Date Noted  . Cognitive impairment 01/03/2021  . HLD (hyperlipidemia) 08/10/2018  . Acute renal failure superimposed on stage 3 chronic kidney disease (HCC) 08/10/2018  . Acute metabolic encephalopathy 08/10/2018  . TIA (transient ischemic attack) 08/10/2018  . Syncope 08/09/2018  . 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 CVA (cerebral vascular accident) (HCC) 01/06/2012  . HTN (hypertension) 01/06/2012   PCP:  [name], MD Pharmacy:   [facility] [address], [place] - [address] [place] [address] Phone: [phone] Fax: [phone]

## 2021-01-03 NOTE — Consult Note (Addendum)
Telepsych Consultation   Reason for Consult:  Telepsych reassessment Referring Physician:  EDP Dr. Donnald Garre Location of Patient: [facility] Location of Provider: [facility]  Patient Identification: Shane Juarez MRN:  [id number] Principal Diagnosis: Cognitive impairment Diagnosis:  Principal Problem:   Cognitive impairment   Total Time spent with patient: 20 minutes   Subjective:   Shane Juarez, 87 y.o., male patient seen via tele health by this provider, consulted with Dr. Lucianne Muss; and chart reviewed on 01/03/21.  On evaluation [name] reports, "I am not sure why I was brought to the hospital" .  HPI:   During evaluation [name] is sitting in bed in no acute distress.  He is alert, oriented to person and place only. He is calm and cooperative.  His mood is pleasant and euthymic with congruent affect. He does not appear to be responding to internal/external stimuli or delusional thoughts.  Patient denies suicidal/self-harm/homicidal ideation, psychosis, and paranoia. Patient is a poor historian and had poor recall on recent events. When discussing events leading up to hospitalization patient had no recall.     Past Psychiatric History: patient denies  Risk to Self:  no  Risk to Others:  no Prior Inpatient Therapy:  denies Prior Outpatient Therapy:  denies  Past Medical History:  Past Medical History:  Diagnosis Date  . B12 deficiency   . Bilateral inguinal hernia   . CKD (chronic kidney disease)   . Cognitive impairment   . Diabetes mellitus without complication (HCC)   . Hyperlipemia   . Hyperlipidemia   . Hypertension   . PAD (peripheral artery disease) (HCC)   . Stroke [facility])    History reviewed. No pertinent surgical history. Family History:  Family History  Problem Relation Age of Onset  . Diabetes Sister   . Heart attack Sister    Family Psychiatric  History: unknown  Social History:  Lives with spouse, denies illegal drug or ETOH use   Social History   Substance and Sexual Activity  Alcohol Use No  . Alcohol/week: 0.0 standard drinks     Social History   Substance and Sexual Activity  Drug Use No    Social History   Socioeconomic History  . Marital status: Married    Spouse name: [name]  . Number of children: 4  . Years of education: HS  . Highest education level: Not on file  Occupational History  . Occupation: Retired    Comment: [employer]  Tobacco Use  . Smoking status: Former Smoker    Types: Cigarettes    Quit date: 04/02/1967    Years since quitting: 53.7  . Smokeless tobacco: Never Used  Substance and Sexual Activity  . Alcohol use: No    Alcohol/week: 0.0 standard drinks  . Drug use: No  . Sexual activity: Not on file  Other Topics Concern  . Not on file  Social History Narrative   Patient lives at home with spouse. [name])   Patient has four children.   Patient is retired.   Patient has a high school education.   Caffeine Use: 1-2 cups daily   Social Determinants of Health   Financial Resource Strain: Not on file  Food Insecurity: Not on file  Transportation Needs: Not on file  Physical Activity: Not on file  Stress: Not on file  Social Connections: Not on file   Additional Social History:    Allergies:   Allergies  Allergen Reactions  . Losartan Potassium  Other reaction(s): made him sick  . Nsaids     Other reaction(s): elevated kidney tests    Labs: No results found for this or any previous visit (from the past 48 hour(s)).  Medications:  Current Facility-Administered Medications  Medication Dose Route Frequency Provider Last Rate Last Admin  . aspirin EC tablet 81 mg  81 mg Oral Daily Lorre Nick, MD   81 mg at 01/02/21 4650  . atorvastatin (LIPITOR) tablet 20 mg  20 mg Oral QPM Lorre Nick, MD   20 mg at 01/02/21 1706  . cephALEXin (KEFLEX) capsule 500 mg  500 mg Oral Q8H Lorre Nick, MD   500 mg at 01/03/21 0528  . cloNIDine (CATAPRES) tablet 0.1 mg  0.1 mg  Oral Once Lorre Nick, MD      . haloperidol lactate (HALDOL) injection 2 mg  2 mg Intramuscular Q6H PRN Lorre Nick, MD   2 mg at 12/31/20 2014  . lisinopril (ZESTRIL) tablet 40 mg  40 mg Oral Daily Lorre Nick, MD   40 mg at 01/02/21 0928  . LORazepam (ATIVAN) injection 1 mg  1 mg Intramuscular Q6H PRN Lorre Nick, MD   1 mg at 01/02/21 1424   Current Outpatient Medications  Medication Sig Dispense Refill  . amLODipine (NORVASC) 10 MG tablet Take 10 mg by mouth daily.  2  . aspirin 81 MG tablet Take 81 mg by mouth daily.    Marland Kitchen atorvastatin (LIPITOR) 20 MG tablet Take 20 mg by mouth every evening.  1  . lisinopril (PRINIVIL,ZESTRIL) 40 MG tablet Take 40 mg by mouth daily.  1    Musculoskeletal: Strength & Muscle Tone: decreased Gait & Station: unsteady Patient leans: N/A  Psychiatric Specialty Exam: Physical Exam Vitals reviewed.  Constitutional:      Appearance: Normal appearance.  HENT:     Head: Normocephalic.     Nose: Nose normal.     Mouth/Throat:     Pharynx: Oropharynx is clear.  Eyes:     Extraocular Movements: Extraocular movements intact.     Conjunctiva/sclera: Conjunctivae normal.  Pulmonary:     Effort: Pulmonary effort is normal. No respiratory distress.  Musculoskeletal:     Cervical back: Normal range of motion.     Comments: Generalized weakness   Skin:    General: Skin is dry.  Neurological:     Mental Status: He is alert. He is disoriented.  Psychiatric:        Attention and Perception: Attention normal.        Mood and Affect: Mood normal. Affect is not angry.        Speech: Speech normal.        Behavior: Behavior is not agitated, aggressive or combative. Behavior is cooperative.        Thought Content: Thought content is not paranoid or delusional. Thought content does not include homicidal or suicidal ideation. Thought content does not include homicidal or suicidal plan.        Cognition and Memory: Cognition is impaired. Memory is  impaired. He exhibits impaired recent memory and impaired remote memory.        Judgment: Judgment is inappropriate (poor insight, poor judgment ).     Review of Systems  Constitutional: Negative for appetite change.  HENT: Negative for drooling and facial swelling.   Eyes: Negative for discharge.  Respiratory: Negative for shortness of breath.   Skin: Negative for pallor.  Neurological: Negative for facial asymmetry and speech difficulty.  Psychiatric/Behavioral: Positive for  confusion. Negative for agitation, hallucinations, self-injury and suicidal ideas. The patient is not nervous/anxious.     Blood pressure 137/74, pulse 86, temperature 98 F (36.7 C), temperature source Oral, resp. rate 16, height 5\' 7"  (1.702 m), weight 80 kg, SpO2 98 %.Body mass index is 27.62 kg/m.  General Appearance: Fairly Groomed  Eye Contact:  Good  Speech:  Clear and Coherent and Normal Rate  Volume:  Normal  Mood:  Euthymic  Affect:  Congruent  Thought Process:  Goal Directed  Orientation:  Other:  oriented to person and place   Thought Content:  Logical  Suicidal Thoughts:  No  Homicidal Thoughts:  No  Memory:  Immediate;   Fair Recent;   Poor Remote;   Poor  Judgement:  Fair  Insight:  Lacking  Psychomotor Activity:  Normal  Concentration:  Concentration: Good and Attention Span: Good  Recall:  Poor  Fund of Knowledge:  Fair  Language:  Good  Akathisia:  did not assess   Handed:  Right  AIMS (if indicated):     Assets:  Financial Resources/Insurance Housing Intimacy Leisure Time Physical Health Resilience Social Support  ADL's:  Impaired  Cognition:  Impaired,  Moderate  Sleep:   denies any changes in appetite or sleep      Treatment Plan Summary: Patient Psych Cleared  TOC consult initiated  Attempted to gain collateral from Maxwel Meadowcroft [phone] and daughter Quartez Lagos [phone]       Disposition: No evidence of imminent risk to self or others at present.    Patient does not meet criteria for psychiatric inpatient admission. Supportive therapy provided about ongoing stressors.  This service was provided via telemedicine using a 2-way, interactive audio and video technology.  Names of all persons participating in this telemedicine service and their role in this encounter. Name: [phone]  Role Patient   Name:Damichael Hofman Riley Churches Role: PMHNP  Name:  Role:   Name:  Role:     Effie Shy, NP 01/03/2021 8:26 AM

## 2021-08-10 ENCOUNTER — Emergency Department (HOSPITAL_COMMUNITY)
Admission: EM | Admit: 2021-08-10 | Discharge: 2021-08-10 | Disposition: A | Discharge status: Home / Self Care | Payer: Medicare PPO | Attending: Emergency Medicine | Admitting: Emergency Medicine

## 2021-08-10 ENCOUNTER — Other Ambulatory Visit: Payer: Self-pay

## 2021-08-10 ENCOUNTER — Emergency Department (HOSPITAL_COMMUNITY): Payer: Medicare PPO

## 2021-08-10 ENCOUNTER — Encounter (HOSPITAL_COMMUNITY): Payer: Self-pay | Admitting: Emergency Medicine

## 2021-08-10 DIAGNOSIS — Z79899 Other long term (current) drug therapy: Secondary | ICD-10-CM | POA: Diagnosis not present

## 2021-08-10 DIAGNOSIS — Z87891 Personal history of nicotine dependence: Secondary | ICD-10-CM | POA: Diagnosis not present

## 2021-08-10 DIAGNOSIS — R350 Frequency of micturition: Secondary | ICD-10-CM | POA: Diagnosis not present

## 2021-08-10 DIAGNOSIS — Z7982 Long term (current) use of aspirin: Secondary | ICD-10-CM | POA: Insufficient documentation

## 2021-08-10 DIAGNOSIS — F0390 Unspecified dementia without behavioral disturbance: Secondary | ICD-10-CM | POA: Diagnosis not present

## 2021-08-10 DIAGNOSIS — E1122 Type 2 diabetes mellitus with diabetic chronic kidney disease: Secondary | ICD-10-CM | POA: Insufficient documentation

## 2021-08-10 DIAGNOSIS — R404 Transient alteration of awareness: Secondary | ICD-10-CM

## 2021-08-10 DIAGNOSIS — N183 Chronic kidney disease, stage 3 unspecified: Secondary | ICD-10-CM | POA: Diagnosis not present

## 2021-08-10 DIAGNOSIS — R4182 Altered mental status, unspecified: Secondary | ICD-10-CM | POA: Diagnosis present

## 2021-08-10 DIAGNOSIS — I129 Hypertensive chronic kidney disease with stage 1 through stage 4 chronic kidney disease, or unspecified chronic kidney disease: Secondary | ICD-10-CM | POA: Diagnosis not present

## 2021-08-10 DIAGNOSIS — E860 Dehydration: Secondary | ICD-10-CM | POA: Insufficient documentation

## 2021-08-10 DIAGNOSIS — R600 Localized edema: Secondary | ICD-10-CM | POA: Insufficient documentation

## 2021-08-10 LAB — CBC WITH DIFFERENTIAL/PLATELET
Abs Immature Granulocytes: 0.03 10*3/uL (ref 0.00–0.07)
Basophils Absolute: 0.1 10*3/uL (ref 0.0–0.1)
Basophils Relative: 1 %
Eosinophils Absolute: 0.1 10*3/uL (ref 0.0–0.5)
Eosinophils Relative: 2 %
HCT: 34.5 % — ABNORMAL LOW (ref 39.0–52.0)
Hemoglobin: 11.3 g/dL — ABNORMAL LOW (ref 13.0–17.0)
Immature Granulocytes: 1 %
Lymphocytes Relative: 31 %
Lymphs Abs: 1.6 10*3/uL (ref 0.7–4.0)
MCH: 32.7 pg (ref 26.0–34.0)
MCHC: 32.8 g/dL (ref 30.0–36.0)
MCV: 99.7 fL (ref 80.0–100.0)
Monocytes Absolute: 0.5 10*3/uL (ref 0.1–1.0)
Monocytes Relative: 10 %
Neutro Abs: 2.9 10*3/uL (ref 1.7–7.7)
Neutrophils Relative %: 55 %
Platelets: 194 10*3/uL (ref 150–400)
RBC: 3.46 MIL/uL — ABNORMAL LOW (ref 4.22–5.81)
RDW: 14.4 % (ref 11.5–15.5)
WBC: 5.2 10*3/uL (ref 4.0–10.5)
nRBC: 0 % (ref 0.0–0.2)

## 2021-08-10 LAB — CBG MONITORING, ED: Glucose-Capillary: 113 mg/dL — ABNORMAL HIGH (ref 70–99)

## 2021-08-10 LAB — COMPREHENSIVE METABOLIC PANEL
ALT: 15 U/L (ref 0–44)
AST: 26 U/L (ref 15–41)
Albumin: 3.3 g/dL — ABNORMAL LOW (ref 3.5–5.0)
Alkaline Phosphatase: 75 U/L (ref 38–126)
Anion gap: 6 (ref 5–15)
BUN: 13 mg/dL (ref 8–23)
CO2: 25 mmol/L (ref 22–32)
Calcium: 8.2 mg/dL — ABNORMAL LOW (ref 8.9–10.3)
Chloride: 105 mmol/L (ref 98–111)
Creatinine, Ser: 1.4 mg/dL — ABNORMAL HIGH (ref 0.61–1.24)
GFR, Estimated: 48 mL/min — ABNORMAL LOW (ref >60)
Glucose, Bld: 141 mg/dL — ABNORMAL HIGH (ref 70–99)
Potassium: 5.2 mmol/L — ABNORMAL HIGH (ref 3.5–5.1)
Sodium: 136 mmol/L (ref 135–145)
Total Bilirubin: 1.2 mg/dL (ref 0.3–1.2)
Total Protein: 6.2 g/dL — ABNORMAL LOW (ref 6.5–8.1)

## 2021-08-10 LAB — URINALYSIS, ROUTINE W REFLEX MICROSCOPIC
Bilirubin Urine: NEGATIVE
Glucose, UA: NEGATIVE mg/dL
Hgb urine dipstick: NEGATIVE
Ketones, ur: NEGATIVE mg/dL
Leukocytes,Ua: NEGATIVE
Nitrite: NEGATIVE
Protein, ur: NEGATIVE mg/dL
Specific Gravity, Urine: 1.014 (ref 1.005–1.030)
pH: 6 (ref 5.0–8.0)

## 2021-08-10 MED ORDER — SODIUM CHLORIDE 0.9 % IV BOLUS
500.0000 mL | Freq: Once | INTRAVENOUS | Status: AC
  Administered 2021-08-10: 11:00:00 500 mL via INTRAVENOUS

## 2021-08-10 NOTE — ED Triage Notes (Signed)
Patient BIB [facility] from home. Per wife woke up this morning confused, urinating frequently. Reports painful urination and foul smell to urine. Wife also reports ground level mechanical fall 2 days ago. VSS.

## 2021-08-10 NOTE — ED Notes (Signed)
Pt transported to CT ?

## 2021-08-10 NOTE — ED Notes (Signed)
Pt ambulated around room with RN with some assistance. Tolerated well. Denies any dizziness.

## 2021-08-10 NOTE — Discharge Instructions (Addendum)
Home to rest. Return to the ER for new or worsening symptoms. Recheck with your doctor.

## 2021-08-10 NOTE — ED Provider Notes (Signed)
[facility] EMERGENCY DEPARTMENT Provider Note   CSN: [id number] Arrival date & time: 08/10/21  [id number]     History Chief Complaint  Patient presents with   Altered Mental Status    Shane Juarez is a 87 y.o. male.  87 year old male with history of cognitive impairment, CKD, CM, HTN, hyperlipidemia, CVA, brought in by EMS from home where he reports he lives with his sister-in-law. Family reported to EMS mechanical fall (details unknown) 2 days ago, not anticoagulated, also with urinary frequency and foul smelling urine.       Past Medical History:  Diagnosis Date   B12 deficiency    Bilateral inguinal hernia    CKD (chronic kidney disease)    Cognitive impairment    Diabetes mellitus without complication ([place])    Hyperlipemia    Hyperlipidemia    Hypertension    PAD (peripheral artery disease) ([place])    Stroke [facility])     Patient Active Problem List   Diagnosis Date Noted   Cognitive impairment 01/03/2021   HLD (hyperlipidemia) 08/10/2018   Acute renal failure superimposed on stage 3 chronic kidney disease ([place]) 123456   Acute metabolic encephalopathy 123456   TIA (transient ischemic attack) 08/10/2018   Syncope 08/09/2018   Alterations of sensations, late effect of cerebrovascular disease(438.6) 04/01/2012   Ataxia, late effect of cerebrovascular disease 02/25/2012   CVA (cerebral vascular accident) ([place]) 01/06/2012   HTN (hypertension) 01/06/2012    History reviewed. No pertinent surgical history.     Family History  Problem Relation Age of Onset   Diabetes Sister    Heart attack Sister     Social History   Tobacco Use   Smoking status: Former    Types: Cigarettes    Quit date: 04/02/1967    Years since quitting: 54.3   Smokeless tobacco: Never  Substance Use Topics   Alcohol use: No    Alcohol/week: 0.0 standard drinks   Drug use: No    Home Medications Prior to Admission medications   Medication Sig Start Date End Date  Taking? Authorizing Provider  amLODipine (NORVASC) 10 MG tablet Take 10 mg by mouth daily. 07/21/18   [provider]  aspirin 81 MG tablet Take 81 mg by mouth daily.    [provider]  atorvastatin (LIPITOR) 20 MG tablet Take 20 mg by mouth every evening. 06/20/18   [provider]  cephALEXin (KEFLEX) 500 MG capsule Take 2 capsules (1,000 mg total) by mouth 2 (two) times daily. 01/03/21   Charlesetta Shanks, MD  lisinopril (PRINIVIL,ZESTRIL) 40 MG tablet Take 40 mg by mouth daily. 06/20/18   [provider]    Allergies    Losartan potassium and Nsaids  Review of Systems   Review of Systems  Unable to perform ROS: Dementia   Physical Exam Updated Vital Signs BP (!) 174/99   Pulse 86   Temp 98 F (36.7 C) (Oral)   Resp 20   SpO2 97%   Physical Exam Vitals and nursing note reviewed.  Constitutional:      General: He is not in acute distress.    Appearance: He is well-developed. He is not diaphoretic.  HENT:     Head: Normocephalic and atraumatic.     Mouth/Throat:     Mouth: Mucous membranes are moist.  Eyes:     Conjunctiva/sclera: Conjunctivae normal.  Cardiovascular:     Rate and Rhythm: Normal rate and regular rhythm.     Pulses: Normal  pulses.     Heart sounds: Normal heart sounds.  Pulmonary:     Effort: Pulmonary effort is normal.     Breath sounds: Normal breath sounds.  Abdominal:     Palpations: Abdomen is soft.     Tenderness: There is no abdominal tenderness.  Musculoskeletal:     Cervical back: Neck supple.     Right lower leg: Edema present.     Left lower leg: Edema present.     Comments: Trace bilateral pitting edema   Skin:    General: Skin is warm and dry.     Findings: No erythema or rash.  Neurological:     General: No focal deficit present.     Mental Status: He is alert.     Sensory: No sensory deficit.     Motor: No weakness.     Comments: Follows commands. Alert to person and place, knows month and day of  birth, not year  Psychiatric:        Behavior: Behavior normal.    ED Results / Procedures / Treatments   Labs (all labs ordered are listed, but only abnormal results are displayed) Labs Reviewed  CBC WITH DIFFERENTIAL/PLATELET - Abnormal; Notable for the following components:      Result Value   RBC 3.46 (*)    Hemoglobin 11.3 (*)    HCT 34.5 (*)    All other components within normal limits  COMPREHENSIVE METABOLIC PANEL - Abnormal; Notable for the following components:   Potassium 5.2 (*)    Glucose, Bld 141 (*)    Creatinine, Ser 1.40 (*)    Calcium 8.2 (*)    Total Protein 6.2 (*)    Albumin 3.3 (*)    GFR, Estimated 48 (*)    All other components within normal limits  CBG MONITORING, ED - Abnormal; Notable for the following components:   Glucose-Capillary 113 (*)    All other components within normal limits  URINALYSIS, ROUTINE W REFLEX MICROSCOPIC    EKG None  Radiology CT Head Wo Contrast  Result Date: 08/10/2021 CLINICAL DATA:  Head trauma, minor.  Fell 2 days ago. EXAM: CT HEAD WITHOUT CONTRAST TECHNIQUE: Contiguous axial images were obtained from the base of the skull through the vertex without intravenous contrast. COMPARISON:  01/01/2021 FINDINGS: Brain: No acute intracranial finding. Generalized brain atrophy. Extensive chronic small-vessel ischemic changes of the cerebral hemispheric white matter. Old cerebellar infarctions, more extensive on the right than the left. Chronic ischemic changes of the pons. No sign of obstructive hydrocephalus, mass, hemorrhage or extra-axial collection. Vascular: There is atherosclerotic calcification of the major vessels at the base of the brain. Skull: Negative Sinuses/Orbits: Clear/normal Other: None IMPRESSION: No acute or reversible finding. Generalized atrophy. Extensive chronic small-vessel ischemic changes of the hemispheric white matter. Extensive old cerebellar infarctions. Cerebral Atrophy (ICD10-G31.9). Electronically  Signed   By: Paulina Fusi M.D.   On: 08/10/2021 11:02   CT Cervical Spine Wo Contrast  Result Date: 08/10/2021 CLINICAL DATA:  Neck trauma. Fell 2 days ago. Altered mental status. EXAM: CT CERVICAL SPINE WITHOUT CONTRAST TECHNIQUE: Multidetector CT imaging of the cervical spine was performed without intravenous contrast. Multiplanar CT image reconstructions were also generated. COMPARISON:  01/06/2012 FINDINGS: Alignment: No traumatic malalignment. Skull base and vertebrae: No regional fracture or focal bone lesion. Soft tissues and spinal canal: No traumatic finding. Disc levels: Ordinary osteoarthritis at the C1-2 articulation. Chronic spondylosis at C3-4, C4-5 and C5-6 with chronic fusion at the C5-6 level. Osteophytic  encroachment upon the foramina at those levels. Lower cervical and upper thoracic region are normal. Facet osteoarthritis from C2-3 through C4-5. Upper chest: Negative Other: None IMPRESSION: No traumatic cervical finding. Ordinary chronic degenerative changes as outlined above. Electronically Signed   By: Nelson Chimes M.D.   On: 08/10/2021 11:04   DG Chest Port 1 View  Result Date: 08/10/2021 CLINICAL DATA:  Altered level of consciousness, painful urination, foul-smelling urine, fall 2 days ago EXAM: PORTABLE CHEST 1 VIEW COMPARISON:  08/22/2018 FINDINGS: The heart size and mediastinal contours are within normal limits. Both lungs are clear. The visualized skeletal structures are unremarkable. IMPRESSION: No acute abnormality of the lungs in AP portable projection. Electronically Signed   By: Delanna Ahmadi M.D.   On: 08/10/2021 10:17    Procedures Procedures   Medications Ordered in ED Medications  sodium chloride 0.9 % bolus 500 mL (0 mLs Intravenous Stopped 08/10/21 1322)    ED Course  I have reviewed the triage vital signs and the nursing notes.  Pertinent labs & imaging results that were available during my care of the patient were reviewed by me and considered in my  medical decision making (see chart for details).  Clinical Course as of 08/10/21 1638  Thu Aug 10, 2021  1106 Patient's wife arrives at bedside, states he was standing today and looked like he was about to pass out so she assisted him to a chair.  Wife Meryl Crutch) does not recall patient falling 2 days ago but states she is 87yo and might have forgotten.  Patient is ambulatory at baseline. Wife feels like patient has made a slight improvement since arriving. Patient and wife live with their children.  [LM]  1448 BP slightly elevated, otherwise patient is afebrile, O2 sat 94%-99% on room air.  Labs reviewed, mildly dehydrated with slight increase in Cr to 1.4, previously 1.33 with gradual uptrend. Given 526mL bolus.  Otherwise, CT head, c-spine negative for acute findings or injury, CXR negative for acute findings. UA does not indicate UTI. CBC without evidence of significant anemia or infection.  [LM]  1637 Patient is ambulatory with  minimal assistance. Wife at bedside, patient is at baseline. Plan is to dc home to recheck with PCP.  [LM]    Clinical Course User Index [LM] Roque Lias   MDM Rules/Calculators/A&P                           Final Clinical Impression(s) / ED Diagnoses Final diagnoses:  Transient alteration of awareness  Dehydration    Rx / DC Orders ED Discharge Orders     None        Tacy Learn, PA-C 08/10/21 1638    Truddie Hidden, MD 08/11/21 [phone]

## 2021-08-26 ENCOUNTER — Emergency Department (HOSPITAL_COMMUNITY): Payer: Medicare PPO

## 2021-08-26 ENCOUNTER — Observation Stay (HOSPITAL_COMMUNITY)
Admission: EM | Admit: 2021-08-26 | Discharge: 2021-08-27 | Disposition: A | Discharge status: Home / Self Care | Payer: Medicare PPO | Attending: Internal Medicine | Admitting: Internal Medicine

## 2021-08-26 ENCOUNTER — Other Ambulatory Visit: Payer: Self-pay

## 2021-08-26 ENCOUNTER — Encounter (HOSPITAL_COMMUNITY): Payer: Self-pay | Admitting: Internal Medicine

## 2021-08-26 DIAGNOSIS — Z20822 Contact with and (suspected) exposure to covid-19: Secondary | ICD-10-CM | POA: Insufficient documentation

## 2021-08-26 DIAGNOSIS — Z515 Encounter for palliative care: Secondary | ICD-10-CM

## 2021-08-26 DIAGNOSIS — R4189 Other symptoms and signs involving cognitive functions and awareness: Secondary | ICD-10-CM | POA: Diagnosis not present

## 2021-08-26 DIAGNOSIS — E1122 Type 2 diabetes mellitus with diabetic chronic kidney disease: Secondary | ICD-10-CM | POA: Insufficient documentation

## 2021-08-26 DIAGNOSIS — Z8673 Personal history of transient ischemic attack (TIA), and cerebral infarction without residual deficits: Secondary | ICD-10-CM | POA: Insufficient documentation

## 2021-08-26 DIAGNOSIS — R2689 Other abnormalities of gait and mobility: Secondary | ICD-10-CM | POA: Insufficient documentation

## 2021-08-26 DIAGNOSIS — Z7982 Long term (current) use of aspirin: Secondary | ICD-10-CM | POA: Insufficient documentation

## 2021-08-26 DIAGNOSIS — Z7189 Other specified counseling: Secondary | ICD-10-CM

## 2021-08-26 DIAGNOSIS — I10 Essential (primary) hypertension: Secondary | ICD-10-CM | POA: Diagnosis present

## 2021-08-26 DIAGNOSIS — R55 Syncope and collapse: Secondary | ICD-10-CM | POA: Diagnosis not present

## 2021-08-26 DIAGNOSIS — E785 Hyperlipidemia, unspecified: Secondary | ICD-10-CM | POA: Diagnosis present

## 2021-08-26 DIAGNOSIS — Z66 Do not resuscitate: Secondary | ICD-10-CM

## 2021-08-26 DIAGNOSIS — R451 Restlessness and agitation: Secondary | ICD-10-CM

## 2021-08-26 DIAGNOSIS — Z79899 Other long term (current) drug therapy: Secondary | ICD-10-CM | POA: Diagnosis not present

## 2021-08-26 DIAGNOSIS — I129 Hypertensive chronic kidney disease with stage 1 through stage 4 chronic kidney disease, or unspecified chronic kidney disease: Secondary | ICD-10-CM | POA: Insufficient documentation

## 2021-08-26 DIAGNOSIS — Z87891 Personal history of nicotine dependence: Secondary | ICD-10-CM | POA: Insufficient documentation

## 2021-08-26 DIAGNOSIS — R531 Weakness: Principal | ICD-10-CM | POA: Insufficient documentation

## 2021-08-26 DIAGNOSIS — R4587 Impulsiveness: Secondary | ICD-10-CM

## 2021-08-26 DIAGNOSIS — N1831 Chronic kidney disease, stage 3a: Secondary | ICD-10-CM | POA: Insufficient documentation

## 2021-08-26 DIAGNOSIS — F0390 Unspecified dementia without behavioral disturbance: Secondary | ICD-10-CM | POA: Insufficient documentation

## 2021-08-26 DIAGNOSIS — Z789 Other specified health status: Secondary | ICD-10-CM

## 2021-08-26 LAB — CBC WITH DIFFERENTIAL/PLATELET
Abs Immature Granulocytes: 0.05 10*3/uL (ref 0.00–0.07)
Basophils Absolute: 0.1 10*3/uL (ref 0.0–0.1)
Basophils Relative: 1 %
Eosinophils Absolute: 0.1 10*3/uL (ref 0.0–0.5)
Eosinophils Relative: 1 %
HCT: 35.1 % — ABNORMAL LOW (ref 39.0–52.0)
Hemoglobin: 11.7 g/dL — ABNORMAL LOW (ref 13.0–17.0)
Immature Granulocytes: 1 %
Lymphocytes Relative: 22 %
Lymphs Abs: 1 10*3/uL (ref 0.7–4.0)
MCH: 33.2 pg (ref 26.0–34.0)
MCHC: 33.3 g/dL (ref 30.0–36.0)
MCV: 99.7 fL (ref 80.0–100.0)
Monocytes Absolute: 0.4 10*3/uL (ref 0.1–1.0)
Monocytes Relative: 9 %
Neutro Abs: 2.9 10*3/uL (ref 1.7–7.7)
Neutrophils Relative %: 66 %
Platelets: 219 10*3/uL (ref 150–400)
RBC: 3.52 MIL/uL — ABNORMAL LOW (ref 4.22–5.81)
RDW: 14.1 % (ref 11.5–15.5)
WBC: 4.4 10*3/uL (ref 4.0–10.5)
nRBC: 0 % (ref 0.0–0.2)

## 2021-08-26 LAB — BASIC METABOLIC PANEL
Anion gap: 6 (ref 5–15)
BUN: 11 mg/dL (ref 8–23)
CO2: 26 mmol/L (ref 22–32)
Calcium: 8.8 mg/dL — ABNORMAL LOW (ref 8.9–10.3)
Chloride: 107 mmol/L (ref 98–111)
Creatinine, Ser: 1.35 mg/dL — ABNORMAL HIGH (ref 0.61–1.24)
GFR, Estimated: 50 mL/min — ABNORMAL LOW (ref >60)
Glucose, Bld: 167 mg/dL — ABNORMAL HIGH (ref 70–99)
Potassium: 4.4 mmol/L (ref 3.5–5.1)
Sodium: 139 mmol/L (ref 135–145)

## 2021-08-26 LAB — RESP PANEL BY RT-PCR (FLU A&B, COVID) ARPGX2
Influenza A by PCR: NEGATIVE
Influenza B by PCR: NEGATIVE
SARS Coronavirus 2 by RT PCR: NEGATIVE

## 2021-08-26 LAB — CBG MONITORING, ED: Glucose-Capillary: 110 mg/dL — ABNORMAL HIGH (ref 70–99)

## 2021-08-26 LAB — TROPONIN I (HIGH SENSITIVITY)
Troponin I (High Sensitivity): 14 ng/L (ref <18)
Troponin I (High Sensitivity): 16 ng/L (ref <18)

## 2021-08-26 MED ORDER — ONDANSETRON HCL 4 MG/2ML IJ SOLN: 4.0000 mg | Freq: Four times a day (QID) | INTRAMUSCULAR | Status: DC | PRN

## 2021-08-26 MED ORDER — POLYETHYLENE GLYCOL 3350 17 G PO PACK: 17.0000 g | PACK | Freq: Every day | ORAL | Status: DC | PRN

## 2021-08-26 MED ORDER — HALOPERIDOL LACTATE 5 MG/ML IJ SOLN
5.0000 mg | Freq: Four times a day (QID) | INTRAMUSCULAR | Status: DC | PRN
  Administered 2021-08-26: 5 mg via INTRAVENOUS
  Filled 2021-08-26 (×2): qty 1

## 2021-08-26 MED ORDER — LACTATED RINGERS IV SOLN: INTRAVENOUS | Status: DC

## 2021-08-26 MED ORDER — SODIUM CHLORIDE 0.9% FLUSH
3.0000 mL | Freq: Two times a day (BID) | INTRAVENOUS | Status: DC
  Administered 2021-08-26 – 2021-08-27 (×2): 3 mL via INTRAVENOUS

## 2021-08-26 MED ORDER — ACETAMINOPHEN 650 MG RE SUPP: 650.0000 mg | Freq: Four times a day (QID) | RECTAL | Status: DC | PRN

## 2021-08-26 MED ORDER — ATORVASTATIN CALCIUM 10 MG PO TABS
20.0000 mg | ORAL_TABLET | Freq: Every evening | ORAL | Status: DC
  Administered 2021-08-26: 20 mg via ORAL
  Filled 2021-08-26: qty 2

## 2021-08-26 MED ORDER — DOCUSATE SODIUM 100 MG PO CAPS
100.0000 mg | ORAL_CAPSULE | Freq: Two times a day (BID) | ORAL | Status: DC
  Administered 2021-08-26 – 2021-08-27 (×2): 100 mg via ORAL
  Filled 2021-08-26 (×2): qty 1

## 2021-08-26 MED ORDER — HALOPERIDOL LACTATE 5 MG/ML IJ SOLN
5.0000 mg | Freq: Once | INTRAMUSCULAR | Status: AC
  Administered 2021-08-26: 5 mg via INTRAMUSCULAR

## 2021-08-26 MED ORDER — ENOXAPARIN SODIUM 40 MG/0.4ML IJ SOSY
40.0000 mg | PREFILLED_SYRINGE | INTRAMUSCULAR | Status: DC
  Administered 2021-08-26: 40 mg via SUBCUTANEOUS
  Filled 2021-08-26: qty 0.4

## 2021-08-26 MED ORDER — BISACODYL 5 MG PO TBEC: 5.0000 mg | DELAYED_RELEASE_TABLET | Freq: Every day | ORAL | Status: DC | PRN

## 2021-08-26 MED ORDER — ASPIRIN 81 MG PO CHEW
81.0000 mg | CHEWABLE_TABLET | Freq: Every day | ORAL | Status: DC
  Administered 2021-08-26 – 2021-08-27 (×2): 81 mg via ORAL
  Filled 2021-08-26 (×2): qty 1

## 2021-08-26 MED ORDER — ACETAMINOPHEN 325 MG PO TABS: 650.0000 mg | ORAL_TABLET | Freq: Four times a day (QID) | ORAL | Status: DC | PRN

## 2021-08-26 MED ORDER — SODIUM CHLORIDE 0.9 % IV SOLN: INTRAVENOUS | Status: DC

## 2021-08-26 MED ORDER — SODIUM CHLORIDE 0.9 % IV BOLUS
500.0000 mL | Freq: Once | INTRAVENOUS | Status: AC
  Administered 2021-08-26: 500 mL via INTRAVENOUS

## 2021-08-26 MED ORDER — ONDANSETRON HCL 4 MG PO TABS: 4.0000 mg | ORAL_TABLET | Freq: Four times a day (QID) | ORAL | Status: DC | PRN

## 2021-08-26 MED ORDER — AMLODIPINE BESYLATE 10 MG PO TABS
10.0000 mg | ORAL_TABLET | Freq: Every day | ORAL | Status: DC
  Administered 2021-08-27: 10:00:00 10 mg via ORAL
  Filled 2021-08-26: qty 1

## 2021-08-26 NOTE — Consult Note (Addendum)
Consultation Note Date: 08/26/2021   Patient Name: Shane Juarez  DOB: June 03, 1930  MRN: [id number]  Age / Sex: 87 y.o., male  PCP: Vernie Shanks, MD Referring Physician: Karmen Bongo, MD  Reason for Consultation: Establishing goals of care  HPI/Patient Profile: 87 y.o. male  with past medical history of CKD stage III, dementia, DM, HTN, HLD, PAD, and CVA presented to ED on 08/26/21 from home after syncopal episode without fall. He was recently seen in ER on 08/10/21 with similar episode, but returned to baseline and was discharged. Patient was admitted for observation on 08/26/2021 with generalized weakness.   Clinical Assessment and Goals of Care: I have reviewed medical records including EPIC notes, labs, and imaging.   Went to visit patient at bedside - no family/visitors present. Patient was lying in bed awake, alert, disoriented, and unable to participate in meaningful conversation. No respiratory distress, increased work of breathing, or secretions noted. Received report from primary RN who was at bedside with NT - acute concern of restlessness, impulsiveness, lack of safety awareness. Haldol had just been given.   Met with daughter/[name] and wife/Ethel via phone  to discuss diagnosis, prognosis, GOC, EOL wishes, disposition, and options.  I introduced Palliative Medicine as specialized medical care for people living with serious illness. It focuses on providing relief from the symptoms and stress of a serious illness. The goal is to improve quality of life for both the patient and the family.  We discussed a brief life review of the patient as well as functional and nutritional status. Patient is married with 4 children. Prior to hospitalization, patient lived in a private residence with his wife. He was able to ambulate independently as well as dress and bathe himself; however, it does put on the same  clothes multiple days in a row as well as forget to bathe. Wife states he has a good appetite at home. Family tell me he has not been officially diagnosed with dementia, but was told he likely has it by his PCP. Family have noticed behavior changes as well as "a different attitude" for several months.   We discussed patient's current illness and what it means in the larger context of patient's on-going co-morbidities. Education provided to family that dementia is a progressive, non-curable disease underlying the patient's current acute medical conditions. Natural disease trajectory and expectations at EOL were discussed. We reviewed specific indicators of end stage dementia, including inability to communicate, bed bound/non-ambulatory status, decreased oral intake, swallowing dysfunction, and incontinence of bowel/bladder. Reviewed that patient does not seem to be at end stages, but reviewed that his dementia is likely progressing with behavior changes and physical/mental decline. I attempted to elicit values and goals of care important to the patient. The difference between aggressive medical intervention and comfort care was considered in light of the patient's goals of care.   We reviewed dispo options to include rehab vs LTC vs home. Family have considered placing patient in LTC but are not ready to proceed with this yet.  We reviewed his lack of safety awareness and impulsiveness - family feel this is his baseline at home; however, may be increasingly anxious/agitated because his environment is different. They feel comfortable with him discharging back home with wife as long as he can ambulate and feed himself. Reviewed PT is to see and offer recommendations. They are open to HHPT but do not feel he would benefit from rehab as this would make him more agitated.   Offered outpatient Palliative Care to follow - family were agreeable.  Advance directives, concepts specific to code status, and  rehospitalization were considered and discussed. Patient does have a Living Will and HCPOA (#1 is wife and #2 is daughter). Family understand that without additional support patient is at high risk of rehospitalization due to lack of safety awareness.   Encouraged family to consider DNR/DNI status understanding evidenced based poor outcomes in similar hospitalized patient, as the cause of arrest is likely associated with advanced chronic/terminal illness rather than an easily reversible acute cardio-pulmonary event. I explained that DNR/DNI does not change the medical plan and it only comes into effect after a person has arrested (died).  It is a protective measure to keep [place] from harming the patient in their last moments of life. Family were agreeable to DNR/DNI with understanding that he would not receive CPR, defibrillation, ACLS medications, or intubation.   Offered chaplain support - declined.   Discussed with patient/family the importance of continued conversation with each other and the medical providers regarding overall plan of care and treatment options, ensuring decisions are within the context of the patient's values and GOCs.    Questions and concerns were addressed. The patient/family was encouraged to call with questions and/or concerns. PMT card was provided.  After call with both daughter and wife, daughter called back to discuss [place] without wife. She confirms what previous notes say, that Meryl Crutch also likely has dementia and does not always have the best judgement. [name] states that Meryl Crutch does not think she has dementia. [name] has been trying to get the patient and wife to move into LTC/ALF together, but wife refuses as she does not recognize her decline. Therapeutic listening provided as [name] reflects on her mental decline. We discussed private duty caregiver support - she would like additional information. Reviewed with [name] that outpatient PC could transition patient to hospice  care when the time comes, as it is anticipated he will continue to decline, without him having to be rehospitalized. [name] also feels as if patient's wife may be forgetting to give him his medications at times/giving too much, which could be contributing to his episodes requiring ED visits/current hospitalization.   Primary Decision Maker: HCPOA - #1 wife/Ethel Amedeo Plenty and #2 daughter/[name] Kahrs  Wife seems to be in mid- stages of dementia - recommend [name] be included in all coordination of patient's care   SUMMARY OF RECOMMENDATIONS   Continue current full scope medical treatment Now DNR/DNI - durable DNR form completed and placed in shadow chart. Copy was made and will be scanned into [facility]yn[facility]/ACP tab Family feel comfortable with patient returning home with wife as long as he can walk and feed himself. They are open to HHPT if recommended but don't feel rehab would be beneficial Long term goal is to get patient admitted into LTC/ALF/memory care [facility] consulted for daughters request for: LTC/memory care facilities - preferably locations patient and wife could go together, private duty caregiver information, and outpatient Palliative Care referral PMT will continue to follow peripherally.  If there are any imminent needs please call the service directly  Code Status/Advance Care Planning: DNR   Palliative Prophylaxis:  Aspiration, Bowel Regimen, Delirium Protocol, Frequent Pain Assessment, Oral Care, and Turn Reposition  Additional Recommendations (Limitations, Scope, Preferences): Full Scope Treatment, No Artificial Feeding, and No Tracheostomy  Psycho-social/Spiritual:  Desire for further Chaplaincy support:no Created space and opportunity for patient and family to express thoughts and feelings regarding patient's current medical situation.  Emotional support and therapeutic listening provided.  Prognosis:  Unable to determine  Discharge Planning: Home with Palliative Services       Primary Diagnoses: Present on Admission:  Cognitive impairment  HLD (hyperlipidemia)  HTN (hypertension)   I have reviewed the medical record, interviewed the patient and family, and examined the patient. The following aspects are pertinent.  Past Medical History:  Diagnosis Date   B12 deficiency    Bilateral inguinal hernia    CKD (chronic kidney disease)    Cognitive impairment    Diabetes mellitus without complication ([facility])    Hyperlipidemia    Hypertension    PAD (peripheral artery disease) ([place])    Stroke ([place])    Social History   Socioeconomic History   Marital status: Married    Spouse name: Ethel   Number of children: 4   Years of education: HS   Highest education level: Not on file  Occupational History   Occupation: Retired    Comment: [employer]  Tobacco Use   Smoking status: Former    Types: Cigarettes    Quit date: 04/02/1967    Years since quitting: 54.4   Smokeless tobacco: Never  Substance and Sexual Activity   Alcohol use: No    Alcohol/week: 0.0 standard drinks   Drug use: No   Sexual activity: Not on file  Other Topics Concern   Not on file  Social History Narrative   Patient lives at home with spouse. Meryl Crutch)   Patient has four children.   Patient is retired.   Patient has a high school education.   Caffeine Use: 1-2 cups daily   Social Determinants of Health   Financial Resource Strain: Not on file  Food Insecurity: Not on file  Transportation Needs: Not on file  Physical Activity: Not on file  Stress: Not on file  Social Connections: Not on file   Family History  Problem Relation Age of Onset   Diabetes Sister    Heart attack Sister    Scheduled Meds:  [START ON 08/27/2021] amLODipine  10 mg Oral Daily   aspirin  81 mg Oral Daily   atorvastatin  20 mg Oral QPM   docusate sodium  100 mg Oral BID   enoxaparin (LOVENOX) injection  40 mg Subcutaneous Q24H   sodium chloride flush  3 mL Intravenous Q12H   Continuous Infusions:   lactated ringers     PRN Meds:.acetaminophen **OR** acetaminophen, bisacodyl, haloperidol lactate, ondansetron **OR** ondansetron (ZOFRAN) IV, polyethylene glycol Medications Prior to Admission:  Prior to Admission medications   Medication Sig Start Date End Date Taking? Authorizing Provider  amLODipine (NORVASC) 10 MG tablet Take 10 mg by mouth daily. 07/21/18   [provider]  aspirin 81 MG tablet Take 81 mg by mouth daily.    [provider]  atorvastatin (LIPITOR) 20 MG tablet Take 20 mg by mouth every evening. 06/20/18   [provider]  lisinopril (PRINIVIL,ZESTRIL) 40 MG tablet Take 40 mg by mouth daily. 06/20/18   [provider]   Allergies  Allergen Reactions   Losartan Potassium     Other reaction(s): made him sick   Nsaids     Other reaction(s): elevated kidney tests   Review of Systems  Unable to perform ROS: Dementia   Physical Exam Vitals and nursing note reviewed.  Constitutional:      General: He is not in acute distress. Pulmonary:     Effort: No respiratory distress.  Skin:    General: Skin is warm and dry.  Neurological:     Mental Status: He is alert. He is disoriented and confused.     Motor: Weakness present.  Psychiatric:        Attention and Perception: He is inattentive.        Mood and Affect: Mood is anxious.        Behavior: Behavior is hyperactive.        Cognition and Memory: Cognition is impaired. Memory is impaired.        Judgment: Judgment is impulsive.    Vital Signs: BP (!) 125/46   Pulse 77   Temp 98.9 F (37.2 C)   Resp 19   SpO2 100%  Pain Scale: 0-10   Pain Score: 0-No pain   SpO2: SpO2: 100 % O2 Device:SpO2: 100 % O2 Flow Rate: .   IO: Intake/output summary:  Intake/Output Summary (Last 24 hours) at 08/26/2021 1649 Last data filed at 08/26/2021 1448 Gross per 24 hour  Intake 666.49 ml  Output --  Net 666.49 ml    LBM:   Baseline Weight:   Most recent weight:       Palliative  Assessment/Data: PPS 50-60%     Time In: 1700 Time Out: 1830 Time Total: 90 minutes  Greater than 50%  of this time was spent counseling and coordinating care related to the above assessment and plan.  Signed by: Lin Landsman, NP   Please contact Palliative Medicine Team phone at [phone] for questions and concerns.  For individual provider: See Shea Evans

## 2021-08-26 NOTE — ED Notes (Signed)
Cleaned pt up due to incontinent. Pt also pulled out his Iv advised nurse

## 2021-08-26 NOTE — H&P (Signed)
History and Physical    Shane Juarez RSW:[id number] DOB: April 25, 1930 DOA: 08/26/2021  PCP: [name], MD Consultants:  None Patient coming from:  Home - lives with wife; NOK: Daughter, Shane Juarez, [phone]  Chief Complaint: Syncope  HPI: Shane Juarez is a 87 y.o. male with medical history significant of CKD; dementia; DM; HTN; HLD; PAD; and CVA presenting with syncope. He was seen in the ER on 11/17 for AMS; he appeared to return to baseline and was discharged.  He returns today with a similar episode.  He is unable to provide history.  He is oriented to person and knows he is in a hospital.  He had a stooling accident and is fixated on that, even though it has already been cleaned up.  He talks about other things, but none of them appear to be pertinent to his presentation.  I spoke with his daughter.  They were going to breakfast and he collapsed into the floor and she couldn't get him up.  He did land in the floor without injury.  No LOC.  He did have a similar episode 2 weeks ago.  He has not had any kind of therapy evaluation.  As a family they feel like he will need placement or some sort of assistance.  Her mother also is starting to develop dementia.  He was wandering in the yard recently.  She is ok with the idea of palliative care.    ED Course: Appears to have had a syncopal episode.  Became weak and passed out - getting up to go to breakfast and slumped.  Evaluation unremarkable.    Review of Systems: As per HPI; otherwise review of systems reviewed and negative.   Ambulatory Status:  Ambulates without assistance  COVID Vaccine Status:  Complete plus boosters  Past Medical History:  Diagnosis Date   B12 deficiency    Bilateral inguinal hernia    CKD (chronic kidney disease)    Cognitive impairment    Diabetes mellitus without complication (HCC)    Hyperlipidemia    Hypertension    PAD (peripheral artery disease) (HCC)    Stroke (HCC)     No past surgical  history on file.  Social History   Socioeconomic History   Marital status: Married    Spouse name: Ethel   Number of children: 4   Years of education: HS   Highest education level: Not on file  Occupational History   Occupation: Retired    Comment: [employer]  Tobacco Use   Smoking status: Former    Types: Cigarettes    Quit date: 04/02/1967    Years since quitting: 54.4   Smokeless tobacco: Never  Substance and Sexual Activity   Alcohol use: No    Alcohol/week: 0.0 standard drinks   Drug use: No   Sexual activity: Not on file  Other Topics Concern   Not on file  Social History Narrative   Patient lives at home with spouse. [name])   Patient has four children.   Patient is retired.   Patient has a high school education.   Caffeine Use: 1-2 cups daily   Social Determinants of Health   Financial Resource Strain: Not on file  Food Insecurity: Not on file  Transportation Needs: Not on file  Physical Activity: Not on file  Stress: Not on file  Social Connections: Not on file  Intimate Partner Violence: Not on file    Allergies  Allergen Reactions   Losartan Potassium  Other reaction(s): made him sick   Nsaids     Other reaction(s): elevated kidney tests    Family History  Problem Relation Age of Onset   Diabetes Sister    Heart attack Sister     Prior to Admission medications   Medication Sig Start Date End Date Taking? Authorizing Provider  amLODipine (NORVASC) 10 MG tablet Take 10 mg by mouth daily. 07/21/18   [provider]  aspirin 81 MG tablet Take 81 mg by mouth daily.    [provider]  atorvastatin (LIPITOR) 20 MG tablet Take 20 mg by mouth every evening. 06/20/18   [provider]  cephALEXin (KEFLEX) 500 MG capsule Take 2 capsules (1,000 mg total) by mouth 2 (two) times daily. 01/03/21   Charlesetta Shanks, MD  lisinopril (PRINIVIL,ZESTRIL) 40 MG tablet Take 40 mg by mouth daily. 06/20/18   [provider]    Physical  Exam: Vitals:   08/26/21 1145 08/26/21 1200 08/26/21 1215 08/26/21 1230  BP: 140/66 (!) 140/55 (!) 124/57 (!) 125/46  Pulse: 67 69 67 77  Resp: 11 18 17 19   Temp:      SpO2: 100% 100% 100% 100%     General:  Appears calm and comfortable and is in NAD, emotional about stooling accident Eyes:  PERRL, EOMI, normal lids, iris ENT:  grossly normal hearing, lips & tongue, mmm Neck:  no LAD, masses or thyromegaly Cardiovascular:  RRR, no m/r/g. 1-2+ LE edema.  Respiratory:   CTA bilaterally with no wheezes/rales/rhonchi.  Normal respiratory effort. Abdomen:  soft, NT, ND Skin:  no rash or induration seen on limited exam Musculoskeletal:  grossly normal tone BUE/BLE, good ROM, no bony abnormality Psychiatric:  blunted mood and affect, speech fluent and somewhat appropriate, AOx1-2 Neurologic:  CN 2-12 grossly intact, moves all extremities in coordinated fashion    Radiological Exams on Admission: Independently reviewed - see discussion in A/P where applicable  CT HEAD WO CONTRAST  Result Date: 08/26/2021 CLINICAL DATA:  Mental status change, unknown cause. EXAM: CT HEAD WITHOUT CONTRAST TECHNIQUE: Contiguous axial images were obtained from the base of the skull through the vertex without intravenous contrast. COMPARISON:  08/10/2021 FINDINGS: Brain: Again noted is encephalomalacia involving the cerebellar hemispheres, right side greater than left. Findings are compatible with old infarcts. Stable cerebral atrophy with stable mild enlargement of the ventricles particularly the temporal horns. Again noted is diffuse low-density in the white matter. Intracranial findings are similar to the previous examination. No evidence for acute hemorrhage, mass lesion, midline shift or large new infarct. Vascular: No hyperdense vessel or unexpected calcification. Skull: Normal. Negative for fracture or focal lesion. Sinuses/Orbits: No acute finding. Other: None. IMPRESSION: 1. No acute intracranial  abnormality. 2. Stable atrophy with evidence of chronic small vessel ischemic changes. 3. Old cerebellar infarcts. Electronically Signed   By: Markus Daft M.D.   On: 08/26/2021 13:29    EKG: Independently reviewed.  NSR with rate 77; LAFB; NSCSLT   Labs on Admission: I have personally reviewed the available labs and imaging studies at the time of the admission.  Pertinent labs:   Glucose 167 BUN 11/Creatinine 1.35/GFR 50 HS troponin 14 WBC 4.4 Hgb 11.7 UA WNL   Assessment/Plan Principal Problem:   Generalized weakness Active Problems:   HTN (hypertension)   HLD (hyperlipidemia)   Cognitive impairment   Stage 3a chronic kidney disease (CKD) (HCC)   Generalized weakness -This is the patient's second recent episode of collapsing -He does not appear to  lose consciousness by description -This may be ambulatory dysfunction or may be associated with his dementia -Will observe overnight on telemetry to ensure no obvious arrhythmia -PT/OT consults -ST consult for cognitive/language evaluation -Palliative care consult  Dementia -Patient has had dementia and lives with his 66yo wife who also appears to be developing dementia -They have refused to move in with their daughter and have not wanted placement, but this may be needed -PT/OT/TOC consults -Palliative care consult  Stage 3a CKD -Appears to be stable -Will recheck in AM -Hold Lisinopril for now  HTN -Diastolic BP is currently low -Will continue Norvasc -Hold lisinopril for now  HLD -Continue Lipitor     Note: This patient has been tested and is pending for the novel coronavirus COVID-19. The patient has been fully vaccinated against COVID-19.   Level of care: Telemetry Medical DVT prophylaxis:  Lovenox Code Status:  Full - confirmed with family (daughter is uncertain and so will confirm with her mother/ACP documents when she can) Family Communication: None present; I spoke with the patient's daughter by  telephone at the time of admission. Disposition Plan:  The patient is from: home  Anticipated d/c is to: be determined  Anticipated d/c date will depend on clinical response to treatment, but possibly as early as tomorrow if he has excellent response to treatment  Patient is currently: acutely ill Consults called: Palliative care; PT/OT/ST; TOC team  Admission status:  It is my clinical opinion that referral for OBSERVATION is reasonable and necessary in this patient based on the above information provided. The aforementioned taken together are felt to place the patient at high risk for further clinical deterioration. However it is anticipated that the patient may be medically stable for discharge from the hospital within 24 to 48 hours.    Karmen Bongo MD [facility]   How to contact the [facility] Attending or Consulting provider [place] or covering provider during after hours [place], for this patient?  Check the care team in [facility] and look for a) attending/consulting [facility] provider listed and b) the [facility] team listed Log into [email or web address] and use [place]'s universal password to access. If you do not have the password, please contact the hospital operator. Locate the [facility] provider you are looking for under [facility] and page to a number that you can be directly reached. If you still have difficulty reaching the provider, please page the [facility] (Director on Call) for the Hospitalists listed on amion for assistance.   08/26/2021, 2:54 PM

## 2021-08-26 NOTE — ED Provider Notes (Signed)
[facility] EMERGENCY DEPARTMENT Provider Note   CSN: [id number] Arrival date & time: 08/26/21  1046     History Chief Complaint  Patient presents with   Loss of Consciousness    Shane Juarez is a 87 y.o. male.   Loss of Consciousness  Patient presented to the ED for evaluation of a possible syncopal episode.  Patient was with family and they were getting ready to go to breakfast.  Apparently the patient just became very weak and slumped over.  Family was able to get him to a chair he did not fall.  When EMS arrived they noted that his blood pressure was low.  They also had a decreased oxygen saturation.  Patient does have history of dementia.  However right now he denies any specific complaints.  He is not having any headache or chest pain.  No abdominal pain.  Past Medical History:  Diagnosis Date   B12 deficiency    Bilateral inguinal hernia    CKD (chronic kidney disease)    Cognitive impairment    Diabetes mellitus without complication (HCC)    Hyperlipemia    Hyperlipidemia    Hypertension    PAD (peripheral artery disease) (HCC)    Stroke [facility])     Patient Active Problem List   Diagnosis Date Noted   Cognitive impairment 01/03/2021   HLD (hyperlipidemia) 08/10/2018   Acute renal failure superimposed on stage 3 chronic kidney disease (HCC) 08/10/2018   Acute metabolic encephalopathy 08/10/2018   TIA (transient ischemic attack) 08/10/2018   Syncope 08/09/2018   Alterations of sensations, 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     Family History  Problem Relation Age of Onset   Diabetes Sister    Heart attack Sister     Social History   Tobacco Use   Smoking status: Former    Types: Cigarettes    Quit date: 04/02/1967    Years since  quitting: 54.4   Smokeless tobacco: Never  Substance Use Topics   Alcohol use: No    Alcohol/week: 0.0 standard drinks   Drug use: No    Home Medications Prior to Admission medications   Medication Sig Start Date End Date Taking? Authorizing Provider  amLODipine (NORVASC) 10 MG tablet Take 10 mg by mouth daily. 07/21/18   [provider]  aspirin 81 MG tablet Take 81 mg by mouth daily.    [provider]  atorvastatin (LIPITOR) 20 MG tablet Take 20 mg by mouth every evening. 06/20/18   [provider]  cephALEXin (KEFLEX) 500 MG capsule Take 2 capsules (1,000 mg total) by mouth 2 (two) times daily. 01/03/21   Arby Barrette, MD  lisinopril (PRINIVIL,ZESTRIL) 40 MG tablet Take 40 mg by mouth daily. 06/20/18   [provider]    Allergies    Losartan potassium and Nsaids  Review of Systems   Review of Systems  Cardiovascular:  Positive for syncope.  All other systems reviewed and are negative.  Physical Exam Updated Vital Signs BP 125/69   Pulse 71   Temp 98.9 F (37.2 C)   Resp 17   SpO2 100%   Physical Exam Vitals and nursing note reviewed.  Constitutional:      Appearance: He is well-developed. He is not ill-appearing.     Comments: Elderly  HENT:     Head: Normocephalic and atraumatic.     Right Ear: External ear normal.     Left Ear: External ear normal.  Eyes:     General: No scleral icterus.       Right eye: No discharge.        Left eye: No discharge.     Conjunctiva/sclera: Conjunctivae normal.  Neck:     Trachea: No tracheal deviation.  Cardiovascular:     Rate and Rhythm: Normal rate and regular rhythm.  Pulmonary:     Effort: Pulmonary effort is normal. No respiratory distress.     Breath sounds: Normal breath sounds. No stridor. No wheezing or rales.  Abdominal:     General: Bowel sounds are normal. There is no distension.     Palpations: Abdomen is soft.     Tenderness: There is no abdominal tenderness. There is  no guarding or rebound.  Musculoskeletal:        General: No tenderness or deformity.     Cervical back: Neck supple.  Skin:    General: Skin is warm and dry.     Findings: No rash.  Neurological:     General: No focal deficit present.     Mental Status: He is alert. He is disoriented.     Cranial Nerves: No cranial nerve deficit (no facial droop, extraocular movements intact, no slurred speech).     Sensory: No sensory deficit.     Motor: No abnormal muscle tone or seizure activity.     Coordination: Coordination normal.     Comments: Follows commands, able to lift both arms off the left bed, able to lift both legs off the bed, no facial droop or slurred speech  Psychiatric:        Mood and Affect: Mood normal.    ED Results / Procedures / Treatments   Labs (all labs ordered are listed, but only abnormal results are displayed) Labs Reviewed  BASIC METABOLIC PANEL - Abnormal; Notable for the following components:      Result Value   Glucose, Bld 167 (*)    Creatinine, Ser 1.35 (*)    Calcium 8.8 (*)    GFR, Estimated 50 (*)    All other components within normal limits  CBC WITH DIFFERENTIAL/PLATELET - Abnormal; Notable for the following components:   RBC 3.52 (*)    Hemoglobin 11.7 (*)    HCT 35.1 (*)    All other components within normal limits  BASIC METABOLIC PANEL - Abnormal; Notable for the following components:   Glucose, Bld 104 (*)    Calcium 8.5 (*)    GFR, Estimated 60 (*)    All other components within normal limits  CBC - Abnormal; Notable for the following components:   RBC 3.28 (*)    Hemoglobin 10.6 (*)    HCT 32.1 (*)    All other components within normal limits  CBG MONITORING, ED - Abnormal; Notable for the following components:   Glucose-Capillary 110 (*)    All other components within normal limits  RESP PANEL BY RT-PCR (FLU A&B, COVID) ARPGX2  TROPONIN I (HIGH SENSITIVITY)  TROPONIN I (HIGH SENSITIVITY)    EKG EKG  Interpretation  Date/Time:  Saturday August 26 2021 10:54:35 EST Ventricular Rate:  77 PR Interval:  142 QRS Duration: 77 QT Interval:  416 QTC Calculation: 471 R Axis:   -62 Text Interpretation: Sinus rhythm Left anterior fascicular block Consider right ventricular hypertrophy No significant change since last  tracing Confirmed by Dorie Rank [phone]) on 08/26/2021 1:43:25 PM  Radiology CT HEAD WO CONTRAST  Result Date: 08/26/2021 CLINICAL DATA:  Mental status change, unknown cause. EXAM: CT HEAD WITHOUT CONTRAST TECHNIQUE: Contiguous axial images were obtained from the base of the skull through the vertex without intravenous contrast. COMPARISON:  08/10/2021 FINDINGS: Brain: Again noted is encephalomalacia involving the cerebellar hemispheres, right side greater than left. Findings are compatible with old infarcts. Stable cerebral atrophy with stable mild enlargement of the ventricles particularly the temporal horns. Again noted is diffuse low-density in the white matter. Intracranial findings are similar to the previous examination. No evidence for acute hemorrhage, mass lesion, midline shift or large new infarct. Vascular: No hyperdense vessel or unexpected calcification. Skull: Normal. Negative for fracture or focal lesion. Sinuses/Orbits: No acute finding. Other: None. IMPRESSION: 1. No acute intracranial abnormality. 2. Stable atrophy with evidence of chronic small vessel ischemic changes. 3. Old cerebellar infarcts. Electronically Signed   By: Markus Daft M.D.   On: 08/26/2021 13:29    Procedures Procedures   Medications Ordered in ED Medications  0.9 %  sodium chloride infusion (has no administration in time range)  sodium chloride 0.9 % bolus 500 mL (has no administration in time range)    ED Course  I have reviewed the triage vital signs and the nursing notes.  Pertinent labs & imaging results that were available during my care of the patient were reviewed by me and considered in  my medical decision making (see chart for details).  Clinical Course as of 08/27/21 0700  Sat Aug 26, 2021  1342 Head CT without acute findings [JK]  1342 CBC shows stable anemia. [JK]  1342 Electrolyte panel without acute findings [JK]    Clinical Course User Index [JK] Dorie Rank, MD   MDM Rules/Calculators/A&P                           Patient presented to the ED for ration of a syncopal episode.  No obvious etiology noted on work-up.  No anemia.  No dehydration.  No acute findings noted on head CT.  It is his age and requirement.  These will consult for admission for cardiac monitoring and observation Final Clinical Impression(s) / ED Diagnoses Final diagnoses:  Syncope and collapse     Dorie Rank, MD 08/27/21 [phone]

## 2021-08-26 NOTE — ED Notes (Signed)
Son Shane Juarez [phone] would like an update

## 2021-08-26 NOTE — ED Notes (Signed)
Pt began getting out of bed and attempting to ambulate outside of room. Pt was able to ambulate to bathroom without need of assistance but maintained with pt d/t fall risk. Pt continuously scooted to the end of the bed and refused to let any VS be taken or to let IV be established/blood drawn. When attempting to redirect pt to bed he would push staff away and resist getting back in bed. With concern for safety and staff having to be in room continuously, Dr. Ophelia Charter was paged and 5mg  haldol IM was ordered and administered. Pt still attempting to get out of bed but has been less resistant to getting back in bed since haldol administered but still refusing VS or interventions.

## 2021-08-26 NOTE — ED Triage Notes (Signed)
Pt here via EMS d/t syncopal episode. Did not fall. Wife able to get a chair under him.  Initial BP with fire 82/40 with fire, SpO2 88% RA. Placed on NRB with increase to 98%   EMS reports  130/70 HR 70 RR CBG 150 98% NRB  20gLFA

## 2021-08-27 DIAGNOSIS — R531 Weakness: Secondary | ICD-10-CM | POA: Diagnosis not present

## 2021-08-27 LAB — BASIC METABOLIC PANEL
Anion gap: 5 (ref 5–15)
BUN: 11 mg/dL (ref 8–23)
CO2: 25 mmol/L (ref 22–32)
Calcium: 8.5 mg/dL — ABNORMAL LOW (ref 8.9–10.3)
Chloride: 108 mmol/L (ref 98–111)
Creatinine, Ser: 1.17 mg/dL (ref 0.61–1.24)
GFR, Estimated: 60 mL/min — ABNORMAL LOW (ref >60)
Glucose, Bld: 104 mg/dL — ABNORMAL HIGH (ref 70–99)
Potassium: 3.8 mmol/L (ref 3.5–5.1)
Sodium: 138 mmol/L (ref 135–145)

## 2021-08-27 LAB — CBC
HCT: 32.1 % — ABNORMAL LOW (ref 39.0–52.0)
Hemoglobin: 10.6 g/dL — ABNORMAL LOW (ref 13.0–17.0)
MCH: 32.3 pg (ref 26.0–34.0)
MCHC: 33 g/dL (ref 30.0–36.0)
MCV: 97.9 fL (ref 80.0–100.0)
Platelets: 202 10*3/uL (ref 150–400)
RBC: 3.28 MIL/uL — ABNORMAL LOW (ref 4.22–5.81)
RDW: 13.9 % (ref 11.5–15.5)
WBC: 6.3 10*3/uL (ref 4.0–10.5)
nRBC: 0 % (ref 0.0–0.2)

## 2021-08-27 NOTE — Evaluation (Signed)
Occupational Therapy Evaluation Patient Details Name: Shane Juarez MRN: [id number] DOB: Jun 21, 1930 Today's Date: 08/27/2021   History of Present Illness 87 y.o. male who presented to ED on 08/26/21 from home after syncopal episode. Pt with past medical history of CKD stage III, dementia, DM, HTN, HLD, PAD, and CVA   Clinical Impression   Shane Juarez was mod I/supervision for ADLs and mobility PTA, his wife completes all IADLs. He lives in a 2 level home with level entry, and is able to stay on the main level with bed/bath. Upon evaluation, pt was oriented to self only and required increased time and cues to follow simple one step direction - pt's son states this is close to his baseline. Overall he required up to min A for functional ambulation, and up to mod A for ADLs. He will benefit from continued OT acutely. Recommend d/c to home with 24/7 close supervision/direct physical assist and HHOT.      Recommendations for follow up therapy are one component of a multi-disciplinary discharge planning process, led by the attending physician.  Recommendations may be updated based on patient status, additional functional criteria and insurance authorization.   Follow Up Recommendations  Home health OT       Functional Status Assessment  Patient has had a recent decline in their functional status and/or demonstrates limited ability to make significant improvements in function in a reasonable and predictable amount of time  Equipment Recommendations  Tub/shower bench (RW)    Recommendations for Other Services       Precautions / Restrictions Precautions Precautions: Fall Restrictions Weight Bearing Restrictions: No      Mobility Bed Mobility Overal bed mobility: Needs Assistance Bed Mobility: Sit to Supine;Supine to Sit     Supine to sit: Min guard Sit to supine: Min assist   General bed mobility comments: + significantly incrased time. assist for verbal cues and bringing BLE back into  bed    Transfers Overall transfer level: Needs assistance Equipment used: Rolling walker (2 wheels);1 person hand held assist Transfers: Sit to/from Stand Sit to Stand: Min assist           General transfer comment: min A for powering into standing, and for short ambulation wtih RW. attempted transfers and ambulation without RW due to seemingly added confusion; recommend abulation with RW      Balance Overall balance assessment: Needs assistance Sitting-balance support: Feet supported Sitting balance-Leahy Scale: Fair     Standing balance support: Bilateral upper extremity supported Standing balance-Leahy Scale: Poor                             ADL either performed or assessed with clinical judgement   ADL Overall ADL's : Needs assistance/impaired Eating/Feeding: Set up;Sitting   Grooming: Wash/dry hands;Wash/dry face;Oral care;Minimal assistance;Standing Grooming Details (indicate cue type and reason): min A for verbal cues to sequence task in new environment Upper Body Bathing: Minimal assistance;Sitting   Lower Body Bathing: Moderate assistance;Sit to/from stand   Upper Body Dressing : Min guard;Sitting   Lower Body Dressing: Moderate assistance;Sit to/from stand   Toilet Transfer: Minimal assistance;Ambulation;Rolling walker (2 wheels)   Toileting- Clothing Manipulation and Hygiene: Minimal assistance;Sit to/from stand       Functional mobility during ADLs: Minimal assistance;Rolling walker (2 wheels) General ADL Comments: assist for verbal cues to initiate and sequence through tasks; mod A for LB dressing for threading BLE and balance in standing. pt limited by  impulsivity, poor safety awareness, and baseline cognitive impairments     Vision Baseline Vision/History: 1 Wears glasses Ability to See in Adequate Light: 0 Adequate Patient Visual Report: No change from baseline Vision Assessment?: No apparent visual deficits Additional Comments:  wears glasses but pt's son states he has ot worn the "in years"            Pertinent Vitals/Pain Pain Assessment: No/denies pain     Hand Dominance Right   Extremity/Trunk Assessment Upper Extremity Assessment Upper Extremity Assessment: Overall WFL for tasks assessed;Difficult to assess due to impaired cognition (limited asssessment due to poor command following, seemingly BUE WFL)   Lower Extremity Assessment Lower Extremity Assessment: Defer to PT evaluation   Cervical / Trunk Assessment Cervical / Trunk Assessment: Kyphotic   Communication Communication Communication: No difficulties   Cognition Arousal/Alertness: Lethargic Behavior During Therapy: Flat affect;Impulsive Overall Cognitive Status: History of cognitive impairments - at baseline           General Comments: baseline dementia, only oriented to self. Pt follow some simple commands with repeat cues; Pt's son states this is similar to his baseline. Impulsive movements throughout session with limited safety awareness     General Comments  VSS on RA, pt's son present and supportive     Home Living Family/patient expects to be discharged to:: Private residence Living Arrangements: Spouse/significant other Available Help at Discharge: Family;Available 24 hours/day Type of Home: House Home Access: Level entry     Home Layout: Two level;Able to live on main level with bedroom/bathroom               Home Equipment: Kasandra Knudsen - single point   Additional Comments: Pt's son present and providing home set up. He states pt's wife is able to provide 24/7 supervision      Prior Functioning/Environment Prior Level of Function : Independent/Modified Independent;History of Falls (last six months)             Mobility Comments: uses cane "sometimes" ADLs Comments: indep; per chart notes pt forgets to complete hygiene tasks, stays in same clothes for multiple days        OT Problem List: Decreased  strength;Decreased activity tolerance;Decreased range of motion;Impaired balance (sitting and/or standing);Decreased cognition;Decreased safety awareness;Decreased knowledge of use of DME or AE;Decreased knowledge of precautions;Pain      OT Treatment/Interventions: Self-care/ADL training;Therapeutic exercise;Balance training;Patient/family education;Modalities;DME and/or AE instruction    OT Goals(Current goals can be found in the care plan section) Acute Rehab OT Goals Patient Stated Goal: did not state OT Goal Formulation: With patient Time For Goal Achievement: 09/10/21 Potential to Achieve Goals: Fair ADL Goals Pt Will Perform Grooming: with modified independence;standing Pt Will Perform Upper Body Dressing: with set-up;sitting Pt Will Perform Lower Body Dressing: with set-up;sit to/from stand Pt Will Transfer to Toilet: with supervision;ambulating;regular height toilet  OT Frequency: Min 2X/week    AM-PAC OT "6 Clicks" Daily Activity     Outcome Measure Help from another person eating meals?: A Little Help from another person taking care of personal grooming?: A Little Help from another person toileting, which includes using toliet, bedpan, or urinal?: A Lot Help from another person bathing (including washing, rinsing, drying)?: A Lot Help from another person to put on and taking off regular upper body clothing?: A Little Help from another person to put on and taking off regular lower body clothing?: A Lot 6 Click Score: 15   End of Session Equipment Utilized During Treatment: Gait belt;Rolling walker (  2 wheels) Nurse Communication: Mobility status  Activity Tolerance: Patient tolerated treatment well Patient left: in bed;with call bell/phone within reach;with bed alarm set;with family/visitor present  OT Visit Diagnosis: Other abnormalities of gait and mobility (R26.89);Repeated falls (R29.6);Muscle weakness (generalized) (M62.81);History of falling (Z91.81);Pain                 Time: [id number] OT Time Calculation (min): 17 min Charges:  OT General Charges $OT Visit: 1 Visit OT Evaluation $OT Eval Moderate Complexity: 1 Mod   Ashanta Amoroso A Claus Silvestro 08/27/2021, 8:32 AM

## 2021-08-27 NOTE — TOC Transition Note (Signed)
Transition of Care [facility]) - CM/SW Discharge Note   Patient Details  Name: Shane Juarez MRN: [id number] Date of Birth: [birth year]  [facility]) CM/SW Contact:  Lawerance Sabal, RN Phone Number: 08/27/2021, 12:48 PM   Clinical Narrative:    [name] w spouse and son at bedside. They are agreeable to [facility] services and familiar with [name] from prior use and would like to use again. Referral placed. Discussed DME, 3/1 and RW will be delivered to room prior to DC, RN aware.  Family provided with private duty resources, and aware that [facility] SW can assist with any LTC needs after DC.  Family to transport home    Final next level of care: Home w Home Health Services Barriers to Discharge: No Barriers Identified   Patient Goals and CMS Choice Patient states their goals for this hospitalization and ongoing recovery are:: to go home CMS Medicare.gov Compare Post Acute Care list provided to:: Other (Comment Required) Choice offered to / list presented to : Spouse  Discharge Placement                       Discharge Plan and Services                DME Arranged: 3-N-1, Walker rolling DME Agency: [employer] Date DME Agency Contacted: 08/27/21 Time DME Agency Contacted: [id number] Representative spoke with at DME Agency: [facility] Arranged: PT, OT, Social Work [employer] Agency: [facility] Date [facility] Agency Contacted: 08/27/21 Time HH Agency Contacted: [id number] Representative spoke with at [facility] Agency: [name]  Social Determinants of Health (SDOH) Interventions     Readmission Risk Interventions No flowsheet data found.

## 2021-08-27 NOTE — Plan of Care (Signed)
?  Problem: Clinical Measurements: ?Goal: Respiratory complications will improve ?Outcome: Progressing ?  ?Problem: Elimination: ?Goal: Will not experience complications related to urinary retention ?Outcome: Progressing ?  ?

## 2021-08-27 NOTE — Plan of Care (Signed)
  Problem: Education: Goal: Knowledge of General Education information will improve Description: Including pain rating scale, medication(s)/side effects and non-pharmacologic comfort measures Outcome: Completed/Met   Problem: Health Behavior/Discharge Planning: Goal: Ability to manage health-related needs will improve Outcome: Completed/Met   Problem: Clinical Measurements: Goal: Ability to maintain clinical measurements within normal limits will improve Outcome: Completed/Met   Problem: Activity: Goal: Risk for activity intolerance will decrease Outcome: Completed/Met

## 2021-08-27 NOTE — Discharge Summary (Signed)
Physician Discharge Summary  Shane Juarez [id number] DOB: 1930-02-13 DOA: 08/26/2021  PCP: Vernie Shanks, MD  Admit date: 08/26/2021 Discharge date: 08/27/2021  Admitted From: Home Disposition: Home with home health PT/OT/social worker  Recommendations for Outpatient Follow-up:  Follow up with PCP in 1-2 weeks   Home Health: PT/OT/social worker Equipment/Devices: 3 and 1 and rolling walker  Discharge Condition: Fair CODE STATUS: DNR Diet recommendation: Regular diet.  Encourage nutrition and fluid.  Discharge summary: 87 year old gentleman with history of CKD stage II, dementia with recently worsening condition, hypertension and hyperlipidemia presented with syncope.  Apparently he was going to breakfast table and collapsed onto the floor and family could not get him up.  With recurrent episodes he was brought to the ER.  Generalized weakness/progressive debility/progressive dementia symptoms/moderate dehydration with history of hypertension: -Patient apparently with progressive symptoms of dementia and needing more assist at home.  He was treated with gentle IV fluids with improvement.  He was seen by PT OT who recommended long-term care.  Detailed discussion with social worker and palliative care with the family. -Currently family is comfortable to take care of him at home.  He has multiple support system but will continue to need more support. -Medication management, he will continue aspirin, Lipitor and amlodipine with history of CVA.  Will discontinue lisinopril that will unnecessarily cause him dehydration and orthostatic. -Patient will benefit with home health PT OT.  Safety evaluation.  Also referred to home health social worker who can help with safety evaluation, provide more resources and long-term care needs if needed.  Patient medically stable.  Discussed in detail with patient's wife at the bedside and son at bedside.  They are comfortable taking him home.   Discharge  Diagnoses:  Principal Problem:   Generalized weakness Active Problems:   HTN (hypertension)   HLD (hyperlipidemia)   Cognitive impairment   Stage 3a chronic kidney disease (CKD) (HCC)    Discharge Instructions  Discharge Instructions     Diet - low sodium heart healthy   Complete by: As directed    Increase activity slowly   Complete by: As directed       Allergies as of 08/27/2021       Reactions   Losartan Potassium    Other reaction(s): made him sick   Nsaids    Other reaction(s): elevated kidney tests        Medication List     STOP taking these medications    lisinopril 40 MG tablet Commonly known as: ZESTRIL       TAKE these medications    amLODipine 10 MG tablet Commonly known as: NORVASC Take 10 mg by mouth daily.   aspirin 81 MG tablet Take 81 mg by mouth daily.   atorvastatin 20 MG tablet Commonly known as: LIPITOR Take 20 mg by mouth every evening.        Follow-up Information     Vernie Shanks, MD Follow up in 1 week(s).   Specialty: Family Medicine Contact information: [place] [address] [phone]                Allergies  Allergen Reactions   Losartan Potassium     Other reaction(s): made him sick   Nsaids     Other reaction(s): elevated kidney tests    Consultations: Palliative care   Procedures/Studies: CT HEAD WO CONTRAST  Result Date: 08/26/2021 CLINICAL DATA:  Mental status change, unknown cause. EXAM: CT HEAD WITHOUT CONTRAST  TECHNIQUE: Contiguous axial images were obtained from the base of the skull through the vertex without intravenous contrast. COMPARISON:  08/10/2021 FINDINGS: Brain: Again noted is encephalomalacia involving the cerebellar hemispheres, right side greater than left. Findings are compatible with old infarcts. Stable cerebral atrophy with stable mild enlargement of the ventricles particularly the temporal horns. Again noted is diffuse low-density in the white  matter. Intracranial findings are similar to the previous examination. No evidence for acute hemorrhage, mass lesion, midline shift or large new infarct. Vascular: No hyperdense vessel or unexpected calcification. Skull: Normal. Negative for fracture or focal lesion. Sinuses/Orbits: No acute finding. Other: None. IMPRESSION: 1. No acute intracranial abnormality. 2. Stable atrophy with evidence of chronic small vessel ischemic changes. 3. Old cerebellar infarcts. Electronically Signed   By: Richarda Overlie M.D.   On: 08/26/2021 13:29   CT Head Wo Contrast  Result Date: 08/10/2021 CLINICAL DATA:  Head trauma, minor.  Fell 2 days ago. EXAM: CT HEAD WITHOUT CONTRAST TECHNIQUE: Contiguous axial images were obtained from the base of the skull through the vertex without intravenous contrast. COMPARISON:  01/01/2021 FINDINGS: Brain: No acute intracranial finding. Generalized brain atrophy. Extensive chronic small-vessel ischemic changes of the cerebral hemispheric white matter. Old cerebellar infarctions, more extensive on the right than the left. Chronic ischemic changes of the pons. No sign of obstructive hydrocephalus, mass, hemorrhage or extra-axial collection. Vascular: There is atherosclerotic calcification of the major vessels at the base of the brain. Skull: Negative Sinuses/Orbits: Clear/normal Other: None IMPRESSION: No acute or reversible finding. Generalized atrophy. Extensive chronic small-vessel ischemic changes of the hemispheric white matter. Extensive old cerebellar infarctions. Cerebral Atrophy (ICD10-G31.9). Electronically Signed   By: Paulina Fusi M.D.   On: 08/10/2021 11:02   CT Cervical Spine Wo Contrast  Result Date: 08/10/2021 CLINICAL DATA:  Neck trauma. Fell 2 days ago. Altered mental status. EXAM: CT CERVICAL SPINE WITHOUT CONTRAST TECHNIQUE: Multidetector CT imaging of the cervical spine was performed without intravenous contrast. Multiplanar CT image reconstructions were also generated.  COMPARISON:  01/06/2012 FINDINGS: Alignment: No traumatic malalignment. Skull base and vertebrae: No regional fracture or focal bone lesion. Soft tissues and spinal canal: No traumatic finding. Disc levels: Ordinary osteoarthritis at the C1-2 articulation. Chronic spondylosis at C3-4, C4-5 and C5-6 with chronic fusion at the C5-6 level. Osteophytic encroachment upon the foramina at those levels. Lower cervical and upper thoracic region are normal. Facet osteoarthritis from C2-3 through C4-5. Upper chest: Negative Other: None IMPRESSION: No traumatic cervical finding. Ordinary chronic degenerative changes as outlined above. Electronically Signed   By: Paulina Fusi M.D.   On: 08/10/2021 11:04   DG Chest Port 1 View  Result Date: 08/10/2021 CLINICAL DATA:  Altered level of consciousness, painful urination, foul-smelling urine, fall 2 days ago EXAM: PORTABLE CHEST 1 VIEW COMPARISON:  08/22/2018 FINDINGS: The heart size and mediastinal contours are within normal limits. Both lungs are clear. The visualized skeletal structures are unremarkable. IMPRESSION: No acute abnormality of the lungs in AP portable projection. Electronically Signed   By: Jearld Lesch M.D.   On: 08/10/2021 10:17   (Echo, Carotid, EGD, Colonoscopy, ERCP)    Subjective: Patient seen and examined.  Pleasantly confused.  He was eating his lunch and was able to completely eat all his food from the plate.  I told him that he is going home today.  He tells me that that is his wife and the bedside and" we are going home".  Patient denies any complaints.  Family noticed  that he is back to his baseline.   Discharge Exam: Vitals:   08/27/21 0300 08/27/21 0700  BP: 120/63 (!) 155/72  Pulse: 75 88  Resp: 14 18  Temp: 97.8 F (36.6 C) 98.1 F (36.7 C)  SpO2: 96% 97%   Vitals:   08/26/21 2137 08/26/21 2327 08/27/21 0300 08/27/21 0700  BP: (!) 146/65 (!) 149/84 120/63 (!) 155/72  Pulse: 75 95 75 88  Resp: 14 17 14 18   Temp: 98 F (36.7  C) 98 F (36.7 C) 97.8 F (36.6 C) 98.1 F (36.7 C)  TempSrc: Axillary Oral Oral Oral  SpO2: 96% 96% 96% 97%    General: Pt is alert, awake, not in acute distress Pleasant and confused.  Alert oriented x1-2.  Not in any distress. Cardiovascular: RRR, S1/S2 +, no rubs, no gallops Respiratory: CTA bilaterally, no wheezing, no rhonchi Abdominal: Soft, NT, ND, bowel sounds + Extremities: no edema, no cyanosis    The results of significant diagnostics from this hospitalization (including imaging, microbiology, ancillary and laboratory) are listed below for reference.     Microbiology: Recent Results (from the past 240 hour(s))  Resp Panel by RT-PCR (Flu A&B, Covid) Nasopharyngeal Swab     Status: None   Collection Time: 08/26/21  8:35 PM   Specimen: Nasopharyngeal Swab; Nasopharyngeal(NP) swabs in vial transport medium  Result Value Ref Range Status   SARS Coronavirus 2 by RT PCR NEGATIVE NEGATIVE Final    Comment: (NOTE) SARS-CoV-2 target nucleic acids are NOT DETECTED.  The SARS-CoV-2 RNA is generally detectable in upper respiratory specimens during the acute phase of infection. The lowest concentration of SARS-CoV-2 viral copies this assay can detect is 138 copies/mL. A negative result does not preclude SARS-Cov-2 infection and should not be used as the sole basis for treatment or other patient management decisions. A negative result may occur with  improper specimen collection/handling, submission of specimen other than nasopharyngeal swab, presence of viral mutation(s) within the areas targeted by this assay, and inadequate number of viral copies(<138 copies/mL). A negative result must be combined with clinical observations, patient history, and epidemiological information. The expected result is Negative.  Fact Sheet for Patients:  EntrepreneurPulse.com.au  Fact Sheet for Healthcare Providers:  IncredibleEmployment.be  This test is  no t yet approved or cleared by the [place] FDA and  has been authorized for detection and/or diagnosis of SARS-CoV-2 by FDA under an Emergency Use Authorization (EUA). This EUA will remain  in effect (meaning this test can be used) for the duration of the COVID-19 declaration under Section 564(b)(1) of the Act, 21 U.S.C.section 360bbb-3(b)(1), unless the authorization is terminated  or revoked sooner.       Influenza A by PCR NEGATIVE NEGATIVE Final   Influenza B by PCR NEGATIVE NEGATIVE Final    Comment: (NOTE) The Xpert Xpress SARS-CoV-2/FLU/RSV plus assay is intended as an aid in the diagnosis of influenza from Nasopharyngeal swab specimens and should not be used as a sole basis for treatment. Nasal washings and aspirates are unacceptable for Xpert Xpress SARS-CoV-2/FLU/RSV testing.  Fact Sheet for Patients: EntrepreneurPulse.com.au  Fact Sheet for Healthcare Providers: IncredibleEmployment.be  This test is not yet approved or cleared by the [place] FDA and has been authorized for detection and/or diagnosis of SARS-CoV-2 by FDA under an Emergency Use Authorization (EUA). This EUA will remain in effect (meaning this test can be used) for the duration of the COVID-19 declaration under Section 564(b)(1) of the Act, 21 U.S.C. section 360bbb-3(b)(1), unless  the authorization is terminated or revoked.  Performed at [facility], [place] [address] [place], [place] [address]      Labs: BNP (last 3 results) No results for input(s): BNP in the last 8760 hours. Basic Metabolic Panel: Recent Labs  Lab 08/26/21 1107 08/27/21 0202  NA 139 138  K 4.4 3.8  CL 107 108  CO2 26 25  GLUCOSE 167* 104*  BUN 11 11  CREATININE 1.35* 1.17  CALCIUM 8.8* 8.5*   Liver Function Tests: No results for input(s): AST, ALT, ALKPHOS, BILITOT, PROT, ALBUMIN in the last 168 hours. No results for input(s): LIPASE, AMYLASE in the last 168  hours. No results for input(s): AMMONIA in the last 168 hours. CBC: Recent Labs  Lab 08/26/21 1107 08/27/21 0202  WBC 4.4 6.3  NEUTROABS 2.9  --   HGB 11.7* 10.6*  HCT 35.1* 32.1*  MCV 99.7 97.9  PLT 219 202   Cardiac Enzymes: No results for input(s): CKTOTAL, CKMB, CKMBINDEX, TROPONINI in the last 168 hours. BNP: Invalid input(s): POCBNP CBG: Recent Labs  Lab 08/26/21 1215  GLUCAP 110*   D-Dimer No results for input(s): DDIMER in the last 72 hours. Hgb A1c No results for input(s): HGBA1C in the last 72 hours. Lipid Profile No results for input(s): CHOL, HDL, LDLCALC, TRIG, CHOLHDL, LDLDIRECT in the last 72 hours. Thyroid function studies No results for input(s): TSH, T4TOTAL, T3FREE, THYROIDAB in the last 72 hours.  Invalid input(s): FREET3 Anemia work up No results for input(s): VITAMINB12, FOLATE, FERRITIN, TIBC, IRON, RETICCTPCT in the last 72 hours. Urinalysis    Component Value Date/Time   COLORURINE YELLOW 08/10/2021 [place] 08/10/2021 1321   LABSPEC 1.014 08/10/2021 1321   PHURINE 6.0 08/10/2021 1321   GLUCOSEU NEGATIVE 08/10/2021 1321   HGBUR NEGATIVE 08/10/2021 1321   [place] 08/10/2021 1321   KETONESUR NEGATIVE 08/10/2021 1321   PROTEINUR NEGATIVE 08/10/2021 1321   UROBILINOGEN 0.2 01/06/2012 1335   NITRITE NEGATIVE 08/10/2021 1321   LEUKOCYTESUR NEGATIVE 08/10/2021 1321   Sepsis Labs Invalid input(s): PROCALCITONIN,  WBC,  LACTICIDVEN Microbiology Recent Results (from the past 240 hour(s))  Resp Panel by RT-PCR (Flu A&B, Covid) Nasopharyngeal Swab     Status: None   Collection Time: 08/26/21  8:35 PM   Specimen: Nasopharyngeal Swab; Nasopharyngeal(NP) swabs in vial transport medium  Result Value Ref Range Status   SARS Coronavirus 2 by RT PCR NEGATIVE NEGATIVE Final    Comment: (NOTE) SARS-CoV-2 target nucleic acids are NOT DETECTED.  The SARS-CoV-2 RNA is generally detectable in upper respiratory specimens  during the acute phase of infection. The lowest concentration of SARS-CoV-2 viral copies this assay can detect is 138 copies/mL. A negative result does not preclude SARS-Cov-2 infection and should not be used as the sole basis for treatment or other patient management decisions. A negative result may occur with  improper specimen collection/handling, submission of specimen other than nasopharyngeal swab, presence of viral mutation(s) within the areas targeted by this assay, and inadequate number of viral copies(<138 copies/mL). A negative result must be combined with clinical observations, patient history, and epidemiological information. The expected result is Negative.  Fact Sheet for Patients:  EntrepreneurPulse.com.au  Fact Sheet for Healthcare Providers:  IncredibleEmployment.be  This test is no t yet approved or cleared by the [place] FDA and  has been authorized for detection and/or diagnosis of SARS-CoV-2 by FDA under an Emergency Use Authorization (EUA). This EUA will remain  in effect (meaning  this test can be used) for the duration of the COVID-19 declaration under Section 564(b)(1) of the Act, 21 U.S.C.section 360bbb-3(b)(1), unless the authorization is terminated  or revoked sooner.       Influenza A by PCR NEGATIVE NEGATIVE Final   Influenza B by PCR NEGATIVE NEGATIVE Final    Comment: (NOTE) The Xpert Xpress SARS-CoV-2/FLU/RSV plus assay is intended as an aid in the diagnosis of influenza from Nasopharyngeal swab specimens and should not be used as a sole basis for treatment. Nasal washings and aspirates are unacceptable for Xpert Xpress SARS-CoV-2/FLU/RSV testing.  Fact Sheet for Patients: EntrepreneurPulse.com.au  Fact Sheet for Healthcare Providers: IncredibleEmployment.be  This test is not yet approved or cleared by the [place] FDA and has been authorized for detection  and/or diagnosis of SARS-CoV-2 by FDA under an Emergency Use Authorization (EUA). This EUA will remain in effect (meaning this test can be used) for the duration of the COVID-19 declaration under Section 564(b)(1) of the Act, 21 U.S.C. section 360bbb-3(b)(1), unless the authorization is terminated or revoked.  Performed at [facility], [place] [address] [place], [place] [address]      Time coordinating discharge:  30 minutes  SIGNED:   Barb Merino, MD  [facility] 08/27/2021, 11:07 AM

## 2021-08-27 NOTE — Evaluation (Signed)
Physical Therapy Evaluation Patient Details Name: Shane Juarez MRN: [id number] DOB: April 18, 1930 Today's Date: 08/27/2021  History of Present Illness  87 y.o. male who presented to ED on 08/26/21 from home after syncopal episode. Pt with past medical history of CKD stage III, dementia, DM, HTN, HLD, PAD, and CVA  Clinical Impression  PTA, pt lives with his spouse, is independent with mobility and is supervision for ADL's. Pt with decreased cognition (baseline), balance deficits, and weakness. Ambulating 100 feet with a walker at a min guard assist level. Pt denies dizziness/lightheadedness. Recommend RW for mobility at this time as pt seeks external support when he does not have an assistive device. Pt family plans to provide 24/7 assist upon discharge home. Recommend HHPT to address deficits and maximize functional mobility.    Recommendations for follow up therapy are one component of a multi-disciplinary discharge planning process, led by the attending physician.  Recommendations may be updated based on patient status, additional functional criteria and insurance authorization.  Follow Up Recommendations Home health PT    Assistance Recommended at Discharge Frequent or constant Supervision/Assistance  Functional Status Assessment Patient has had a recent decline in their functional status and demonstrates the ability to make significant improvements in function in a reasonable and predictable amount of time.   Equipment Recommendations  Rolling walker (2 wheels)    Recommendations for Other Services       Precautions / Restrictions Precautions Precautions: Fall Restrictions Weight Bearing Restrictions: No      Mobility  Bed Mobility Overal bed mobility: Needs Assistance Bed Mobility: Sit to Supine;Supine to Sit     Supine to sit: Min guard Sit to supine: Min guard   General bed mobility comments: No physical assist, min guard for safety    Transfers Overall transfer level:  Needs assistance Equipment used: Rolling walker (2 wheels);1 person hand held assist Transfers: Sit to/from Stand Sit to Stand: Min guard           General transfer comment: Min guard to steady, pt reaching for single UE support, improved when placed walker in front    Ambulation/Gait Ambulation/Gait assistance: Min guard Gait Distance (Feet): 100 Feet Assistive device: Rolling walker (2 wheels) Gait Pattern/deviations: Step-through pattern;Decreased stride length Gait velocity: decreased     General Gait Details: Lightly utilizing walker for balance, min guard for safety, cues for environmental negotiation  Stairs            Wheelchair Mobility    Modified Rankin (Stroke Patients Only)       Balance Overall balance assessment: Needs assistance Sitting-balance support: Feet supported Sitting balance-Leahy Scale: Fair     Standing balance support: Bilateral upper extremity supported Standing balance-Leahy Scale: Poor                               Pertinent Vitals/Pain Pain Assessment: No/denies pain    Home Living Family/patient expects to be discharged to:: Private residence Living Arrangements: Spouse/significant other Available Help at Discharge: Family;Available 24 hours/day Type of Home: House Home Access: Level entry       Home Layout: Two level;Able to live on main level with bedroom/bathroom Home Equipment: Kasandra Knudsen - single point      Prior Function Prior Level of Function : Needs assist             Mobility Comments: uses cane "sometimes" ADLs Comments: indep; per chart notes pt forgets to complete hygiene tasks,  stays in same clothes for multiple days     Hand Dominance   Dominant Hand: Right    Extremity/Trunk Assessment   Upper Extremity Assessment Upper Extremity Assessment: Overall WFL for tasks assessed    Lower Extremity Assessment Lower Extremity Assessment: Generalized weakness    Cervical / Trunk  Assessment Cervical / Trunk Assessment: Kyphotic  Communication   Communication: No difficulties  Cognition Arousal/Alertness: Awake/alert Behavior During Therapy: Flat affect;Impulsive Overall Cognitive Status: History of cognitive impairments - at baseline                                 General Comments: baseline dementia, only oriented to self. Pt follow some simple commands with repeat cues; Pt's son states this is similar to his baseline. Impulsive movements throughout session with limited safety awareness        General Comments General comments (skin integrity, edema, etc.): VSS on RA    Exercises     Assessment/Plan    PT Assessment Patient does not need any further PT services  PT Problem List         PT Treatment Interventions      PT Goals (Current goals can be found in the Care Plan section)  Acute Rehab PT Goals Patient Stated Goal: pt family would like him to go home PT Goal Formulation: All assessment and education complete, DC therapy    Frequency     Barriers to discharge        Co-evaluation               AM-PAC PT "6 Clicks" Mobility  Outcome Measure Help needed turning from your back to your side while in a flat bed without using bedrails?: A Little Help needed moving from lying on your back to sitting on the side of a flat bed without using bedrails?: A Little Help needed moving to and from a bed to a chair (including a wheelchair)?: A Little Help needed standing up from a chair using your arms (e.g., wheelchair or bedside chair)?: A Little Help needed to walk in hospital room?: A Little Help needed climbing 3-5 steps with a railing? : A Little 6 Click Score: 18    End of Session Equipment Utilized During Treatment: Gait belt Activity Tolerance: Patient tolerated treatment well Patient left: in bed;with call bell/phone within reach;with bed alarm set;with family/visitor present Nurse Communication: Mobility status PT  Visit Diagnosis: Unsteadiness on feet (R26.81);Muscle weakness (generalized) (M62.81);Difficulty in walking, not elsewhere classified (R26.2)    Time: 5465-0354 PT Time Calculation (min) (ACUTE ONLY): 22 min   Charges:   PT Evaluation $PT Eval Moderate Complexity: 1 Mod          Lillia Pauls, PT, DPT Acute Rehabilitation Services Pager [phone] Office [phone]   Norval Morton 08/27/2021, 2:41 PM

## 2021-08-29 ENCOUNTER — Telehealth: Payer: Self-pay

## 2021-08-29 NOTE — Telephone Encounter (Signed)
Spoke with patient's daughter [name] regarding Palliative Care consult. She requested a call back on 12/7 she was at work.

## 2021-09-01 ENCOUNTER — Telehealth: Payer: Self-pay

## 2021-09-01 NOTE — Telephone Encounter (Signed)
Spoke with patient's daughter Rogue Bussing and scheduled an in-person Palliative Consult for 09/26/21 @ 10AM with Dr. Bufford Spikes. Documentation will be noted in [facility]'s EMR [facility].   COVID screening was negative. No pets in home. Patient lives with wife.  Consent obtained; updated Outlook/Netsmart/Team List and Epic.   Family is aware they may be receiving a call from provider the day before or day of to confirm appointment.

## 2021-09-27 ENCOUNTER — Telehealth: Payer: Self-pay

## 2021-09-27 DIAGNOSIS — Z515 Encounter for palliative care: Secondary | ICD-10-CM

## 2021-09-27 NOTE — Telephone Encounter (Signed)
(  1:50 pm). Palliative care SW called the patient's daughter-[name] to provide support and resources (day programs, AL facilities w/memory care, in-home caregiving agencies and private caregivers). [name] shard her concerns regarding her parents and their resistance to help. I normalized this for her and encouraged her to continue to seek resources and care options for long-term care planning.  The daughter was provided my contact information for any additional follow-up or support needed. Palliative care nurse navigator was updated on request for PT/OT services.

## 2021-11-28 ENCOUNTER — Other Ambulatory Visit: Payer: Self-pay

## 2021-11-28 ENCOUNTER — Other Ambulatory Visit: Payer: Medicare PPO | Admitting: Internal Medicine

## 2021-11-29 NOTE — Progress Notes (Signed)
No answer at any listed numbers and no answer to doorbell or knocking.  Waited at home and no one ever came to door or arrived. ?

## 2021-12-04 DIAGNOSIS — E538 Deficiency of other specified B group vitamins: Secondary | ICD-10-CM | POA: Diagnosis not present

## 2022-01-04 DIAGNOSIS — D510 Vitamin B12 deficiency anemia due to intrinsic factor deficiency: Secondary | ICD-10-CM | POA: Diagnosis not present

## 2022-02-02 DIAGNOSIS — D510 Vitamin B12 deficiency anemia due to intrinsic factor deficiency: Secondary | ICD-10-CM | POA: Diagnosis not present

## 2022-03-13 ENCOUNTER — Other Ambulatory Visit: Payer: Medicare Other

## 2022-03-13 DIAGNOSIS — Z515 Encounter for palliative care: Secondary | ICD-10-CM

## 2022-04-05 NOTE — Progress Notes (Signed)
COMMUNITY PALLIATIVE CARE SW NOTE  PATIENT NAME: Shane Juarez DOB: Jul 17, 1930 MRN: [id number]  PRIMARY CARE PROVIDER: [name], MD (Inactive)  RESPONSIBLE PARTY:  Acct ID - Guarantor Home Phone Work Phone Relationship Acct Type  [id number] - Riley Churches*   Self P/F     [address], [place], [place] [address]   SOCIAL WORK TELEPHONIC VISIT (4:20 pm-4:35 pm)   PC SW completed a follow-up with patient to assess his needs and extend support. Patient report that he was doing just fine. He is appetite is good and he has no swallowing issues. He had no issues taking his medications. He has not had any falls. Patient remains independent will all ADL's. There were not caregiver issues or concerns. He advised that his wife is currently seeking someone to clean the home, but he believed she already had someone lined up already. SW reinforced access to support and contact information. No other concerns were noted.   [address] [place], [place]

## 2022-05-08 DIAGNOSIS — E538 Deficiency of other specified B group vitamins: Secondary | ICD-10-CM | POA: Diagnosis not present

## 2022-05-08 DIAGNOSIS — R4189 Other symptoms and signs involving cognitive functions and awareness: Secondary | ICD-10-CM | POA: Diagnosis not present

## 2022-05-08 DIAGNOSIS — Z9181 History of falling: Secondary | ICD-10-CM | POA: Diagnosis not present

## 2022-05-08 DIAGNOSIS — I10 Essential (primary) hypertension: Secondary | ICD-10-CM | POA: Diagnosis not present

## 2022-05-08 DIAGNOSIS — I679 Cerebrovascular disease, unspecified: Secondary | ICD-10-CM | POA: Diagnosis not present

## 2022-05-08 DIAGNOSIS — I739 Peripheral vascular disease, unspecified: Secondary | ICD-10-CM | POA: Diagnosis not present

## 2022-05-08 DIAGNOSIS — N183 Chronic kidney disease, stage 3 unspecified: Secondary | ICD-10-CM | POA: Diagnosis not present

## 2022-05-08 DIAGNOSIS — F0390 Unspecified dementia without behavioral disturbance: Secondary | ICD-10-CM | POA: Diagnosis not present

## 2022-05-08 DIAGNOSIS — E1151 Type 2 diabetes mellitus with diabetic peripheral angiopathy without gangrene: Secondary | ICD-10-CM | POA: Diagnosis not present

## 2022-06-25 DIAGNOSIS — Z9181 History of falling: Secondary | ICD-10-CM | POA: Diagnosis not present

## 2022-06-25 DIAGNOSIS — I739 Peripheral vascular disease, unspecified: Secondary | ICD-10-CM | POA: Diagnosis not present

## 2022-06-25 DIAGNOSIS — N183 Chronic kidney disease, stage 3 unspecified: Secondary | ICD-10-CM | POA: Diagnosis not present

## 2022-06-25 DIAGNOSIS — Z6824 Body mass index (BMI) 24.0-24.9, adult: Secondary | ICD-10-CM | POA: Diagnosis not present

## 2022-06-25 DIAGNOSIS — M7989 Other specified soft tissue disorders: Secondary | ICD-10-CM | POA: Diagnosis not present

## 2022-06-25 DIAGNOSIS — I499 Cardiac arrhythmia, unspecified: Secondary | ICD-10-CM | POA: Diagnosis not present

## 2022-07-02 DIAGNOSIS — R609 Edema, unspecified: Secondary | ICD-10-CM | POA: Diagnosis not present

## 2022-07-03 ENCOUNTER — Emergency Department (HOSPITAL_COMMUNITY): Payer: Medicare PPO

## 2022-07-03 ENCOUNTER — Emergency Department (HOSPITAL_COMMUNITY)
Admission: EM | Admit: 2022-07-03 | Discharge: 2022-07-04 | Disposition: A | Discharge status: Home / Self Care | Payer: Medicare PPO | Attending: Emergency Medicine | Admitting: Emergency Medicine

## 2022-07-03 ENCOUNTER — Other Ambulatory Visit: Payer: Self-pay

## 2022-07-03 ENCOUNTER — Encounter (HOSPITAL_COMMUNITY): Payer: Self-pay

## 2022-07-03 DIAGNOSIS — T461X1A Poisoning by calcium-channel blockers, accidental (unintentional), initial encounter: Secondary | ICD-10-CM | POA: Insufficient documentation

## 2022-07-03 DIAGNOSIS — M47814 Spondylosis without myelopathy or radiculopathy, thoracic region: Secondary | ICD-10-CM | POA: Diagnosis not present

## 2022-07-03 DIAGNOSIS — T50901A Poisoning by unspecified drugs, medicaments and biological substances, accidental (unintentional), initial encounter: Secondary | ICD-10-CM | POA: Diagnosis not present

## 2022-07-03 DIAGNOSIS — I491 Atrial premature depolarization: Secondary | ICD-10-CM | POA: Diagnosis not present

## 2022-07-03 DIAGNOSIS — Z7982 Long term (current) use of aspirin: Secondary | ICD-10-CM | POA: Diagnosis not present

## 2022-07-03 DIAGNOSIS — R001 Bradycardia, unspecified: Secondary | ICD-10-CM | POA: Diagnosis not present

## 2022-07-03 DIAGNOSIS — R739 Hyperglycemia, unspecified: Secondary | ICD-10-CM | POA: Diagnosis not present

## 2022-07-03 DIAGNOSIS — Z79899 Other long term (current) drug therapy: Secondary | ICD-10-CM | POA: Insufficient documentation

## 2022-07-03 DIAGNOSIS — R4182 Altered mental status, unspecified: Secondary | ICD-10-CM | POA: Diagnosis not present

## 2022-07-03 DIAGNOSIS — F03918 Unspecified dementia, unspecified severity, with other behavioral disturbance: Secondary | ICD-10-CM

## 2022-07-03 DIAGNOSIS — I10 Essential (primary) hypertension: Secondary | ICD-10-CM | POA: Diagnosis not present

## 2022-07-03 LAB — CBC WITH DIFFERENTIAL/PLATELET
Abs Immature Granulocytes: 0.03 10*3/uL (ref 0.00–0.07)
Basophils Absolute: 0.1 10*3/uL (ref 0.0–0.1)
Basophils Relative: 1 %
Eosinophils Absolute: 0.1 10*3/uL (ref 0.0–0.5)
Eosinophils Relative: 1 %
HCT: 32 % — ABNORMAL LOW (ref 39.0–52.0)
Hemoglobin: 10.5 g/dL — ABNORMAL LOW (ref 13.0–17.0)
Immature Granulocytes: 1 %
Lymphocytes Relative: 28 %
Lymphs Abs: 1.7 10*3/uL (ref 0.7–4.0)
MCH: 32.4 pg (ref 26.0–34.0)
MCHC: 32.8 g/dL (ref 30.0–36.0)
MCV: 98.8 fL (ref 80.0–100.0)
Monocytes Absolute: 0.6 10*3/uL (ref 0.1–1.0)
Monocytes Relative: 10 %
Neutro Abs: 3.7 10*3/uL (ref 1.7–7.7)
Neutrophils Relative %: 59 %
Platelets: 231 10*3/uL (ref 150–400)
RBC: 3.24 MIL/uL — ABNORMAL LOW (ref 4.22–5.81)
RDW: 15.1 % (ref 11.5–15.5)
WBC: 6.2 10*3/uL (ref 4.0–10.5)
nRBC: 0 % (ref 0.0–0.2)

## 2022-07-03 LAB — COMPREHENSIVE METABOLIC PANEL
ALT: 22 U/L (ref 0–44)
AST: 22 U/L (ref 15–41)
Albumin: 3.5 g/dL (ref 3.5–5.0)
Alkaline Phosphatase: 88 U/L (ref 38–126)
Anion gap: 6 (ref 5–15)
BUN: 14 mg/dL (ref 8–23)
CO2: 24 mmol/L (ref 22–32)
Calcium: 8.5 mg/dL — ABNORMAL LOW (ref 8.9–10.3)
Chloride: 110 mmol/L (ref 98–111)
Creatinine, Ser: 1.25 mg/dL — ABNORMAL HIGH (ref 0.61–1.24)
GFR, Estimated: 55 mL/min — ABNORMAL LOW (ref >60)
Glucose, Bld: 143 mg/dL — ABNORMAL HIGH (ref 70–99)
Potassium: 4.1 mmol/L (ref 3.5–5.1)
Sodium: 140 mmol/L (ref 135–145)
Total Bilirubin: 0.5 mg/dL (ref 0.3–1.2)
Total Protein: 6.8 g/dL (ref 6.5–8.1)

## 2022-07-03 LAB — ACETAMINOPHEN LEVEL: Acetaminophen (Tylenol), Serum: <10 ug/mL — ABNORMAL LOW (ref 10–30)

## 2022-07-03 LAB — ETHANOL: Alcohol, Ethyl (B): <10 mg/dL (ref <10)

## 2022-07-03 LAB — MAGNESIUM: Magnesium: 2.1 mg/dL (ref 1.7–2.4)

## 2022-07-03 LAB — SALICYLATE LEVEL: Salicylate Lvl: <7 mg/dL — ABNORMAL LOW (ref 7.0–30.0)

## 2022-07-03 NOTE — ED Triage Notes (Signed)
Pt arrived by EMS from home after accidental overdose of Amlodipine   Around 2030 family noticed that pt amlodipine bottle was empty, falling thinks that there was about 10 10mg  tablets  left in bottle.   Pt in NSR with PVCs  Cbg 255  Hx of dementia

## 2022-07-03 NOTE — ED Provider Notes (Signed)
Care assumed at 2300.  Patient with history of dementia here for evaluation following possible overdose on amlodipine.  Family noted around 830 that he was missing some of his pills.  Is unclear if he truly did overdose.  Poison control recommendations for observation.  Patient without evidence of toxicity during his ED stay, unfortunately he did develop agitation due to his underlying dementia and required Haldol for sedation.  When patient is back to baseline he will be stable for discharge home in the care of family.  Patient care transferred pending metabolization of Haldol.   Quintella Reichert, MD 07/04/22 0800

## 2022-07-03 NOTE — ED Provider Notes (Signed)
[facility] EMERGENCY DEPARTMENT Provider Note   CSN: [id number] Arrival date & time: 07/03/22  2151     History  Chief Complaint  Patient presents with   Ingestion    Shane Leaf Sr. is a 88 y.o. male.  88 year old male with prior medical history as detailed below presents for evaluation.  Patient with possible overdose on amlodipine.  Around 830 this evening the patient's family noted that the patient's prescription of amlodipine was missing approximately 10 tablets.  Family is concerned that the patient may have taken these tablets.  Patient with history of dementia.  Patient cannot recall or report ingestion of tablets.  Patient is otherwise at baseline.  He is comfortable.  The history is provided by the patient and medical records.       Home Medications Prior to Admission medications   Medication Sig Start Date End Date Taking? Authorizing Provider  amLODipine (NORVASC) 10 MG tablet Take 10 mg by mouth daily. 07/21/18   [provider]  aspirin 81 MG tablet Take 81 mg by mouth daily.    [provider]  atorvastatin (LIPITOR) 20 MG tablet Take 20 mg by mouth every evening. 06/20/18   [provider]      Allergies    Losartan potassium and Nsaids    Review of Systems   Review of Systems  All other systems reviewed and are negative.   Physical Exam Updated Vital Signs BP (!) 178/99   Pulse 87   Temp 97.6 F (36.4 C) (Oral)   Resp 15   SpO2 99%  Physical Exam Vitals and nursing note reviewed.  Constitutional:      General: He is not in acute distress.    Appearance: Normal appearance. He is well-developed.  HENT:     Head: Normocephalic and atraumatic.  Eyes:     Conjunctiva/sclera: Conjunctivae normal.     Pupils: Pupils are equal, round, and reactive to light.  Cardiovascular:     Rate and Rhythm: Normal rate and regular rhythm.     Heart sounds: Normal heart sounds.  Pulmonary:     Effort: Pulmonary  effort is normal. No respiratory distress.     Breath sounds: Normal breath sounds.  Abdominal:     General: There is no distension.     Palpations: Abdomen is soft.     Tenderness: There is no abdominal tenderness.  Musculoskeletal:        General: No deformity. Normal range of motion.     Cervical back: Normal range of motion and neck supple.  Skin:    General: Skin is warm and dry.  Neurological:     General: No focal deficit present.     Mental Status: He is alert and oriented to person, place, and time. Mental status is at baseline.     ED Results / Procedures / Treatments   Labs (all labs ordered are listed, but only abnormal results are displayed) Labs Reviewed  CBC WITH DIFFERENTIAL/PLATELET - Abnormal; Notable for the following components:      Result Value   RBC 3.24 (*)    Hemoglobin 10.5 (*)    HCT 32.0 (*)    All other components within normal limits  COMPREHENSIVE METABOLIC PANEL - Abnormal; Notable for the following components:   Glucose, Bld 143 (*)    Creatinine, Ser 1.25 (*)    Calcium 8.5 (*)    GFR, Estimated 55 (*)    All other components within normal limits  ACETAMINOPHEN LEVEL - Abnormal; Notable for the following components:   Acetaminophen (Tylenol), Serum <10 (*)    All other components within normal limits  SALICYLATE LEVEL - Abnormal; Notable for the following components:   Salicylate Lvl <3.0 (*)    All other components within normal limits  MAGNESIUM  ETHANOL    EKG EKG Interpretation  Date/Time:  Tuesday July 03 2022 22:08:29 EDT Ventricular Rate:  88 PR Interval:  127 QRS Duration: 77 QT Interval:  370 QTC Calculation: 448 R Axis:   -57 Text Interpretation: Sinus rhythm Left anterior fascicular block Abnormal R-wave progression, early transition Confirmed by Dene Gentry [phone]) on 07/03/2022 10:09:38 PM  Radiology DG Chest Port 1 View  Result Date: 07/03/2022 CLINICAL DATA:  Ingestion EXAM: PORTABLE CHEST 1 VIEW  COMPARISON:  08/10/2021 FINDINGS: Shallow inspiration. Heart size and pulmonary vascularity are normal. Lungs are clear. No pleural effusions. No pneumothorax. Mediastinal contours appear intact. Degenerative changes in the spine and shoulders. IMPRESSION: No active disease. Electronically Signed   By: Lucienne Capers M.D.   On: 07/03/2022 22:22    Procedures Procedures    Medications Ordered in ED Medications - No data to display  ED Course/ Medical Decision Making/ A&P                           Medical Decision Making Amount and/or Complexity of Data Reviewed Labs: ordered. Radiology: ordered.    Medical Screen Complete  This patient presented to the ED with complaint of suspected and/or possible ingestion.  This complaint involves an extensive number of treatment options. The initial differential diagnosis includes, but is not limited to, possible ingestion of amlodipine, metabolic abnormality, etc.  This presentation is: Acute, Chronic, Self-Limited, Previously Undiagnosed, Uncertain Prognosis, Complicated, Systemic Symptoms, and Threat to Life/Bodily Function  Patient is presenting with suspected overdose.  Family cannot confirm actual witnessed ingestion.  Patient with a prescription of amlodipine.  Apparently 10 tablets were missing.  Patient's mental status is at baseline on arrival.  Patient without noted hypotension.  Timeframe of possible ingestion is completely unclear.  If hypotension develops will administer IV fluids and or consider pressors.  Per poison control, patient will require 4 to 6 hours of observation prior to clearance.  Pending reevaluation signed out to oncoming EDP.  Additional history obtained: External records from outside sources obtained and reviewed including prior ED visits and prior Inpatient records.    Lab Tests:  I ordered and personally interpreted labs.  The pertinent results include: Acetaminophen, salicylate, EtOH, CBC, CMP,  magnesium   Imaging Studies ordered:  I ordered imaging studies including chest x-ray I independently visualized and interpreted obtained imaging which showed NAD I agree with the radiologist interpretation.   Cardiac Monitoring:  The patient was maintained on a cardiac monitor.  I personally viewed and interpreted the cardiac monitor which showed an underlying rhythm of: NSR  Problem List / ED Course:  Suspected overdose   Reevaluation:  After the interventions noted above, I reevaluated the patient and found that they have: improved  Disposition:  After consideration of the diagnostic results and the patients response to treatment, I feel that the patent would benefit from completion of ED evaluation.          Final Clinical Impression(s) / ED Diagnoses Final diagnoses:  Accidental overdose, initial encounter    Rx / DC Orders ED Discharge Orders     None  Valarie Merino, MD 07/03/22 [phone]

## 2022-07-04 MED ORDER — HALOPERIDOL LACTATE 5 MG/ML IJ SOLN
2.0000 mg | Freq: Once | INTRAMUSCULAR | Status: AC
  Administered 2022-07-04: 2 mg via INTRAVENOUS
  Filled 2022-07-04: qty 1

## 2022-07-04 MED ORDER — HALOPERIDOL LACTATE 5 MG/ML IJ SOLN
2.0000 mg | Freq: Once | INTRAMUSCULAR | Status: AC
  Administered 2022-07-04: 2 mg via INTRAMUSCULAR

## 2022-07-04 NOTE — ED Notes (Signed)
RN and NT changed pt linens and gown. Pt has on brief.

## 2022-07-04 NOTE — ED Notes (Signed)
RN gave pt apple juice

## 2022-07-06 DIAGNOSIS — E785 Hyperlipidemia, unspecified: Secondary | ICD-10-CM | POA: Diagnosis not present

## 2022-07-06 DIAGNOSIS — Z9181 History of falling: Secondary | ICD-10-CM | POA: Diagnosis not present

## 2022-07-06 DIAGNOSIS — N183 Chronic kidney disease, stage 3 unspecified: Secondary | ICD-10-CM | POA: Diagnosis not present

## 2022-07-06 DIAGNOSIS — T50901A Poisoning by unspecified drugs, medicaments and biological substances, accidental (unintentional), initial encounter: Secondary | ICD-10-CM | POA: Diagnosis not present

## 2022-07-06 DIAGNOSIS — L602 Onychogryphosis: Secondary | ICD-10-CM | POA: Diagnosis not present

## 2022-07-06 DIAGNOSIS — R600 Localized edema: Secondary | ICD-10-CM | POA: Diagnosis not present

## 2022-07-06 DIAGNOSIS — F0390 Unspecified dementia without behavioral disturbance: Secondary | ICD-10-CM | POA: Diagnosis not present

## 2022-07-06 DIAGNOSIS — E1151 Type 2 diabetes mellitus with diabetic peripheral angiopathy without gangrene: Secondary | ICD-10-CM | POA: Diagnosis not present

## 2022-07-10 DIAGNOSIS — T50901D Poisoning by unspecified drugs, medicaments and biological substances, accidental (unintentional), subsequent encounter: Secondary | ICD-10-CM | POA: Diagnosis not present

## 2022-07-10 DIAGNOSIS — R600 Localized edema: Secondary | ICD-10-CM | POA: Diagnosis not present

## 2022-07-10 DIAGNOSIS — F0390 Unspecified dementia without behavioral disturbance: Secondary | ICD-10-CM | POA: Diagnosis not present

## 2022-07-10 DIAGNOSIS — Z9181 History of falling: Secondary | ICD-10-CM | POA: Diagnosis not present

## 2022-07-24 ENCOUNTER — Ambulatory Visit (INDEPENDENT_AMBULATORY_CARE_PROVIDER_SITE_OTHER): Payer: Medicare PPO | Admitting: Podiatry

## 2022-07-24 DIAGNOSIS — Z91199 Patient's noncompliance with other medical treatment and regimen due to unspecified reason: Secondary | ICD-10-CM

## 2022-07-26 NOTE — Progress Notes (Signed)
Patient was no-show for appointment today 

## 2022-07-27 ENCOUNTER — Emergency Department (HOSPITAL_COMMUNITY): Payer: Medicare PPO

## 2022-07-27 ENCOUNTER — Other Ambulatory Visit: Payer: Self-pay

## 2022-07-27 ENCOUNTER — Emergency Department (HOSPITAL_COMMUNITY)
Admission: EM | Admit: 2022-07-27 | Discharge: 2022-07-27 | Disposition: A | Discharge status: Home / Self Care | Payer: Medicare PPO | Attending: Emergency Medicine | Admitting: Emergency Medicine

## 2022-07-27 DIAGNOSIS — Z79899 Other long term (current) drug therapy: Secondary | ICD-10-CM | POA: Insufficient documentation

## 2022-07-27 DIAGNOSIS — M4312 Spondylolisthesis, cervical region: Secondary | ICD-10-CM | POA: Diagnosis not present

## 2022-07-27 DIAGNOSIS — M5031 Other cervical disc degeneration,  high cervical region: Secondary | ICD-10-CM | POA: Diagnosis not present

## 2022-07-27 DIAGNOSIS — R4701 Aphasia: Secondary | ICD-10-CM | POA: Diagnosis not present

## 2022-07-27 DIAGNOSIS — R4182 Altered mental status, unspecified: Secondary | ICD-10-CM | POA: Diagnosis not present

## 2022-07-27 DIAGNOSIS — I6782 Cerebral ischemia: Secondary | ICD-10-CM | POA: Diagnosis not present

## 2022-07-27 DIAGNOSIS — W19XXXA Unspecified fall, initial encounter: Secondary | ICD-10-CM

## 2022-07-27 DIAGNOSIS — Z7982 Long term (current) use of aspirin: Secondary | ICD-10-CM | POA: Diagnosis not present

## 2022-07-27 DIAGNOSIS — M50321 Other cervical disc degeneration at C4-C5 level: Secondary | ICD-10-CM | POA: Diagnosis not present

## 2022-07-27 DIAGNOSIS — I10 Essential (primary) hypertension: Secondary | ICD-10-CM | POA: Diagnosis not present

## 2022-07-27 DIAGNOSIS — F0390 Unspecified dementia without behavioral disturbance: Secondary | ICD-10-CM

## 2022-07-27 DIAGNOSIS — I639 Cerebral infarction, unspecified: Secondary | ICD-10-CM | POA: Diagnosis not present

## 2022-07-27 LAB — CBC
HCT: 34.7 % — ABNORMAL LOW (ref 39.0–52.0)
Hemoglobin: 11.1 g/dL — ABNORMAL LOW (ref 13.0–17.0)
MCH: 32.6 pg (ref 26.0–34.0)
MCHC: 32 g/dL (ref 30.0–36.0)
MCV: 102.1 fL — ABNORMAL HIGH (ref 80.0–100.0)
Platelets: 236 10*3/uL (ref 150–400)
RBC: 3.4 MIL/uL — ABNORMAL LOW (ref 4.22–5.81)
RDW: 14.6 % (ref 11.5–15.5)
WBC: 6 10*3/uL (ref 4.0–10.5)
nRBC: 0 % (ref 0.0–0.2)

## 2022-07-27 LAB — COMPREHENSIVE METABOLIC PANEL
ALT: 14 U/L (ref 0–44)
AST: 18 U/L (ref 15–41)
Albumin: 3.5 g/dL (ref 3.5–5.0)
Alkaline Phosphatase: 70 U/L (ref 38–126)
Anion gap: 6 (ref 5–15)
BUN: 15 mg/dL (ref 8–23)
CO2: 25 mmol/L (ref 22–32)
Calcium: 8.5 mg/dL — ABNORMAL LOW (ref 8.9–10.3)
Chloride: 110 mmol/L (ref 98–111)
Creatinine, Ser: 1.19 mg/dL (ref 0.61–1.24)
GFR, Estimated: 58 mL/min — ABNORMAL LOW (ref >60)
Glucose, Bld: 120 mg/dL — ABNORMAL HIGH (ref 70–99)
Potassium: 4.1 mmol/L (ref 3.5–5.1)
Sodium: 141 mmol/L (ref 135–145)
Total Bilirubin: 0.7 mg/dL (ref 0.3–1.2)
Total Protein: 6.8 g/dL (ref 6.5–8.1)

## 2022-07-27 NOTE — ED Notes (Signed)
Pt to CT scan via stretcher.

## 2022-07-27 NOTE — ED Provider Notes (Signed)
[facility]-EMERGENCY DEPT Provider Note   CSN: [id number] Arrival date & time: 07/27/22  4098     History  Chief Complaint  Patient presents with   Shane Ned Sr. is a 88 y.o. male.  HPI Level 5 caveat secondary to patient's aphasia and inability to answer my questions 88 year old male reportedly transported from home with reports that he had a mechanical fall this morning. EMS reports no complaints of pain Was at home and they report that they Discussed with wife I attempted to contact daughter Ernest Haber at [phone] mailbox is full and nobody answered Attempted to call wife at the [facility] at [phone]-number is busy     Home Medications Prior to Admission medications   Medication Sig Start Date End Date Taking? Authorizing Provider  amLODipine (NORVASC) 10 MG tablet Take 10 mg by mouth daily. 07/21/18   [provider]  aspirin 81 MG tablet Take 81 mg by mouth daily.    [provider]  atorvastatin (LIPITOR) 20 MG tablet Take 20 mg by mouth every evening. 06/20/18   [provider]      Allergies    Losartan potassium and Nsaids    Review of Systems   Review of Systems  Physical Exam Updated Vital Signs BP (!) 158/69   Pulse 71   Temp (!) 97.4 F (36.3 C) (Oral)   Resp 16   Ht 1.702 m (5\' 7" )   Wt 80 kg   SpO2 100%   BMI 27.62 kg/m  Physical Exam Vitals and nursing note reviewed.  Constitutional:      General: He is not in acute distress.    Appearance: Normal appearance.  HENT:     Head: Normocephalic and atraumatic.     Right Ear: External ear normal.     Left Ear: External ear normal.     Nose: Nose normal.     Mouth/Throat:     Pharynx: Oropharynx is clear.  Eyes:     Extraocular Movements: Extraocular movements intact.     Pupils: Pupils are equal, round, and reactive to light.  Cardiovascular:     Rate and Rhythm: Normal rate and regular rhythm.  Pulmonary:     Effort: Pulmonary  effort is normal.  Abdominal:     Palpations: Abdomen is soft.  Musculoskeletal:        General: Normal range of motion.     Cervical back: Normal range of motion.     Comments: Some old bruising noted right thigh No tenderness palpation over hips, pelvis, lower extremity Full active range of motion bilateral lower extremities Bilateral upper extremities appear normal and have full active range of motion with no point tenderness Palpated cervical, thoracic, and lumbar spine without any signs of trauma No external signs of trauma are noted on trunk or back  Skin:    General: Skin is warm and dry.     Capillary Refill: Capillary refill takes less than 2 seconds.  Neurological:     Mental Status: He is alert.     Comments: Patient appears to be aphasic     ED Results / Procedures / Treatments   Labs (all labs ordered are listed, but only abnormal results are displayed) Labs Reviewed  CBC - Abnormal; Notable for the following components:      Result Value   RBC 3.40 (*)    Hemoglobin 11.1 (*)    HCT 34.7 (*)    MCV 102.1 (*)  All other components within normal limits  COMPREHENSIVE METABOLIC PANEL - Abnormal; Notable for the following components:   Glucose, Bld 120 (*)    Calcium 8.5 (*)    GFR, Estimated 58 (*)    All other components within normal limits    EKG None  Radiology CT Head Wo Contrast  Result Date: 07/27/2022 CLINICAL DATA:  Fall. EXAM: CT HEAD WITHOUT CONTRAST CT CERVICAL SPINE WITHOUT CONTRAST TECHNIQUE: Multidetector CT imaging of the head and cervical spine was performed following the standard protocol without intravenous contrast. Multiplanar CT image reconstructions of the cervical spine were also generated. RADIATION DOSE REDUCTION: This exam was performed according to the departmental dose-optimization program which includes automated exposure control, adjustment of the mA and/or kV according to patient size and/or use of iterative reconstruction  technique. COMPARISON:  August 26, 2021. FINDINGS: CT HEAD FINDINGS Brain: Mild chronic ischemic white matter disease is noted. 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 No mass effect or midline shift is noted. Ventricular size is within normal limits. There is no evidence of mass lesion, hemorrhage or acute infarction. Vascular: No hyperdense vessel or unexpected calcification. Skull: Normal. Negative for fracture or focal lesion. Sinuses/Orbits: No acute finding. Other: None. CT CERVICAL SPINE FINDINGS Alignment: Minimal grade 1 retrolisthesis of C3-4 and C5-6 is noted secondary to severe degenerative disc disease. Skull base and vertebrae: No acute fracture. No primary bone lesion or focal pathologic process. Soft tissues and spinal canal: No prevertebral fluid or swelling. No visible canal hematoma. Disc levels: Severe degenerative disc disease is noted at C3-4 and C5-6. Mild degenerative disc disease is noted at C4-5 and C6-7. Upper chest: Negative. Other: None. IMPRESSION: No acute intracranial abnormality seen. Multilevel degenerative disc disease is noted the cervical spine. No acute abnormality is noted. Electronically Signed   By: Marijo Conception M.D.   On: 07/27/2022 08:29   CT Cervical Spine Wo Contrast  Result Date: 07/27/2022 CLINICAL DATA:  Fall. EXAM: CT HEAD WITHOUT CONTRAST CT CERVICAL SPINE WITHOUT CONTRAST TECHNIQUE: Multidetector CT imaging of the head and cervical spine was performed following the standard protocol without intravenous contrast. Multiplanar CT image reconstructions of the cervical spine were also generated. RADIATION DOSE REDUCTION: This exam was performed according to the departmental dose-optimization program which includes automated exposure control, adjustment of the mA and/or kV according to patient size and/or use of iterative reconstruction technique. COMPARISON:  August 26, 2021. FINDINGS: CT HEAD FINDINGS Brain: Mild chronic ischemic white matter disease is  noted. 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 No mass effect or midline shift is noted. Ventricular size is within normal limits. There is no evidence of mass lesion, hemorrhage or acute infarction. Vascular: No hyperdense vessel or unexpected calcification. Skull: Normal. Negative for fracture or focal lesion. Sinuses/Orbits: No acute finding. Other: None. CT CERVICAL SPINE FINDINGS Alignment: Minimal grade 1 retrolisthesis of C3-4 and C5-6 is noted secondary to severe degenerative disc disease. Skull base and vertebrae: No acute fracture. No primary bone lesion or focal pathologic process. Soft tissues and spinal canal: No prevertebral fluid or swelling. No visible canal hematoma. Disc levels: Severe degenerative disc disease is noted at C3-4 and C5-6. Mild degenerative disc disease is noted at C4-5 and C6-7. Upper chest: Negative. Other: None. IMPRESSION: No acute intracranial abnormality seen. Multilevel degenerative disc disease is noted the cervical spine. No acute abnormality is noted. Electronically Signed   By: Marijo Conception M.D.   On: 07/27/2022 08:29   DG Chest [facility] [address]  Result Date: 07/27/2022 CLINICAL DATA:  Fall.  Altered mental status. EXAM: PORTABLE CHEST 1 VIEW COMPARISON:  AP chest 07/03/2022 FINDINGS: Cardiac silhouette and mediastinal contours are within normal limits. Mild calcification within aortic arch. Mildly decreased lung volumes. The lungs are clear. No pleural effusion or pneumothorax. Moderate degenerative bridging osteophytes of the thoracic spine. IMPRESSION: No active disease. Electronically Signed   By: Neita Garnet M.D.   On: 07/27/2022 08:13    Procedures Procedures    Medications Ordered in ED Medications - No data to display  ED Course/ Medical Decision Making/ A&P Clinical Course as of 07/27/22 1005  Fri Jul 27, 2022  2992 CT reviewed interpreted no evidence of acute intracranial abnormality seen and radiologist interpretation concurs [DR]  0854  Chest x-Blu Mcglaun reviewed and interpreted with no evidence of acute process noted and radiologist interpretation concurs [DR]  0854 Complete metabolic panel reviewed interpreted with mild hyperglycemia mild hypocalcemia [DR]  0854 CBC reviewed and interpreted with stable anemia noted [DR]    Clinical Course User Index [DR] Margarita Grizzle, MD                           Medical Decision Making 88 year old male with history of dementia with reported fall.  Unable to access the wife or daughter  Work-up done here including CBC, complete metabolic panel, imaging of head and neck with CT scan, chest x-Maxie Slovacek, EKG Obvious signs of trauma from fall No acute intracranial abnormality noted on CT scan Labs remained stable with stable anemia is noted Continue to be unable to contact family Patient appears to be at baseline from records When able to contact family, likely d/c if no new concerns Wife at bedside.  She is states that she is not clear whether it was a mechanical fall or passing out.  Patient is now at baseline.  She has a car with her and feels that she is able to come home in the car.  Amount and/or Complexity of Data Reviewed External Data Reviewed: labs. Labs: ordered. Decision-making details documented in ED Course. Radiology: ordered and independent interpretation performed. Decision-making details documented in ED Course. ECG/medicine tests: ordered and independent interpretation performed. Decision-making details documented in ED Course.    Details: Patient maintained on monitor and hypertensive with normal heart rate, respiratory rate, oxygen saturations           Final Clinical Impression(s) / ED Diagnoses Final diagnoses:  Fall, initial encounter  Dementia without behavioral disturbance, psychotic disturbance, mood disturbance, or anxiety, unspecified dementia severity, unspecified dementia type [facility])    Rx / DC Orders ED Discharge Orders     None         Margarita Grizzle, MD 07/27/22 1005

## 2022-07-27 NOTE — ED Notes (Signed)
Pt was found ambulating in his room. Pt was very unsteady on his feet and placed back in the bed. Bed in low position. Call bell in reach

## 2022-07-27 NOTE — Discharge Instructions (Signed)
Mr. Newstrom was evaluated in the emergency department for a fall.  He had labs, head CT, and EKG. Head CT and neck CT did not show any evidence of injury. Patient has some anemia but this is stable from prior and he does not appear to have any symptoms. Patient appears to be at his baseline and stable for discharge to home. Please return if you have any new concerns or he needs reevaluation

## 2022-07-27 NOTE — ED Triage Notes (Signed)
BIBA from home for fall, stood up to go to restroom and fell on bottom, no loc, no blood thinners, no c/o pain

## 2022-08-01 DIAGNOSIS — E1151 Type 2 diabetes mellitus with diabetic peripheral angiopathy without gangrene: Secondary | ICD-10-CM | POA: Diagnosis not present

## 2022-08-22 DIAGNOSIS — D510 Vitamin B12 deficiency anemia due to intrinsic factor deficiency: Secondary | ICD-10-CM | POA: Diagnosis not present

## 2022-09-10 DIAGNOSIS — Z9181 History of falling: Secondary | ICD-10-CM | POA: Diagnosis not present

## 2022-09-10 DIAGNOSIS — I10 Essential (primary) hypertension: Secondary | ICD-10-CM | POA: Diagnosis not present

## 2022-09-10 DIAGNOSIS — E782 Mixed hyperlipidemia: Secondary | ICD-10-CM | POA: Diagnosis not present

## 2022-09-10 DIAGNOSIS — N183 Chronic kidney disease, stage 3 unspecified: Secondary | ICD-10-CM | POA: Diagnosis not present

## 2022-09-10 DIAGNOSIS — E1151 Type 2 diabetes mellitus with diabetic peripheral angiopathy without gangrene: Secondary | ICD-10-CM | POA: Diagnosis not present

## 2022-09-10 DIAGNOSIS — Z23 Encounter for immunization: Secondary | ICD-10-CM | POA: Diagnosis not present

## 2022-09-10 DIAGNOSIS — F02B0 Dementia in other diseases classified elsewhere, moderate, without behavioral disturbance, psychotic disturbance, mood disturbance, and anxiety: Secondary | ICD-10-CM | POA: Diagnosis not present

## 2022-09-10 DIAGNOSIS — I679 Cerebrovascular disease, unspecified: Secondary | ICD-10-CM | POA: Diagnosis not present

## 2022-09-10 DIAGNOSIS — E538 Deficiency of other specified B group vitamins: Secondary | ICD-10-CM | POA: Diagnosis not present

## 2022-11-12 ENCOUNTER — Inpatient Hospital Stay (HOSPITAL_COMMUNITY)
Admission: EM | Admit: 2022-11-12 | Discharge: 2022-11-22 | DRG: 177 | Disposition: A | Discharge status: Skilled Nursing Facility | Payer: Medicare Other | Attending: Internal Medicine | Admitting: Internal Medicine

## 2022-11-12 ENCOUNTER — Emergency Department (HOSPITAL_COMMUNITY): Payer: Medicare Other

## 2022-11-12 DIAGNOSIS — E785 Hyperlipidemia, unspecified: Secondary | ICD-10-CM | POA: Diagnosis present

## 2022-11-12 DIAGNOSIS — I959 Hypotension, unspecified: Secondary | ICD-10-CM | POA: Diagnosis not present

## 2022-11-12 DIAGNOSIS — E1151 Type 2 diabetes mellitus with diabetic peripheral angiopathy without gangrene: Secondary | ICD-10-CM | POA: Diagnosis present

## 2022-11-12 DIAGNOSIS — G9341 Metabolic encephalopathy: Secondary | ICD-10-CM | POA: Diagnosis present

## 2022-11-12 DIAGNOSIS — N1831 Chronic kidney disease, stage 3a: Secondary | ICD-10-CM | POA: Diagnosis not present

## 2022-11-12 DIAGNOSIS — F0390 Unspecified dementia without behavioral disturbance: Secondary | ICD-10-CM | POA: Diagnosis not present

## 2022-11-12 DIAGNOSIS — I700 Atherosclerosis of aorta: Secondary | ICD-10-CM | POA: Diagnosis not present

## 2022-11-12 DIAGNOSIS — N179 Acute kidney failure, unspecified: Secondary | ICD-10-CM | POA: Diagnosis not present

## 2022-11-12 DIAGNOSIS — W19XXXA Unspecified fall, initial encounter: Secondary | ICD-10-CM | POA: Diagnosis not present

## 2022-11-12 DIAGNOSIS — Z87891 Personal history of nicotine dependence: Secondary | ICD-10-CM

## 2022-11-12 DIAGNOSIS — I10 Essential (primary) hypertension: Secondary | ICD-10-CM | POA: Diagnosis present

## 2022-11-12 DIAGNOSIS — B888 Other specified infestations: Secondary | ICD-10-CM | POA: Diagnosis not present

## 2022-11-12 DIAGNOSIS — I69393 Ataxia following cerebral infarction: Secondary | ICD-10-CM

## 2022-11-12 DIAGNOSIS — Z7982 Long term (current) use of aspirin: Secondary | ICD-10-CM

## 2022-11-12 DIAGNOSIS — D631 Anemia in chronic kidney disease: Secondary | ICD-10-CM | POA: Diagnosis present

## 2022-11-12 DIAGNOSIS — Z1152 Encounter for screening for COVID-19: Secondary | ICD-10-CM

## 2022-11-12 DIAGNOSIS — E1122 Type 2 diabetes mellitus with diabetic chronic kidney disease: Secondary | ICD-10-CM | POA: Diagnosis present

## 2022-11-12 DIAGNOSIS — J690 Pneumonitis due to inhalation of food and vomit: Principal | ICD-10-CM | POA: Diagnosis present

## 2022-11-12 DIAGNOSIS — E1142 Type 2 diabetes mellitus with diabetic polyneuropathy: Secondary | ICD-10-CM | POA: Diagnosis present

## 2022-11-12 DIAGNOSIS — R42 Dizziness and giddiness: Secondary | ICD-10-CM | POA: Diagnosis not present

## 2022-11-12 DIAGNOSIS — Z8249 Family history of ischemic heart disease and other diseases of the circulatory system: Secondary | ICD-10-CM

## 2022-11-12 DIAGNOSIS — R55 Syncope and collapse: Secondary | ICD-10-CM | POA: Diagnosis not present

## 2022-11-12 DIAGNOSIS — N390 Urinary tract infection, site not specified: Secondary | ICD-10-CM | POA: Diagnosis not present

## 2022-11-12 DIAGNOSIS — R000 Tachycardia, unspecified: Secondary | ICD-10-CM | POA: Diagnosis not present

## 2022-11-12 DIAGNOSIS — Z66 Do not resuscitate: Secondary | ICD-10-CM | POA: Diagnosis present

## 2022-11-12 DIAGNOSIS — E669 Obesity, unspecified: Secondary | ICD-10-CM | POA: Diagnosis present

## 2022-11-12 DIAGNOSIS — Z79899 Other long term (current) drug therapy: Secondary | ICD-10-CM

## 2022-11-12 DIAGNOSIS — J168 Pneumonia due to other specified infectious organisms: Secondary | ICD-10-CM | POA: Diagnosis not present

## 2022-11-12 DIAGNOSIS — Z886 Allergy status to analgesic agent status: Secondary | ICD-10-CM

## 2022-11-12 DIAGNOSIS — B9689 Other specified bacterial agents as the cause of diseases classified elsewhere: Secondary | ICD-10-CM | POA: Diagnosis present

## 2022-11-12 DIAGNOSIS — J189 Pneumonia, unspecified organism: Secondary | ICD-10-CM | POA: Diagnosis present

## 2022-11-12 DIAGNOSIS — R4182 Altered mental status, unspecified: Secondary | ICD-10-CM | POA: Diagnosis not present

## 2022-11-12 DIAGNOSIS — Z888 Allergy status to other drugs, medicaments and biological substances status: Secondary | ICD-10-CM

## 2022-11-12 DIAGNOSIS — Z833 Family history of diabetes mellitus: Secondary | ICD-10-CM

## 2022-11-12 DIAGNOSIS — I129 Hypertensive chronic kidney disease with stage 1 through stage 4 chronic kidney disease, or unspecified chronic kidney disease: Secondary | ICD-10-CM | POA: Diagnosis present

## 2022-11-12 LAB — CBC WITH DIFFERENTIAL/PLATELET
Abs Immature Granulocytes: 0.05 10*3/uL (ref 0.00–0.07)
Basophils Absolute: 0 10*3/uL (ref 0.0–0.1)
Basophils Relative: 0 %
Eosinophils Absolute: 0.1 10*3/uL (ref 0.0–0.5)
Eosinophils Relative: 0 %
HCT: 28.6 % — ABNORMAL LOW (ref 39.0–52.0)
Hemoglobin: 9 g/dL — ABNORMAL LOW (ref 13.0–17.0)
Immature Granulocytes: 0 %
Lymphocytes Relative: 13 %
Lymphs Abs: 1.8 10*3/uL (ref 0.7–4.0)
MCH: 31.7 pg (ref 26.0–34.0)
MCHC: 31.5 g/dL (ref 30.0–36.0)
MCV: 100.7 fL — ABNORMAL HIGH (ref 80.0–100.0)
Monocytes Absolute: 0.8 10*3/uL (ref 0.1–1.0)
Monocytes Relative: 6 %
Neutro Abs: 10.8 10*3/uL — ABNORMAL HIGH (ref 1.7–7.7)
Neutrophils Relative %: 81 %
Platelets: 239 10*3/uL (ref 150–400)
RBC: 2.84 MIL/uL — ABNORMAL LOW (ref 4.22–5.81)
RDW: 14.2 % (ref 11.5–15.5)
WBC: 13.5 10*3/uL — ABNORMAL HIGH (ref 4.0–10.5)
nRBC: 0 % (ref 0.0–0.2)

## 2022-11-12 LAB — GLUCOSE, CAPILLARY: Glucose-Capillary: 128 mg/dL — ABNORMAL HIGH (ref 70–99)

## 2022-11-12 LAB — BASIC METABOLIC PANEL
Anion gap: 8 (ref 5–15)
BUN: 27 mg/dL — ABNORMAL HIGH (ref 8–23)
CO2: 25 mmol/L (ref 22–32)
Calcium: 8.2 mg/dL — ABNORMAL LOW (ref 8.9–10.3)
Chloride: 107 mmol/L (ref 98–111)
Creatinine, Ser: 1.61 mg/dL — ABNORMAL HIGH (ref 0.61–1.24)
GFR, Estimated: 40 mL/min — ABNORMAL LOW (ref >60)
Glucose, Bld: 149 mg/dL — ABNORMAL HIGH (ref 70–99)
Potassium: 3.8 mmol/L (ref 3.5–5.1)
Sodium: 140 mmol/L (ref 135–145)

## 2022-11-12 LAB — URINALYSIS, ROUTINE W REFLEX MICROSCOPIC
Bilirubin Urine: NEGATIVE
Glucose, UA: NEGATIVE mg/dL
Ketones, ur: NEGATIVE mg/dL
Nitrite: POSITIVE — AB
Protein, ur: NEGATIVE mg/dL
Specific Gravity, Urine: 1.025 (ref 1.005–1.030)
pH: 5.5 (ref 5.0–8.0)

## 2022-11-12 LAB — RESP PANEL BY RT-PCR (RSV, FLU A&B, COVID)  RVPGX2
Influenza A by PCR: NEGATIVE
Influenza B by PCR: NEGATIVE
Resp Syncytial Virus by PCR: NEGATIVE
SARS Coronavirus 2 by RT PCR: NEGATIVE

## 2022-11-12 LAB — URINALYSIS, MICROSCOPIC (REFLEX): WBC, UA: >50 WBC/hpf (ref 0–5)

## 2022-11-12 LAB — RAPID URINE DRUG SCREEN, HOSP PERFORMED
Amphetamines: NOT DETECTED
Barbiturates: NOT DETECTED
Benzodiazepines: NOT DETECTED
Cocaine: NOT DETECTED
Opiates: NOT DETECTED
Tetrahydrocannabinol: NOT DETECTED

## 2022-11-12 LAB — TROPONIN I (HIGH SENSITIVITY)
Troponin I (High Sensitivity): 20 ng/L — ABNORMAL HIGH (ref <18)
Troponin I (High Sensitivity): 21 ng/L — ABNORMAL HIGH (ref <18)

## 2022-11-12 LAB — ETHANOL: Alcohol, Ethyl (B): <10 mg/dL (ref <10)

## 2022-11-12 MED ORDER — INSULIN ASPART 100 UNIT/ML IJ SOLN
0.0000 [IU] | Freq: Three times a day (TID) | INTRAMUSCULAR | Status: DC
  Administered 2022-11-14 – 2022-11-20 (×11): 1 [IU] via SUBCUTANEOUS

## 2022-11-12 MED ORDER — LACTATED RINGERS IV SOLN: INTRAVENOUS | Status: AC

## 2022-11-12 MED ORDER — SODIUM CHLORIDE 0.9 % IV SOLN
1.0000 g | Freq: Once | INTRAVENOUS | Status: AC
  Administered 2022-11-12: 1 g via INTRAVENOUS
  Filled 2022-11-12: qty 10

## 2022-11-12 MED ORDER — ACETAMINOPHEN 650 MG RE SUPP: 650.0000 mg | Freq: Four times a day (QID) | RECTAL | Status: DC | PRN

## 2022-11-12 MED ORDER — INSULIN ASPART 100 UNIT/ML IJ SOLN
0.0000 [IU] | Freq: Every day | INTRAMUSCULAR | Status: DC
  Administered 2022-11-21: 2 [IU] via SUBCUTANEOUS

## 2022-11-12 MED ORDER — ONDANSETRON HCL 4 MG PO TABS: 4.0000 mg | ORAL_TABLET | Freq: Four times a day (QID) | ORAL | Status: DC | PRN

## 2022-11-12 MED ORDER — ACETAMINOPHEN 325 MG PO TABS
650.0000 mg | ORAL_TABLET | Freq: Four times a day (QID) | ORAL | Status: DC | PRN
  Administered 2022-11-16 – 2022-11-21 (×3): 650 mg via ORAL
  Filled 2022-11-12 (×3): qty 2

## 2022-11-12 MED ORDER — ONDANSETRON HCL 4 MG/2ML IJ SOLN: 4.0000 mg | Freq: Four times a day (QID) | INTRAMUSCULAR | Status: DC | PRN

## 2022-11-12 MED ORDER — AMLODIPINE BESYLATE 10 MG PO TABS
10.0000 mg | ORAL_TABLET | Freq: Every day | ORAL | Status: DC
  Administered 2022-11-13 – 2022-11-21 (×9): 10 mg via ORAL
  Filled 2022-11-12 (×10): qty 1

## 2022-11-12 MED ORDER — SODIUM CHLORIDE 0.9 % IV SOLN
500.0000 mg | INTRAVENOUS | Status: DC
  Filled 2022-11-12: qty 5

## 2022-11-12 MED ORDER — AZITHROMYCIN 500 MG PO TABS
500.0000 mg | ORAL_TABLET | Freq: Every day | ORAL | Status: DC
  Administered 2022-11-12 – 2022-11-16 (×5): 500 mg via ORAL
  Filled 2022-11-12 (×4): qty 1
  Filled 2022-11-12: qty 2

## 2022-11-12 MED ORDER — SODIUM CHLORIDE 0.9 % IV SOLN
2.0000 g | INTRAVENOUS | Status: DC
  Administered 2022-11-13 – 2022-11-16 (×4): 2 g via INTRAVENOUS
  Filled 2022-11-12 (×4): qty 20

## 2022-11-12 NOTE — ED Triage Notes (Signed)
Patient BIB EMS from home due to a fall. EMS states patient had been feeling dizzy, while sitting in chair patient had witnessed syncopal episode and fell on the ground. Patient was unresponsive at first with EMS, now responds to voice. Patient has h/x alzheimers and dementia. Patient lives with wife at home.

## 2022-11-12 NOTE — ED Notes (Signed)
ED TO INPATIENT HANDOFF REPORT  ED Nurse Name and Phone #:  [phone]  S Name/Age/Gender Shane Leaf Sr. 89 y.o. male Room/Bed: 004C/004C  Code Status   Code Status: Prior  Home/SNF/Other Home Patient oriented to: self Is this baseline? Yes   Triage Complete: Triage complete  Chief Complaint LLL pneumonia [J18.9]  Triage Note Patient BIB EMS from home due to a fall. EMS states patient had been feeling dizzy, while sitting in chair patient had witnessed syncopal episode and fell on the ground. Patient was unresponsive at first with EMS, now responds to voice. Patient has h/x alzheimers and dementia. Patient lives with wife at home.   Allergies Allergies  Allergen Reactions   Losartan Potassium     Other reaction(s): made him sick   Nsaids     Other reaction(s): elevated kidney tests    Level of Care/Admitting Diagnosis ED Disposition     ED Disposition  Admit   Condition  --   Comment  Hospital Area: [facility] [[id number]]  Level of Care: Med-Surg [16]  May place patient in observation at [facility] or [place] if equivalent level of care is available:: No  Covid Evaluation: Asymptomatic - no recent exposure (last 10 days) testing not required  Diagnosis: LLL pneumonia [id number]  Admitting Physician: Bridgett Larsson, [place]  Attending Physician: Bridgett Larsson, [facility] [[id number]]          B Medical/Surgery History Past Medical History:  Diagnosis Date   B12 deficiency    Bilateral inguinal hernia    CKD (chronic kidney disease)    Cognitive impairment    Diabetes mellitus without complication ([place])    Hyperlipidemia    Hypertension    PAD (peripheral artery disease) ([place])    Stroke ([place])    No past surgical history on file.   A IV Location/Drains/Wounds Patient Lines/Drains/Airways Status     Active Line/Drains/Airways     Name Placement date Placement time Site Days   Peripheral IV 11/12/22 20 G Left Antecubital 11/12/22  1840  Antecubital   less than 1            Intake/Output Last 24 hours  Intake/Output Summary (Last 24 hours) at 11/12/2022 2120 Last data filed at 11/12/2022 2057 Gross per 24 hour  Intake 100 ml  Output --  Net 100 ml    Labs/Imaging Results for orders placed or performed during the hospital encounter of 11/12/22 (from the past 48 hour(s))  Resp panel by RT-PCR (RSV, Flu A&B, Covid) Anterior Nasal Swab     Status: None   Collection Time: 11/12/22  6:13 PM   Specimen: Anterior Nasal Swab  Result Value Ref Range   SARS Coronavirus 2 by RT PCR NEGATIVE NEGATIVE   Influenza A by PCR NEGATIVE NEGATIVE   Influenza B by PCR NEGATIVE NEGATIVE    Comment: (NOTE) The Xpert Xpress SARS-CoV-2/FLU/RSV plus assay is intended as an aid in the diagnosis of influenza from Nasopharyngeal swab specimens and should not be used as a sole basis for treatment. Nasal washings and aspirates are unacceptable for Xpert Xpress SARS-CoV-2/FLU/RSV testing.  Fact Sheet for Patients: EntrepreneurPulse.com.au  Fact Sheet for Healthcare Providers: IncredibleEmployment.be  This test is not yet approved or cleared by the [place] FDA and has been authorized for detection and/or diagnosis of SARS-CoV-2 by FDA under an Emergency Use Authorization (EUA). This EUA will remain in effect (meaning this test can be used) for the duration of the COVID-19 declaration under Section 564(b)(1)  of the Act, 21 U.S.C. section 360bbb-3(b)(1), unless the authorization is terminated or revoked.     Resp Syncytial Virus by PCR NEGATIVE NEGATIVE    Comment: (NOTE) Fact Sheet for Patients: EntrepreneurPulse.com.au  Fact Sheet for Healthcare Providers: IncredibleEmployment.be  This test is not yet approved or cleared by the [place] FDA and has been authorized for detection and/or diagnosis of SARS-CoV-2 by FDA under an Emergency Use Authorization (EUA).  This EUA will remain in effect (meaning this test can be used) for the duration of the COVID-19 declaration under Section 564(b)(1) of the Act, 21 U.S.C. section 360bbb-3(b)(1), unless the authorization is terminated or revoked.  Performed at [facility], [place] [address], [place], [place] [id number]   Basic metabolic panel     Status: Abnormal   Collection Time: 11/12/22  6:35 PM  Result Value Ref Range   Sodium 140 135 - 145 mmol/L   Potassium 3.8 3.5 - 5.1 mmol/L   Chloride 107 98 - 111 mmol/L   CO2 25 22 - 32 mmol/L   Glucose, Bld 149 (H) 70 - 99 mg/dL    Comment: Glucose reference range applies only to samples taken after fasting for at least 8 hours.   BUN 27 (H) 8 - 23 mg/dL   Creatinine, Ser 1.61 (H) 0.61 - 1.24 mg/dL   Calcium 8.2 (L) 8.9 - 10.3 mg/dL   GFR, Estimated 40 (L) >60 mL/min    Comment: (NOTE) Calculated using the CKD-EPI Creatinine Equation (2021)    Anion gap 8 5 - 15    Comment: Performed at [facility] [address], [place], [place] [address]  CBC with Differential     Status: Abnormal   Collection Time: 11/12/22  6:35 PM  Result Value Ref Range   WBC 13.5 (H) 4.0 - 10.5 K/uL   RBC 2.84 (L) 4.22 - 5.81 MIL/uL   Hemoglobin 9.0 (L) 13.0 - 17.0 g/dL   HCT 28.6 (L) 39.0 - 52.0 %   MCV 100.7 (H) 80.0 - 100.0 fL   MCH 31.7 26.0 - 34.0 pg   MCHC 31.5 30.0 - 36.0 g/dL   RDW 14.2 11.5 - 15.5 %   Platelets 239 150 - 400 K/uL   nRBC 0.0 0.0 - 0.2 %   Neutrophils Relative % 81 %   Neutro Abs 10.8 (H) 1.7 - 7.7 K/uL   Lymphocytes Relative 13 %   Lymphs Abs 1.8 0.7 - 4.0 K/uL   Monocytes Relative 6 %   Monocytes Absolute 0.8 0.1 - 1.0 K/uL   Eosinophils Relative 0 %   Eosinophils Absolute 0.1 0.0 - 0.5 K/uL   Basophils Relative 0 %   Basophils Absolute 0.0 0.0 - 0.1 K/uL   Immature Granulocytes 0 %   Abs Immature Granulocytes 0.05 0.00 - 0.07 K/uL    Comment: Performed at [facility], [place] [address], [place], [place] [address]   Troponin I (High Sensitivity)     Status: Abnormal   Collection Time: 11/12/22  6:35 PM  Result Value Ref Range   Troponin I (High Sensitivity) 20 (H) <18 ng/L    Comment: (NOTE) Elevated high sensitivity troponin I (hsTnI) values and significant  changes across serial measurements may suggest ACS but many other  chronic and acute conditions are known to elevate hsTnI results.  Refer to the "Links" section for chest pain algorithms and additional  guidance. Performed at [facility], [place] [address], [place], [place] [address]  Ethanol     Status: None   Collection Time: 11/12/22  6:35 PM  Result Value Ref Range   Alcohol, Ethyl (B) <10 <10 mg/dL    Comment: (NOTE) Lowest detectable limit for serum alcohol is 10 mg/dL.  For medical purposes only. Performed at [facility], [place] [address] [place], [place] [address]    DG Chest Portable 1 View  Result Date: 11/12/2022 CLINICAL DATA:  Altered mental status, dizziness. EXAM: PORTABLE CHEST 1 VIEW COMPARISON:  07/27/2022 FINDINGS: New airspace opacity along the left hemidiaphragm, potentially in the left lower lobe and/or lingula, suspicious for pneumonia. The right lung appears clear. Cardiac and mediastinal margins appear normal. Atherosclerotic calcification of the aortic arch. Thoracic spondylosis. IMPRESSION: 1. New airspace opacity along the left hemidiaphragm, potentially in the left lower lobe and/or lingula, suspicious for pneumonia. Followup PA and lateral chest X-ray is recommended in 3-4 weeks following trial of antibiotic therapy to ensure resolution and exclude underlying malignancy. Electronically Signed   By: Van Clines M.D.   On: 11/12/2022 18:58    Pending Labs Unresulted Labs (From admission, onward)     Start     Ordered   11/12/22 1810  Rapid urine drug screen (hospital performed)  ONCE - STAT,   STAT        11/12/22 1809   11/12/22 1810  Urinalysis, Routine w reflex microscopic -Urine, Clean  Catch  Once,   URGENT       Question:  Specimen Source  Answer:  Urine, Clean Catch   11/12/22 1809            Vitals/Pain Today's Vitals   11/12/22 1810 11/12/22 1813 11/12/22 1822  BP:  (!) 147/80   Pulse:  90   Resp:  13   Temp:  97.8 F (36.6 C)   TempSrc:  Axillary   SpO2: 96% 97%   PainSc:   0-No pain    Isolation Precautions Contact precautions  Medications Medications  azithromycin (ZITHROMAX) tablet 500 mg (500 mg Oral Given 11/12/22 2046)  cefTRIAXone (ROCEPHIN) 1 g in sodium chloride 0.9 % 100 mL IVPB (0 g Intravenous Stopped 11/12/22 2057)    Mobility walks with device     Focused Assessments Neuro Assessment Handoff:  Swallow screen pass? Yes          Neuro Assessment: Exceptions to [facility] Neuro Checks:      Has TPA been given? No If patient is a Neuro Trauma and patient is going to OR before floor call report to [facility] nurse: [phone] or [phone]   R Recommendations: See Admitting Provider Note  Report given to:   Additional Notes:  Patient has history of dementia and Alzheimers. Lives with wife at home, family checks in on them. EMS found bed bugs on patient. Since being in the ED I have seen and removed 2 bugs (just a heads up). Patient has been sleeping a lot. Opens eyes to voice and follows commands. Patient has been calm, not been pulling at any lines/wires.

## 2022-11-12 NOTE — Assessment & Plan Note (Addendum)
Chronic.  -Delirium precautions

## 2022-11-12 NOTE — Assessment & Plan Note (Signed)
Reviewed VYNCA. DNR form reviewed.

## 2022-11-12 NOTE — ED Notes (Signed)
This RN walked into patient room, patient standing and has taken off all wires. Patient pulled IV out. [place]ens applied to patient. This RN hooked patient back up and attempted to start another IV. IV unsuccessful due to patient kicking.

## 2022-11-12 NOTE — Subjective & Objective (Signed)
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 No family is with patient in ER.  Pt found to have bed bug infestation. Placed on contact precautions. Pt unable to give Hx or ROS due to dementia  On arrival, temp 97.8, HR 90, BP 147/80, 97% RA sats  Labs:  WBC 13.5, Hgb 9.0 BUN 27, Scr 1.61  RSV, Influenza, Covid all negative CXR with LLL/ligular infiltrates  [facility] hospitalist contacted for admission.

## 2022-11-12 NOTE — Assessment & Plan Note (Signed)
Hx of recurrent syncope.

## 2022-11-12 NOTE — ED Provider Notes (Signed)
[facility] Provider Note   CSN: [id number] Arrival date & time: 11/12/22  1800     History  Chief Complaint  Patient presents with   Shane Dike Sr. is a 89 y.o. male with a history of dementia presenting from home with an episode of unresponsiveness.  Patient reportedly was witnessed to slump while sitting in a chair, possible LOC.  Unclear.  He was not verbally responding to EMS.  Pulses when they arrived.  Subsequently was awakened and speaking to them.  Patient reports he feels lightheaded.  He lives at home with his wife.  EMS reports that there was concern for bedbugs in the house and on the patient.  HPI     Home Medications Prior to Admission medications   Medication Sig Start Date End Date Taking? Authorizing Provider  amLODipine (NORVASC) 10 MG tablet Take 10 mg by mouth daily. 07/21/18   [provider]  aspirin 81 MG tablet Take 81 mg by mouth daily.    [provider]  atorvastatin (LIPITOR) 20 MG tablet Take 20 mg by mouth every evening. 06/20/18   [provider]      Allergies    Losartan potassium and Nsaids    Review of Systems   Review of Systems  Physical Exam Updated Vital Signs BP (!) 147/80 (BP Location: Right Arm)   Pulse 90   Temp 97.8 F (36.6 C) (Axillary)   Resp 13   SpO2 97%  Physical Exam Constitutional:      General: He is not in acute distress. HENT:     Head: Normocephalic and atraumatic.  Eyes:     Conjunctiva/sclera: Conjunctivae normal.     Pupils: Pupils are equal, round, and reactive to light.  Cardiovascular:     Rate and Rhythm: Normal rate and regular rhythm.  Pulmonary:     Effort: Pulmonary effort is normal. No respiratory distress.  Abdominal:     General: There is no distension.     Tenderness: There is no abdominal tenderness.  Skin:    General: Skin is warm and dry.  Neurological:     General: No focal deficit present.      Mental Status: He is alert. Mental status is at baseline.  Psychiatric:        Mood and Affect: Mood normal.        Behavior: Behavior normal.     ED Results / Procedures / Treatments   Labs (all labs ordered are listed, but only abnormal results are displayed) Labs Reviewed  BASIC METABOLIC PANEL - Abnormal; Notable for the following components:      Result Value   Glucose, Bld 149 (*)    BUN 27 (*)    Creatinine, Ser 1.61 (*)    Calcium 8.2 (*)    GFR, Estimated 40 (*)    All other components within normal limits  CBC WITH DIFFERENTIAL/PLATELET - Abnormal; Notable for the following components:   WBC 13.5 (*)    RBC 2.84 (*)    Hemoglobin 9.0 (*)    HCT 28.6 (*)    MCV 100.7 (*)    Neutro Abs 10.8 (*)    All other components within normal limits  URINALYSIS, ROUTINE W REFLEX MICROSCOPIC - Abnormal; Notable for the following components:   APPearance CLOUDY (*)    Hgb urine dipstick TRACE (*)    Nitrite POSITIVE (*)    Leukocytes,Ua MODERATE (*)  All other components within normal limits  URINALYSIS, MICROSCOPIC (REFLEX) - Abnormal; Notable for the following components:   Bacteria, UA MANY (*)    All other components within normal limits  TROPONIN I (HIGH SENSITIVITY) - Abnormal; Notable for the following components:   Troponin I (High Sensitivity) 20 (*)    All other components within normal limits  TROPONIN I (HIGH SENSITIVITY) - Abnormal; Notable for the following components:   Troponin I (High Sensitivity) 21 (*)    All other components within normal limits  RESP PANEL BY RT-PCR (RSV, FLU A&B, COVID)  [id number]  ETHANOL  RAPID URINE DRUG SCREEN, HOSP PERFORMED  HEMOGLOBIN A1C    EKG EKG Interpretation  Date/Time:  Monday November 12 2022 18:26:36 EST Ventricular Rate:  91 PR Interval:  130 QRS Duration: 91 QT Interval:  403 QTC Calculation: 496 R Axis:   -43 Text Interpretation: Sinus rhythm Multiform ventricular premature complexes Left axis deviation  Abnormal R-wave progression, early transition Borderline prolonged QT interval Confirmed by Octaviano Glow [phone]) on 11/12/2022 6:29:02 PM  Radiology DG Chest Portable 1 View  Result Date: 11/12/2022 CLINICAL DATA:  Altered mental status, dizziness. EXAM: PORTABLE CHEST 1 VIEW COMPARISON:  07/27/2022 FINDINGS: New airspace opacity along the left hemidiaphragm, potentially in the left lower lobe and/or lingula, suspicious for pneumonia. The right lung appears clear. Cardiac and mediastinal margins appear normal. Atherosclerotic calcification of the aortic arch. Thoracic spondylosis. IMPRESSION: 1. New airspace opacity along the left hemidiaphragm, potentially in the left lower lobe and/or lingula, suspicious for pneumonia. Followup PA and lateral chest X-ray is recommended in 3-4 weeks following trial of antibiotic therapy to ensure resolution and exclude underlying malignancy. Electronically Signed   By: Van Clines M.D.   On: 11/12/2022 18:58    Procedures Procedures    Medications Ordered in ED Medications  azithromycin (ZITHROMAX) tablet 500 mg (500 mg Oral Given 11/12/22 2046)  cefTRIAXone (ROCEPHIN) 1 g in sodium chloride 0.9 % 100 mL IVPB (0 g Intravenous Stopped 11/12/22 2057)    ED Course/ Medical Decision Making/ A&P Clinical Course as of 11/12/22 2158  Mon Nov 12, 2022  2056 Admitted to hospitalist [MT]    Clinical Course User Index [MT] Wyvonnia Dusky, MD                             Medical Decision Making Amount and/or Complexity of Data Reviewed Labs: ordered. Radiology: ordered. ECG/medicine tests: ordered.  Risk Prescription drug management. Decision regarding hospitalization.   This patient presents to the ED with concern for transient altered mental status. This involves an extensive number of treatment options, and is a complaint that carries with it a high risk of complications and morbidity.  The differential diagnosis includes infection versus  metabolic encephalopathy versus CVA versus arrhythmia versus other  On arrival the patient is awake and appears mentating at his baseline mental status according to his wife who was present.  I have a low suspicion for stroke.  Low suspicion for TIA as a cause of loss of consciousness or near syncope.  Do not believe needs emergent neuroimaging of the brain.  Co-morbidities that complicate the patient evaluation: Age, dementia  Additional history obtained from EMS  External records from outside source obtained and reviewed including echocardiogram most recently from November 2019 with an EF of 60 to 65%  I ordered and personally interpreted labs.  The pertinent results include: UA with nitrites and leukocytes,  white blood cell count elevated at 13.5.  I ordered imaging studies including x-ray of the chest I independently visualized and interpreted imaging which showed airspace opacity concerning for left-sided pneumonia I agree with the radiologist interpretation  The patient was maintained on a cardiac monitor.  I personally viewed and interpreted the cardiac monitored which showed an underlying rhythm of: Regular heart rate with occasional PVCs  Per my interpretation the patient's ECG shows no acute ischemic findings  I ordered medication including Rocephin and Romycin for community pneumonia  I have reviewed the patients home medicines and have made adjustments as needed   After the interventions noted above, I reevaluated the patient and found that they have: improved   Dispostion:  After consideration of the diagnostic results and the patients response to treatment, I feel that the patent would benefit from medical admission for observation overnight and management of community pneumonia and UTI.  Telemetry monitoring overnight.  Hospital team may consider echocardiogram for near syncope evaluation given that has been nearly 4 years since his last image.  The patient is stable on  room air at this time.  Patient is on contact precautions for bedbugs.  His wife is updated and made aware of the bedbug situation as well.         Final Clinical Impression(s) / ED Diagnoses Final diagnoses:  Near syncope  Pneumonia of left lung due to infectious organism, unspecified part of lung  Infestation by bed bug  Urinary tract infection without hematuria, site unspecified    Rx / DC Orders ED Discharge Orders     None         Wyvonnia Dusky, MD 11/12/22 2158

## 2022-11-12 NOTE — Assessment & Plan Note (Signed)
Not on any medications at home.  A1c of 6.5 -SSI as needed

## 2022-11-12 NOTE — H&P (Signed)
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 No family is with patient in ER.  Pt found to have bed bug infestation. Placed on contact precautions. Pt unable to give Hx or ROS due to dementia  On arrival, temp 97.8, HR 90, BP 147/80, 97% RA sats  Labs:  WBC 13.5, Hgb 9.0 BUN 27, Scr 1.61  RSV, Influenza, Covid all negative CXR with LLL/ligular infiltrates  [facility] hospitalist contacted for admission.    ED Course: CXR Lingular infiltrate, RA sats 97%. WBC 13.5  Review of Systems:  Review of Systems  Unable to perform ROS: Dementia    Past Medical History:  Diagnosis Date   B12 deficiency    Bilateral inguinal hernia    CKD (chronic kidney disease)    Cognitive impairment    Diabetes mellitus without complication (HCC)    Hyperlipidemia    Hypertension    PAD (peripheral artery disease) ([place])    Stroke ([place])     No past surgical history on file.   reports that he quit smoking about 55 years ago. His smoking use included cigarettes. He has never used smokeless tobacco. He reports that he does not drink alcohol and does not use drugs.  Allergies  Allergen Reactions   Losartan Potassium     Other reaction(s): made him sick   Nsaids     Other reaction(s): elevated kidney tests    Family History  Problem Relation Age of Onset   Diabetes Sister    Heart attack Sister     Prior to Admission medications   Medication Sig Start Date End Date Taking? Authorizing Provider  amLODipine (NORVASC) 10 MG tablet Take 10 mg by  mouth daily. 07/21/18   [provider]  aspirin 81 MG tablet Take 81 mg by mouth daily.    [provider]  atorvastatin (LIPITOR) 20 MG tablet Take 20 mg by mouth every evening. 06/20/18   [provider]    Physical Exam: Vitals:   11/12/22 1810 11/12/22 1813  BP:  (!) 147/80  Pulse:  90  Resp:  13  Temp:  97.8 F (36.6 C)  TempSrc:  Axillary  SpO2: 96% 97%    Physical Exam Vitals and nursing note reviewed.  Constitutional:      General: He is not in acute distress.    Comments: Elderly AAM. Cachetic appearing  HENT:     Head: Normocephalic.     Nose: Nose normal.  Cardiovascular:     Rate and Rhythm: Normal rate and regular rhythm.     Pulses: Normal pulses.  Pulmonary:     Effort: Pulmonary effort is normal. No respiratory distress.  Abdominal:     General: Abdomen is flat. Bowel sounds are normal. There is no distension.     Tenderness: There is no abdominal tenderness.  Musculoskeletal:     Right lower leg: Edema present.     Left lower leg: Edema present.     Comments: +2 pitting pedal edema bilaterally +1-2 pitting pretibial edema  bilaterally  Skin:    General: Skin is warm and dry.     Capillary Refill: Capillary refill takes less than 2 seconds.  Neurological:     Mental Status: He is disoriented.      Labs on Admission: I have personally reviewed following labs and imaging studies  CBC: Recent Labs  Lab 11/12/22 1835  WBC 13.5*  NEUTROABS 10.8*  HGB 9.0*  HCT 28.6*  MCV 100.7*  PLT [id number]   Basic Metabolic Panel: Recent Labs  Lab 11/12/22 1835  NA 140  K 3.8  CL 107  CO2 25  GLUCOSE 149*  BUN 27*  CREATININE 1.61*  CALCIUM 8.2*   GFR: CrCl cannot be calculated (Unknown ideal weight.). Liver Function Tests: No results for input(s): "AST", "ALT", "ALKPHOS", "BILITOT", "PROT", "ALBUMIN" in the last 168 hours. No results for input(s): "LIPASE", "AMYLASE" in the last 168 hours. No results for input(s):  "AMMONIA" in the last 168 hours. Coagulation Profile: No results for input(s): "INR", "PROTIME" in the last 168 hours. Cardiac Enzymes: Recent Labs  Lab 11/12/22 1835  TROPONINIHS 20*   BNP (last 3 results) No results for input(s): "PROBNP" in the last 8760 hours. HbA1C: No results for input(s): "HGBA1C" in the last 72 hours. CBG: No results for input(s): "GLUCAP" in the last 168 hours. Lipid Profile: No results for input(s): "CHOL", "HDL", "LDLCALC", "TRIG", "CHOLHDL", "LDLDIRECT" in the last 72 hours. Thyroid Function Tests: No results for input(s): "TSH", "T4TOTAL", "FREET4", "T3FREE", "THYROIDAB" in the last 72 hours. Anemia Panel: No results for input(s): "VITAMINB12", "FOLATE", "FERRITIN", "TIBC", "IRON", "RETICCTPCT" in the last 72 hours. Urine analysis:    Component Value Date/Time   COLORURINE YELLOW 08/10/2021 [place] 08/10/2021 1321   LABSPEC 1.014 08/10/2021 1321   PHURINE 6.0 08/10/2021 1321   GLUCOSEU NEGATIVE 08/10/2021 1321   [place] 08/10/2021 1321   [place] 08/10/2021 1321   KETONESUR NEGATIVE 08/10/2021 1321   PROTEINUR NEGATIVE 08/10/2021 1321   UROBILINOGEN 0.2 01/06/2012 1335   NITRITE NEGATIVE 08/10/2021 1321   LEUKOCYTESUR NEGATIVE 08/10/2021 1321    Radiological Exams on Admission: I have personally reviewed images DG Chest Portable 1 View  Result Date: 11/12/2022 CLINICAL DATA:  Altered mental status, dizziness. EXAM: PORTABLE CHEST 1 VIEW COMPARISON:  07/27/2022 FINDINGS: New airspace opacity along the left hemidiaphragm, potentially in the left lower lobe and/or lingula, suspicious for pneumonia. The right lung appears clear. Cardiac and mediastinal margins appear normal. Atherosclerotic calcification of the aortic arch. Thoracic spondylosis. IMPRESSION: 1. New airspace opacity along the left hemidiaphragm, potentially in the left lower lobe and/or lingula, suspicious for pneumonia. Followup PA and lateral  chest X-ray is recommended in 3-4 weeks following trial of antibiotic therapy to ensure resolution and exclude underlying malignancy. Electronically Signed   By: Van Clines M.D.   On: 11/12/2022 18:58    EKG: My personal interpretation of EKG shows: NSR    Assessment/Plan Principal Problem:   LLL pneumonia Active Problems:   Syncope   Acute renal failure superimposed on stage 3a chronic kidney disease (HCC) - baseline SCr 1.2-1.4   Infestation by bed bug   HTN (hypertension)   Dementia without behavioral disturbance (HCC)   Type 2 diabetes mellitus with diabetic peripheral angiopathy without gangrene, without long-term current use of insulin ([place])   DNR (do not resuscitate)    Assessment and Plan: * LLL pneumonia Observation med/surg bed. IV rocephin/zithromax. Prn duonebs. Send legionella/strep antigen. Obtain baseline procalcitonin. Repeat CBC in AM.  Infestation by bed bug Pt with bedbugs. I observed the collected samples at bedside. Confirmed that these are bed bugs. Will need CM and possible APS.  Acute renal failure superimposed on stage 3a chronic kidney disease (HCC) - baseline SCr 1.2-1.4 Mild AKI. Baseline Scr 1.2-1.4. start gentle IVF overnight. Repeat BMP in AM.  Syncope Hx of recurrent syncope.  DNR (do not resuscitate) Reviewed [facility]. DNR form reviewed.    Type 2 diabetes mellitus with diabetic peripheral angiopathy without gangrene, without long-term current use of insulin (HCC) Check A1c. Add SSI.  Dementia without behavioral disturbance (HCC) Chronic. Given his bed bug infestation, pt may need alternate living arrangements +/- APS investigation. CM consult.  HTN (hypertension) Stable.   DVT prophylaxis: SCDs Code Status: DNR/DNI(Do NOT Intubate). Verified with [facility] Family Communication: no family at bedside  Disposition Plan: return home  Consults called: none  Admission status: Observation, Med-Surg   Kristopher Oppenheim, DO [facility] 11/12/2022, 9:24 PM

## 2022-11-12 NOTE — Assessment & Plan Note (Addendum)
Mild AKI. Baseline Scr 1.2-1.4. start gentle IVF overnight. Repeat BMP in AM.

## 2022-11-12 NOTE — Assessment & Plan Note (Signed)
Stable. 

## 2022-11-12 NOTE — Assessment & Plan Note (Signed)
Pt with bedbugs. I observed the collected samples at bedside. Confirmed that these are bed bugs. Will need CM and possible APS.

## 2022-11-12 NOTE — Assessment & Plan Note (Signed)
Patient with leukocytosis and concern of aspiration per son. Swallow evaluation was obtained and they were recommending dysphagia 3 diet with thin liquid.  Chest x-ray with small left lower lobe infiltrate and procalcitonin elevated at 3.96.  Remained on room air. -Continue with ceftriaxone and Zithromax -Continue with supportive care

## 2022-11-12 NOTE — ED Notes (Signed)
This RN attempted to help patient urinate in urinal. Patient states unable to urinate at this time.

## 2022-11-13 ENCOUNTER — Other Ambulatory Visit: Payer: Self-pay

## 2022-11-13 ENCOUNTER — Encounter (HOSPITAL_COMMUNITY): Payer: Self-pay | Admitting: Internal Medicine

## 2022-11-13 ENCOUNTER — Inpatient Hospital Stay (HOSPITAL_COMMUNITY): Payer: Medicare Other

## 2022-11-13 DIAGNOSIS — R42 Dizziness and giddiness: Secondary | ICD-10-CM | POA: Diagnosis present

## 2022-11-13 DIAGNOSIS — E1151 Type 2 diabetes mellitus with diabetic peripheral angiopathy without gangrene: Secondary | ICD-10-CM | POA: Diagnosis present

## 2022-11-13 DIAGNOSIS — G9341 Metabolic encephalopathy: Secondary | ICD-10-CM | POA: Diagnosis present

## 2022-11-13 DIAGNOSIS — Z1152 Encounter for screening for COVID-19: Secondary | ICD-10-CM | POA: Diagnosis not present

## 2022-11-13 DIAGNOSIS — Z66 Do not resuscitate: Secondary | ICD-10-CM | POA: Diagnosis present

## 2022-11-13 DIAGNOSIS — I129 Hypertensive chronic kidney disease with stage 1 through stage 4 chronic kidney disease, or unspecified chronic kidney disease: Secondary | ICD-10-CM | POA: Diagnosis present

## 2022-11-13 DIAGNOSIS — J690 Pneumonitis due to inhalation of food and vomit: Secondary | ICD-10-CM | POA: Diagnosis present

## 2022-11-13 DIAGNOSIS — B888 Other specified infestations: Secondary | ICD-10-CM | POA: Diagnosis not present

## 2022-11-13 DIAGNOSIS — Z888 Allergy status to other drugs, medicaments and biological substances status: Secondary | ICD-10-CM | POA: Diagnosis not present

## 2022-11-13 DIAGNOSIS — S0990XA Unspecified injury of head, initial encounter: Secondary | ICD-10-CM | POA: Diagnosis not present

## 2022-11-13 DIAGNOSIS — F0390 Unspecified dementia without behavioral disturbance: Secondary | ICD-10-CM | POA: Diagnosis present

## 2022-11-13 DIAGNOSIS — N1831 Chronic kidney disease, stage 3a: Secondary | ICD-10-CM | POA: Diagnosis present

## 2022-11-13 DIAGNOSIS — W19XXXA Unspecified fall, initial encounter: Secondary | ICD-10-CM

## 2022-11-13 DIAGNOSIS — Z87891 Personal history of nicotine dependence: Secondary | ICD-10-CM | POA: Diagnosis not present

## 2022-11-13 DIAGNOSIS — J189 Pneumonia, unspecified organism: Secondary | ICD-10-CM | POA: Diagnosis not present

## 2022-11-13 DIAGNOSIS — N390 Urinary tract infection, site not specified: Secondary | ICD-10-CM | POA: Diagnosis present

## 2022-11-13 DIAGNOSIS — Z886 Allergy status to analgesic agent status: Secondary | ICD-10-CM | POA: Diagnosis not present

## 2022-11-13 DIAGNOSIS — E1142 Type 2 diabetes mellitus with diabetic polyneuropathy: Secondary | ICD-10-CM | POA: Diagnosis present

## 2022-11-13 DIAGNOSIS — E785 Hyperlipidemia, unspecified: Secondary | ICD-10-CM | POA: Diagnosis present

## 2022-11-13 DIAGNOSIS — Z8673 Personal history of transient ischemic attack (TIA), and cerebral infarction without residual deficits: Secondary | ICD-10-CM | POA: Diagnosis not present

## 2022-11-13 DIAGNOSIS — N179 Acute kidney failure, unspecified: Secondary | ICD-10-CM | POA: Diagnosis present

## 2022-11-13 DIAGNOSIS — D631 Anemia in chronic kidney disease: Secondary | ICD-10-CM | POA: Diagnosis present

## 2022-11-13 DIAGNOSIS — Z79899 Other long term (current) drug therapy: Secondary | ICD-10-CM | POA: Diagnosis not present

## 2022-11-13 DIAGNOSIS — G9389 Other specified disorders of brain: Secondary | ICD-10-CM | POA: Diagnosis not present

## 2022-11-13 DIAGNOSIS — R55 Syncope and collapse: Secondary | ICD-10-CM | POA: Diagnosis not present

## 2022-11-13 DIAGNOSIS — Z7982 Long term (current) use of aspirin: Secondary | ICD-10-CM | POA: Diagnosis not present

## 2022-11-13 DIAGNOSIS — E669 Obesity, unspecified: Secondary | ICD-10-CM | POA: Diagnosis present

## 2022-11-13 DIAGNOSIS — E1122 Type 2 diabetes mellitus with diabetic chronic kidney disease: Secondary | ICD-10-CM | POA: Diagnosis present

## 2022-11-13 DIAGNOSIS — B9689 Other specified bacterial agents as the cause of diseases classified elsewhere: Secondary | ICD-10-CM | POA: Diagnosis present

## 2022-11-13 DIAGNOSIS — I69393 Ataxia following cerebral infarction: Secondary | ICD-10-CM | POA: Diagnosis not present

## 2022-11-13 LAB — GLUCOSE, CAPILLARY
Glucose-Capillary: 106 mg/dL — ABNORMAL HIGH (ref 70–99)
Glucose-Capillary: 113 mg/dL — ABNORMAL HIGH (ref 70–99)
Glucose-Capillary: 113 mg/dL — ABNORMAL HIGH (ref 70–99)
Glucose-Capillary: 95 mg/dL (ref 70–99)

## 2022-11-13 LAB — CBC WITH DIFFERENTIAL/PLATELET
Abs Immature Granulocytes: 0.06 10*3/uL (ref 0.00–0.07)
Basophils Absolute: 0 10*3/uL (ref 0.0–0.1)
Basophils Relative: 0 %
Eosinophils Absolute: 0 10*3/uL (ref 0.0–0.5)
Eosinophils Relative: 0 %
HCT: 27.1 % — ABNORMAL LOW (ref 39.0–52.0)
Hemoglobin: 8.6 g/dL — ABNORMAL LOW (ref 13.0–17.0)
Immature Granulocytes: 1 %
Lymphocytes Relative: 15 %
Lymphs Abs: 1.4 10*3/uL (ref 0.7–4.0)
MCH: 31.4 pg (ref 26.0–34.0)
MCHC: 31.7 g/dL (ref 30.0–36.0)
MCV: 98.9 fL (ref 80.0–100.0)
Monocytes Absolute: 0.7 10*3/uL (ref 0.1–1.0)
Monocytes Relative: 7 %
Neutro Abs: 7 10*3/uL (ref 1.7–7.7)
Neutrophils Relative %: 77 %
Platelets: 244 10*3/uL (ref 150–400)
RBC: 2.74 MIL/uL — ABNORMAL LOW (ref 4.22–5.81)
RDW: 14.2 % (ref 11.5–15.5)
WBC: 9.1 10*3/uL (ref 4.0–10.5)
nRBC: 0 % (ref 0.0–0.2)

## 2022-11-13 LAB — COMPREHENSIVE METABOLIC PANEL
ALT: 16 U/L (ref 0–44)
AST: 18 U/L (ref 15–41)
Albumin: 2.8 g/dL — ABNORMAL LOW (ref 3.5–5.0)
Alkaline Phosphatase: 81 U/L (ref 38–126)
Anion gap: 10 (ref 5–15)
BUN: 24 mg/dL — ABNORMAL HIGH (ref 8–23)
CO2: 24 mmol/L (ref 22–32)
Calcium: 8.3 mg/dL — ABNORMAL LOW (ref 8.9–10.3)
Chloride: 106 mmol/L (ref 98–111)
Creatinine, Ser: 1.57 mg/dL — ABNORMAL HIGH (ref 0.61–1.24)
GFR, Estimated: 41 mL/min — ABNORMAL LOW (ref >60)
Glucose, Bld: 128 mg/dL — ABNORMAL HIGH (ref 70–99)
Potassium: 3.6 mmol/L (ref 3.5–5.1)
Sodium: 140 mmol/L (ref 135–145)
Total Bilirubin: 0.6 mg/dL (ref 0.3–1.2)
Total Protein: 6.2 g/dL — ABNORMAL LOW (ref 6.5–8.1)

## 2022-11-13 LAB — HEMOGLOBIN A1C
Hgb A1c MFr Bld: 6.5 % — ABNORMAL HIGH (ref 4.8–5.6)
Mean Plasma Glucose: 139.85 mg/dL

## 2022-11-13 LAB — BRAIN NATRIURETIC PEPTIDE: B Natriuretic Peptide: 87.6 pg/mL (ref 0.0–100.0)

## 2022-11-13 LAB — MAGNESIUM: Magnesium: 2.4 mg/dL (ref 1.7–2.4)

## 2022-11-13 LAB — PROCALCITONIN: Procalcitonin: 3.96 ng/mL

## 2022-11-13 LAB — STREP PNEUMONIAE URINARY ANTIGEN: Strep Pneumo Urinary Antigen: NEGATIVE

## 2022-11-13 MED ORDER — HALOPERIDOL LACTATE 5 MG/ML IJ SOLN: 2.0000 mg | Freq: Four times a day (QID) | INTRAMUSCULAR | Status: DC | PRN

## 2022-11-13 MED ORDER — QUETIAPINE FUMARATE 25 MG PO TABS
25.0000 mg | ORAL_TABLET | Freq: Every day | ORAL | Status: DC
  Administered 2022-11-13 – 2022-11-21 (×9): 25 mg via ORAL
  Filled 2022-11-13 (×9): qty 1

## 2022-11-13 MED ORDER — HALOPERIDOL LACTATE 5 MG/ML IJ SOLN
2.0000 mg | Freq: Four times a day (QID) | INTRAMUSCULAR | Status: DC | PRN
  Administered 2022-11-14 – 2022-11-15 (×3): 2 mg via INTRAMUSCULAR
  Filled 2022-11-13 (×3): qty 1

## 2022-11-13 NOTE — Progress Notes (Signed)
PT Cancellation Note  Patient Details Name: Shane DORSA Sr. MRN: [id number] DOB: 03/28/1930   Cancelled Treatment:    Reason Eval/Treat Not Completed: Patient declined, no reason specified  Patient sleeping on arrival. Arousable. Cannot/will not state his name. Speech very garbled and unintelligible. Provided one step commands with pt nodding his head yes, but with no effort to complete task. When asked if I could help him, nods his head no. Attempts at PROM met with strong resistance. Attempted to reposition in the bed with strong resistance. Nurse tech reports wife was here earlier today and he does better with her here. Will try to see 2/21 a.m.    [place]  Office [phone]  Rexanne Mano 11/13/2022, 4:07 PM

## 2022-11-13 NOTE — Evaluation (Signed)
Clinical/Bedside Swallow Evaluation Patient Details  Name: Shane Juarez. MRN: [id number] Date of Birth: 07/05/1930  Today's Date: 11/13/2022 Time: SLP Start Time (ACUTE ONLY): 1254 SLP Stop Time (ACUTE ONLY): 1304 SLP Time Calculation (min) (ACUTE ONLY): 10 min  Past Medical History:  Past Medical History:  Diagnosis Date   B12 deficiency    Bilateral inguinal hernia    CKD (chronic kidney disease)    Cognitive impairment    Diabetes mellitus without complication (HCC)    Hyperlipidemia    Hypertension    PAD (peripheral artery disease) (HCC)    Stroke [facility])    Past Surgical History: No past surgical history on file. 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 Pt found to have bed bug infestation. CXR with LLL/lingular infiltrates    Assessment / Plan / Recommendation  Clinical Impression  Pt's swallow function appeared within functional limits across textures. He is missing mandibular dentition and has several maxillary teeth. Wife states he uses partials at home when eating however they are not present in hospital. Despite not having dentures he was able to masticate cracker timely without residue however given dementia and current illness, wife in agreement with downgrading solids to Dys 3 while he is in the hospital. There were no signs of aspiration with any consistency and pharyngeal swallow appeared timely from informal observation. Recommend continue thin liquids, pills whole in puree. Dys 3 appropriate for hospital stay and no further ST needs at present and will sign off at this time. SLP Visit Diagnosis: Dysphagia, unspecified (R13.10)    Aspiration Risk  Mild aspiration risk    Diet Recommendation Dysphagia 3 (Mech soft);Thin liquid   Liquid Administration via: Cup;Straw Medication Administration: Whole meds with puree Supervision: Staff to assist with  self feeding;Full supervision/cueing for compensatory strategies Compensations: Slow rate;Small sips/bites;Minimize environmental distractions Postural Changes: Seated upright at 90 degrees    Other  Recommendations Oral Care Recommendations: Oral care BID    Recommendations for follow up therapy are one component of a multi-disciplinary discharge planning process, led by the attending physician.  Recommendations may be updated based on patient status, additional functional criteria and insurance authorization.  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 Pt found to have bed bug infestation. CXR with LLL/lingular infiltrates Type of Study: Bedside Swallow Evaluation Previous Swallow Assessment:  (pt seen 11 years ago) Diet Prior to this Study: Regular;Thin liquids (Level 0) Temperature Spikes Noted: No Respiratory Status: Room air History of Recent Intubation: No Behavior/Cognition: Cooperative;Pleasant mood;Alert Oral Cavity Assessment: Within Functional Limits Oral Care Completed by SLP: No Oral Cavity - Dentition: Missing dentition (missing lower and has several maxillary dentition, wears partials that are at home) Vision: Functional for self-feeding Patient Positioning: Upright in bed Baseline Vocal Quality: Normal    Oral/Motor/Sensory Function Overall Oral Motor/Sensory Function: Within functional limits  Ice Chips Ice chips: Not tested   Thin Liquid Thin Liquid: Within functional limits Presentation: Straw    Nectar Thick Nectar Thick Liquid: Not tested   Honey Thick Honey  Thick Liquid: Not tested   Puree Puree: Within functional limits   Solid     Solid: Within functional limits      Mick Sell Orbie Pyo 11/13/2022,1:24 PM

## 2022-11-13 NOTE — Assessment & Plan Note (Signed)
UA concerning for UTI.  Urine culture ordered as add-on. -Patient is already on ceftriaxone for concern of pneumonia which can also cover for UTI. -Follow-up urine cultures

## 2022-11-13 NOTE — Assessment & Plan Note (Signed)
Patient had a fall with syncopal episode at home.  Family was concerned about right eye swelling, eyeball feels little more prominent. -Ordered CT head

## 2022-11-13 NOTE — Progress Notes (Signed)
Patient son Shane Juarez brought in medication that patient takes at home.   For agitation, patient takes quetiapine fumarate 50 mg, 1 tab, q12 hrs, one in the morning and one at night

## 2022-11-13 NOTE — TOC Progression Note (Addendum)
Transition of Care (TOC) - Progression Note    Patient Details  Name: Shane HABERBERGER Sr. MRN: [id number] Date of Birth: 12-03-1929  Transition of Care [facility]) CM/SW Contact  Jinger Neighbors, [place] Phone Number: 11/13/2022, 10:47 AM  Clinical Narrative:     Received [facility] consult for APS report regarding bed bugs; however, this does not warrant an APS report and there are not resources for bed bugs at this time. CSW will reach out to the family to discuss concerns and also products that may be purchased over the counter.   CSW spoke with Shane Juarez, Shane Juarez, pt's son to discuss bed bugs. Mr. Sill reports they are aware and have a professional going out to the home today to spray. He further stated he will be taking a few days off to launder all of the linens. CSW made hospitalist aware via secure chat.       Expected Discharge Plan and Services                                               Social Determinants of Health (SDOH) Interventions SDOH Screenings   Tobacco Use: Medium Risk (11/13/2022)    Readmission Risk Interventions     No data to display

## 2022-11-13 NOTE — Progress Notes (Signed)
Progress Note   Patient: Shane Juarez [id number] DOB: 1930-03-22 DOA: 11/12/2022     0 DOS: the patient was seen and examined on 11/13/2022   Brief hospital course: Taken from H&P.  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 No family is with patient in ER. Pt found to have bed bug infestation.   Apparently patient was becoming progressively weak for the past few days.  There was an increased urinary frequency and he was becoming more incontinent per son.  Son was also concerned that he was coughing while drinking water for the past 1 week.  Appetite remains normal.  At baseline he was able to walk around with the help of walker and lives with his wife. No other recent illnesses.  No fever or chills.  No sick contacts.  On arrival to ED he was hemodynamically stable, afebrile and saturating well on room air. Labs pertinent for WBC 13.5, hemoglobin 9, BUN 27, creatinine 1.61, respiratory panel negative for RSV, influenza and COVID. Chest x-ray concerning for 4 LLL/lingular infiltrate. EKG with normal sinus rhythm.  2/20: Overnight becoming combative and pulling on the vitals including IV.  Vitals remained stable and patient remained afebrile.  CBC with resolution of leukocytosis, hemoglobin decreased to 8.6, renal function with some improvement, BUN 24 and creatinine 1.57, A1c 6.5, troponin barely positive with a flat curve UA with pyuria, bacteriuria and positive nitrite, sending urine culture as add-on. Procalcitonin elevated at 3.96 and BNP normal at 87.6. Swallow evaluation was obtained with family concern of aspiration, son he was recently coughing while drinking water, they recommended dysphagia 3 diet with thin liquid.  Social worker discussed with family regarding bug infestation before making any APS complaint, patient lives with wife, son is  taking some time off to disinfect and clean the house.  Patient with right eye feels little edematous and more prominent than left, son and wife was concerned that this is new.  No vision changes per patient.  Ordered CT head   Assessment and Plan: * LLL pneumonia Patient with leukocytosis and concern of aspiration per son. Swallow evaluation was obtained and they were recommending dysphagia 3 diet with thin liquid.  Chest x-ray with small left lower lobe infiltrate and procalcitonin elevated at 3.96.  Remained on room air. -Continue with ceftriaxone and Zithromax -Continue with supportive care   Fall at home, initial encounter Patient had a fall with syncopal episode at home.  Family was concerned about right eye swelling, eyeball feels little more prominent. -Ordered CT head  UTI (urinary tract infection) UA concerning for UTI.  Urine culture ordered as add-on. -Patient is already on ceftriaxone for concern of pneumonia which can also cover for UTI. -Follow-up urine cultures  Infestation by bed bug Pt with bedbugs. I observed the collected samples at bedside. Confirmed that these are bed bugs.  Social worker talked with family before contacting APS and they promised to take care of that infestation.  Son is taking some time off to clean the house and they were also involving termite control.  Acute renal failure superimposed on stage 3a chronic kidney disease (HCC) - baseline SCr 1.2-1.4 Mild AKI. Baseline Scr 1.2-1.4.  Patient received some gentle fluid. Renal function with some improvement. -Continue with gentle hydration -Monitor renal function  Syncope Hx of recurrent syncope.  BNP normal  with stable vitals. Recent progressive weakness which can be due to UTI and concern of pneumonia. -PT/OT evaluation ordered  DNR (do not resuscitate) Reviewed [facility]CA. DNR form reviewed.    Type 2 diabetes mellitus with diabetic peripheral angiopathy without gangrene, without long-term  current use of insulin (HCC) Not on any medications at home.  A1c of 6.5 -SSI as needed  Dementia without behavioral disturbance (HCC) Chronic.  -Delirium precautions  HTN (hypertension) Blood pressure currently within goal. -Holding home lisinopril because of AKI -Continue with amlodipine   Subjective: Patient was seen and examined today.  Son and wife at bedside.  Appears lethargic and denies any vision changes as family was concerned about right eye becoming more prominent and edematous.  Physical Exam: Vitals:   11/13/22 0049 11/13/22 0430 11/13/22 0719 11/13/22 0746  BP: 117/66 (!) 111/50 127/63 (!) 129/47  Pulse: 91 82 86 90  Resp:  18  18  Temp:  98.1 F (36.7 C) 98.4 F (36.9 C) 97.7 F (36.5 C)  TempSrc:   Oral Oral  SpO2: 93% 100% 100% 95%   General.  Frail gentleman, in no acute distress. Pulmonary.  Few rhonchi at left base, normal respiratory effort. CV.  Regular rate and rhythm, no JVD, rub or murmur. Abdomen.  Soft, nontender, nondistended, BS positive. CNS.  Alert and oriented .  No focal neurologic deficit. Extremities.  No edema, no cyanosis, pulses intact and symmetrical. Psychiatry.  Appears to have cognitive impairment  Data Reviewed: Prior data reviewed  Family Communication: Discussed with son and wife at bedside  Disposition: Status is: Inpatient Remains inpatient appropriate because: Severity of illness  Planned Discharge Destination:  To be determined  Time spent: 50 minutes  This record has been created using Systems analyst. Errors have been sought and corrected,but may not always be located. Such creation errors do not reflect on the standard of care.   Author: Lorella Nimrod, MD 11/13/2022 2:25 PM  For on call review [email or web address].

## 2022-11-13 NOTE — Hospital Course (Addendum)
Taken from H&P.  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 No family is with patient in ER. Pt found to have bed bug infestation.   Apparently patient was becoming progressively weak for the past few days.  There was an increased urinary frequency and he was becoming more incontinent per son.  Son was also concerned that he was coughing while drinking water for the past 1 week.  Appetite remains normal.  At baseline he was able to walk around with the help of walker and lives with his wife. No other recent illnesses.  No fever or chills.  No sick contacts.  On arrival to ED he was hemodynamically stable, afebrile and saturating well on room air. Labs pertinent for WBC 13.5, hemoglobin 9, BUN 27, creatinine 1.61, respiratory panel negative for RSV, influenza and COVID. Chest x-ray concerning for 4 LLL/lingular infiltrate. EKG with normal sinus rhythm.  2/20: Overnight becoming combative and pulling on the vitals including IV.  Vitals remained stable and patient remained afebrile.  CBC with resolution of leukocytosis, hemoglobin decreased to 8.6, renal function with some improvement, BUN 24 and creatinine 1.57, A1c 6.5, troponin barely positive with a flat curve UA with pyuria, bacteriuria and positive nitrite, sending urine culture as add-on. Procalcitonin elevated at 3.96 and BNP normal at 87.6. Swallow evaluation was obtained with family concern of aspiration, son he was recently coughing while drinking water, they recommended dysphagia 3 diet with thin liquid.  Social worker discussed with family regarding bug infestation before making any APS complaint, patient lives with wife, son is taking some time off to disinfect and clean the house.  Patient with right eye feels little edematous and more prominent than left, son and wife was concerned that this is new.   No vision changes per patient.  Ordered CT head

## 2022-11-14 DIAGNOSIS — B888 Other specified infestations: Secondary | ICD-10-CM | POA: Diagnosis not present

## 2022-11-14 DIAGNOSIS — J189 Pneumonia, unspecified organism: Secondary | ICD-10-CM | POA: Diagnosis not present

## 2022-11-14 DIAGNOSIS — R55 Syncope and collapse: Secondary | ICD-10-CM | POA: Diagnosis not present

## 2022-11-14 LAB — BASIC METABOLIC PANEL WITH GFR
Anion gap: 9 (ref 5–15)
BUN: 17 mg/dL (ref 8–23)
CO2: 23 mmol/L (ref 22–32)
Calcium: 8.5 mg/dL — ABNORMAL LOW (ref 8.9–10.3)
Chloride: 104 mmol/L (ref 98–111)
Creatinine, Ser: 1.42 mg/dL — ABNORMAL HIGH (ref 0.61–1.24)
GFR, Estimated: 47 mL/min — ABNORMAL LOW
Glucose, Bld: 169 mg/dL — ABNORMAL HIGH (ref 70–99)
Potassium: 4.4 mmol/L (ref 3.5–5.1)
Sodium: 136 mmol/L (ref 135–145)

## 2022-11-14 LAB — LEGIONELLA PNEUMOPHILA SEROGP 1 UR AG: L. pneumophila Serogp 1 Ur Ag: NEGATIVE

## 2022-11-14 LAB — GLUCOSE, CAPILLARY
Glucose-Capillary: 87 mg/dL (ref 70–99)
Glucose-Capillary: 95 mg/dL (ref 70–99)
Glucose-Capillary: 151 mg/dL — ABNORMAL HIGH (ref 70–99)
Glucose-Capillary: 145 mg/dL — ABNORMAL HIGH (ref 70–99)

## 2022-11-14 LAB — CBC
HCT: 29.3 % — ABNORMAL LOW (ref 39.0–52.0)
Hemoglobin: 9.6 g/dL — ABNORMAL LOW (ref 13.0–17.0)
MCH: 31.8 pg (ref 26.0–34.0)
MCHC: 32.8 g/dL (ref 30.0–36.0)
MCV: 97 fL (ref 80.0–100.0)
Platelets: 174 10*3/uL (ref 150–400)
RBC: 3.02 MIL/uL — ABNORMAL LOW (ref 4.22–5.81)
RDW: 13.7 % (ref 11.5–15.5)
WBC: 7.5 10*3/uL (ref 4.0–10.5)
nRBC: 0 % (ref 0.0–0.2)

## 2022-11-14 MED ORDER — LACTATED RINGERS IV SOLN: INTRAVENOUS | Status: DC

## 2022-11-14 MED ORDER — ENSURE ENLIVE PO LIQD
237.0000 mL | Freq: Two times a day (BID) | ORAL | Status: DC
  Administered 2022-11-14 – 2022-11-22 (×14): 237 mL via ORAL

## 2022-11-14 MED ORDER — RENA-VITE PO TABS
1.0000 | ORAL_TABLET | Freq: Every day | ORAL | Status: DC
  Administered 2022-11-14 – 2022-11-21 (×7): 1 via ORAL
  Filled 2022-11-14 (×8): qty 1

## 2022-11-14 NOTE — Progress Notes (Signed)
PT Cancellation Note  Patient Details Name: Shane LAROWE Sr. MRN: [id number] DOB: 09-23-1930   Cancelled Treatment:    Reason Eval/Treat Not Completed: Other (comment) PT orders received, chart reviewed. Nurse reports NT recently attempted to obtain vitals from pt & pt swinging/hitting at her. Pt currently resting soundly in bed, no family in room. Will allow pt to rest & re-attempt when family is present (per previous note, pt may be more agreeable when wife is present).   Lavone Nian, PT, DPT 11/14/22, 9:12 AM   [name] 11/14/2022, 9:11 AM

## 2022-11-14 NOTE — Evaluation (Signed)
Occupational Therapy Evaluation Patient Details Name: Shane COTUGNO Sr. MRN: [id number] DOB: 01-20-1930 Today's Date: 11/14/2022   History of Present Illness 89 yo AAM who presents to ER 11/12/22  after syncope and fall at home. +pneumonia; acute renal failure; Pt found to have bed bug infestation. PMHx: CVA, dementia, type 2 DM, recurrent syncope, CKD stage 3a(baseline Scr 1.2-1.4), ataxia   Clinical Impression   Shane Juarez was evaluated s/p the above admission list. He presents with hx of dementia and impaired cognation therefore PLOF and home set up were gleaned from last admission, pt oriented to his name only at the time of eval. Upon evaluation he was limited by impaired cognition, weakness, decreased activity tolerance and unsteady balance . Overall he required mod A and multimodal cues for bed mobility,mod A for simple transfers and up to max A for LB ADLs . Pt will benefit from continued acute OT services. Recommend d/c to SNF.    Recommendations for follow up therapy are one component of a multi-disciplinary discharge planning process, led by the attending physician.  Recommendations may be updated based on patient status, additional functional criteria and insurance authorization.   Follow Up Recommendations  Skilled nursing-short term rehab (<3 hours/day)     Assistance Recommended at Discharge Frequent or constant Supervision/Assistance  Patient can return home with the following A lot of help with walking and/or transfers;A lot of help with bathing/dressing/bathroom;Assistance with cooking/housework;Direct supervision/assist for medications management;Direct supervision/assist for financial management;Assistance with feeding;Assist for transportation;Help with stairs or ramp for entrance    Functional Status Assessment  Patient has had a recent decline in their functional status and demonstrates the ability to make significant improvements in function in a reasonable and predictable  amount of time.  Equipment Recommendations  Other (comment) (defer)    Recommendations for Other Services       Precautions / Restrictions Precautions Precautions: Fall Precaution Comments: bed bug Restrictions Weight Bearing Restrictions: No      Mobility Bed Mobility Overal bed mobility: Needs Assistance Bed Mobility: Supine to Sit, Sit to Supine     Supine to sit: Mod assist Sit to supine: Mod assist        Transfers Overall transfer level: Needs assistance Equipment used: 1 person hand held assist Transfers: Sit to/from Stand, Bed to chair/wheelchair/BSC Sit to Stand: Mod assist     Step pivot transfers: Min assist     General transfer comment: requires multimodal cues      Balance Overall balance assessment: Needs assistance Sitting-balance support: Feet supported Sitting balance-Leahy Scale: Fair     Standing balance support: Bilateral upper extremity supported, During functional activity Standing balance-Leahy Scale: Poor                             ADL either performed or assessed with clinical judgement   ADL Overall ADL's : Needs assistance/impaired Eating/Feeding: Minimal assistance   Grooming: Moderate assistance   Upper Body Bathing: Maximal assistance;Sitting   Lower Body Bathing: Maximal assistance;Sit to/from stand   Upper Body Dressing : Moderate assistance;Sitting   Lower Body Dressing: Maximal assistance;Sit to/from stand   Toilet Transfer: Moderate assistance;Stand-pivot;Rolling walker (2 wheels);BSC/3in1   Toileting- Clothing Manipulation and Hygiene: Maximal assistance;Sit to/from stand       Functional mobility during ADLs: Moderate assistance General ADL Comments: limited by impaired cognition, weakness, unsteady balance     Vision   Additional Comments: difficult to assess     Perception  Perception Perception Tested?: No   Praxis Praxis Praxis tested?: Not tested    Pertinent Vitals/Pain Pain  Assessment Pain Assessment: Faces Pain Score: 0-No pain Pain Intervention(s): Monitored during session     Hand Dominance Right   Extremity/Trunk Assessment Upper Extremity Assessment Upper Extremity Assessment: Generalized weakness   Lower Extremity Assessment Lower Extremity Assessment: Defer to PT evaluation   Cervical / Trunk Assessment Cervical / Trunk Assessment: Kyphotic   Communication Communication Communication: No difficulties   Cognition Arousal/Alertness: Awake/alert Behavior During Therapy: Flat affect Overall Cognitive Status: History of cognitive impairments - at baseline                                 General Comments: alert and follows one step commands with multimodal cues. dementia at baseline, family not present to report baseline cog function     General Comments  VSS on RA    Exercises     Shoulder Instructions      Home Living Family/patient expects to be discharged to:: Private residence Living Arrangements: Spouse/significant other Available Help at Discharge: Family;Available 24 hours/day Type of Home: House Home Access: Level entry     Home Layout: Two level;Able to live on main level with bedroom/bathroom     Bathroom Shower/Tub: Occupational psychologist: Standard Bathroom Accessibility: Yes   Home Equipment: Kasandra Knudsen - single point   Additional Comments: home set up gleaned from prior admission      Prior Functioning/Environment Prior Level of Function : Patient poor historian/Family not available;Needs assist             Mobility Comments: from 08/2022 admission: uses SPC "sometimes" ADLs Comments: from 08/2022: mod I, ADLs limited by impaired cognition; forgets to complete hygiene, stays in clothes for multiple days        OT Problem List: Decreased range of motion;Decreased strength;Impaired balance (sitting and/or standing);Decreased activity tolerance;Decreased safety awareness;Decreased  knowledge of use of DME or AE;Decreased knowledge of precautions      OT Treatment/Interventions: Self-care/ADL training;Therapeutic exercise;DME and/or AE instruction;Therapeutic activities;Patient/family education;Balance training    OT Goals(Current goals can be found in the care plan section) Acute Rehab OT Goals Patient Stated Goal: unable to state OT Goal Formulation: With patient Time For Goal Achievement: 11/28/22 Potential to Achieve Goals: Good ADL Goals Pt Will Perform Grooming: with min guard assist;sitting Pt Will Perform Upper Body Dressing: with supervision;sitting Pt Will Perform Lower Body Dressing: with min assist;sit to/from stand Pt Will Transfer to Toilet: with min assist;ambulating;bedside commode  OT Frequency: Min 2X/week    Co-evaluation              AM-PAC OT "6 Clicks" Daily Activity     Outcome Measure Help from another person eating meals?: A Little Help from another person taking care of personal grooming?: A Little Help from another person toileting, which includes using toliet, bedpan, or urinal?: A Lot Help from another person bathing (including washing, rinsing, drying)?: A Lot Help from another person to put on and taking off regular upper body clothing?: A Lot Help from another person to put on and taking off regular lower body clothing?: A Lot 6 Click Score: 14   End of Session Equipment Utilized During Treatment: Rolling walker (2 wheels) Nurse Communication: Mobility status  Activity Tolerance: Patient tolerated treatment well Patient left: in bed;with call bell/phone within reach;with bed alarm set  OT Visit Diagnosis: Unsteadiness on  feet (R26.81);Other abnormalities of gait and mobility (R26.89);Muscle weakness (generalized) (M62.81)                Time: [id number] OT Time Calculation (min): 24 min Charges:  OT General Charges $OT Visit: 1 Visit OT Evaluation $OT Eval Moderate Complexity: 1 Mod OT Treatments $Therapeutic  Activity: 8-22 mins  Shade Flood, OTR/L Acute Rehabilitation Services Office [phone] Secure Chat Communication Preferred   Elliot Cousin 11/14/2022, 2:49 PM

## 2022-11-14 NOTE — Progress Notes (Signed)
Pt is agitated at this time. Unit RN at bedside and administering night meds. RN to consult when pt is less agitated.

## 2022-11-14 NOTE — Progress Notes (Signed)
Patient agitated hitting and kicking staff. Patient discontinued own iv, notified MD that we do not have access at this time and patient is too agitated for iv team to place new one. Patient did take his bedtime scheduled medicine, will reassess to place new iv once patient is calm and cooperative. Safety measures in place and safety sitter at bedside.

## 2022-11-14 NOTE — Progress Notes (Signed)
Progress Note   Patient: Shane Juarez [id number] DOB: 1930/08/16 DOA: 11/12/2022     1 DOS: the patient was seen and examined on 11/14/2022   Brief hospital course: Taken from H&P.  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 No family is with patient in ER. Pt found to have bed bug infestation.   Apparently patient was becoming progressively weak for the past few days.  There was an increased urinary frequency and he was becoming more incontinent per son.  Son was also concerned that he was coughing while drinking water for the past 1 week.  Appetite remains normal.  At baseline he was able to walk around with the help of walker and lives with his wife. No other recent illnesses.  No fever or chills.  No sick contacts.  On arrival to ED he was hemodynamically stable, afebrile and saturating well on room air. Labs pertinent for WBC 13.5, hemoglobin 9, BUN 27, creatinine 1.61, respiratory panel negative for RSV, influenza and COVID. Chest x-ray concerning for 4 LLL/lingular infiltrate. EKG with normal sinus rhythm.  2/20: Overnight becoming combative and pulling on the vitals including IV.  Vitals remained stable and patient remained afebrile.  CBC with resolution of leukocytosis, hemoglobin decreased to 8.6, renal function with some improvement, BUN 24 and creatinine 1.57, A1c 6.5, troponin barely positive with a flat curve UA with pyuria, bacteriuria and positive nitrite, sending urine culture as add-on. Procalcitonin elevated at 3.96 and BNP normal at 87.6. Swallow evaluation was obtained with family concern of aspiration, son he was recently coughing while drinking water, they recommended dysphagia 3 diet with thin liquid.  Social worker discussed with family regarding bug infestation before making any APS complaint, patient lives with wife, son is  taking some time off to disinfect and clean the house.   Assessment and Plan: * LLL pneumonia Patient with leukocytosis and concern of aspiration Swallow evaluation was obtained and they were recommending dysphagia 3 diet with thin liquid.  Chest x-ray with small left lower lobe infiltrate and procalcitonin elevated at 3.96.  Remained on room air. -Continue with ceftriaxone and Zithromax -Continue with supportive care   Fall at home, initial encounter Patient had a fall with syncopal episode at home.  Family was concerned about right eye swelling, eyeball feels little more prominent. -Head CT reviewed, no acute process noted   UTI (urinary tract infection) -UA concerning for UTI.  Urine culture pending, thus far pos for >100,000 citrobacter species -Patient is already on abx per above   Infestation by bed bug Pt with bedbugs, confirmed at time of presentation -Family to eradicate bed bugs at home. TOC was made aware   Acute renal failure superimposed on stage 3a chronic kidney disease (HCC) - baseline SCr 1.2-1.4 Mild AKI. Baseline Scr 1.2-1.4.   Patient received some gentle fluid. Renal function with some improvement. -Continue with gentle hydration -recheck bmet in AM   Syncope Hx of recurrent syncope.  BNP normal with stable vitals. Recent progressive weakness which can be due to UTI and concern of pneumonia. -PT/OT evaluation ordered   DNR (do not resuscitate) Reviewed VYNCA. DNR form reviewed.      Type 2 diabetes mellitus with diabetic peripheral angiopathy without gangrene, without long-term current use of insulin (HCC) Not on any medications at home.  A1c of 6.5 -SSI  as needed   Dementia without behavioral disturbance (HCC) Chronic.  -Delirium precautions   HTN (hypertension) -Holding home lisinopril because of AKI -Continue with amlodipine -BP stable      Subjective: Without complaints this AM  Physical Exam: Vitals:   11/13/22 2002 11/14/22 0814  11/14/22 1111 11/14/22 1550  BP: (!) 130/45 (!) 144/105 (!) 151/57 (!) 137/59  Pulse: 98 98 97 84  Resp: 16 18  18  $ Temp: 100 F (37.8 C) 97.6 F (36.4 C)  97.6 F (36.4 C)  TempSrc: Oral Oral  Oral  SpO2: 96% 100%  100%   General exam: Awake, laying in bed, in nad Respiratory system: Normal respiratory effort, no wheezing Cardiovascular system: regular rate, s1, s2 Gastrointestinal system: Soft, nondistended, positive BS Central nervous system: CN2-12 grossly intact, strength intact Extremities: Perfused, no clubbing Skin: Normal skin turgor, no notable skin lesions seen Psychiatry: Mood normal // no visual hallucinations   Data Reviewed:  Labs reviewed: Na 136, K 4.4, Cr 1.42   Family Communication: Pt in room, family not at bedside  Disposition: Status is: Inpatient Remains inpatient appropriate because: Severity of illness  Planned Discharge Destination: Home     Author: Marylu Lund, MD 11/14/2022 4:55 PM  For on call review [email or web address].

## 2022-11-14 NOTE — Progress Notes (Signed)
Initial Nutrition Assessment  DOCUMENTATION CODES:   Not applicable  INTERVENTION:  - Add Ensure Enlive po BID, each supplement provides 350 kcal and 20 grams of protein.  - Add MVI q day.   NUTRITION DIAGNOSIS:   Inadequate oral intake related to inability to eat as evidenced by meal completion < 25%.  GOAL:   Patient will meet greater than or equal to 90% of their needs  MONITOR:   PO intake, Supplement acceptance  REASON FOR ASSESSMENT:   Consult Assessment of nutrition requirement/status  ASSESSMENT:   89 y.o. male admits related to syncope and fall. PMH includes: bilateral inguinal hernia, CKD, cognitive impairment, DM, HLD, HTN, PAD, stroke. Pt is currently receiving medical management related to LLL pneumonia.  Meds reviewed: sliding scale insulin. Labs reviewed: BUN/Creatinine high.  Blood sugars well controlled.  The pt is currently oriented x1. The pt has not been eating well since admission. No significant wt loss per record. Pt unable to provide nutrition hx details. RD will add Ensure BID for now. Will continue to monitor PO intakes.   NUTRITION - FOCUSED PHYSICAL EXAM:  Unable to assess, attempt at f/u.   Diet Order:   Diet Order             DIET DYS 3 Room service appropriate? No; Fluid consistency: Thin  Diet effective now                   EDUCATION NEEDS:   Not appropriate for education at this time  Skin:  Skin Assessment: Reviewed RN Assessment  Last BM:  PTA  Height:   Ht Readings from Last 1 Encounters:  07/27/22 5' 7"$  (1.702 m)    Weight:   Wt Readings from Last 1 Encounters:  07/27/22 80 kg    Ideal Body Weight:     BMI:  There is no height or weight on file to calculate BMI.  Estimated Nutritional Needs:   Kcal:  2000-2400 kcals  Protein:  100-120 gm  Fluid:  >/= 2 L  Thalia Bloodgood, RD, LDN, CNSC.

## 2022-11-15 DIAGNOSIS — J189 Pneumonia, unspecified organism: Secondary | ICD-10-CM | POA: Diagnosis not present

## 2022-11-15 DIAGNOSIS — B888 Other specified infestations: Secondary | ICD-10-CM | POA: Diagnosis not present

## 2022-11-15 DIAGNOSIS — R55 Syncope and collapse: Secondary | ICD-10-CM | POA: Diagnosis not present

## 2022-11-15 LAB — GLUCOSE, CAPILLARY
Glucose-Capillary: 152 mg/dL — ABNORMAL HIGH (ref 70–99)
Glucose-Capillary: 164 mg/dL — ABNORMAL HIGH (ref 70–99)
Glucose-Capillary: 163 mg/dL — ABNORMAL HIGH (ref 70–99)
Glucose-Capillary: 119 mg/dL — ABNORMAL HIGH (ref 70–99)

## 2022-11-15 LAB — COMPREHENSIVE METABOLIC PANEL
ALT: 19 U/L (ref 0–44)
AST: 26 U/L (ref 15–41)
Albumin: 3 g/dL — ABNORMAL LOW (ref 3.5–5.0)
Alkaline Phosphatase: 85 U/L (ref 38–126)
Anion gap: 10 (ref 5–15)
BUN: 15 mg/dL (ref 8–23)
CO2: 23 mmol/L (ref 22–32)
Calcium: 8.5 mg/dL — ABNORMAL LOW (ref 8.9–10.3)
Chloride: 104 mmol/L (ref 98–111)
Creatinine, Ser: 1.17 mg/dL (ref 0.61–1.24)
GFR, Estimated: 59 mL/min — ABNORMAL LOW (ref >60)
Glucose, Bld: 154 mg/dL — ABNORMAL HIGH (ref 70–99)
Potassium: 4 mmol/L (ref 3.5–5.1)
Sodium: 137 mmol/L (ref 135–145)
Total Bilirubin: 0.6 mg/dL (ref 0.3–1.2)
Total Protein: 6.9 g/dL (ref 6.5–8.1)

## 2022-11-15 LAB — CBC
HCT: 30.4 % — ABNORMAL LOW (ref 39.0–52.0)
Hemoglobin: 10.2 g/dL — ABNORMAL LOW (ref 13.0–17.0)
MCH: 32.6 pg (ref 26.0–34.0)
MCHC: 33.6 g/dL (ref 30.0–36.0)
MCV: 97.1 fL (ref 80.0–100.0)
Platelets: 296 10*3/uL (ref 150–400)
RBC: 3.13 MIL/uL — ABNORMAL LOW (ref 4.22–5.81)
RDW: 13.8 % (ref 11.5–15.5)
WBC: 8.4 10*3/uL (ref 4.0–10.5)
nRBC: 0 % (ref 0.0–0.2)

## 2022-11-15 LAB — URINE CULTURE: Culture: >=100000 — AB

## 2022-11-15 MED ORDER — HALOPERIDOL LACTATE 5 MG/ML IJ SOLN
5.0000 mg | Freq: Four times a day (QID) | INTRAMUSCULAR | Status: DC | PRN
  Administered 2022-11-15 – 2022-11-22 (×7): 5 mg via INTRAMUSCULAR
  Filled 2022-11-15 (×8): qty 1

## 2022-11-15 NOTE — Progress Notes (Signed)
   11/15/22 1130  Spiritual Encounters  Type of Visit Initial  Care provided to: Pt and family  Conversation partners present during encounter Nurse  Referral source Nurse (RN/NT/LPN)  Reason for visit Urgent spiritual support  OnCall Visit No  Interventions  Spiritual Care Interventions Made Compassionate presence;Prayer;Encouragement  Intervention Outcomes  Outcomes Reduced isolation;Patient family open to resources   Pt. Was agitated and RN thought that prayer would be a helpful intervention.  Chp was not able to communicate much with the pt as he was unresponsive, Chp  speak at length with [name] and provided her with support and care. Prayed with her and encouraged her to utilize the support of spiritual care in the future.

## 2022-11-15 NOTE — Progress Notes (Signed)
Patient still verbally and physically aggressive with staff. No 1:1 sitter available to safely observe patient. Patient continuously trying to get out of bed and not responding with de-escalation techniques/ frequent redirection. PRNs previously administered this shift with no effectiveness. Patient hitting, pinching, and kicking staff. MD made aware, order for soft wrist restraints. Initiated and all additional safety measures in place. Will reconsult iv team at this time to regain access and continue ordered iv fluids. Patient family attempted to be updated with no answer at this time.

## 2022-11-15 NOTE — Progress Notes (Addendum)
Progress Note   Patient: Shane Juarez [id number] DOB: May 06, 1930 DOA: 11/12/2022     2 DOS: the patient was seen and examined on 11/15/2022   Brief hospital course: Taken from H&P.  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 No family is with patient in ER. Pt found to have bed bug infestation.   Apparently patient was becoming progressively weak for the past few days.  There was an increased urinary frequency and he was becoming more incontinent per son.  Son was also concerned that he was coughing while drinking water for the past 1 week.  Appetite remains normal.  At baseline he was able to walk around with the help of walker and lives with his wife. No other recent illnesses.  No fever or chills.  No sick contacts.  On arrival to ED he was hemodynamically stable, afebrile and saturating well on room air. Labs pertinent for WBC 13.5, hemoglobin 9, BUN 27, creatinine 1.61, respiratory panel negative for RSV, influenza and COVID. Chest x-ray concerning for 4 LLL/lingular infiltrate. EKG with normal sinus rhythm.  2/20: Overnight becoming combative and pulling on the vitals including IV.  Vitals remained stable and patient remained afebrile.  CBC with resolution of leukocytosis, hemoglobin decreased to 8.6, renal function with some improvement, BUN 24 and creatinine 1.57, A1c 6.5, troponin barely positive with a flat curve UA with pyuria, bacteriuria and positive nitrite, sending urine culture as add-on. Procalcitonin elevated at 3.96 and BNP normal at 87.6. Swallow evaluation was obtained with family concern of aspiration, son he was recently coughing while drinking water, they recommended dysphagia 3 diet with thin liquid.  Social worker discussed with family regarding bug infestation before making any APS complaint, patient lives with wife, son is  taking some time off to disinfect and clean the house.   Assessment and Plan: * LLL pneumonia Patient with leukocytosis and concern of aspiration Swallow evaluation was obtained and they were recommending dysphagia 3 diet with thin liquid.  Chest x-ray with small left lower lobe infiltrate and procalcitonin elevated at 3.96.  Remained on room air. -Continue with ceftriaxone and Zithromax -Continue with supportive care -WBC has normalized   Fall at home, initial encounter Patient had a fall with syncopal episode at home.  Family was concerned about right eye swelling, eyeball feels little more prominent. -Head CT reviewed, no acute process noted   UTI (urinary tract infection) -UA concerning for UTI.  Urine culture is pos for >100,000 pan-sensitive citrobacter species -Patient is already on abx per above   Infestation by bed bug Pt with bedbugs, confirmed at time of presentation -Family to eradicate bed bugs at home. TOC was made aware   Acute renal failure superimposed on stage 3a chronic kidney disease (HCC) - baseline SCr 1.2-1.4 Mild AKI. Baseline Scr 1.2-1.4.   Patient received some gentle fluid. Renal function now improved with hydration   Syncope Hx of recurrent syncope.  BNP normal with stable vitals. Recent progressive weakness which can be due to UTI and concern of pneumonia. -PT/OT evaluation ordered, pending   DNR (do not resuscitate) Reviewed VYNCA. DNR form reviewed.      Type 2 diabetes mellitus with diabetic peripheral angiopathy without gangrene, without long-term current use of insulin (HCC) Not on any medications at home.  A1c of 6.5 -SSI as needed  Dementia without behavioral disturbance (HCC) Chronic.  -Delirium precautions   HTN (hypertension) -Held home lisinopril because of AKI -Continued with amlodipine -BP remained stable      Subjective: Confused this AM, noted to be combative  Physical Exam: Vitals:   11/14/22 1550 11/14/22 2054  11/15/22 0425 11/15/22 0725  BP: (!) 137/59 (!) 133/54 (!) 140/83 (!) 161/87  Pulse: 84 86 91 94  Resp: 18 18 18 18  $ Temp: 97.6 F (36.4 C)  97.6 F (36.4 C) 97.6 F (36.4 C)  TempSrc: Oral  Oral Oral  SpO2: 100%  100% 99%   General exam: Asleep, in no acute distress Respiratory system: normal chest rise, clear, no audible wheezing Cardiovascular system: regular rhythm, s1-s2 Gastrointestinal system: Nondistended, nontender, pos BS Central nervous system: No seizures, no tremors Extremities: No cyanosis, no joint deformities Skin: No rashes, no pallor Psychiatry: Unable to assess given mentation  Data Reviewed:  Labs reviewed: Na 136, K 4.4, Cr 1.42   Family Communication: Pt in room, family at bedside  Disposition: Status is: Inpatient Remains inpatient appropriate because: Severity of illness  Planned Discharge Destination: Home     Author: Marylu Lund, MD 11/15/2022 2:10 PM  For on call review [email or web address].

## 2022-11-15 NOTE — Progress Notes (Signed)
This charge nurse made aware from primary nurse that patient has been more physically aggressive towards staff. De-escalation teachniques performed however no success. Dr. Wyline Copas made aware.

## 2022-11-15 NOTE — Evaluation (Signed)
Physical Therapy Evaluation Patient Details Name: Shane CHANTHAVONG Sr. MRN: [id number] DOB: October 27, 1929 Today's Date: 11/15/2022  History of Present Illness  89 yo male who presents to ER 11/12/22  after syncope and fall at home. +pneumonia; acute renal failure; Pt found to have bed bug infestation. PMHx: CVA, dementia, type 2 DM, recurrent syncope, CKD stage 3a(baseline Scr 1.2-1.4), ataxia  Clinical Impression   Pt presents with generalized weakness, impaired balance, AMS vs baseline per chart review, and decreased activity tolerance. Pt to benefit from acute PT to address deficits. Pt requiring mod +2 assist for transfer level assist this date, pt standing tolerance <20 seconds at this time. PT to progress mobility as tolerated, and will continue to follow acutely.         Recommendations for follow up therapy are one component of a multi-disciplinary discharge planning process, led by the attending physician.  Recommendations may be updated based on patient status, additional functional criteria and insurance authorization.  Follow Up Recommendations Skilled nursing-short term rehab (<3 hours/day) Can patient physically be transported by private vehicle: No    Assistance Recommended at Discharge Frequent or constant Supervision/Assistance  Patient can return home with the following  A lot of help with walking and/or transfers;A lot of help with bathing/dressing/bathroom    Equipment Recommendations None recommended by PT  Recommendations for Other Services       Functional Status Assessment Patient has had a recent decline in their functional status and/or demonstrates limited ability to make significant improvements in function in a reasonable and predictable amount of time     Precautions / Restrictions Precautions Precautions: Fall Precaution Comments: bed bug Restrictions Weight Bearing Restrictions: No      Mobility  Bed Mobility Overal bed mobility: Needs Assistance Bed  Mobility: Supine to Sit, Sit to Supine     Supine to sit: Mod assist Sit to supine: Mod assist   General bed mobility comments: assist for trunk and LE management, pt with good initiation of movements    Transfers Overall transfer level: Needs assistance Equipment used: 2 person hand held assist Transfers: Sit to/from Stand Sit to Stand: Mod assist           General transfer comment: assist for power up, rise, steady, x2 lateral steps towards HOB. Pt with increasing fatigue with continued standing seen in forward flexed posture. STS x2    Ambulation/Gait                  Stairs            Wheelchair Mobility    Modified Rankin (Stroke Patients Only)       Balance Overall balance assessment: Needs assistance Sitting-balance support: Feet supported Sitting balance-Leahy Scale: Fair     Standing balance support: Bilateral upper extremity supported, During functional activity Standing balance-Leahy Scale: Poor                               Pertinent Vitals/Pain Pain Assessment Pain Assessment: No/denies pain Pain Intervention(s): Monitored during session    Home Living Family/patient expects to be discharged to:: Private residence Living Arrangements: Spouse/significant other Available Help at Discharge: Family;Available 24 hours/day Type of Home: House Home Access: Level entry       Home Layout: Two level;Able to live on main level with bedroom/bathroom Home Equipment: Kasandra Knudsen - single point Additional Comments: home set up gleaned from prior admission    Prior Function  Prior Level of Function : Patient poor historian/Family not available;Needs assist             Mobility Comments: from 08/2022 admission: uses SPC "sometimes" ADLs Comments: from 08/2022: mod I, ADLs limited by impaired cognition; forgets to complete hygiene, stays in clothes for multiple days     Hand Dominance   Dominant Hand: Right    Extremity/Trunk  Assessment   Upper Extremity Assessment Upper Extremity Assessment: Defer to OT evaluation    Lower Extremity Assessment Lower Extremity Assessment: Generalized weakness    Cervical / Trunk Assessment Cervical / Trunk Assessment: Kyphotic  Communication   Communication: No difficulties  Cognition Arousal/Alertness: Awake/alert Behavior During Therapy: Flat affect Overall Cognitive Status: History of cognitive impairments - at baseline                                 General Comments: history of dementia and aggressive behavior, pt calm and pleasant throughout session, polite and thanking PT. Pt speaks tangentially about lawn mower when asked where he is, oriented to self only        General Comments      Exercises General Exercises - Lower Extremity Long Arc Quad: AROM, Both, 10 reps, Seated (visual cues of hands to kick towards)   Assessment/Plan    PT Assessment Patient needs continued PT services  PT Problem List Decreased strength;Decreased mobility;Decreased coordination;Decreased activity tolerance;Decreased cognition;Decreased balance;Decreased knowledge of use of DME       PT Treatment Interventions DME instruction;Therapeutic activities;Gait training;Therapeutic exercise;Patient/family education;Balance training;Functional mobility training;Neuromuscular re-education    PT Goals (Current goals can be found in the Care Plan section)  Acute Rehab PT Goals PT Goal Formulation: With patient Time For Goal Achievement: 11/29/22 Potential to Achieve Goals: Fair    Frequency Min 2X/week     Co-evaluation               AM-PAC PT "6 Clicks" Mobility  Outcome Measure Help needed turning from your back to your side while in a flat bed without using bedrails?: A Lot Help needed moving from lying on your back to sitting on the side of a flat bed without using bedrails?: A Lot Help needed moving to and from a bed to a chair (including a  wheelchair)?: A Lot Help needed standing up from a chair using your arms (e.g., wheelchair or bedside chair)?: A Lot Help needed to walk in hospital room?: Total Help needed climbing 3-5 steps with a railing? : Total 6 Click Score: 10    End of Session   Activity Tolerance: Patient tolerated treatment well;Patient limited by fatigue Patient left: in bed;with call bell/phone within reach;with nursing/sitter in room;with bed alarm set Nurse Communication: Mobility status PT Visit Diagnosis: Other abnormalities of gait and mobility (R26.89);Muscle weakness (generalized) (M62.81)    Time: [id number] PT Time Calculation (min) (ACUTE ONLY): 13 min   Charges:   PT Evaluation $PT Eval Low Complexity: 1 Low         Sharol Croghan S, PT DPT Acute Rehabilitation Services Pager [phone]  Office [phone]   Louis Matte 11/15/2022, 5:19 PM

## 2022-11-16 ENCOUNTER — Inpatient Hospital Stay (HOSPITAL_COMMUNITY): Payer: Medicare Other

## 2022-11-16 DIAGNOSIS — J189 Pneumonia, unspecified organism: Secondary | ICD-10-CM | POA: Diagnosis not present

## 2022-11-16 DIAGNOSIS — R55 Syncope and collapse: Secondary | ICD-10-CM | POA: Diagnosis not present

## 2022-11-16 LAB — COMPREHENSIVE METABOLIC PANEL
ALT: 19 U/L (ref 0–44)
AST: 31 U/L (ref 15–41)
Albumin: 2.4 g/dL — ABNORMAL LOW (ref 3.5–5.0)
Alkaline Phosphatase: 70 U/L (ref 38–126)
Anion gap: 10 (ref 5–15)
BUN: 23 mg/dL (ref 8–23)
CO2: 21 mmol/L — ABNORMAL LOW (ref 22–32)
Calcium: 8 mg/dL — ABNORMAL LOW (ref 8.9–10.3)
Chloride: 106 mmol/L (ref 98–111)
Creatinine, Ser: 1.6 mg/dL — ABNORMAL HIGH (ref 0.61–1.24)
GFR, Estimated: 40 mL/min — ABNORMAL LOW (ref >60)
Glucose, Bld: 225 mg/dL — ABNORMAL HIGH (ref 70–99)
Potassium: 4.2 mmol/L (ref 3.5–5.1)
Sodium: 137 mmol/L (ref 135–145)
Total Bilirubin: 0.7 mg/dL (ref 0.3–1.2)
Total Protein: 5.8 g/dL — ABNORMAL LOW (ref 6.5–8.1)

## 2022-11-16 LAB — CBC
HCT: 24.1 % — ABNORMAL LOW (ref 39.0–52.0)
Hemoglobin: 8.1 g/dL — ABNORMAL LOW (ref 13.0–17.0)
MCH: 32 pg (ref 26.0–34.0)
MCHC: 33.6 g/dL (ref 30.0–36.0)
MCV: 95.3 fL (ref 80.0–100.0)
Platelets: 275 10*3/uL (ref 150–400)
RBC: 2.53 MIL/uL — ABNORMAL LOW (ref 4.22–5.81)
RDW: 13.9 % (ref 11.5–15.5)
WBC: 16.1 10*3/uL — ABNORMAL HIGH (ref 4.0–10.5)
nRBC: 0 % (ref 0.0–0.2)

## 2022-11-16 LAB — GLUCOSE, CAPILLARY
Glucose-Capillary: 196 mg/dL — ABNORMAL HIGH (ref 70–99)
Glucose-Capillary: 168 mg/dL — ABNORMAL HIGH (ref 70–99)
Glucose-Capillary: 99 mg/dL (ref 70–99)
Glucose-Capillary: 113 mg/dL — ABNORMAL HIGH (ref 70–99)

## 2022-11-16 MED ORDER — SODIUM CHLORIDE 0.9 % IV SOLN
3.0000 g | Freq: Two times a day (BID) | INTRAVENOUS | Status: DC
  Administered 2022-11-16 – 2022-11-18 (×5): 3 g via INTRAVENOUS
  Filled 2022-11-16 (×5): qty 8

## 2022-11-16 NOTE — TOC Initial Note (Signed)
Transition of Care (TOC) - Initial/Assessment Note    Patient Details  Name: Shane FASCIANO Sr. MRN: [id number] Date of Birth: [birth year]  Transition of Care [facility]) CM/SW Contact:    Jinger Neighbors, LCSW Phone Number: 11/16/2022, 1:52 PM  Clinical Narrative:                  CSW met with pt's spouse and son at pt's room. CSW discussed recommendations for SNF; spouse is in agreement and would like a facility in [place].  CSW will complete work up   Expected Discharge Plan: [place] Barriers to Discharge: SNF Pending bed offer   Patient Goals and CMS Choice     Choice offered to / list presented to : [place] ownership interest in [facility].provided to:: Spouse    Expected Discharge Plan and Services       Living arrangements for the past 2 months: [place]                                      Prior Living Arrangements/Services Living arrangements for the past 2 months: [place] Lives with:: Spouse Patient language and need for interpreter reviewed:: Yes Do you feel safe going back to the place where you live?: Yes      Need for Family Participation in Patient Care: Yes (Comment) Care giver support system in place?: Yes (comment)   Criminal Activity/Legal Involvement Pertinent to Current Situation/Hospitalization: No - Comment as needed  Activities of Daily Living      Permission Sought/Granted                  Emotional Assessment Appearance:: Appears stated age       Alcohol / Substance Use: Not Applicable Psych Involvement: No (comment)  Admission diagnosis:  Infestation by bed bug [B88.8] Near syncope [R55] LLL pneumonia [J18.9] Urinary tract infection without hematuria, site unspecified [N39.0] Pneumonia of left lung due to infectious organism, unspecified part of lung [J18.9] Patient Active Problem List   Diagnosis Date Noted   UTI (urinary tract infection) 11/13/2022    Fall at home, initial encounter 11/13/2022   Infestation by bed bug 11/12/2022   Dementia without behavioral disturbance ([place]) 11/12/2022   Type 2 diabetes mellitus with diabetic peripheral angiopathy without gangrene, without long-term current use of insulin ([place]) 11/12/2022   LLL pneumonia 11/12/2022   DNR (do not resuscitate) 11/12/2022   Generalized weakness 08/26/2021   Stage 3a chronic kidney disease (CKD) ([place]) 08/26/2021   Cognitive impairment 01/03/2021   HLD (hyperlipidemia) 08/10/2018   Acute renal failure superimposed on stage 3a chronic kidney disease ([place]) - baseline SCr 1.2-1.4 08/10/2018   TIA (transient ischemic attack) 08/10/2018   Syncope 08/09/2018   Alterations of sensations, late effect of cerebrovascular disease(438.6) 04/01/2012   Ataxia, late effect of cerebrovascular disease 02/25/2012   CVA (cerebral vascular accident) ([place]) 01/06/2012   HTN (hypertension) 01/06/2012   PCP:  Vernie Shanks, MD (Inactive) Pharmacy:   [facility], [address] [place]. [place] [address] Phone: [phone] Fax: [phone]     Social Determinants of Health (SDOH) Social History: SDOH Screenings   Tobacco Use: Medium Risk (11/13/2022)   SDOH Interventions:     Readmission Risk Interventions     No data to display

## 2022-11-16 NOTE — Progress Notes (Addendum)
Progress Note   Patient: Shane Juarez [id number] DOB: [birth year] DOA: 11/12/2022     3 DOS: the patient was seen and examined on 11/16/2022   Brief hospital course: Taken from H&P.  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 No family is with patient in ER. Pt found to have bed bug infestation.   Apparently patient was becoming progressively weak for the past few days.  There was an increased urinary frequency and he was becoming more incontinent per son.  Son was also concerned that he was coughing while drinking water for the past 1 week.  Appetite remains normal.  At baseline he was able to walk around with the help of walker and lives with his wife. No other recent illnesses.  No fever or chills.  No sick contacts.  On arrival to ED he was hemodynamically stable, afebrile and saturating well on room air. Labs pertinent for WBC 13.5, hemoglobin 9, BUN 27, creatinine 1.61, respiratory panel negative for RSV, influenza and COVID. Chest x-ray concerning for 4 LLL/lingular infiltrate. EKG with normal sinus rhythm.  2/20: Overnight becoming combative and pulling on the vitals including IV.  Vitals remained stable and patient remained afebrile.  CBC with resolution of leukocytosis, hemoglobin decreased to 8.6, renal function with some improvement, BUN 24 and creatinine 1.57, A1c 6.5, troponin barely positive with a flat curve UA with pyuria, bacteriuria and positive nitrite, sending urine culture as add-on. Procalcitonin elevated at 3.96 and BNP normal at 87.6. Swallow evaluation was obtained with family concern of aspiration, son he was recently coughing while drinking water, they recommended dysphagia 3 diet with thin liquid.  Social worker discussed with family regarding bug infestation before making any APS complaint, patient lives with wife, son is  taking some time off to disinfect and clean the house.   Assessment and Plan: * LLL pneumonia, likely aspiration Patient with leukocytosis and concern of aspiration Swallow evaluation was obtained with initial recs for dysphagia 3 diet with thin liquid.   Chest x-ray with small left lower lobe infiltrate and procalcitonin elevated at 3.96.  Remained on room air. -Had been on ceftriaxone and Zithromax -On 2/23, noted to have acute rise in WBC up to 16.1 -Aspiration witnessed by RN today -Re-consulted SLP -Changed abx to Unasyn to cover aspiration PNA   Fall at home, initial encounter Patient had a fall with syncopal episode at home.  Family was concerned about right eye swelling, eyeball feels little more prominent. -Head CT reviewed, no acute process noted   UTI (urinary tract infection) -UA concerning for UTI.  Urine culture is pos for >100,000 pan-sensitive citrobacter species -Patient is already on abx per above   Infestation by bed bug Pt with bedbugs, confirmed at time of presentation -Family to eradicate bed bugs at home. TOC was made aware   Acute renal failure superimposed on stage 3a chronic kidney disease (HCC) - baseline SCr 1.2-1.4 Mild AKI. Baseline Scr 1.2-1.4.   Patient received some gentle fluid. Renal function initially improved with hydration Cr up to 1.6 today. Cont IVF as tolerated Recheck bmet in AM   Syncope Hx of recurrent syncope.  BNP normal with stable vitals. Recent progressive weakness which can be due to UTI and concern of pneumonia. -PT/OT with recs for SNF. TOC following   DNR (do not resuscitate) Reviewed VYNCA.  DNR form reviewed.      Type 2 diabetes mellitus with diabetic peripheral angiopathy without gangrene, without long-term current use of insulin (HCC) Not on any medications at home.  A1c of 6.5 -SSI as needed   Dementia without behavioral disturbance (HCC) Chronic.  -Delirium precautions   HTN (hypertension) -Held home  lisinopril because of AKI -Continued with amlodipine -BP remained stable      Subjective: difficult to assess given mentation  Physical Exam: Vitals:   11/15/22 1931 11/16/22 0651 11/16/22 0740 11/16/22 0741  BP: (!) 150/65 (!) 152/68  (!) 119/55  Pulse: (!) 101 (!) 102  (!) 101  Resp: 18 18    Temp: 97.6 F (36.4 C)  99.9 F (37.7 C)   TempSrc: Oral Oral    SpO2: 100% 94%    Weight:  71.7 kg     General exam: laying in bed, in nad Respiratory system: Normal respiratory effort, no wheezing Cardiovascular system: regular rate, s1, s2 Gastrointestinal system: Soft, nondistended, positive BS Central nervous system: CN2-12 grossly intact, strength intact Extremities: Perfused, no clubbing Skin: Normal skin turgor, no notable skin lesions seen Psychiatry: Unable to assess given mentation  Data Reviewed:  Labs reviewed: Na 137, K 4.2, Cr 1.60, WBC 16.1   Family Communication: Pt in room, family at bedside  Disposition: Status is: Inpatient Remains inpatient appropriate because: Severity of illness  Planned Discharge Destination: Home     Author: Marylu Lund, MD 11/16/2022 4:13 PM  For on call review [email or web address].

## 2022-11-16 NOTE — NC FL2 (Signed)
[place] LEVEL OF CARE FORM     IDENTIFICATION  Patient Name: Shane HELMICK Sr. Birthdate: 1930/08/09 Sex: male Admission Date (Current Location): 11/12/2022  [facility] and [place] Number:  Herbalist and Address:  [facility] [place], [place] [address], [place], [place] [address]      Provider Number: [id number]  Attending Physician Name and Address:  Donne Hazel, MD  Relative Name and Phone Number:  Tarman,Ethel Spouse [phone]    Current Level of Care: Hospital Recommended Level of Care: [place] Prior Approval Number:    Date Approved/Denied:   PASRR Number: [id number]  Discharge Plan: SNF    Current Diagnoses: Patient Active Problem List   Diagnosis Date Noted   UTI (urinary tract infection) 11/13/2022   Fall at home, initial encounter 11/13/2022   Infestation by bed bug 11/12/2022   Dementia without behavioral disturbance ([place]) 11/12/2022   Type 2 diabetes mellitus with diabetic peripheral angiopathy without gangrene, without long-term current use of insulin ([place]) 11/12/2022   LLL pneumonia 11/12/2022   DNR (do not resuscitate) 11/12/2022   Generalized weakness 08/26/2021   Stage 3a chronic kidney disease (CKD) ([place]) 08/26/2021   Cognitive impairment 01/03/2021   HLD (hyperlipidemia) 08/10/2018   Acute renal failure superimposed on stage 3a chronic kidney disease ([place]) - baseline SCr 1.2-1.4 08/10/2018   TIA (transient ischemic attack) 08/10/2018   Syncope 08/09/2018   Alterations of sensations, late effect of cerebrovascular disease(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) 04/01/2012   Ataxia, late effect of cerebrovascular disease 02/25/2012   CVA (cerebral vascular accident) ([place]) 01/06/2012   HTN (hypertension) 01/06/2012    Orientation RESPIRATION BLADDER Height & Weight     Place, Self  Normal Incontinent, External catheter Weight: 158 lb 1.1 oz (71.7 kg) Height:     BEHAVIORAL SYMPTOMS/MOOD NEUROLOGICAL BOWEL NUTRITION  STATUS        Diet (see d/c summary)  AMBULATORY STATUS COMMUNICATION OF NEEDS Skin   Extensive Assist Verbally Normal                       Personal Care Assistance Level of Assistance  Bathing, Feeding, Dressing Bathing Assistance: Maximum assistance Feeding assistance: Limited assistance Dressing Assistance: Maximum assistance     Functional Limitations Info  Sight, Hearing, Speech Sight Info: Adequate Hearing Info: Adequate Speech Info: Adequate    SPECIAL CARE FACTORS FREQUENCY  PT (By licensed PT), OT (By licensed OT)     PT Frequency: 5x/wk OT Frequency: 5x/wk            Contractures Contractures Info: Not present    Additional Factors Info  Code Status Code Status Info: DNR             Current Medications (11/16/2022):  This is the current hospital active medication list Current Facility-Administered Medications  Medication Dose Route Frequency Provider Last Rate Last Admin   acetaminophen (TYLENOL) tablet 650 mg  650 mg Oral Q6H PRN Kristopher Oppenheim, DO   650 mg at 11/16/22 [id number]   Or   acetaminophen (TYLENOL) suppository 650 mg  650 mg Rectal Q6H PRN Kristopher Oppenheim, DO       amLODipine (NORVASC) tablet 10 mg  10 mg Oral Daily Kristopher Oppenheim, DO   10 mg at 11/16/22 1008   azithromycin (ZITHROMAX) tablet 500 mg  500 mg Oral Daily Kristopher Oppenheim, DO   500 mg at 11/16/22 1008   cefTRIAXone (ROCEPHIN) 2 g in sodium chloride 0.9 % 100  mL IVPB  2 g Intravenous Q24H Kristopher Oppenheim, DO 200 mL/hr at 11/16/22 0825 2 g at 11/16/22 0825   feeding supplement (ENSURE ENLIVE / ENSURE PLUS) liquid 237 mL  237 mL Oral BID BM Donne Hazel, MD   237 mL at 11/16/22 1315   haloperidol lactate (HALDOL) injection 5 mg  5 mg Intramuscular Q6H PRN Donne Hazel, MD   5 mg at 11/15/22 2201   insulin aspart (novoLOG) injection 0-5 Units  0-5 Units Subcutaneous QHS Kristopher Oppenheim, DO       insulin aspart (novoLOG) injection 0-6 Units  0-6 Units Subcutaneous TID WC Kristopher Oppenheim, DO   1 Units at 11/16/22  1315   lactated ringers infusion   Intravenous Continuous Donne Hazel, MD 75 mL/hr at 11/16/22 0517 New Bag at 11/16/22 0517   multivitamin (RENA-VIT) tablet 1 tablet  1 tablet Oral QHS Donne Hazel, MD   1 tablet at 11/15/22 2048   ondansetron [facility]) tablet 4 mg  4 mg Oral Q6H PRN Kristopher Oppenheim, DO       Or   ondansetron [facility]) injection 4 mg  4 mg Intravenous Q6H PRN Kristopher Oppenheim, DO       QUEtiapine (SEROQUEL) tablet 25 mg  25 mg Oral Connye Burkitt, MD   25 mg at 11/15/22 2048     Discharge Medications: Please see discharge summary for a list of discharge medications.  Relevant Imaging Results:  Relevant Lab Results:   Additional Information SS#: [id number]  Jinger Neighbors, LCSW

## 2022-11-16 NOTE — Progress Notes (Signed)
Occupational Therapy Treatment Patient Details Name: Shane CASUCCI Sr. MRN: [id number] DOB: 03-31-1930 Today's Date: 11/16/2022   History of present illness 89 yo male who presents to ER 11/12/22  after syncope and fall at home. +pneumonia; acute renal failure; Pt found to have bed bug infestation. PMHx: CVA, dementia, type 2 DM, recurrent syncope, CKD stage 3a(baseline Scr 1.2-1.4), ataxia   OT comments  Limited progress this treatment session due to lethargic. Patient required mod assist and frequent cues to get to EOB. Patient instructed on washing face and required mod assist to complete. Patient demonstrating posterior leaning and required min to mod assist for balance. Attempted standing from EOB and patient required face to face technique to perform with patient unable to come to complete stand. Patient assisted back to supine with max assist. Patient discharge recommendations continues to be appropriate due to level of assistance needed and need for continued rehab. Acute OT to continue to follow.    Recommendations for follow up therapy are one component of a multi-disciplinary discharge planning process, led by the attending physician.  Recommendations may be updated based on patient status, additional functional criteria and insurance authorization.    Follow Up Recommendations  Skilled nursing-short term rehab (<3 hours/day)     Assistance Recommended at Discharge Frequent or constant Supervision/Assistance  Patient can return home with the following  A lot of help with walking and/or transfers;A lot of help with bathing/dressing/bathroom;Assistance with cooking/housework;Direct supervision/assist for medications management;Direct supervision/assist for financial management;Assistance with feeding;Assist for transportation;Help with stairs or ramp for entrance   Equipment Recommendations  Other (comment) (defer)    Recommendations for Other Services      Precautions / Restrictions  Precautions Precautions: Fall Precaution Comments: bed bug Restrictions Weight Bearing Restrictions: No       Mobility Bed Mobility Overal bed mobility: Needs Assistance Bed Mobility: Supine to Sit, Sit to Supine     Supine to sit: Mod assist Sit to supine: Max assist   General bed mobility comments: required assistance for trunk and BLEs to get to EOB and back to supine    Transfers Overall transfer level: Needs assistance Equipment used: Sliding board Transfers: Sit to/from Stand Sit to Stand: Max assist           General transfer comment: stood from EOB with face to face technique with patient unable to come to full stand     Balance Overall balance assessment: Needs assistance Sitting-balance support: Feet supported Sitting balance-Leahy Scale: Poor Sitting balance - Comments: min to mod assist for sitting balance due to posterior leaning Postural control: Posterior lean Standing balance support: Bilateral upper extremity supported, During functional activity Standing balance-Leahy Scale: Poor Standing balance comment: dependent on therapist for support                           ADL either performed or assessed with clinical judgement   ADL Overall ADL's : Needs assistance/impaired     Grooming: Moderate assistance;Wash/dry hands;Wash/dry face Grooming Details (indicate cue type and reason): seated on EOB                               General ADL Comments: limited by impaired cognition, weakness, unsteady balance    Extremity/Trunk Assessment              Vision       Perception  Praxis      Cognition Arousal/Alertness: Lethargic, Suspect due to medications Behavior During Therapy: Flat affect Overall Cognitive Status: History of cognitive impairments - at baseline                                 General Comments: none verbal during visit, frequent cues to follow directions, history of dementia         Exercises      Shoulder Instructions       General Comments BP supine 112/58, EOB 110/47, Standing 120/89    Pertinent Vitals/ Pain       Pain Assessment Pain Assessment: Faces Faces Pain Scale: No hurt Pain Intervention(s): Monitored during session  Home Living                                          Prior Functioning/Environment              Frequency  Min 2X/week        Progress Toward Goals  OT Goals(current goals can now be found in the care plan section)  Progress towards OT goals: Not progressing toward goals - comment (limited due to lethargic)  Acute Rehab OT Goals OT Goal Formulation: Patient unable to participate in goal setting Time For Goal Achievement: 11/28/22 Potential to Achieve Goals: Good ADL Goals Pt Will Perform Grooming: with min guard assist;sitting Pt Will Perform Upper Body Dressing: with supervision;sitting Pt Will Perform Lower Body Dressing: with min assist;sit to/from stand Pt Will Transfer to Toilet: with min assist;ambulating;bedside commode  Plan Discharge plan remains appropriate    Co-evaluation                 AM-PAC OT "6 Clicks" Daily Activity     Outcome Measure   Help from another person eating meals?: A Little Help from another person taking care of personal grooming?: A Little Help from another person toileting, which includes using toliet, bedpan, or urinal?: A Lot Help from another person bathing (including washing, rinsing, drying)?: A Lot Help from another person to put on and taking off regular upper body clothing?: A Lot Help from another person to put on and taking off regular lower body clothing?: A Lot 6 Click Score: 14    End of Session Equipment Utilized During Treatment: Gait belt  OT Visit Diagnosis: Unsteadiness on feet (R26.81);Other abnormalities of gait and mobility (R26.89);Muscle weakness (generalized) (M62.81)   Activity Tolerance Patient limited by  lethargy   Patient Left in bed;with call bell/phone within reach;with bed alarm set;with family/visitor present;Other (comment) (X-ray team in room)   Nurse Communication Mobility status        Time: [phone] OT Time Calculation (min): 16 min  Charges: OT General Charges $OT Visit: 1 Visit OT Treatments $Self Care/Home Management : 8-22 mins  Lodema Hong, [place]  [facility] [place] 11/16/2022, 2:53 PM

## 2022-11-16 NOTE — Progress Notes (Signed)
I was called into the room by the patient safety sitter. The patient was obviously struggling to cough. The sitter stated that she was feeding him his dysphagia diet and he started coughing and choking. She stated that the patient was unable to swallow and had to suction the food out of his mouth. Upon assessment, the patient was unable to follow commands and the food had to be manually removed from his mouth.

## 2022-11-17 DIAGNOSIS — J189 Pneumonia, unspecified organism: Secondary | ICD-10-CM | POA: Diagnosis not present

## 2022-11-17 DIAGNOSIS — B888 Other specified infestations: Secondary | ICD-10-CM | POA: Diagnosis not present

## 2022-11-17 DIAGNOSIS — R55 Syncope and collapse: Secondary | ICD-10-CM | POA: Diagnosis not present

## 2022-11-17 LAB — CBC
HCT: 27.5 % — ABNORMAL LOW (ref 39.0–52.0)
Hemoglobin: 9.1 g/dL — ABNORMAL LOW (ref 13.0–17.0)
MCH: 31.8 pg (ref 26.0–34.0)
MCHC: 33.1 g/dL (ref 30.0–36.0)
MCV: 96.2 fL (ref 80.0–100.0)
Platelets: 282 10*3/uL (ref 150–400)
RBC: 2.86 MIL/uL — ABNORMAL LOW (ref 4.22–5.81)
RDW: 14.1 % (ref 11.5–15.5)
WBC: 12.1 10*3/uL — ABNORMAL HIGH (ref 4.0–10.5)
nRBC: 0 % (ref 0.0–0.2)

## 2022-11-17 LAB — GLUCOSE, CAPILLARY
Glucose-Capillary: 100 mg/dL — ABNORMAL HIGH (ref 70–99)
Glucose-Capillary: 121 mg/dL — ABNORMAL HIGH (ref 70–99)
Glucose-Capillary: 121 mg/dL — ABNORMAL HIGH (ref 70–99)
Glucose-Capillary: 166 mg/dL — ABNORMAL HIGH (ref 70–99)
Glucose-Capillary: 194 mg/dL — ABNORMAL HIGH (ref 70–99)

## 2022-11-17 LAB — COMPREHENSIVE METABOLIC PANEL
ALT: 21 U/L (ref 0–44)
AST: 29 U/L (ref 15–41)
Albumin: 2.5 g/dL — ABNORMAL LOW (ref 3.5–5.0)
Alkaline Phosphatase: 68 U/L (ref 38–126)
Anion gap: 9 (ref 5–15)
BUN: 22 mg/dL (ref 8–23)
CO2: 24 mmol/L (ref 22–32)
Calcium: 7.9 mg/dL — ABNORMAL LOW (ref 8.9–10.3)
Chloride: 106 mmol/L (ref 98–111)
Creatinine, Ser: 1.58 mg/dL — ABNORMAL HIGH (ref 0.61–1.24)
GFR, Estimated: 41 mL/min — ABNORMAL LOW (ref >60)
Glucose, Bld: 115 mg/dL — ABNORMAL HIGH (ref 70–99)
Potassium: 3.7 mmol/L (ref 3.5–5.1)
Sodium: 139 mmol/L (ref 135–145)
Total Bilirubin: 0.5 mg/dL (ref 0.3–1.2)
Total Protein: 6.2 g/dL — ABNORMAL LOW (ref 6.5–8.1)

## 2022-11-17 NOTE — Progress Notes (Signed)
Dear Doctor:  This patient has been identified as a candidate for PICC for the following reason (s): IV therapy over 48 hours, drug pH or osmolality (causing phlebitis, infiltration in 24 hours), and restarts due to phlebitis and infiltration in 24 hours If you agree, please write an order for the indicated device. For any questions contact the Vascular Access Team at [phone] if no answer, please leave a message.  Thank you for supporting the early vascular access assessment program.

## 2022-11-17 NOTE — Progress Notes (Addendum)
Progress Note   Patient: Shane Juarez [id number] DOB: 1929/10/07 DOA: 11/12/2022     4 DOS: the patient was seen and examined on 11/17/2022   Brief hospital course: Taken from H&P.  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 No family is with patient in ER. Pt found to have bed bug infestation.   Apparently patient was becoming progressively weak for the past few days.  There was an increased urinary frequency and he was becoming more incontinent per son.  Son was also concerned that he was coughing while drinking water for the past 1 week.  Appetite remains normal.  At baseline he was able to walk around with the help of walker and lives with his wife. No other recent illnesses.  No fever or chills.  No sick contacts.  On arrival to ED he was hemodynamically stable, afebrile and saturating well on room air. Labs pertinent for WBC 13.5, hemoglobin 9, BUN 27, creatinine 1.61, respiratory panel negative for RSV, influenza and COVID. Chest x-ray concerning for 4 LLL/lingular infiltrate. EKG with normal sinus rhythm.  2/20: Overnight becoming combative and pulling on the vitals including IV.  Vitals remained stable and patient remained afebrile.  CBC with resolution of leukocytosis, hemoglobin decreased to 8.6, renal function with some improvement, BUN 24 and creatinine 1.57, A1c 6.5, troponin barely positive with a flat curve UA with pyuria, bacteriuria and positive nitrite, sending urine culture as add-on. Procalcitonin elevated at 3.96 and BNP normal at 87.6. Swallow evaluation was obtained with family concern of aspiration, son he was recently coughing while drinking water, they recommended dysphagia 3 diet with thin liquid.  Social worker discussed with family regarding bug infestation before making any APS complaint, patient lives with wife, son is  taking some time off to disinfect and clean the house.   Assessment and Plan: * LLL pneumonia, likely aspiration Patient with leukocytosis and concern of aspiration Swallow evaluation was obtained with initial recs for dysphagia 3 diet with thin liquid.   Chest x-ray with small left lower lobe infiltrate and procalcitonin elevated at 3.96.  Remained on room air. -Had been on ceftriaxone and Zithromax -On 2/23, noted to have acute rise in WBC up to 16.1 -Aspiration witnessed by RN on 2/23 -Re-consulted SLP, recs for dysphagia 1 diet with nectar thick liquid -Now on Unasyn to cover aspiration PNA  Metabolic encephalopathy  -Likely secondary to acute infection -Mentation now much improved   Fall at home, initial encounter Patient had a fall with syncopal episode at home.  Family was concerned about right eye swelling, eyeball feels little more prominent. -Head CT reviewed, no acute process noted   UTI (urinary tract infection) -UA concerning for UTI.  Urine culture is pos for >100,000 pan-sensitive citrobacter species -On abx per above   Infestation by bed bug Pt with bedbugs, confirmed at time of presentation -Family to eradicate bed bugs at home. TOC was made aware   Acute renal failure superimposed on stage 3a chronic kidney disease (HCC) - baseline SCr 1.2-1.4 Mild AKI. Baseline Scr 1.2-1.4.   Patient received some gentle fluid. Renal function now improving with IVF hydration Recheck bmet in AM   Syncope Hx of recurrent syncope.  BNP normal with stable vitals. Recent progressive weakness which can be due to UTI and concern of pneumonia. -PT/OT with recs for  SNF. TOC following   DNR (do not resuscitate) Reviewed VYNCA. DNR form reviewed.      Type 2 diabetes mellitus with diabetic peripheral angiopathy without gangrene, without long-term current use of insulin (HCC) Not on any medications at home.  A1c of 6.5 -SSI as needed   Dementia without behavioral disturbance  (HCC) Chronic.  -Delirium precautions   HTN (hypertension) -Held home lisinopril because of AKI -Continued with amlodipine -BP remained stable      Subjective: Reports feeling better today  Physical Exam: Vitals:   11/16/22 1836 11/16/22 2252 11/17/22 0534 11/17/22 0724  BP: 130/68 108/70 117/60 128/70  Pulse: 99 91 91 91  Resp: '18 16 20 19  '$ Temp: 98.6 F (37 C) (!) 97.2 F (36.2 C) 98.4 F (36.9 C) 98.5 F (36.9 C)  TempSrc: Oral Oral Oral Axillary  SpO2: 97% 97% 97% 96%  Weight:       General exam: Conversant, in no acute distress Respiratory system: normal chest rise, clear, no audible wheezing Cardiovascular system: regular rhythm, s1-s2 Gastrointestinal system: Nondistended, nontender, pos BS Central nervous system: No seizures, no tremors Extremities: No cyanosis, no joint deformities Skin: No rashes, no pallor Psychiatry: Affect normal // no auditory hallucinations   Data Reviewed:  Labs reviewed: Na 139, K 3.7, Cr 1.58, WBC 12.1   Family Communication: Pt in room, family not at bedside  Disposition: Status is: Inpatient Remains inpatient appropriate because: Severity of illness  Planned Discharge Destination: Home     Author: Marylu Lund, MD 11/17/2022 3:33 PM  For on call review [email or web address].

## 2022-11-18 DIAGNOSIS — J189 Pneumonia, unspecified organism: Secondary | ICD-10-CM | POA: Diagnosis not present

## 2022-11-18 DIAGNOSIS — R55 Syncope and collapse: Secondary | ICD-10-CM | POA: Diagnosis not present

## 2022-11-18 DIAGNOSIS — B888 Other specified infestations: Secondary | ICD-10-CM | POA: Diagnosis not present

## 2022-11-18 LAB — COMPREHENSIVE METABOLIC PANEL
ALT: 30 U/L (ref 0–44)
AST: 40 U/L (ref 15–41)
Albumin: 2.6 g/dL — ABNORMAL LOW (ref 3.5–5.0)
Alkaline Phosphatase: 73 U/L (ref 38–126)
Anion gap: 11 (ref 5–15)
BUN: 17 mg/dL (ref 8–23)
CO2: 24 mmol/L (ref 22–32)
Calcium: 8.2 mg/dL — ABNORMAL LOW (ref 8.9–10.3)
Chloride: 104 mmol/L (ref 98–111)
Creatinine, Ser: 1.18 mg/dL (ref 0.61–1.24)
GFR, Estimated: 58 mL/min — ABNORMAL LOW (ref >60)
Glucose, Bld: 142 mg/dL — ABNORMAL HIGH (ref 70–99)
Potassium: 4.3 mmol/L (ref 3.5–5.1)
Sodium: 139 mmol/L (ref 135–145)
Total Bilirubin: 0.7 mg/dL (ref 0.3–1.2)
Total Protein: 6.2 g/dL — ABNORMAL LOW (ref 6.5–8.1)

## 2022-11-18 LAB — GLUCOSE, CAPILLARY
Glucose-Capillary: 123 mg/dL — ABNORMAL HIGH (ref 70–99)
Glucose-Capillary: 123 mg/dL — ABNORMAL HIGH (ref 70–99)
Glucose-Capillary: 143 mg/dL — ABNORMAL HIGH (ref 70–99)
Glucose-Capillary: 148 mg/dL — ABNORMAL HIGH (ref 70–99)
Glucose-Capillary: 154 mg/dL — ABNORMAL HIGH (ref 70–99)
Glucose-Capillary: 200 mg/dL — ABNORMAL HIGH (ref 70–99)

## 2022-11-18 LAB — CBC
HCT: 27 % — ABNORMAL LOW (ref 39.0–52.0)
Hemoglobin: 9 g/dL — ABNORMAL LOW (ref 13.0–17.0)
MCH: 32 pg (ref 26.0–34.0)
MCHC: 33.3 g/dL (ref 30.0–36.0)
MCV: 96.1 fL (ref 80.0–100.0)
Platelets: 294 10*3/uL (ref 150–400)
RBC: 2.81 MIL/uL — ABNORMAL LOW (ref 4.22–5.81)
RDW: 13.8 % (ref 11.5–15.5)
WBC: 9.5 10*3/uL (ref 4.0–10.5)
nRBC: 0 % (ref 0.0–0.2)

## 2022-11-18 MED ORDER — SODIUM CHLORIDE 0.9 % IV SOLN
3.0000 g | Freq: Three times a day (TID) | INTRAVENOUS | Status: DC
  Administered 2022-11-18 – 2022-11-21 (×8): 3 g via INTRAVENOUS
  Filled 2022-11-18 (×8): qty 8

## 2022-11-18 MED ORDER — HYDRALAZINE HCL 20 MG/ML IJ SOLN: 10.0000 mg | INTRAMUSCULAR | Status: DC | PRN

## 2022-11-18 NOTE — Progress Notes (Signed)
PHARMACY NOTE:  ANTIMICROBIAL RENAL DOSAGE ADJUSTMENT  Current antimicrobial regimen includes a mismatch between antimicrobial dosage and estimated renal function.  As per policy approved by the Pharmacy & Therapeutics and Medical Executive Committees, the antimicrobial dosage will be adjusted accordingly.  Current antimicrobial dosage:  Unasyn 3 g IV q12h  Indication: aspiration PNA  Renal Function:  Estimated Creatinine Clearance: 38.1 mL/min (by C-G formula based on SCr of 1.18 mg/dL). '[]'$      On intermittent HD, scheduled: '[]'$      On CRRT    Antimicrobial dosage has been changed to:  Unasyn 3 g IV q8h  Additional comments:   Thank you for allowing pharmacy to be a part of this patient's care.  Jeneen Rinks, [facility] [phone] 3:16 PM

## 2022-11-18 NOTE — Progress Notes (Signed)
Progress Note   Patient: Shane Juarez [id number] DOB: 1930-06-23 DOA: 11/12/2022     5 DOS: the patient was seen and examined on 11/18/2022   Brief hospital course: Taken from H&P.  89 yo AAM with hx of CVA, dementia, type 2 DM, recurrent syncope, CKD stage 3a(baseline Scr 87-1.4), hx of ataxia due to prior CVA, presents to ER after syncope and fall at home. No family is with patient in ER. Pt found to have bed bug infestation.   Apparently patient was becoming progressively weak for the past few days.  There was an increased urinary frequency and he was becoming more incontinent per son.  Son was also concerned that he was coughing while drinking water for the past 1 week.  Appetite remains normal.  At baseline he was able to walk around with the help of walker and lives with his wife. No other recent illnesses.  No fever or chills.  No sick contacts.  On arrival to ED he was hemodynamically stable, afebrile and saturating well on room air. Labs pertinent for WBC 13.5, hemoglobin 9, BUN 27, creatinine 1.61, respiratory panel negative for RSV, influenza and COVID. Chest x-ray concerning for 4 LLL/lingular infiltrate. EKG with normal sinus rhythm.  2/20: Overnight becoming combative and pulling on the vitals including IV.  Vitals remained stable and patient remained afebrile.  CBC with resolution of leukocytosis, hemoglobin decreased to 8.6, renal function with some improvement, BUN 24 and creatinine 1.57, A1c 6.5, troponin barely positive with a flat curve UA with pyuria, bacteriuria and positive nitrite, sending urine culture as add-on. Procalcitonin elevated at 3.96 and BNP normal at 87.6. Swallow evaluation was obtained with family concern of aspiration, son he was recently coughing while drinking water, they recommended dysphagia 3 diet with thin liquid.  Social worker discussed with family regarding bug infestation before making any APS complaint, patient lives with wife, son is  taking some time off to disinfect and clean the house.   Assessment and Plan: * LLL pneumonia, likely aspiration Patient with leukocytosis and concern of aspiration Swallow evaluation was obtained with initial recs for dysphagia 3 diet with thin liquid.   Chest x-ray with small left lower lobe infiltrate and procalcitonin elevated at 3.96.  Remained on room air. -Had been on ceftriaxone and Zithromax -On 2/23, noted to have acute rise in WBC up to 16.1 -Aspiration witnessed by RN on 2/23 -Re-consulted SLP, recs for dysphagia 1 diet with nectar thick liquid -Continue Unasyn to cover aspiration PNA, improving  Metabolic encephalopathy  -Likely secondary to acute infection -Mentation now much improved   Fall at home, initial encounter Patient had a fall with syncopal episode at home.  Family was concerned about right eye swelling, eyeball feels little more prominent. -Head CT reviewed, no acute process noted   UTI (urinary tract infection) -UA concerning for UTI.  Urine culture is pos for >100,000 pan-sensitive citrobacter species -On abx per above   Infestation by bed bug Pt with bedbugs, confirmed at time of presentation -Family to eradicate bed bugs at home. TOC was made aware -Plan for SNF at d/c per PT   Acute renal failure superimposed on stage 3a chronic kidney disease ([place]) - baseline SCr 1.2-1.4 Mild AKI. Baseline Scr 87-1.4.   Patient received some gentle fluid. Renal function now improved with IVF hydration   Syncope Hx of recurrent syncope.  BNP normal with stable vitals. Recent progressive weakness which can be due to UTI and concern of pneumonia. -PT/OT  with recs for SNF. TOC following   DNR (do not resuscitate) Reviewed VYNCA. DNR form reviewed.      Type 2 diabetes mellitus with diabetic peripheral angiopathy without gangrene, without long-term current use of insulin (HCC) Not on any medications at home.  A1c of 6.5 -SSI as needed   Dementia without  behavioral disturbance (HCC) Chronic.  -Delirium precautions   HTN (hypertension) -Held home lisinopril because of AKI -Continued with amlodipine -BP remained stable      Subjective: Without complaints or issues  Physical Exam: Vitals:   11/17/22 0724 11/17/22 2053 11/18/22 0453 11/18/22 1039  BP: 128/70 (!) 172/54 (!) 153/82 135/87  Pulse: 91 93 86 91  Resp: '19 18 15 18  '$ Temp: 98.5 F (36.9 C)  97.8 F (36.6 C) 97.9 F (36.6 C)  TempSrc: Axillary  Axillary Oral  SpO2: 96% 100% 98% 100%  Weight:       General exam: Awake, laying in bed, in nad Respiratory system: Normal respiratory effort, no wheezing Cardiovascular system: regular rate, s1, s2 Gastrointestinal system: Soft, nondistended, positive BS Central nervous system: CN2-12 grossly intact, strength intact Extremities: Perfused, no clubbing Skin: Normal skin turgor, no notable skin lesions seen Psychiatry: Mood normal // no visual hallucinations   Data Reviewed:  Labs reviewed: Na 139, K 4.3, Cr 1.18, Hgb 9.0   Family Communication: Pt in room, family at bedside  Disposition: Status is: Inpatient Remains inpatient appropriate because: Severity of illness  Planned Discharge Destination: Skilled nursing facility     Author: Marylu Lund, MD 11/18/2022 4:39 PM  For on call review [email or web address].

## 2022-11-19 DIAGNOSIS — J189 Pneumonia, unspecified organism: Secondary | ICD-10-CM | POA: Diagnosis not present

## 2022-11-19 DIAGNOSIS — R55 Syncope and collapse: Secondary | ICD-10-CM | POA: Diagnosis not present

## 2022-11-19 DIAGNOSIS — B888 Other specified infestations: Secondary | ICD-10-CM | POA: Diagnosis not present

## 2022-11-19 LAB — COMPREHENSIVE METABOLIC PANEL
ALT: 31 U/L (ref 0–44)
AST: 35 U/L (ref 15–41)
Albumin: 2.4 g/dL — ABNORMAL LOW (ref 3.5–5.0)
Alkaline Phosphatase: 64 U/L (ref 38–126)
Anion gap: 8 (ref 5–15)
BUN: 18 mg/dL (ref 8–23)
CO2: 24 mmol/L (ref 22–32)
Calcium: 7.9 mg/dL — ABNORMAL LOW (ref 8.9–10.3)
Chloride: 105 mmol/L (ref 98–111)
Creatinine, Ser: 1.21 mg/dL (ref 0.61–1.24)
GFR, Estimated: 57 mL/min — ABNORMAL LOW (ref >60)
Glucose, Bld: 151 mg/dL — ABNORMAL HIGH (ref 70–99)
Potassium: 4.2 mmol/L (ref 3.5–5.1)
Sodium: 137 mmol/L (ref 135–145)
Total Bilirubin: 0.5 mg/dL (ref 0.3–1.2)
Total Protein: 6 g/dL — ABNORMAL LOW (ref 6.5–8.1)

## 2022-11-19 LAB — CBC
HCT: 26.5 % — ABNORMAL LOW (ref 39.0–52.0)
Hemoglobin: 8.9 g/dL — ABNORMAL LOW (ref 13.0–17.0)
MCH: 32 pg (ref 26.0–34.0)
MCHC: 33.6 g/dL (ref 30.0–36.0)
MCV: 95.3 fL (ref 80.0–100.0)
Platelets: 311 10*3/uL (ref 150–400)
RBC: 2.78 MIL/uL — ABNORMAL LOW (ref 4.22–5.81)
RDW: 13.8 % (ref 11.5–15.5)
WBC: 8.3 10*3/uL (ref 4.0–10.5)
nRBC: 0 % (ref 0.0–0.2)

## 2022-11-19 LAB — GLUCOSE, CAPILLARY
Glucose-Capillary: 131 mg/dL — ABNORMAL HIGH (ref 70–99)
Glucose-Capillary: 128 mg/dL — ABNORMAL HIGH (ref 70–99)
Glucose-Capillary: 155 mg/dL — ABNORMAL HIGH (ref 70–99)
Glucose-Capillary: 117 mg/dL — ABNORMAL HIGH (ref 70–99)

## 2022-11-19 NOTE — Progress Notes (Signed)
Progress Note   Patient: Shane Juarez [id number] DOB: 1930-03-14 DOA: 11/12/2022     6 DOS: the patient was seen and examined on 11/19/2022   Brief hospital course: Taken from H&P.  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 No family is with patient in ER. Pt found to have bed bug infestation.   Apparently patient was becoming progressively weak for the past few days.  There was an increased urinary frequency and he was becoming more incontinent per son.  Son was also concerned that he was coughing while drinking water for the past 1 week.  Appetite remains normal.  At baseline he was able to walk around with the help of walker and lives with his wife. No other recent illnesses.  No fever or chills.  No sick contacts.  On arrival to ED he was hemodynamically stable, afebrile and saturating well on room air. Labs pertinent for WBC 13.5, hemoglobin 9, BUN 27, creatinine 1.61, respiratory panel negative for RSV, influenza and COVID. Chest x-ray concerning for 4 LLL/lingular infiltrate. EKG with normal sinus rhythm.  2/20: Overnight becoming combative and pulling on the vitals including IV.  Vitals remained stable and patient remained afebrile.  CBC with resolution of leukocytosis, hemoglobin decreased to 8.6, renal function with some improvement, BUN 24 and creatinine 1.57, A1c 6.5, troponin barely positive with a flat curve UA with pyuria, bacteriuria and positive nitrite, sending urine culture as add-on. Procalcitonin elevated at 3.96 and BNP normal at 87.6. Swallow evaluation was obtained with family concern of aspiration, son he was recently coughing while drinking water, they recommended dysphagia 3 diet with thin liquid.  Social worker discussed with family regarding bug infestation before making any APS complaint, patient lives with wife, son is  taking some time off to disinfect and clean the house.   Assessment and Plan: * LLL pneumonia, likely aspiration Patient with leukocytosis and concern of aspiration Swallow evaluation was obtained with initial recs for dysphagia 3 diet with thin liquid.   Chest x-ray with small left lower lobe infiltrate and procalcitonin elevated at 3.96.  Remained on room air. -Had been on ceftriaxone and Zithromax -On 2/23, noted to have acute rise in WBC up to 16.1 -Aspiration witnessed by RN on 2/23 -Re-consulted SLP, recs for dysphagia 1 diet with nectar thick liquid -Had been continued on Unasyn to cover aspiration PNA, clinically improved  Metabolic encephalopathy  -Likely secondary to acute infection -Mentation now much improved   Fall at home, initial encounter Patient had a fall with syncopal episode at home.  Family was concerned about right eye swelling, eyeball feels little more prominent. -Head CT reviewed, no acute process noted   UTI (urinary tract infection) -UA concerning for UTI.  Urine culture is pos for >100,000 pan-sensitive citrobacter species -On abx per above   Infestation by bed bug Pt with bedbugs, confirmed at time of presentation -Family to eradicate bed bugs at home. TOC was made aware -Plan for SNF at d/c per PT   Acute renal failure superimposed on stage 3a chronic kidney disease ([place]) - baseline SCr 1.2-1.4 Mild AKI. Baseline Scr 1.2-1.4.   Patient received some gentle fluid. Renal function now improved with IVF hydration Recheck bmet in AM   Syncope Hx of recurrent syncope.  BNP normal with stable vitals. Recent progressive weakness which can be  due to UTI and concern of pneumonia. -PT/OT with recs for SNF. TOC following   DNR (do not resuscitate) Reviewed VYNCA. DNR form reviewed.      Type 2 diabetes mellitus with diabetic peripheral angiopathy without gangrene, without long-term current use of insulin (HCC) Not on any medications at home.  A1c of  6.5 -Continue SSI as needed   Dementia without behavioral disturbance (HCC) Chronic.  -Delirium precautions   HTN (hypertension) -Held home lisinopril because of AKI -Continued with amlodipine -BP remained stable     Subjective: Reports feeling better today  Physical Exam: Vitals:   11/18/22 1731 11/18/22 2041 11/19/22 0505 11/19/22 0810  BP: (!) 152/68 (!) 160/60 (!) 153/68 (!) 136/45  Pulse: 96 95 98 85  Resp: '17 20 20 16  '$ Temp: 98.6 F (37 C) 98.1 F (36.7 C) 98.3 F (36.8 C) 97.7 F (36.5 C)  TempSrc: Oral Oral Oral Axillary  SpO2: 100% 100% 97% 97%  Weight:       General exam: Conversant, in no acute distress Respiratory system: normal chest rise, clear, no audible wheezing Cardiovascular system: regular rhythm, s1-s2 Gastrointestinal system: Nondistended, nontender, pos BS Central nervous system: No seizures, no tremors Extremities: No cyanosis, no joint deformities Skin: No rashes, no pallor Psychiatry: Affect normal // no auditory hallucinations   Data Reviewed:  Labs reviewed: Na 137, K 4.2, Cr 1.21, Hgb 8.9   Family Communication: Pt in room, family not at bedside  Disposition: Status is: Inpatient Remains inpatient appropriate because: Severity of illness  Planned Discharge Destination: Skilled nursing facility     Author: Marylu Lund, MD 11/19/2022 3:50 PM  For on call review [email or web address].

## 2022-11-19 NOTE — Progress Notes (Signed)
Physical Therapy Treatment Patient Details Name: Shane NEIRA Sr. MRN: [id number] DOB: [birth year] Today's Date: 11/19/2022   History of Present Illness 89 yo male who presents to ER 11/12/22  after syncope and fall at home. +pneumonia; acute renal failure; Pt found to have bed bug infestation. PMHx: CVA, dementia, type 2 DM, recurrent syncope, CKD stage 3a(baseline Scr 1.2-1.4), ataxia    PT Comments    PT focused session on transfer training, including repeated sit<>stands and progression to [facility]. Pt pleasant and following all one-step commands this session. Pt overall requires mod +1-2 for stand pivot level mobility, overall tolerance for mobility improving. Pt with multiple bouts of coughing and secretions this session, requires cuing to spit out secretions at this time. Plan remains appropriate.      Recommendations for follow up therapy are one component of a multi-disciplinary discharge planning process, led by the attending physician.  Recommendations may be updated based on patient status, additional functional criteria and insurance authorization.  Follow Up Recommendations  Skilled nursing-short term rehab (<3 hours/day) Can patient physically be transported by private vehicle: No   Assistance Recommended at Discharge Frequent or constant Supervision/Assistance  Patient can return home with the following A lot of help with walking and/or transfers;A lot of help with bathing/dressing/bathroom   Equipment Recommendations  None recommended by PT    Recommendations for Other Services       Precautions / Restrictions Precautions Precautions: Fall Precaution Comments: bed bug Restrictions Weight Bearing Restrictions: No     Mobility  Bed Mobility Overal bed mobility: Needs Assistance Bed Mobility: Supine to Sit, Sit to Supine     Supine to sit: Mod assist Sit to supine: Mod assist   General bed mobility comments: assist for trunk and LE management, boost up in bed upon  return to supine.    Transfers Overall transfer level: Needs assistance Equipment used: 1 person hand held assist Transfers: Sit to/from Stand, Bed to chair/wheelchair/BSC Sit to Stand: Mod assist   Step pivot transfers: Mod assist, +2 physical assistance       General transfer comment: assist for power up, rise, steady, and pivot to/from [facility] guiding pt hands to [facility] to aide in task of moving stand>sit. STS x5, x4 from EOB and x1 from [facility].    Ambulation/Gait                   Stairs             Wheelchair Mobility    Modified Rankin (Stroke Patients Only)       Balance Overall balance assessment: Needs assistance Sitting-balance support: Feet supported Sitting balance-Leahy Scale: Fair Sitting balance - Comments: min to mod assist for sitting balance due to posterior leaning Postural control: Posterior lean Standing balance support: Bilateral upper extremity supported, During functional activity Standing balance-Leahy Scale: Poor Standing balance comment: reliant on PT and UE support, PT placed chair in front of pt so he could hold on to the chair back. Standing tolerance x3 minutes while NT assisted in pericare, STS x5                            Cognition Arousal/Alertness: Awake/alert Behavior During Therapy: Flat affect Overall Cognitive Status: History of cognitive impairments - at baseline  General Comments: pt minimally verbal during session, does nod and shake head as well as follow one-step commands. history of dementia        Exercises      General Comments        Pertinent Vitals/Pain Pain Assessment Pain Assessment: Faces Faces Pain Scale: No hurt Pain Intervention(s): Monitored during session    Home Living                          Prior Function            PT Goals (current goals can now be found in the care plan section) Acute Rehab PT Goals PT Goal  Formulation: With patient Time For Goal Achievement: 11/29/22 Potential to Achieve Goals: Fair Progress towards PT goals: Progressing toward goals    Frequency    Min 2X/week      PT Plan Current plan remains appropriate    Co-evaluation              AM-PAC PT "6 Clicks" Mobility   Outcome Measure  Help needed turning from your back to your side while in a flat bed without using bedrails?: A Lot Help needed moving from lying on your back to sitting on the side of a flat bed without using bedrails?: A Lot Help needed moving to and from a bed to a chair (including a wheelchair)?: A Lot Help needed standing up from a chair using your arms (e.g., wheelchair or bedside chair)?: A Lot Help needed to walk in hospital room?: Total Help needed climbing 3-5 steps with a railing? : Total 6 Click Score: 10    End of Session   Activity Tolerance: Patient tolerated treatment well;Patient limited by fatigue Patient left: in bed;with call bell/phone within reach;with nursing/sitter in room;with bed alarm set Nurse Communication: Mobility status;Other (comment) (RUE suspicious for infiltration, NT notified RN) PT Visit Diagnosis: Other abnormalities of gait and mobility (R26.89);Muscle weakness (generalized) (M62.81)     Time: [id number] PT Time Calculation (min) (ACUTE ONLY): 28 min  Charges:  $Therapeutic Activity: 23-37 mins                     Stacie Glaze, PT DPT Acute Rehabilitation Services Pager [phone]  Office [phone]    Shane Juarez 11/19/2022, 11:52 AM

## 2022-11-19 NOTE — Progress Notes (Signed)
Nutrition Follow-up  DOCUMENTATION CODES:   Not applicable  INTERVENTION:  - Continue Ensure Enlive po BID, each supplement provides 350 kcal and 20 grams of protein.  - Continue Rena-vit  NUTRITION DIAGNOSIS:   Inadequate oral intake related to inability to eat as evidenced by meal completion < 25%.  GOAL:   Patient will meet greater than or equal to 90% of their needs  MONITOR:   PO intake, Supplement acceptance  REASON FOR ASSESSMENT:   Consult Assessment of nutrition requirement/status  ASSESSMENT:   89 y.o. male admits related to syncope and fall. PMH includes: bilateral inguinal hernia, CKD, cognitive impairment, DM, HLD, HTN, PAD, stroke. Pt is currently receiving medical management related to LLL pneumonia.  Meds reviewed: sliding scale insulin, Rena-vit. Labs reviewed: blood sugars well controlled.   Pt now disoriented x4. Pt ate 10% of his breakfast this am. Per record, intakes were mostly 50-100% prior to today. Pt made DNR. RD will continue to monitor PO intakes.   Diet Order:   Diet Order             DIET - DYS 1 Room service appropriate? No; Fluid consistency: Nectar Thick  Diet effective now                   EDUCATION NEEDS:   Not appropriate for education at this time  Skin:  Skin Assessment: Reviewed RN Assessment  Last BM:  11/18/22  Height:   Ht Readings from Last 1 Encounters:  07/27/22 '5\' 7"'$  (1.702 m)    Weight:   Wt Readings from Last 1 Encounters:  11/16/22 71.7 kg    Ideal Body Weight:     BMI:  Body mass index is 24.76 kg/m.  Estimated Nutritional Needs:   Kcal:  2000-2400 kcals  Protein:  100-120 gm  Fluid:  >/= 2 L  Thalia Bloodgood, RD, LDN, CNSC.

## 2022-11-20 DIAGNOSIS — R55 Syncope and collapse: Secondary | ICD-10-CM | POA: Diagnosis not present

## 2022-11-20 DIAGNOSIS — J189 Pneumonia, unspecified organism: Secondary | ICD-10-CM | POA: Diagnosis not present

## 2022-11-20 LAB — COMPREHENSIVE METABOLIC PANEL
ALT: 31 U/L (ref 0–44)
AST: 40 U/L (ref 15–41)
Albumin: 2.5 g/dL — ABNORMAL LOW (ref 3.5–5.0)
Alkaline Phosphatase: 69 U/L (ref 38–126)
Anion gap: 11 (ref 5–15)
BUN: 17 mg/dL (ref 8–23)
CO2: 22 mmol/L (ref 22–32)
Calcium: 8.2 mg/dL — ABNORMAL LOW (ref 8.9–10.3)
Chloride: 105 mmol/L (ref 98–111)
Creatinine, Ser: 1.02 mg/dL (ref 0.61–1.24)
GFR, Estimated: >60 mL/min (ref >60)
Glucose, Bld: 148 mg/dL — ABNORMAL HIGH (ref 70–99)
Potassium: 4.6 mmol/L (ref 3.5–5.1)
Sodium: 138 mmol/L (ref 135–145)
Total Bilirubin: 0.9 mg/dL (ref 0.3–1.2)
Total Protein: 6.3 g/dL — ABNORMAL LOW (ref 6.5–8.1)

## 2022-11-20 LAB — CBC
HCT: 29.5 % — ABNORMAL LOW (ref 39.0–52.0)
Hemoglobin: 9.9 g/dL — ABNORMAL LOW (ref 13.0–17.0)
MCH: 32 pg (ref 26.0–34.0)
MCHC: 33.6 g/dL (ref 30.0–36.0)
MCV: 95.5 fL (ref 80.0–100.0)
Platelets: 320 10*3/uL (ref 150–400)
RBC: 3.09 MIL/uL — ABNORMAL LOW (ref 4.22–5.81)
RDW: 13.7 % (ref 11.5–15.5)
WBC: 7.7 10*3/uL (ref 4.0–10.5)
nRBC: 0 % (ref 0.0–0.2)

## 2022-11-20 LAB — GLUCOSE, CAPILLARY
Glucose-Capillary: 128 mg/dL — ABNORMAL HIGH (ref 70–99)
Glucose-Capillary: 186 mg/dL — ABNORMAL HIGH (ref 70–99)
Glucose-Capillary: 161 mg/dL — ABNORMAL HIGH (ref 70–99)
Glucose-Capillary: 78 mg/dL (ref 70–99)

## 2022-11-20 NOTE — TOC Progression Note (Addendum)
Transition of Care (TOC) - Progression Note    Patient Details  Name: Shane STROJNY Sr. MRN: [id number] Date of Birth: [birth year]  Transition of Care [facility]) CM/SW [place] Phone Number: 11/20/2022, 1:04 PM  Clinical Narrative:     CSW called pt's daughter and provided SNF offers and medicare star ratings verbally. She states she plans on visiting facilities before making choice. CSW also answered daughters questions related to [facility] paperwork and future ALF placement.  TOC will continue to follow for SNF choice and eventual DC to SNF  Expected Discharge Plan: [place] Barriers to Discharge: SNF Pending bed offer  Expected Discharge Plan and Services       Living arrangements for the past 2 months: Skilled Nursing Facility                                       Social Determinants of Health (SDOH) Interventions SDOH Screenings   Tobacco Use: Medium Risk (11/13/2022)    Readmission Risk Interventions     No data to display

## 2022-11-20 NOTE — Progress Notes (Signed)
Occupational Therapy Treatment Patient Details Name: Shane BENETTI Sr. MRN: [id number] DOB: [birth year] Today's Date: 11/20/2022   History of present illness 88 yo male who presents to ER 11/12/22  after syncope and fall at home. +pneumonia; acute renal failure; Pt found to have bed bug infestation. PMHx: CVA, dementia, type 2 DM, recurrent syncope, CKD stage 3a(baseline Scr 1.2-1.4), ataxia   OT comments  Patient demonstrated gains since last visit. Patient was mod assist to get to EOB and required min to mod assist for sitting balance but progressed to min guard with increased time. Patient able to perform transfer to [facility] with HHA and mod assist for balance and safety. Patient performed static standing from EOB with mod assist using HHA and bed rail. Patient continues to be appropriate for SNF following discharge for continued OT services to address functional transfers and self care.    Recommendations for follow up therapy are one component of a multi-disciplinary discharge planning process, led by the attending physician.  Recommendations may be updated based on patient status, additional functional criteria and insurance authorization.    Follow Up Recommendations  Skilled nursing-short term rehab (<3 hours/day)     Assistance Recommended at Discharge Frequent or constant Supervision/Assistance  Patient can return home with the following  A lot of help with walking and/or transfers;A lot of help with bathing/dressing/bathroom;Assistance with cooking/housework;Direct supervision/assist for medications management;Direct supervision/assist for financial management;Assistance with feeding;Assist for transportation;Help with stairs or ramp for entrance   Equipment Recommendations  Other (comment) (defer)    Recommendations for Other Services      Precautions / Restrictions Precautions Precautions: Fall Precaution Comments: bed bug Restrictions Weight Bearing Restrictions: No        Mobility Bed Mobility Overal bed mobility: Needs Assistance Bed Mobility: Supine to Sit, Sit to Supine     Supine to sit: Mod assist Sit to supine: Mod assist   General bed mobility comments: cues for rail use with assistance to scoot hips to EOB    Transfers Overall transfer level: Needs assistance Equipment used: 1 person hand held assist Transfers: Sit to/from Stand, Bed to chair/wheelchair/BSC Sit to Stand: Mod assist Stand pivot transfers: Mod assist         General transfer comment: mod assist HHA to transfer to [facility] and back to EOB with cues for hand placement and safety     Balance Overall balance assessment: Needs assistance Sitting-balance support: Feet supported Sitting balance-Leahy Scale: Fair Sitting balance - Comments: min to mod assist initially with posterior and right lateral leaning, progressed to min guard assist Postural control: Posterior lean, Right lateral lean Standing balance support: Bilateral upper extremity supported, During functional activity Standing balance-Leahy Scale: Poor Standing balance comment: Stood with hand held assist and oher hand using rail from bed to assist.                           ADL either performed or assessed with clinical judgement   ADL Overall ADL's : Needs assistance/impaired     Grooming: Wash/dry hands;Wash/dry face;Supervision/safety;Sitting Grooming Details (indicate cue type and reason): seated on EOB                 Toilet Transfer: Moderate assistance;Stand-pivot;BSC/3in1 Armed forces technical officer Details (indicate cue type and reason): HHA and mod assist with cues for hand placement and safety           General ADL Comments: increased time, improvement with following directions  Extremity/Trunk Assessment              Vision       Perception     Praxis      Cognition Arousal/Alertness: Awake/alert Behavior During Therapy: Flat affect Overall Cognitive Status: History of  cognitive impairments - at baseline                                 General Comments: difficult to understand when verbal, often responds with head nods        Exercises      Shoulder Instructions       General Comments      Pertinent Vitals/ Pain       Pain Assessment Pain Assessment: Faces Faces Pain Scale: No hurt Pain Intervention(s): Monitored during session  Home Living                                          Prior Functioning/Environment              Frequency  Min 2X/week        Progress Toward Goals  OT Goals(current goals can now be found in the care plan section)  Progress towards OT goals: Progressing toward goals  Acute Rehab OT Goals OT Goal Formulation: Patient unable to participate in goal setting Time For Goal Achievement: 11/28/22 Potential to Achieve Goals: Good ADL Goals Pt Will Perform Grooming: with min guard assist;sitting Pt Will Perform Upper Body Dressing: with supervision;sitting Pt Will Perform Lower Body Dressing: with min assist;sit to/from stand Pt Will Transfer to Toilet: with min assist;ambulating;bedside commode  Plan Discharge plan remains appropriate    Co-evaluation                 AM-PAC OT "6 Clicks" Daily Activity     Outcome Measure   Help from another person eating meals?: A Little Help from another person taking care of personal grooming?: A Little Help from another person toileting, which includes using toliet, bedpan, or urinal?: A Lot Help from another person bathing (including washing, rinsing, drying)?: A Lot Help from another person to put on and taking off regular upper body clothing?: A Lot Help from another person to put on and taking off regular lower body clothing?: A Lot 6 Click Score: 14    End of Session Equipment Utilized During Treatment: Gait belt  OT Visit Diagnosis: Unsteadiness on feet (R26.81);Other abnormalities of gait and mobility  (R26.89);Muscle weakness (generalized) (M62.81)   Activity Tolerance Patient tolerated treatment well   Patient Left in bed;with call bell/phone within reach;with bed alarm set;with nursing/sitter in room   Nurse Communication Mobility status        Time: [id number] OT Time Calculation (min): 23 min  Charges: OT General Charges $OT Visit: 1 Visit OT Treatments $Self Care/Home Management : 8-22 mins $Therapeutic Activity: 8-22 mins  Lodema Hong, [place]  Office [phone]   Trixie Dredge 11/20/2022, 9:56 AM

## 2022-11-20 NOTE — Progress Notes (Signed)
Speech Language Pathology Treatment: Dysphagia  Patient Details Name: Shane MURPHY Sr. MRN: [id number] DOB: 12/10/1929 Today's Date: 11/20/2022 Time: [id number] SLP Time Calculation (min) (ACUTE ONLY): 8 min  Assessment / Plan / Recommendation Clinical Impression  Pt seen for dysphagia intervention following repeat bedside swallow eval on Friday. Today he was alert with increased overall awareness of his surroundings and receptive to po's. He was able to consume nectar thick liquids using straw and applesauce trials without cough, throat clear and his vocal quality was clear without delays in pharyngeal swallow (from informal observation). Given his dementia and propensity to wax and wane in regards to cognitive status and alertness, recommend he continue with puree texture, nectar thick liquids and crush pills. Can do instrumental testing if/when becomes appropriate versus a more Palliative care approach. ST will follow.    HPI HPI: 89 yo with hx of CVA, dementia, type 2 DM, recurrent syncope, CKD stage, hx of ataxia due to prior CVA, presents to ER after syncope and fall at home. Pt found to have bed bug infestation. CXR with LLL/lingular infiltrates. BSE 2/20 recommended Dys 3/thin and signed off. Reconsulted today due to oral holding and son told MD coughing with water for the past week.      SLP Plan  Continue with current plan of care      Recommendations for follow up therapy are one component of a multi-disciplinary discharge planning process, led by the attending physician.  Recommendations may be updated based on patient status, additional functional criteria and insurance authorization.    Recommendations  Diet recommendations: Dysphagia 1 (puree);Nectar-thick liquid Liquids provided via: Cup;Straw Medication Administration: Crushed with puree Supervision: Staff to assist with self feeding;Full supervision/cueing for compensatory strategies Compensations: Minimize environmental  distractions;Slow rate;Small sips/bites;Lingual sweep for clearance of pocketing Postural Changes and/or Swallow Maneuvers: Seated upright 90 degrees                Oral Care Recommendations: Oral care BID Follow Up Recommendations:  (TBD) Assistance recommended at discharge: Frequent or constant Supervision/Assistance SLP Visit Diagnosis: Dysphagia, unspecified (R13.10) Plan: Continue with current plan of care           Houston Siren  11/20/2022, 11:25 AM

## 2022-11-20 NOTE — Progress Notes (Signed)
Progress Note   Patient: Jadriel Ebron [id number] DOB: [birth year] DOA: 11/12/2022     7 DOS: the patient was seen and examined on 11/20/2022   Brief hospital course: Taken from H&P.  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 No family is with patient in ER. Pt found to have bed bug infestation.   Apparently patient was becoming progressively weak for the past few days.  There was an increased urinary frequency and he was becoming more incontinent per son.  Son was also concerned that he was coughing while drinking water for the past 1 week.  Appetite remains normal.  At baseline he was able to walk around with the help of walker and lives with his wife. No other recent illnesses.  No fever or chills.  No sick contacts.  On arrival to ED he was hemodynamically stable, afebrile and saturating well on room air. Labs pertinent for WBC 13.5, hemoglobin 9, BUN 27, creatinine 1.61, respiratory panel negative for RSV, influenza and COVID. Chest x-ray concerning for 4 LLL/lingular infiltrate. EKG with normal sinus rhythm.  2/20: Overnight becoming combative and pulling on the vitals including IV.  Vitals remained stable and patient remained afebrile.  CBC with resolution of leukocytosis, hemoglobin decreased to 8.6, renal function with some improvement, BUN 24 and creatinine 1.57, A1c 6.5, troponin barely positive with a flat curve UA with pyuria, bacteriuria and positive nitrite, sending urine culture as add-on. Procalcitonin elevated at 3.96 and BNP normal at 87.6. Swallow evaluation was obtained with family concern of aspiration, son he was recently coughing while drinking water, they recommended dysphagia 3 diet with thin liquid.  Social worker discussed with family regarding bug infestation before making any APS complaint, patient lives with wife, son is  taking some time off to disinfect and clean the house.   Assessment and Plan: * LLL pneumonia, likely aspiration Patient with leukocytosis and concern of aspiration Swallow evaluation was obtained with initial recs for dysphagia 3 diet with thin liquid.   Chest x-ray with small left lower lobe infiltrate and procalcitonin elevated at 3.96.  Remained on room air. -Pt was initially started on ceftriaxone and Zithromax -On 2/23, noted to have acute rise in WBC up to 16.1 and aspiration witnessed by RN  -Re-consulted SLP, recs for dysphagia 1 diet with nectar thick liquid -Had been continued on Unasyn to cover aspiration PNA, now clinically improving  Metabolic encephalopathy  -Likely secondary to acute infection -Mentation now much improved   Fall at home, initial encounter Patient had a fall with syncopal episode at home.  Family was concerned about right eye swelling, eyeball feels little more prominent. -Head CT reviewed, no acute process noted   UTI (urinary tract infection) -UA concerning for UTI.  Urine culture is pos for >100,000 pan-sensitive citrobacter species -On abx per above   Infestation by bed bug Pt with bedbugs, confirmed at time of presentation -Family to eradicate bed bugs at home. TOC was made aware -Plan for SNF at d/c per PT   Acute renal failure superimposed on stage 3a chronic kidney disease ([place]) - baseline SCr 1.2-1.4 Mild AKI. Baseline Scr 1.2-1.4.   Patient received some gentle fluid. Renal function now normalized with IVF   Syncope Hx of recurrent syncope.  BNP normal with stable vitals. Recent progressive weakness which can be due to  UTI and concern of pneumonia. -PT/OT with recs for SNF. TOC following   DNR (do not resuscitate) Reviewed [facility]. DNR form reviewed.      Type 2 diabetes mellitus with diabetic peripheral angiopathy without gangrene, without long-term current use of insulin (HCC) Not on any medications at home.  A1c of  6.5 -Continue SSI as needed   Dementia without behavioral disturbance (HCC) Chronic.  -Delirium precautions   HTN (hypertension) -Held home lisinopril because of AKI -Continued with amlodipine -BP remained stable     Subjective: Without complaints this AM  Physical Exam: Vitals:   11/19/22 0810 11/19/22 1949 11/20/22 0617 11/20/22 0752  BP: (!) 136/45 (!) 152/65 (!) 143/76 132/68  Pulse: 85 94 89 81  Resp: '16 18 18 18  '$ Temp: 97.7 F (36.5 C) 98.5 F (36.9 C)  (!) 97.5 F (36.4 C)  TempSrc: Axillary Oral  Oral  SpO2: 97% 100% 100% 100%  Weight:       General exam: Awake, laying in bed, in nad Respiratory system: Normal respiratory effort, no wheezing Cardiovascular system: regular rate, s1, s2 Gastrointestinal system: Soft, nondistended, positive BS Central nervous system: CN2-12 grossly intact, strength intact Extremities: Perfused, no clubbing Skin: Normal skin turgor, no notable skin lesions seen Psychiatry: Mood normal // no visual hallucinations   Data Reviewed:  Labs reviewed: Na 138, K 4.6, Cr 1.02, WBC 7.7   Family Communication: Pt in room, family not at bedside  Disposition: Status is: Inpatient Remains inpatient appropriate because: Severity of illness  Planned Discharge Destination: Skilled nursing facility     Author: Marylu Lund, MD 11/20/2022 3:09 PM  For on call review [email or web address].

## 2022-11-20 NOTE — Progress Notes (Signed)
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 Pt found to have bed bug infestation. CXR with LLL/lingular infiltrates. BSE 2/20 recommended Dys 3/thin and signed off. Reconsulted today due to oral holding.  Type of Study Bedside Swallow Evaluation  Previous Swallow Assessment  (see HPI)  Diet Prior to this Study NPO  Temperature Spikes Noted No  Respiratory Status Room air  History of Recent Intubation No  Behavior/Cognition Lethargic/Drowsy;Requires cueing;Doesn't follow directions  Oral Cavity Assessment WFL  Oral Care Completed by SLP No  Oral Cavity - Dentition  (missing lower and has several maxillary dentition, wears partials that are at home)  Self-Feeding Abilities Total assist  Patient Positioning Upright in bed  Baseline Vocal Quality  (no vocalizations)  Volitional Cough Cognitively unable to elicit  Volitional Swallow Unable to elicit  Pain Assessment  Pain Assessment Faces  Faces Pain Scale 4  Breathing 0  Negative Vocalization 0  Facial Expression 0  Body Language 1  Consolability 1  PAINAD Score 2  Pain Intervention(s) Monitored during session  Oral Assessment (Complete on admission/transfer/every shift)  Oral Assessment  (WDL) X  Lips Symmetrical  Teeth Missing (Comment)  Tongue Pink;Moist  Mucous Membrane(s) Moist;Pink  Saliva Moist, saliva free flowing  Level of Consciousness  (lethargic)  Is patient on any of following O2 devices? None of the above  Oral Motor/Sensory Function  Overall Oral Motor/Sensory Function  (unable to follow commands)  Ice Chips  Ice chips NT  Thin Liquid  Thin Liquid Impaired  Presentation Straw  Pharyngeal  Phase Impairments Cough - Immediate;Suspected delayed Swallow  Nectar Thick Liquid  Nectar Thick  Liquid NT  Honey Thick Liquid  Honey Thick Liquid NT  Puree  Puree WFL  Solid  Solid NT  SLP - End of Session  Patient left in bed;Other (comment) Research scientist (life sciences) present)  Nurse Communication Diet recommendation;Swallow strategies reviewed  SLP Assessment  Clinical Impression Statement (ACUTE ONLY) Pt known to therapist from earlier in the week and appears to have had a decline in overall function including swallow. On 2/20 he was assessed with wife at bedside and recommended Dys 3 ( due to missing lower dentition and only several maxillary dentition, wears partials that are at home). Earlier today Engineer, manufacturing stated he was awake but unable to use the straw and pocketing his food. During SLP evaluation pt is lethargic but able to wake for po's. He could not follow commands, demonstrated confused behaviors, did not vocalize and kept trying to lie on his side. He coughed with thin liquids and consumed applesauce without s/s aspiration with suspected mild delayed swallows. His dementia appears to be likely exacerbating his swallow function and unfortunately is likely to progress. No family was at the bedside but discussions with wife re: nutrition/swallowing and dementia would be beneficial and Palliative care given progressive nature of illness. ST recommends puree (Dys 1), nectar thick liquids, crush pills. ST will follow.  SLP Visit Diagnosis Dysphagia, unspecified (R13.10)  Impact on safety and function Moderate aspiration risk  Other Related Risk Factors Cognitive impairment;Deconditioning  Swallow Evaluation Recommendations  SLP Diet Recommendations Dysphagia 1 (Puree);Nectar-thick liquid  Liquid Administration via Cup;Straw  Medication Administration Crushed with puree  Supervision Full supervision/cueing for compensatory strategies;Staff to assist  with self feeding  Compensations Minimize environmental distractions;Slow rate;Small sips/bites;Lingual sweep for clearance of pocketing  Postural Changes  Seated upright at 90 degrees  Treatment Plan  Oral Care Recommendations Oral care BID  Treatment Recommendations Therapy as outlined in treatment plan below  Follow Up Recommendations  (TBD)  Functional Status Assessment Patient has had a recent decline in their functional status and demonstrates the ability to make significant improvements in function in a reasonable and predictable amount of time.  Speech Therapy Frequency (ACUTE ONLY) min 2x/week  Treatment Duration 2 weeks  Interventions Aspiration precaution training;Trials of upgraded texture/liquids;Diet toleration management by SLP;Compensatory techniques;Patient/family education  Prognosis  Prognosis for improved oropharyngeal function Fair  Barriers to Reach Goals Cognitive deficits;Severity of deficits  Individuals Consulted  Consulted and Agree with Results and Recommendations Patient;RN  SLP Time Calculation  SLP Start Time (ACUTE ONLY) 1455  SLP Stop Time (ACUTE ONLY) 1510  SLP Time Calculation (min) (ACUTE ONLY) 15 min  SLP Evaluations  $ SLP Speech Visit 1 Visit  SLP Evaluations  $BSS Swallow 1 Procedure

## 2022-11-20 NOTE — Progress Notes (Signed)
Mobility Specialist - Progress Note   11/20/22 1409  Mobility  Activity Transferred to/from [facility]  Level of Assistance Moderate assist, patient does 50-74%  Assistive Device Front wheel walker  Activity Response Tolerated well  Mobility Referral Yes  $Mobility charge 1 Mobility   Pt was received in bed needing assistance to [facility]. Pt was ModA throughout transfer. Pt was MaxA for peri care. Pt was returned to bed with all needs met and bed alarm on.  [name]  Mobility Specialist Please contact via Solicitor or Rehab office at [phone]

## 2022-11-21 DIAGNOSIS — J189 Pneumonia, unspecified organism: Secondary | ICD-10-CM | POA: Diagnosis not present

## 2022-11-21 LAB — COMPREHENSIVE METABOLIC PANEL
ALT: 34 U/L (ref 0–44)
AST: 35 U/L (ref 15–41)
Albumin: 2.6 g/dL — ABNORMAL LOW (ref 3.5–5.0)
Alkaline Phosphatase: 71 U/L (ref 38–126)
Anion gap: 8 (ref 5–15)
BUN: 21 mg/dL (ref 8–23)
CO2: 24 mmol/L (ref 22–32)
Calcium: 8.1 mg/dL — ABNORMAL LOW (ref 8.9–10.3)
Chloride: 106 mmol/L (ref 98–111)
Creatinine, Ser: 1.4 mg/dL — ABNORMAL HIGH (ref 0.61–1.24)
GFR, Estimated: 47 mL/min — ABNORMAL LOW (ref >60)
Glucose, Bld: 129 mg/dL — ABNORMAL HIGH (ref 70–99)
Potassium: 4.5 mmol/L (ref 3.5–5.1)
Sodium: 138 mmol/L (ref 135–145)
Total Bilirubin: 0.7 mg/dL (ref 0.3–1.2)
Total Protein: 6.3 g/dL — ABNORMAL LOW (ref 6.5–8.1)

## 2022-11-21 LAB — GLUCOSE, CAPILLARY
Glucose-Capillary: 100 mg/dL — ABNORMAL HIGH (ref 70–99)
Glucose-Capillary: 110 mg/dL — ABNORMAL HIGH (ref 70–99)
Glucose-Capillary: 111 mg/dL — ABNORMAL HIGH (ref 70–99)
Glucose-Capillary: 143 mg/dL — ABNORMAL HIGH (ref 70–99)
Glucose-Capillary: 230 mg/dL — ABNORMAL HIGH (ref 70–99)

## 2022-11-21 LAB — CBC
HCT: 27.8 % — ABNORMAL LOW (ref 39.0–52.0)
Hemoglobin: 9.3 g/dL — ABNORMAL LOW (ref 13.0–17.0)
MCH: 32.2 pg (ref 26.0–34.0)
MCHC: 33.5 g/dL (ref 30.0–36.0)
MCV: 96.2 fL (ref 80.0–100.0)
Platelets: 340 10*3/uL (ref 150–400)
RBC: 2.89 MIL/uL — ABNORMAL LOW (ref 4.22–5.81)
RDW: 13.8 % (ref 11.5–15.5)
WBC: 8.8 10*3/uL (ref 4.0–10.5)
nRBC: 0 % (ref 0.0–0.2)

## 2022-11-21 MED ORDER — AMOXICILLIN-POT CLAVULANATE 500-125 MG PO TABS
1.0000 | ORAL_TABLET | Freq: Two times a day (BID) | ORAL | Status: DC
  Administered 2022-11-21 (×2): 1 via ORAL
  Filled 2022-11-21 (×4): qty 1

## 2022-11-21 NOTE — Progress Notes (Signed)
PROGRESS NOTE  Shane Juarez  [id number] DOB: Jul 27, 1930 DOA: 11/12/2022 PCP: Vernie Shanks, MD (Inactive)   Brief Narrative: Patient is a 89 year old male with history of CVA, dementia, Dr. Diabetes, recurrent syncope, CKD stage IIIa who presented to the emergency department after syncopal/fall at home.  Patient was also found to have bedbug infestation on presentation.  On presentation, he was hemodynamically stable.  Lab work showed WBC 13.5.  RSV/flu/COVID-negative.  Chest x-ray was concerning for left lower lobe infiltrate suspected to be from aspiration.  Started on antibiotics.  Currently respiratory status stable, on room air.  Plan is to discharge to SNF.  Waiting for bed  Assessment & Plan:  Principal Problem:   LLL pneumonia Active Problems:   Syncope   Acute renal failure superimposed on stage 3a chronic kidney disease (HCC) - baseline SCr 1.2-1.4   Infestation by bed bug   UTI (urinary tract infection)   Fall at home, initial encounter   HTN (hypertension)   Dementia without behavioral disturbance ([place])   Type 2 diabetes mellitus with diabetic peripheral angiopathy without gangrene, without long-term current use of insulin (HCC)   DNR (do not resuscitate)  Left lower lobe pneumonia likely from aspiration: Presented with leukocytosis.  Chest imaging showed left lower lobe infiltrate.  Speech therapy consulted, recommended dysphagia 1 diet.  Procalcitonin was also elevated.  Started on IV antibiotics.  Currently he is on room air.  Not in any respiratory distress.  Leukocytosis resolved.  Will change antibiotics to oral to continue 7 days course  Metabolic encephalopathy, dementia: More confused at baseline condition.  Currently mentation seems to be at baseline.  He obeys commands, but not oriented to time.On Seroquel at nighttime  UTI: Urine cultures showed more than 100,000 colonies of Citrobacter.  Already on antibiotics  AKI on CKD stage IIIa: Baseline creatinine  from 1.2-1.4.  Received some IV fluid during this hospitalization.  Currently kidney function at baseline.  Syncope: History of recurrent syncope.  Likely associated  with progressive weakness on the background of pneumonia, UTI.  PT/OT recommended SNF on discharge.  Also found to be infested by bedbugs on presentation.  Family to eradicate bedbugs at home.  Type 2 diabetes with peripheral neuropathy: Recent A1c of 6.5.  Currently on sliding scale insulin.  Not on any medications at home  Normocytic anemia: Current hemoglobin stable in the range of 9  Hypertension: Currently blood pressure stable.  Lisinopril on hold due to AKI.  Continue amlodipine  CODE STATUS DNR  Nutrition Problem: Inadequate oral intake Etiology: inability to eat    DVT prophylaxis:SCDs Start: 11/12/22 2326     Code Status: DNR  Family Communication: None at bedside  Patient status:Inpatient  Patient is from :Home  Anticipated discharge to:SNF  Estimated DC date:as soon as bed is available   Consultants: None  Procedures:None  Antimicrobials:  Anti-infectives (From admission, onward)    Start     Dose/Rate Route Frequency Ordered Stop   11/18/22 2315  Ampicillin-Sulbactam (UNASYN) 3 g in sodium chloride 0.9 % 100 mL IVPB        3 g 200 mL/hr over 30 Minutes Intravenous Every 8 hours 11/18/22 1518     11/16/22 1515  Ampicillin-Sulbactam (UNASYN) 3 g in sodium chloride 0.9 % 100 mL IVPB  Status:  Discontinued        3 g 200 mL/hr over 30 Minutes Intravenous Every 12 hours 11/16/22 1427 11/18/22 1518   11/13/22 1000  azithromycin (ZITHROMAX) 500  mg in sodium chloride 0.9 % 250 mL IVPB  Status:  Discontinued        500 mg 250 mL/hr over 60 Minutes Intravenous Every 24 hours 11/12/22 2325 11/13/22 0813   11/13/22 0800  cefTRIAXone (ROCEPHIN) 2 g in sodium chloride 0.9 % 100 mL IVPB  Status:  Discontinued        2 g 200 mL/hr over 30 Minutes Intravenous Every 24 hours 11/12/22 2325 11/16/22 1425    11/12/22 2015  cefTRIAXone (ROCEPHIN) 1 g in sodium chloride 0.9 % 100 mL IVPB        1 g 200 mL/hr over 30 Minutes Intravenous  Once 11/12/22 2010 11/12/22 2057   11/12/22 2015  azithromycin (ZITHROMAX) tablet 500 mg  Status:  Discontinued        500 mg Oral Daily 11/12/22 2010 11/16/22 1425       Subjective: Patient seen and examined and examined at the bedside today.  Hemodynamically stable.  Lying on bed.  He ate his breakfast.  Was sleeping when I arrived.  Woke up on calling his name.  Does not display that was.  Obese, soft.  Not oriented to time.Not in any kind of distress  Objective: Vitals:   11/20/22 1552 11/20/22 1957 11/21/22 0358 11/21/22 0808  BP: 123/60 (!) 111/55 (!) 114/55 (!) 144/58  Pulse: 93 89 85 84  Resp: '18 17 17 16  '$ Temp: 97.8 F (36.6 C) 97.9 F (36.6 C) 98 F (36.7 C) 99.8 F (37.7 C)  TempSrc: Oral Oral Oral   SpO2: 97% 100% 99% 98%  Weight:        Intake/Output Summary (Last 24 hours) at 11/21/2022 1128 Last data filed at 11/21/2022 1000 Gross per 24 hour  Intake 1259.11 ml  Output 1200 ml  Net 59.11 ml   Filed Weights   11/16/22 0651  Weight: 71.7 kg    Examination:  General exam: Overall comfortable, not in distress, weak, deconditioned HEENT: PERRL Respiratory system:  no wheezes or crackles  Cardiovascular system: S1 & S2 heard, RRR.  Gastrointestinal system: Abdomen is nondistended, soft and nontender. Central nervous system: Alert and awake but not oriented Extremities: No edema, no clubbing ,no cyanosis Skin: No rashes, no ulcers,no icterus     Data Reviewed: I have personally reviewed following labs and imaging studies  CBC: Recent Labs  Lab 11/17/22 0544 11/18/22 0507 11/19/22 0430 11/20/22 0246 11/21/22 0414  WBC 12.1* 9.5 8.3 7.7 8.8  HGB 9.1* 9.0* 8.9* 9.9* 9.3*  HCT 27.5* 27.0* 26.5* 29.5* 27.8*  MCV 96.2 96.1 95.3 95.5 96.2  PLT 282 294 311 320 [id number]   Basic Metabolic Panel: Recent Labs  Lab 11/17/22 0544  11/18/22 0507 11/19/22 0430 11/20/22 0246 11/21/22 0414  NA 139 139 137 138 138  K 3.7 4.3 4.2 4.6 4.5  CL 106 104 105 105 106  CO2 '24 24 24 22 24  '$ GLUCOSE 115* 142* 151* 148* 129*  BUN '22 17 18 17 21  '$ CREATININE 1.58* 1.18 1.21 1.02 1.40*  CALCIUM 7.9* 8.2* 7.9* 8.2* 8.1*     Recent Results (from the past 240 hour(s))  Resp panel by RT-PCR (RSV, Flu A&B, Covid) Anterior Nasal Swab     Status: None   Collection Time: 11/12/22  6:13 PM   Specimen: Anterior Nasal Swab  Result Value Ref Range Status   SARS Coronavirus 2 by RT PCR NEGATIVE NEGATIVE Final   Influenza A by PCR NEGATIVE NEGATIVE Final   Influenza B by  PCR NEGATIVE NEGATIVE Final    Comment: (NOTE) The Xpert Xpress SARS-CoV-2/FLU/RSV plus assay is intended as an aid in the diagnosis of influenza from Nasopharyngeal swab specimens and should not be used as a sole basis for treatment. Nasal washings and aspirates are unacceptable for Xpert Xpress SARS-CoV-2/FLU/RSV testing.  Fact Sheet for Patients: EntrepreneurPulse.com.au  Fact Sheet for Healthcare Providers: IncredibleEmployment.be  This test is not yet approved or cleared by the [place] FDA and has been authorized for detection and/or diagnosis of SARS-CoV-2 by FDA under an Emergency Use Authorization (EUA). This EUA will remain in effect (meaning this test can be used) for the duration of the COVID-19 declaration under Section 564(b)(1) of the Act, 21 U.S.C. section 360bbb-3(b)(1), unless the authorization is terminated or revoked.     Resp Syncytial Virus by PCR NEGATIVE NEGATIVE Final    Comment: (NOTE) Fact Sheet for Patients: EntrepreneurPulse.com.au  Fact Sheet for Healthcare Providers: IncredibleEmployment.be  This test is not yet approved or cleared by the [place] FDA and has been authorized for detection and/or diagnosis of SARS-CoV-2 by FDA under an Emergency  Use Authorization (EUA). This EUA will remain in effect (meaning this test can be used) for the duration of the COVID-19 declaration under Section 564(b)(1) of the Act, 21 U.S.C. section 360bbb-3(b)(1), unless the authorization is terminated or revoked.  Performed at [facility], [place] [address] [place], [place] [address]   Urine Culture (for pregnant, neutropenic or urologic patients or patients with an indwelling urinary catheter)     Status: Abnormal   Collection Time: 11/13/22  7:30 AM   Specimen: Urine, Clean Catch  Result Value Ref Range Status   Specimen Description URINE, CLEAN CATCH  Final   Special Requests   Final    NONE Performed at [facility], [place] [address] [place], [place] [address]    Culture >=100,000 COLONIES/mL CITROBACTER KOSERI (A)  Final   Report Status 11/15/2022 FINAL  Final   Organism ID, Bacteria CITROBACTER KOSERI (A)  Final      Susceptibility   Citrobacter koseri - MIC*    CEFEPIME <=0.12 SENSITIVE Sensitive     CEFTRIAXONE <=0.25 SENSITIVE Sensitive     CIPROFLOXACIN <=0.25 SENSITIVE Sensitive     GENTAMICIN <=1 SENSITIVE Sensitive     IMIPENEM <=0.25 SENSITIVE Sensitive     NITROFURANTOIN 32 SENSITIVE Sensitive     TRIMETH/SULFA <=20 SENSITIVE Sensitive     PIP/TAZO <=4 SENSITIVE Sensitive     * >=100,000 COLONIES/mL CITROBACTER KOSERI  Culture, blood (Routine X 2) w Reflex to ID Panel     Status: None (Preliminary result)   Collection Time: 11/16/22 12:18 PM   Specimen: BLOOD RIGHT HAND  Result Value Ref Range Status   Specimen Description BLOOD RIGHT HAND  Final   Special Requests   Final    BOTTLES DRAWN AEROBIC AND ANAEROBIC Blood Culture adequate volume   Culture   Final    NO GROWTH 4 DAYS Performed at [facility], [address] [place], [place] [address]    Report Status PENDING  Incomplete  Culture, blood (Routine X 2) w Reflex to ID Panel     Status: None (Preliminary result)   Collection Time: 11/16/22  12:18 PM   Specimen: BLOOD RIGHT HAND  Result Value Ref Range Status   Specimen Description BLOOD RIGHT HAND  Final   Special Requests   Final    BOTTLES DRAWN AEROBIC AND ANAEROBIC Blood Culture adequate volume  Culture   Final    NO GROWTH 4 DAYS Performed at [facility], [place] [address] [place], [place] [address]    Report Status PENDING  Incomplete     Radiology Studies: No results found.  Scheduled Meds:  amLODipine  10 mg Oral Daily   feeding supplement  237 mL Oral BID BM   insulin aspart  0-5 Units Subcutaneous QHS   insulin aspart  0-6 Units Subcutaneous TID WC   multivitamin  1 tablet Oral QHS   QUEtiapine  25 mg Oral QHS   Continuous Infusions:  ampicillin-sulbactam (UNASYN) IV 3 g (11/21/22 0617)     LOS: 8 days   Shelly Coss, MD [facility] P2/28/2024, 11:28 AM

## 2022-11-21 NOTE — TOC Progression Note (Signed)
Transition of Care (TOC) - Progression Note    Patient Details  Name: Shane Juarez. MRN: [id number] Date of Birth: Feb 06, 1930  Transition of Care [facility]) CM/SW Contact  Jinger Neighbors, [place] Phone Number: 11/21/2022, 12:34 PM  Clinical Narrative:     CSW consulted with Pilar Plate, pt's son about facilites. Delancey reports he hasn't spoken to his sister and CSW encouraged the conversation. Mohmmad returned call to [facility] and reports they prefer [facility] [address] and plan to visit the facility today. Pt is medically stable and ready to d/c. TOC will continue to follow.    Expected Discharge Plan: Skilled Nursing Facility Barriers to Discharge: SNF Pending bed offer  Expected Discharge Plan and Services       Living arrangements for the past 2 months: [place]                                       Social Determinants of Health (SDOH) Interventions SDOH Screenings   Tobacco Use: Medium Risk (11/13/2022)    Readmission Risk Interventions     No data to display

## 2022-11-22 DIAGNOSIS — J189 Pneumonia, unspecified organism: Secondary | ICD-10-CM | POA: Diagnosis not present

## 2022-11-22 LAB — BASIC METABOLIC PANEL
Anion gap: 7 (ref 5–15)
BUN: 25 mg/dL — ABNORMAL HIGH (ref 8–23)
CO2: 24 mmol/L (ref 22–32)
Calcium: 7.9 mg/dL — ABNORMAL LOW (ref 8.9–10.3)
Chloride: 107 mmol/L (ref 98–111)
Creatinine, Ser: 1.37 mg/dL — ABNORMAL HIGH (ref 0.61–1.24)
GFR, Estimated: 49 mL/min — ABNORMAL LOW (ref >60)
Glucose, Bld: 125 mg/dL — ABNORMAL HIGH (ref 70–99)
Potassium: 4.5 mmol/L (ref 3.5–5.1)
Sodium: 138 mmol/L (ref 135–145)

## 2022-11-22 LAB — CULTURE, BLOOD (ROUTINE X 2)
Culture: NO GROWTH
Culture: NO GROWTH
Special Requests: ADEQUATE
Special Requests: ADEQUATE

## 2022-11-22 LAB — GLUCOSE, CAPILLARY
Glucose-Capillary: 114 mg/dL — ABNORMAL HIGH (ref 70–99)
Glucose-Capillary: 189 mg/dL — ABNORMAL HIGH (ref 70–99)

## 2022-11-22 MED ORDER — RENA-VITE PO TABS: 1.0000 | ORAL_TABLET | Freq: Every day | ORAL | 0 refills | Status: DC

## 2022-11-22 MED ORDER — ACETAMINOPHEN 325 MG PO TABS: 650.0000 mg | ORAL_TABLET | Freq: Four times a day (QID) | ORAL | Status: DC | PRN

## 2022-11-22 MED ORDER — AMOXICILLIN-POT CLAVULANATE 500-125 MG PO TABS: 1.0000 | ORAL_TABLET | Freq: Two times a day (BID) | ORAL | 0 refills | Status: AC

## 2022-11-22 MED ORDER — QUETIAPINE FUMARATE 25 MG PO TABS: 25.0000 mg | ORAL_TABLET | Freq: Every day | ORAL | Status: DC

## 2022-11-22 MED ORDER — ENSURE ENLIVE PO LIQD: 237.0000 mL | Freq: Two times a day (BID) | ORAL | 12 refills | Status: DC

## 2022-11-22 NOTE — Progress Notes (Signed)
Physical Therapy Treatment Patient Details Name: Shane Juarez Sr. MRN: [id number] DOB: 1930/04/06 Today's Date: 11/22/2022   History of Present Illness 89 yo male who presents to ER 11/12/22  after syncope and fall at home. +pneumonia; acute renal failure; Pt found to have bed bug infestation. PMHx: CVA, dementia, type 2 DM, recurrent syncope, CKD stage 3a(baseline Scr 1.2-1.4), ataxia    PT Comments    Pt agreeable to PT session. Pt tolerates EOB sitting and x2 sit<>stands to work on LE strengthening and activity tolerance, pt does require rest breaks between stands and declines further attempts after 2 given fatigue. Pt noted to have pocketed food in mouth prior to PT session, PT cued pt to spit it out and he expelled copious secretions and food when cued, RN notified. PT to continue to follow.     Recommendations for follow up therapy are one component of a multi-disciplinary discharge planning process, led by the attending physician.  Recommendations may be updated based on patient status, additional functional criteria and insurance authorization.  Follow Up Recommendations  Skilled nursing-short term rehab (<3 hours/day) Can patient physically be transported by private vehicle: No   Assistance Recommended at Discharge Frequent or constant Supervision/Assistance  Patient can return home with the following A lot of help with walking and/or transfers;A lot of help with bathing/dressing/bathroom   Equipment Recommendations  None recommended by PT    Recommendations for Other Services       Precautions / Restrictions Precautions Precautions: Fall Precaution Comments: bed bugs (day 9); L handmitt Restrictions Weight Bearing Restrictions: No     Mobility  Bed Mobility Overal bed mobility: Needs Assistance Bed Mobility: Supine to Sit, Sit to Supine     Supine to sit: Mod assist, HOB elevated Sit to supine: Mod assist, HOB elevated   General bed mobility comments: assist for  trunk and LE management, simple cues for moving to/from EOB    Transfers Overall transfer level: Needs assistance Equipment used: 1 person hand held assist Transfers: Sit to/from Stand, Bed to chair/wheelchair/BSC Sit to Stand: Mod assist Stand pivot transfers: Mod assist         General transfer comment: assist for power up, rise, steadying, side stepping towards HOB. x2 trials, pt using chair back to stand and steady.    Ambulation/Gait               General Gait Details: nt, given pt tolerance   Stairs             Wheelchair Mobility    Modified Rankin (Stroke Patients Only)       Balance Overall balance assessment: Needs assistance Sitting-balance support: Feet supported Sitting balance-Leahy Scale: Fair Sitting balance - Comments: can sit without PT support Postural control: Posterior lean, Right lateral lean Standing balance support: Bilateral upper extremity supported, During functional activity Standing balance-Leahy Scale: Poor Standing balance comment: reliant on external support                            Cognition Arousal/Alertness: Awake/alert Behavior During Therapy: Flat affect Overall Cognitive Status: History of cognitive impairments - at baseline                                 General Comments: history of dementia; pt pocketing food in mouth upon PT arrival and spits copious amount of food and phlegm when cued.  Exercises      General Comments        Pertinent Vitals/Pain Pain Assessment Pain Assessment: Faces Faces Pain Scale: No hurt Pain Intervention(s): Monitored during session    Home Living                          Prior Function            PT Goals (current goals can now be found in the care plan section) Acute Rehab PT Goals PT Goal Formulation: With patient Time For Goal Achievement: 11/29/22 Potential to Achieve Goals: Fair Progress towards PT goals:  Progressing toward goals    Frequency    Min 2X/week      PT Plan Current plan remains appropriate    Co-evaluation              AM-PAC PT "6 Clicks" Mobility   Outcome Measure  Help needed turning from your back to your side while in a flat bed without using bedrails?: A Lot Help needed moving from lying on your back to sitting on the side of a flat bed without using bedrails?: A Lot Help needed moving to and from a bed to a chair (including a wheelchair)?: A Lot Help needed standing up from a chair using your arms (e.g., wheelchair or bedside chair)?: A Lot Help needed to walk in hospital room?: Total Help needed climbing 3-5 steps with a railing? : Total 6 Click Score: 10    End of Session   Activity Tolerance: Patient tolerated treatment well;Patient limited by fatigue Patient left: in bed;with call bell/phone within reach;with bed alarm set Nurse Communication: Mobility status;Other (comment) (pt pocketing food) PT Visit Diagnosis: Other abnormalities of gait and mobility (R26.89);Muscle weakness (generalized) (M62.81)     Time: [id number] PT Time Calculation (min) (ACUTE ONLY): 15 min  Charges:  $Therapeutic Activity: 8-22 mins                     Stacie Glaze, PT DPT Acute Rehabilitation Services Pager [phone]  Office [phone]    Roxine Caddy E Ruffin Pyo 11/22/2022, 10:33 AM

## 2022-11-22 NOTE — Care Plan (Signed)
Today Shane Juarez was AxOx1, agitated around noon unwilling to eat breakfast and lunch. He was trying to get down from bed, wanting to leave. I tried to give meds crushed in apple sauce but patient was keeping everything in his mouth. In the afternoon his nice visited and patient was more cooperative, took his Ensure and after fell asleep.  Called to give report to [phone] but nurse at the receiving facility was unavailable. The Secretary at [place] told me that Shane Juarez has arrived.

## 2022-11-22 NOTE — Progress Notes (Signed)
NT and this nurse both unable to obtain CBG at this time. Patient refused procedure

## 2022-11-22 NOTE — TOC Transition Note (Signed)
Transition of Care [facility]) - CM/SW Discharge Note   Patient Details  Name: Shane SHIDELER Sr. MRN: [id number] Date of Birth: 09-02-1930  Transition of Care [facility]) CM/SW Contact:  Jinger Neighbors, LCSW Phone Number: 11/22/2022, 1:34 PM   Clinical Narrative:     Pt going to [facility] via [place].   Call to report and room number were provided to RN.   CSW called pt's son, Zuhair Turrentine and called [facility] for transportation.    Final next level of care: Skilled Nursing Facility Barriers to Discharge: No Barriers Identified   Patient Goals and CMS Choice CMS Medicare.gov Compare Post Acute Care list provided to:: Patient Represenative (must comment) (Adult children) Choice offered to / list presented to : Adult Children  Discharge Placement                Patient chooses bed at: [facility] Patient to be transferred to facility by: [place] Name of family member notified: Raelyn Mora. Patient and family notified of of transfer: 11/22/22  Discharge Plan and Services Additional resources added to the After Visit Summary for                                       Social Determinants of Health (SDOH) Interventions SDOH Screenings   Tobacco Use: Medium Risk (11/13/2022)     Readmission Risk Interventions     No data to display

## 2022-11-22 NOTE — Discharge Summary (Addendum)
Physician Discharge Summary  Shane HOPPA Sr. [id number] DOB: 23-Aug-1930 DOA: 11/12/2022  PCP: Vernie Shanks, MD (Inactive)  Admit date: 11/12/2022 Discharge date: 11/22/2022  Admitted From: Home Disposition:  Home  Discharge Condition:Stable CODE STATUS:DNR Diet recommendation: Dysphagia 1   Brief/Interim Summary: Patient is a 89 year old male with history of CVA, dementia, Dr. Diabetes, recurrent syncope, CKD stage IIIa who presented to the emergency department after syncopal/fall at home.  Patient was also found to have bedbug infestation on presentation.  On presentation, he was hemodynamically stable.  Lab work showed WBC 13.5.  RSV/flu/COVID-negative.  Chest x-ray was concerning for left lower lobe infiltrate suspected to be from aspiration.  Started on antibiotics.  Currently respiratory status stable, on room air.  Medically stable for dc to SNF.   Following problems were addressed during the hospitalization:  Left lower lobe pneumonia likely from aspiration: Presented with leukocytosis.  Chest imaging showed left lower lobe infiltrate.  Speech therapy consulted, recommended dysphagia 1 diet.  Procalcitonin was also elevated.  Started on IV antibiotics.  Currently he is on room air.  Not in any respiratory distress.  Leukocytosis resolved.  Changed antibiotics to oral.   Metabolic encephalopathy, dementia: More confused on presentation than baseline.  Currently mentation seems to be at baseline.  He obeys commands, but not oriented to time.On Seroquel at nighttime   UTI: Urine cultures showed more than 100,000 colonies of Citrobacter.  Already on antibiotics   AKI on CKD stage IIIa: Baseline creatinine from 1.2-1.4.  Received some IV fluid during this hospitalization.  Currently kidney function at baseline.   Syncope: History of recurrent syncope.  Likely associated  with progressive weakness on the background of pneumonia, UTI.  PT/OT recommended SNF on discharge.  Also found to  be infested by bedbugs on presentation.  Family planning to eradicate bedbugs at home.   Type 2 diabetes with peripheral neuropathy: Recent A1c of 6.5.  Currently on sliding scale insulin. We will continue to monitor him off medications.Check HbA1c in 3 months   Normocytic anemia: Current hemoglobin stable in the range of 9   Hypertension: Currently blood pressure stable.  Lisinopril on hold due to AKI.  Continue amlodipine.   Discharge Diagnoses:  Principal Problem:   LLL pneumonia Active Problems:   Syncope   Acute renal failure superimposed on stage 3a chronic kidney disease (HCC) - baseline SCr 1.2-1.4   Infestation by bed bug   UTI (urinary tract infection)   Fall at home, initial encounter   HTN (hypertension)   Dementia without behavioral disturbance ([place])   Type 2 diabetes mellitus with diabetic peripheral angiopathy without gangrene, without long-term current use of insulin (HCC)   DNR (do not resuscitate)    Discharge Instructions  Discharge Instructions     Diet general   Complete by: As directed    Dysphagia 1 diet   Discharge instructions   Complete by: As directed    1)Please take prescribed medications as instructed 2)Do a CBC and BMP tests in a week   Increase activity slowly   Complete by: As directed    No wound care   Complete by: As directed       Allergies as of 11/22/2022       Reactions   Losartan Potassium    Other reaction(s): made him sick   Nsaids    Other reaction(s): elevated kidney tests        Medication List     STOP taking these medications  lisinopril 40 MG tablet Commonly known as: ZESTRIL       TAKE these medications    acetaminophen 325 MG tablet Commonly known as: TYLENOL Take 2 tablets (650 mg total) by mouth every 6 (six) hours as needed for mild pain (or Fever >/= 101).   amLODipine 10 MG tablet Commonly known as: NORVASC Take 10 mg by mouth daily.   amoxicillin-clavulanate 500-125 MG tablet Commonly  known as: AUGMENTIN Take 1 tablet by mouth 2 (two) times daily for 4 doses.   aspirin 81 MG tablet Take 81 mg by mouth daily.   atorvastatin 20 MG tablet Commonly known as: LIPITOR Take 20 mg by mouth every evening.   feeding supplement Liqd Take 237 mLs by mouth 2 (two) times daily between meals.   multivitamin Tabs tablet Take 1 tablet by mouth at bedtime.   QUEtiapine 25 MG tablet Commonly known as: SEROQUEL Take 1 tablet (25 mg total) by mouth at bedtime. What changed:  medication strength how much to take when to take this        Allergies  Allergen Reactions   Losartan Potassium     Other reaction(s): made him sick   Nsaids     Other reaction(s): elevated kidney tests    Consultations: None   Procedures/Studies: DG CHEST PORT 1 VIEW  Result Date: 11/16/2022 CLINICAL DATA:  Pneumonia EXAM: PORTABLE CHEST 1 VIEW COMPARISON:  11/12/2022 FINDINGS: Suboptimal inspiratory effort with vascular crowding. Consolidation bases could represent volume loss. No pneumothorax identified. No pleural effusion. Calcified aorta. IMPRESSION: Bibasilar minimal consolidation or volume loss. Suboptimal inspiratory effort. Electronically Signed   By: Sammie Bench M.D.   On: 11/16/2022 13:56   CT HEAD WO CONTRAST (5MM)  Result Date: 11/13/2022 CLINICAL DATA:  Trauma EXAM: CT HEAD WITHOUT CONTRAST TECHNIQUE: Contiguous axial images were obtained from the base of the skull through the vertex without intravenous contrast. RADIATION DOSE REDUCTION: This exam was performed according to the departmental dose-optimization program which includes automated exposure control, adjustment of the mA and/or kV according to patient size and/or use of iterative reconstruction technique. COMPARISON:  CT Head 07/27/22 FINDINGS: Brain: No hemorrhage or extra-axial fluid collection. Redemonstrated chronic bilateral cerebellar infarcts. Unchanged size and shape of the ventricular system with disproportionate  enlargement of the bilateral temporal horns relation to the sylvian fissures. There is sequela of moderate to severe chronic microvascular ischemic change. There is no CT evidence of a new cortical infarct. Vascular: No hyperdense vessel or unexpected calcification. Skull: Normal. Negative for fracture or focal lesion. Sinuses/Orbits: No mastoid or middle ear effusion. Impacted cerumen in the left EAC. Paranasal sinuses are clear. Orbits are unremarkable. Other: None. IMPRESSION: 1. No CT evidence of intracranial injury. 2. Normal CT appearance of bilateral orbits and periorbital soft tissues. Electronically Signed   By: Marin Roberts M.D.   On: 11/13/2022 16:20   DG Chest Portable 1 View  Result Date: 11/12/2022 CLINICAL DATA:  Altered mental status, dizziness. EXAM: PORTABLE CHEST 1 VIEW COMPARISON:  07/27/2022 FINDINGS: New airspace opacity along the left hemidiaphragm, potentially in the left lower lobe and/or lingula, suspicious for pneumonia. The right lung appears clear. Cardiac and mediastinal margins appear normal. Atherosclerotic calcification of the aortic arch. Thoracic spondylosis. IMPRESSION: 1. New airspace opacity along the left hemidiaphragm, potentially in the left lower lobe and/or lingula, suspicious for pneumonia. Followup PA and lateral chest X-ray is recommended in 3-4 weeks following trial of antibiotic therapy to ensure resolution and exclude underlying malignancy. Electronically Signed  By: Van Clines M.D.   On: 11/12/2022 18:58      Subjective: Patient seen and examined at bedside today.  Hemodynamically stable for discharge.  Alert, awake but not oriented to time.  Not in any kind of distress this morning,looks comfortable.Called and discussed with son on phone about dc planning  Discharge Exam: Vitals:   11/21/22 2119 11/22/22 0752  BP: (!) 122/48 136/65  Pulse: 82 86  Resp: 16 18  Temp:  98.2 F (36.8 C)  SpO2: 99% 100%   Vitals:   11/21/22 0808 11/21/22  1643 11/21/22 2119 11/22/22 0752  BP: (!) 144/58 (!) 145/89 (!) 122/48 136/65  Pulse: 84 89 82 86  Resp: '16 16 16 18  '$ Temp: 99.8 F (37.7 C) 98.9 F (37.2 C) 97.8 F (36.6 C) 98.2 F (36.8 C)  TempSrc:  Oral Oral Oral  SpO2: 98%  99% 100%  Weight:        General: Pt is alert, awake, not in acute distress,very deconditioned,weak Cardiovascular: RRR, S1/S2 +, no rubs, no gallops Respiratory: CTA bilaterally, no wheezing, no rhonchi Abdominal: Soft, NT, ND, bowel sounds + Extremities: no edema, no cyanosis    The results of significant diagnostics from this hospitalization (including imaging, microbiology, ancillary and laboratory) are listed below for reference.     Microbiology: Recent Results (from the past 240 hour(s))  Resp panel by RT-PCR (RSV, Flu A&B, Covid) Anterior Nasal Swab     Status: None   Collection Time: 11/12/22  6:13 PM   Specimen: Anterior Nasal Swab  Result Value Ref Range Status   SARS Coronavirus 2 by RT PCR NEGATIVE NEGATIVE Final   Influenza A by PCR NEGATIVE NEGATIVE Final   Influenza B by PCR NEGATIVE NEGATIVE Final    Comment: (NOTE) The Xpert Xpress SARS-CoV-2/FLU/RSV plus assay is intended as an aid in the diagnosis of influenza from Nasopharyngeal swab specimens and should not be used as a sole basis for treatment. Nasal washings and aspirates are unacceptable for Xpert Xpress SARS-CoV-2/FLU/RSV testing.  Fact Sheet for Patients: EntrepreneurPulse.com.au  Fact Sheet for Healthcare Providers: IncredibleEmployment.be  This test is not yet approved or cleared by the [place] FDA and has been authorized for detection and/or diagnosis of SARS-CoV-2 by FDA under an Emergency Use Authorization (EUA). This EUA will remain in effect (meaning this test can be used) for the duration of the COVID-19 declaration under Section 564(b)(1) of the Act, 21 U.S.C. section 360bbb-3(b)(1), unless the authorization is  terminated or revoked.     Resp Syncytial Virus by PCR NEGATIVE NEGATIVE Final    Comment: (NOTE) Fact Sheet for Patients: EntrepreneurPulse.com.au  Fact Sheet for Healthcare Providers: IncredibleEmployment.be  This test is not yet approved or cleared by the [place] FDA and has been authorized for detection and/or diagnosis of SARS-CoV-2 by FDA under an Emergency Use Authorization (EUA). This EUA will remain in effect (meaning this test can be used) for the duration of the COVID-19 declaration under Section 564(b)(1) of the Act, 21 U.S.C. section 360bbb-3(b)(1), unless the authorization is terminated or revoked.  Performed at [facility], [place] [address] [place], [place] [address]   Urine Culture (for pregnant, neutropenic or urologic patients or patients with an indwelling urinary catheter)     Status: Abnormal   Collection Time: 11/13/22  7:30 AM   Specimen: Urine, Clean Catch  Result Value Ref Range Status   Specimen Description URINE, CLEAN CATCH  Final   Special Requests   Final  NONE Performed at [facility], [place] [address] [place], [place] [address]    Culture >=100,000 COLONIES/mL CITROBACTER KOSERI (A)  Final   Report Status 11/15/2022 FINAL  Final   Organism ID, Bacteria CITROBACTER KOSERI (A)  Final      Susceptibility   Citrobacter koseri - MIC*    CEFEPIME <=0.12 SENSITIVE Sensitive     CEFTRIAXONE <=0.25 SENSITIVE Sensitive     CIPROFLOXACIN <=0.25 SENSITIVE Sensitive     GENTAMICIN <=1 SENSITIVE Sensitive     IMIPENEM <=0.25 SENSITIVE Sensitive     NITROFURANTOIN 32 SENSITIVE Sensitive     TRIMETH/SULFA <=20 SENSITIVE Sensitive     PIP/TAZO <=4 SENSITIVE Sensitive     * >=100,000 COLONIES/mL CITROBACTER KOSERI  Culture, blood (Routine X 2) w Reflex to ID Panel     Status: None   Collection Time: 11/16/22 12:18 PM   Specimen: BLOOD RIGHT HAND  Result Value Ref Range Status   Specimen Description  BLOOD RIGHT HAND  Final   Special Requests   Final    BOTTLES DRAWN AEROBIC AND ANAEROBIC Blood Culture adequate volume   Culture   Final    NO GROWTH 6 DAYS Performed at [facility], [address], [place], [place] [address]    Report Status 11/22/2022 FINAL  Final  Culture, blood (Routine X 2) w Reflex to ID Panel     Status: None   Collection Time: 11/16/22 12:18 PM   Specimen: BLOOD RIGHT HAND  Result Value Ref Range Status   Specimen Description BLOOD RIGHT HAND  Final   Special Requests   Final    BOTTLES DRAWN AEROBIC AND ANAEROBIC Blood Culture adequate volume   Culture   Final    NO GROWTH 6 DAYS Performed at [facility], [place] [address] [place], [place] [address]    Report Status 11/22/2022 FINAL  Final     Labs: BNP (last 3 results) Recent Labs    11/13/22 0404  BNP [id number]   Basic Metabolic Panel: Recent Labs  Lab 11/18/22 0507 11/19/22 0430 11/20/22 0246 11/21/22 0414 11/22/22 0827  NA 139 137 138 138 138  K 4.3 4.2 4.6 4.5 4.5  CL 104 105 105 106 107  CO2 '24 24 22 24 24  '$ GLUCOSE 142* 151* 148* 129* 125*  BUN '17 18 17 21 '$ 25*  CREATININE 1.18 1.21 1.02 1.40* 1.37*  CALCIUM 8.2* 7.9* 8.2* 8.1* 7.9*   Liver Function Tests: Recent Labs  Lab 11/17/22 0544 11/18/22 0507 11/19/22 0430 11/20/22 0246 11/21/22 0414  AST 29 40 35 40 35  ALT '21 30 31 31 '$ 34  ALKPHOS 68 73 64 69 71  BILITOT 0.5 0.7 0.5 0.9 0.7  PROT 6.2* 6.2* 6.0* 6.3* 6.3*  ALBUMIN 2.5* 2.6* 2.4* 2.5* 2.6*   No results for input(s): "LIPASE", "AMYLASE" in the last 168 hours. No results for input(s): "AMMONIA" in the last 168 hours. CBC: Recent Labs  Lab 11/17/22 0544 11/18/22 0507 11/19/22 0430 11/20/22 0246 11/21/22 0414  WBC 12.1* 9.5 8.3 7.7 8.8  HGB 9.1* 9.0* 8.9* 9.9* 9.3*  HCT 27.5* 27.0* 26.5* 29.5* 27.8*  MCV 96.2 96.1 95.3 95.5 96.2  PLT 282 294 311 320 340   Cardiac Enzymes: No results for input(s): "CKTOTAL", "CKMB", "CKMBINDEX", "TROPONINI" in the  last 168 hours. BNP: Invalid input(s): "POCBNP" CBG: Recent Labs  Lab 11/21/22 0806 11/21/22 1142 11/21/22 1644 11/21/22 2121 11/22/22 0753  GLUCAP 110* 143* 111* 230* 114*  D-Dimer No results for input(s): "DDIMER" in the last 72 hours. Hgb A1c No results for input(s): "HGBA1C" in the last 72 hours. Lipid Profile No results for input(s): "CHOL", "HDL", "LDLCALC", "TRIG", "CHOLHDL", "LDLDIRECT" in the last 72 hours. Thyroid function studies No results for input(s): "TSH", "T4TOTAL", "T3FREE", "THYROIDAB" in the last 72 hours.  Invalid input(s): "FREET3" Anemia work up No results for input(s): "VITAMINB12", "FOLATE", "FERRITIN", "TIBC", "IRON", "RETICCTPCT" in the last 72 hours. Urinalysis    Component Value Date/Time   COLORURINE YELLOW 11/12/2022 2022   APPEARANCEUR CLOUDY (A) 11/12/2022 2022   LABSPEC 1.025 11/12/2022 2022   PHURINE 5.5 11/12/2022 2022   GLUCOSEU NEGATIVE 11/12/2022 2022   HGBUR TRACE (A) 11/12/2022 2022   BILIRUBINUR NEGATIVE 11/12/2022 2022   KETONESUR NEGATIVE 11/12/2022 2022   PROTEINUR NEGATIVE 11/12/2022 2022   UROBILINOGEN 0.2 01/06/2012 1335   NITRITE POSITIVE (A) 11/12/2022 2022   LEUKOCYTESUR MODERATE (A) 11/12/2022 2022   Sepsis Labs Recent Labs  Lab 11/18/22 0507 11/19/22 0430 11/20/22 0246 11/21/22 0414  WBC 9.5 8.3 7.7 8.8   Microbiology Recent Results (from the past 240 hour(s))  Resp panel by RT-PCR (RSV, Flu A&B, Covid) Anterior Nasal Swab     Status: None   Collection Time: 11/12/22  6:13 PM   Specimen: Anterior Nasal Swab  Result Value Ref Range Status   SARS Coronavirus 2 by RT PCR NEGATIVE NEGATIVE Final   Influenza A by PCR NEGATIVE NEGATIVE Final   Influenza B by PCR NEGATIVE NEGATIVE Final    Comment: (NOTE) The Xpert Xpress SARS-CoV-2/FLU/RSV plus assay is intended as an aid in the diagnosis of influenza from Nasopharyngeal swab specimens and should not be used as a sole basis for treatment. Nasal washings  and aspirates are unacceptable for Xpert Xpress SARS-CoV-2/FLU/RSV testing.  Fact Sheet for Patients: EntrepreneurPulse.com.au  Fact Sheet for Healthcare Providers: IncredibleEmployment.be  This test is not yet approved or cleared by the [place] FDA and has been authorized for detection and/or diagnosis of SARS-CoV-2 by FDA under an Emergency Use Authorization (EUA). This EUA will remain in effect (meaning this test can be used) for the duration of the COVID-19 declaration under Section 564(b)(1) of the Act, 21 U.S.C. section 360bbb-3(b)(1), unless the authorization is terminated or revoked.     Resp Syncytial Virus by PCR NEGATIVE NEGATIVE Final    Comment: (NOTE) Fact Sheet for Patients: EntrepreneurPulse.com.au  Fact Sheet for Healthcare Providers: IncredibleEmployment.be  This test is not yet approved or cleared by the [place] FDA and has been authorized for detection and/or diagnosis of SARS-CoV-2 by FDA under an Emergency Use Authorization (EUA). This EUA will remain in effect (meaning this test can be used) for the duration of the COVID-19 declaration under Section 564(b)(1) of the Act, 21 U.S.C. section 360bbb-3(b)(1), unless the authorization is terminated or revoked.  Performed at [facility], [place] [address] [place], [place] [address]   Urine Culture (for pregnant, neutropenic or urologic patients or patients with an indwelling urinary catheter)     Status: Abnormal   Collection Time: 11/13/22  7:30 AM   Specimen: Urine, Clean Catch  Result Value Ref Range Status   Specimen Description URINE, CLEAN CATCH  Final   Special Requests   Final    NONE Performed at [facility], [place] [address], [place], [place] [address]    Culture >=100,000 COLONIES/mL CITROBACTER KOSERI (A)  Final   Report Status 11/15/2022 FINAL  Final   Organism ID, Bacteria  CITROBACTER KOSERI (A)   Final      Susceptibility   Citrobacter koseri - MIC*    CEFEPIME <=0.12 SENSITIVE Sensitive     CEFTRIAXONE <=0.25 SENSITIVE Sensitive     CIPROFLOXACIN <=0.25 SENSITIVE Sensitive     GENTAMICIN <=1 SENSITIVE Sensitive     IMIPENEM <=0.25 SENSITIVE Sensitive     NITROFURANTOIN 32 SENSITIVE Sensitive     TRIMETH/SULFA <=20 SENSITIVE Sensitive     PIP/TAZO <=4 SENSITIVE Sensitive     * >=100,000 COLONIES/mL CITROBACTER KOSERI  Culture, blood (Routine X 2) w Reflex to ID Panel     Status: None   Collection Time: 11/16/22 12:18 PM   Specimen: BLOOD RIGHT HAND  Result Value Ref Range Status   Specimen Description BLOOD RIGHT HAND  Final   Special Requests   Final    BOTTLES DRAWN AEROBIC AND ANAEROBIC Blood Culture adequate volume   Culture   Final    NO GROWTH 6 DAYS Performed at [facility], [address] [place], [place] [address]    Report Status 11/22/2022 FINAL  Final  Culture, blood (Routine X 2) w Reflex to ID Panel     Status: None   Collection Time: 11/16/22 12:18 PM   Specimen: BLOOD RIGHT HAND  Result Value Ref Range Status   Specimen Description BLOOD RIGHT HAND  Final   Special Requests   Final    BOTTLES DRAWN AEROBIC AND ANAEROBIC Blood Culture adequate volume   Culture   Final    NO GROWTH 6 DAYS Performed at [facility], [place] [address] [place], [place] [address]    Report Status 11/22/2022 FINAL  Final    Please note: You were cared for by a hospitalist during your hospital stay. Once you are discharged, your primary care physician will handle any further medical issues. Please note that NO REFILLS for any discharge medications will be authorized once you are discharged, as it is imperative that you return to your primary care physician (or establish a relationship with a primary care physician if you do not have one) for your post hospital discharge needs so that they can reassess your need for medications and monitor your lab values.    Time  coordinating discharge: 40 minutes  SIGNED:   Shelly Coss, MD  [facility] 11/22/2022, 12:43 PM Pager [phone]  If 7PM-7AM, please contact night-coverage [email or web address] Password [email or web address]

## 2022-11-22 NOTE — Progress Notes (Signed)
Mobility Specialist - Progress Note   11/22/22 1053  Mobility  Activity Stood at bedside  Level of Assistance Moderate assist, patient does 50-74%  Assistive Device Front wheel walker;None  Activity Response Tolerated fair  Mobility Referral Yes  $Mobility charge 1 Mobility   Pt was received in bed and agreeable to mobility. Session was limited d/t pt fatigue. Pt was able to complete x2 sit to stands. Pt was returned to bed with all needs met and bed alarm on.   [name]  Mobility Specialist Please contact via Solicitor or Rehab office at [phone]

## 2022-11-23 DIAGNOSIS — R2689 Other abnormalities of gait and mobility: Secondary | ICD-10-CM | POA: Diagnosis not present

## 2022-11-23 DIAGNOSIS — E1122 Type 2 diabetes mellitus with diabetic chronic kidney disease: Secondary | ICD-10-CM | POA: Diagnosis not present

## 2022-11-23 DIAGNOSIS — N1832 Chronic kidney disease, stage 3b: Secondary | ICD-10-CM | POA: Diagnosis not present

## 2022-11-23 DIAGNOSIS — N1831 Chronic kidney disease, stage 3a: Secondary | ICD-10-CM | POA: Diagnosis not present

## 2022-11-23 DIAGNOSIS — Z79899 Other long term (current) drug therapy: Secondary | ICD-10-CM | POA: Diagnosis not present

## 2022-11-23 DIAGNOSIS — F29 Unspecified psychosis not due to a substance or known physiological condition: Secondary | ICD-10-CM | POA: Diagnosis not present

## 2022-11-23 DIAGNOSIS — G9341 Metabolic encephalopathy: Secondary | ICD-10-CM | POA: Diagnosis not present

## 2022-11-23 DIAGNOSIS — Z1152 Encounter for screening for COVID-19: Secondary | ICD-10-CM | POA: Diagnosis not present

## 2022-11-23 DIAGNOSIS — N183 Chronic kidney disease, stage 3 unspecified: Secondary | ICD-10-CM | POA: Diagnosis not present

## 2022-11-23 DIAGNOSIS — F03C18 Unspecified dementia, severe, with other behavioral disturbance: Secondary | ICD-10-CM | POA: Diagnosis not present

## 2022-11-23 DIAGNOSIS — R55 Syncope and collapse: Secondary | ICD-10-CM | POA: Diagnosis not present

## 2022-11-23 DIAGNOSIS — M25551 Pain in right hip: Secondary | ICD-10-CM | POA: Diagnosis not present

## 2022-11-23 DIAGNOSIS — Z9181 History of falling: Secondary | ICD-10-CM | POA: Diagnosis not present

## 2022-11-23 DIAGNOSIS — F4320 Adjustment disorder, unspecified: Secondary | ICD-10-CM | POA: Diagnosis not present

## 2022-11-23 DIAGNOSIS — R531 Weakness: Secondary | ICD-10-CM | POA: Diagnosis not present

## 2022-11-23 DIAGNOSIS — I129 Hypertensive chronic kidney disease with stage 1 through stage 4 chronic kidney disease, or unspecified chronic kidney disease: Secondary | ICD-10-CM | POA: Diagnosis not present

## 2022-11-23 DIAGNOSIS — J189 Pneumonia, unspecified organism: Secondary | ICD-10-CM | POA: Diagnosis not present

## 2022-11-23 DIAGNOSIS — N390 Urinary tract infection, site not specified: Secondary | ICD-10-CM | POA: Diagnosis not present

## 2022-11-23 DIAGNOSIS — R41841 Cognitive communication deficit: Secondary | ICD-10-CM | POA: Diagnosis not present

## 2022-11-23 DIAGNOSIS — J181 Lobar pneumonia, unspecified organism: Secondary | ICD-10-CM | POA: Diagnosis not present

## 2022-11-23 DIAGNOSIS — R609 Edema, unspecified: Secondary | ICD-10-CM | POA: Diagnosis not present

## 2022-11-23 DIAGNOSIS — J159 Unspecified bacterial pneumonia: Secondary | ICD-10-CM | POA: Diagnosis not present

## 2022-11-23 DIAGNOSIS — R456 Violent behavior: Secondary | ICD-10-CM | POA: Diagnosis not present

## 2022-11-23 DIAGNOSIS — R278 Other lack of coordination: Secondary | ICD-10-CM | POA: Diagnosis not present

## 2022-11-23 DIAGNOSIS — R600 Localized edema: Secondary | ICD-10-CM | POA: Diagnosis not present

## 2022-11-23 DIAGNOSIS — R1312 Dysphagia, oropharyngeal phase: Secondary | ICD-10-CM | POA: Diagnosis not present

## 2022-11-23 DIAGNOSIS — Z8673 Personal history of transient ischemic attack (TIA), and cerebral infarction without residual deficits: Secondary | ICD-10-CM | POA: Diagnosis not present

## 2022-11-23 DIAGNOSIS — M6281 Muscle weakness (generalized): Secondary | ICD-10-CM | POA: Diagnosis not present

## 2022-11-23 DIAGNOSIS — R296 Repeated falls: Secondary | ICD-10-CM | POA: Diagnosis not present

## 2022-11-23 DIAGNOSIS — J188 Other pneumonia, unspecified organism: Secondary | ICD-10-CM | POA: Diagnosis not present

## 2022-11-23 DIAGNOSIS — Z7982 Long term (current) use of aspirin: Secondary | ICD-10-CM | POA: Diagnosis not present

## 2022-11-23 DIAGNOSIS — R2243 Localized swelling, mass and lump, lower limb, bilateral: Secondary | ICD-10-CM | POA: Diagnosis present

## 2022-11-23 DIAGNOSIS — R310 Gross hematuria: Secondary | ICD-10-CM | POA: Diagnosis not present

## 2022-11-23 DIAGNOSIS — Z743 Need for continuous supervision: Secondary | ICD-10-CM | POA: Diagnosis not present

## 2022-11-23 DIAGNOSIS — D649 Anemia, unspecified: Secondary | ICD-10-CM | POA: Diagnosis not present

## 2022-11-23 DIAGNOSIS — I10 Essential (primary) hypertension: Secondary | ICD-10-CM | POA: Diagnosis not present

## 2022-11-23 DIAGNOSIS — L988 Other specified disorders of the skin and subcutaneous tissue: Secondary | ICD-10-CM | POA: Diagnosis not present

## 2022-11-23 DIAGNOSIS — F413 Other mixed anxiety disorders: Secondary | ICD-10-CM | POA: Diagnosis not present

## 2022-11-23 DIAGNOSIS — F0390 Unspecified dementia without behavioral disturbance: Secondary | ICD-10-CM | POA: Diagnosis not present

## 2022-11-23 DIAGNOSIS — R7989 Other specified abnormal findings of blood chemistry: Secondary | ICD-10-CM | POA: Diagnosis not present

## 2022-12-04 DIAGNOSIS — J181 Lobar pneumonia, unspecified organism: Secondary | ICD-10-CM | POA: Diagnosis not present

## 2022-12-04 DIAGNOSIS — N1832 Chronic kidney disease, stage 3b: Secondary | ICD-10-CM | POA: Diagnosis not present

## 2022-12-04 DIAGNOSIS — R278 Other lack of coordination: Secondary | ICD-10-CM | POA: Diagnosis not present

## 2022-12-04 DIAGNOSIS — Z9181 History of falling: Secondary | ICD-10-CM | POA: Diagnosis not present

## 2022-12-04 DIAGNOSIS — I129 Hypertensive chronic kidney disease with stage 1 through stage 4 chronic kidney disease, or unspecified chronic kidney disease: Secondary | ICD-10-CM | POA: Diagnosis not present

## 2022-12-04 DIAGNOSIS — E1122 Type 2 diabetes mellitus with diabetic chronic kidney disease: Secondary | ICD-10-CM | POA: Diagnosis not present

## 2022-12-04 DIAGNOSIS — M6281 Muscle weakness (generalized): Secondary | ICD-10-CM | POA: Diagnosis not present

## 2022-12-04 DIAGNOSIS — F0390 Unspecified dementia without behavioral disturbance: Secondary | ICD-10-CM | POA: Diagnosis not present

## 2022-12-04 DIAGNOSIS — R2689 Other abnormalities of gait and mobility: Secondary | ICD-10-CM | POA: Diagnosis not present

## 2022-12-07 DIAGNOSIS — Z9181 History of falling: Secondary | ICD-10-CM | POA: Diagnosis not present

## 2022-12-07 DIAGNOSIS — R2689 Other abnormalities of gait and mobility: Secondary | ICD-10-CM | POA: Diagnosis not present

## 2022-12-07 DIAGNOSIS — R278 Other lack of coordination: Secondary | ICD-10-CM | POA: Diagnosis not present

## 2022-12-07 DIAGNOSIS — J181 Lobar pneumonia, unspecified organism: Secondary | ICD-10-CM | POA: Diagnosis not present

## 2022-12-07 DIAGNOSIS — M6281 Muscle weakness (generalized): Secondary | ICD-10-CM | POA: Diagnosis not present

## 2022-12-11 DIAGNOSIS — M6281 Muscle weakness (generalized): Secondary | ICD-10-CM | POA: Diagnosis not present

## 2022-12-11 DIAGNOSIS — Z9181 History of falling: Secondary | ICD-10-CM | POA: Diagnosis not present

## 2022-12-11 DIAGNOSIS — R278 Other lack of coordination: Secondary | ICD-10-CM | POA: Diagnosis not present

## 2022-12-11 DIAGNOSIS — J181 Lobar pneumonia, unspecified organism: Secondary | ICD-10-CM | POA: Diagnosis not present

## 2022-12-11 DIAGNOSIS — R2689 Other abnormalities of gait and mobility: Secondary | ICD-10-CM | POA: Diagnosis not present

## 2022-12-14 DIAGNOSIS — J181 Lobar pneumonia, unspecified organism: Secondary | ICD-10-CM | POA: Diagnosis not present

## 2022-12-14 DIAGNOSIS — R278 Other lack of coordination: Secondary | ICD-10-CM | POA: Diagnosis not present

## 2022-12-14 DIAGNOSIS — Z9181 History of falling: Secondary | ICD-10-CM | POA: Diagnosis not present

## 2022-12-14 DIAGNOSIS — M6281 Muscle weakness (generalized): Secondary | ICD-10-CM | POA: Diagnosis not present

## 2022-12-14 DIAGNOSIS — R2689 Other abnormalities of gait and mobility: Secondary | ICD-10-CM | POA: Diagnosis not present

## 2022-12-18 DIAGNOSIS — R2689 Other abnormalities of gait and mobility: Secondary | ICD-10-CM | POA: Diagnosis not present

## 2022-12-18 DIAGNOSIS — Z9181 History of falling: Secondary | ICD-10-CM | POA: Diagnosis not present

## 2022-12-18 DIAGNOSIS — R278 Other lack of coordination: Secondary | ICD-10-CM | POA: Diagnosis not present

## 2022-12-18 DIAGNOSIS — J181 Lobar pneumonia, unspecified organism: Secondary | ICD-10-CM | POA: Diagnosis not present

## 2022-12-18 DIAGNOSIS — M6281 Muscle weakness (generalized): Secondary | ICD-10-CM | POA: Diagnosis not present

## 2022-12-21 DIAGNOSIS — R2689 Other abnormalities of gait and mobility: Secondary | ICD-10-CM | POA: Diagnosis not present

## 2022-12-21 DIAGNOSIS — R278 Other lack of coordination: Secondary | ICD-10-CM | POA: Diagnosis not present

## 2022-12-21 DIAGNOSIS — J181 Lobar pneumonia, unspecified organism: Secondary | ICD-10-CM | POA: Diagnosis not present

## 2022-12-21 DIAGNOSIS — Z9181 History of falling: Secondary | ICD-10-CM | POA: Diagnosis not present

## 2022-12-21 DIAGNOSIS — M6281 Muscle weakness (generalized): Secondary | ICD-10-CM | POA: Diagnosis not present

## 2022-12-24 DIAGNOSIS — M6281 Muscle weakness (generalized): Secondary | ICD-10-CM | POA: Diagnosis not present

## 2022-12-24 DIAGNOSIS — E1122 Type 2 diabetes mellitus with diabetic chronic kidney disease: Secondary | ICD-10-CM | POA: Diagnosis not present

## 2022-12-24 DIAGNOSIS — N1831 Chronic kidney disease, stage 3a: Secondary | ICD-10-CM | POA: Diagnosis not present

## 2022-12-24 DIAGNOSIS — G9341 Metabolic encephalopathy: Secondary | ICD-10-CM | POA: Diagnosis not present

## 2022-12-24 DIAGNOSIS — N390 Urinary tract infection, site not specified: Secondary | ICD-10-CM | POA: Diagnosis not present

## 2022-12-24 DIAGNOSIS — R296 Repeated falls: Secondary | ICD-10-CM | POA: Diagnosis not present

## 2022-12-24 DIAGNOSIS — R55 Syncope and collapse: Secondary | ICD-10-CM | POA: Diagnosis not present

## 2022-12-24 DIAGNOSIS — J189 Pneumonia, unspecified organism: Secondary | ICD-10-CM | POA: Diagnosis not present

## 2022-12-24 DIAGNOSIS — D649 Anemia, unspecified: Secondary | ICD-10-CM | POA: Diagnosis not present

## 2022-12-25 DIAGNOSIS — J181 Lobar pneumonia, unspecified organism: Secondary | ICD-10-CM | POA: Diagnosis not present

## 2022-12-25 DIAGNOSIS — R2689 Other abnormalities of gait and mobility: Secondary | ICD-10-CM | POA: Diagnosis not present

## 2022-12-25 DIAGNOSIS — M6281 Muscle weakness (generalized): Secondary | ICD-10-CM | POA: Diagnosis not present

## 2022-12-25 DIAGNOSIS — Z9181 History of falling: Secondary | ICD-10-CM | POA: Diagnosis not present

## 2022-12-25 DIAGNOSIS — R278 Other lack of coordination: Secondary | ICD-10-CM | POA: Diagnosis not present

## 2022-12-28 DIAGNOSIS — M6281 Muscle weakness (generalized): Secondary | ICD-10-CM | POA: Diagnosis not present

## 2022-12-28 DIAGNOSIS — Z9181 History of falling: Secondary | ICD-10-CM | POA: Diagnosis not present

## 2022-12-28 DIAGNOSIS — R2689 Other abnormalities of gait and mobility: Secondary | ICD-10-CM | POA: Diagnosis not present

## 2022-12-28 DIAGNOSIS — R278 Other lack of coordination: Secondary | ICD-10-CM | POA: Diagnosis not present

## 2022-12-28 DIAGNOSIS — J181 Lobar pneumonia, unspecified organism: Secondary | ICD-10-CM | POA: Diagnosis not present

## 2022-12-31 DIAGNOSIS — R296 Repeated falls: Secondary | ICD-10-CM | POA: Diagnosis not present

## 2023-01-01 DIAGNOSIS — J181 Lobar pneumonia, unspecified organism: Secondary | ICD-10-CM | POA: Diagnosis not present

## 2023-01-01 DIAGNOSIS — R2689 Other abnormalities of gait and mobility: Secondary | ICD-10-CM | POA: Diagnosis not present

## 2023-01-01 DIAGNOSIS — I10 Essential (primary) hypertension: Secondary | ICD-10-CM | POA: Diagnosis not present

## 2023-01-01 DIAGNOSIS — R278 Other lack of coordination: Secondary | ICD-10-CM | POA: Diagnosis not present

## 2023-01-01 DIAGNOSIS — N1831 Chronic kidney disease, stage 3a: Secondary | ICD-10-CM | POA: Diagnosis not present

## 2023-01-01 DIAGNOSIS — M6281 Muscle weakness (generalized): Secondary | ICD-10-CM | POA: Diagnosis not present

## 2023-01-01 DIAGNOSIS — Z9181 History of falling: Secondary | ICD-10-CM | POA: Diagnosis not present

## 2023-01-03 DIAGNOSIS — R609 Edema, unspecified: Secondary | ICD-10-CM | POA: Diagnosis not present

## 2023-01-04 DIAGNOSIS — Z9181 History of falling: Secondary | ICD-10-CM | POA: Diagnosis not present

## 2023-01-04 DIAGNOSIS — R609 Edema, unspecified: Secondary | ICD-10-CM | POA: Diagnosis not present

## 2023-01-04 DIAGNOSIS — J181 Lobar pneumonia, unspecified organism: Secondary | ICD-10-CM | POA: Diagnosis not present

## 2023-01-04 DIAGNOSIS — R278 Other lack of coordination: Secondary | ICD-10-CM | POA: Diagnosis not present

## 2023-01-04 DIAGNOSIS — R2689 Other abnormalities of gait and mobility: Secondary | ICD-10-CM | POA: Diagnosis not present

## 2023-01-04 DIAGNOSIS — M6281 Muscle weakness (generalized): Secondary | ICD-10-CM | POA: Diagnosis not present

## 2023-01-08 ENCOUNTER — Emergency Department (HOSPITAL_COMMUNITY)
Admission: EM | Admit: 2023-01-08 | Discharge: 2023-01-09 | Disposition: A | Discharge status: Home / Self Care | Payer: Medicare PPO | Attending: Emergency Medicine | Admitting: Emergency Medicine

## 2023-01-08 ENCOUNTER — Emergency Department (HOSPITAL_COMMUNITY): Payer: Medicare PPO

## 2023-01-08 ENCOUNTER — Emergency Department (HOSPITAL_BASED_OUTPATIENT_CLINIC_OR_DEPARTMENT_OTHER): Payer: Medicare PPO

## 2023-01-08 ENCOUNTER — Other Ambulatory Visit: Payer: Self-pay

## 2023-01-08 DIAGNOSIS — J189 Pneumonia, unspecified organism: Secondary | ICD-10-CM

## 2023-01-08 DIAGNOSIS — R2243 Localized swelling, mass and lump, lower limb, bilateral: Secondary | ICD-10-CM | POA: Diagnosis present

## 2023-01-08 DIAGNOSIS — I129 Hypertensive chronic kidney disease with stage 1 through stage 4 chronic kidney disease, or unspecified chronic kidney disease: Secondary | ICD-10-CM | POA: Insufficient documentation

## 2023-01-08 DIAGNOSIS — R609 Edema, unspecified: Secondary | ICD-10-CM | POA: Diagnosis not present

## 2023-01-08 DIAGNOSIS — Z79899 Other long term (current) drug therapy: Secondary | ICD-10-CM | POA: Insufficient documentation

## 2023-01-08 DIAGNOSIS — E538 Deficiency of other specified B group vitamins: Secondary | ICD-10-CM | POA: Insufficient documentation

## 2023-01-08 DIAGNOSIS — Z1152 Encounter for screening for COVID-19: Secondary | ICD-10-CM | POA: Insufficient documentation

## 2023-01-08 DIAGNOSIS — R7989 Other specified abnormal findings of blood chemistry: Secondary | ICD-10-CM | POA: Diagnosis not present

## 2023-01-08 DIAGNOSIS — Z8673 Personal history of transient ischemic attack (TIA), and cerebral infarction without residual deficits: Secondary | ICD-10-CM | POA: Insufficient documentation

## 2023-01-08 DIAGNOSIS — R600 Localized edema: Secondary | ICD-10-CM | POA: Diagnosis not present

## 2023-01-08 DIAGNOSIS — D510 Vitamin B12 deficiency anemia due to intrinsic factor deficiency: Secondary | ICD-10-CM | POA: Insufficient documentation

## 2023-01-08 DIAGNOSIS — R310 Gross hematuria: Secondary | ICD-10-CM | POA: Insufficient documentation

## 2023-01-08 DIAGNOSIS — N183 Chronic kidney disease, stage 3 unspecified: Secondary | ICD-10-CM | POA: Insufficient documentation

## 2023-01-08 DIAGNOSIS — F0390 Unspecified dementia without behavioral disturbance: Secondary | ICD-10-CM | POA: Insufficient documentation

## 2023-01-08 DIAGNOSIS — E1122 Type 2 diabetes mellitus with diabetic chronic kidney disease: Secondary | ICD-10-CM | POA: Diagnosis not present

## 2023-01-08 DIAGNOSIS — J188 Other pneumonia, unspecified organism: Secondary | ICD-10-CM | POA: Insufficient documentation

## 2023-01-08 DIAGNOSIS — Z7982 Long term (current) use of aspirin: Secondary | ICD-10-CM | POA: Insufficient documentation

## 2023-01-08 DIAGNOSIS — I10 Essential (primary) hypertension: Secondary | ICD-10-CM | POA: Diagnosis not present

## 2023-01-08 DIAGNOSIS — I739 Peripheral vascular disease, unspecified: Secondary | ICD-10-CM | POA: Insufficient documentation

## 2023-01-08 LAB — RESP PANEL BY RT-PCR (RSV, FLU A&B, COVID)  RVPGX2
Influenza A by PCR: NEGATIVE
Influenza B by PCR: NEGATIVE
Resp Syncytial Virus by PCR: NEGATIVE
SARS Coronavirus 2 by RT PCR: NEGATIVE

## 2023-01-08 LAB — URINALYSIS, W/ REFLEX TO CULTURE (INFECTION SUSPECTED)
Bacteria, UA: NONE SEEN
Bilirubin Urine: NEGATIVE
Glucose, UA: NEGATIVE mg/dL
Ketones, ur: NEGATIVE mg/dL
Leukocytes,Ua: NEGATIVE
Nitrite: NEGATIVE
Protein, ur: NEGATIVE mg/dL
Specific Gravity, Urine: 1.014 (ref 1.005–1.030)
pH: 7 (ref 5.0–8.0)

## 2023-01-08 LAB — CBC WITH DIFFERENTIAL/PLATELET
Abs Immature Granulocytes: 0.09 10*3/uL — ABNORMAL HIGH (ref 0.00–0.07)
Basophils Absolute: 0.1 10*3/uL (ref 0.0–0.1)
Basophils Relative: 1 %
Eosinophils Absolute: 0.2 10*3/uL (ref 0.0–0.5)
Eosinophils Relative: 2 %
HCT: 30 % — ABNORMAL LOW (ref 39.0–52.0)
Hemoglobin: 9.2 g/dL — ABNORMAL LOW (ref 13.0–17.0)
Immature Granulocytes: 1 %
Lymphocytes Relative: 24 %
Lymphs Abs: 2.2 10*3/uL (ref 0.7–4.0)
MCH: 30.4 pg (ref 26.0–34.0)
MCHC: 30.7 g/dL (ref 30.0–36.0)
MCV: 99 fL (ref 80.0–100.0)
Monocytes Absolute: 0.9 10*3/uL (ref 0.1–1.0)
Monocytes Relative: 10 %
Neutro Abs: 5.8 10*3/uL (ref 1.7–7.7)
Neutrophils Relative %: 62 %
Platelets: 304 10*3/uL (ref 150–400)
RBC: 3.03 MIL/uL — ABNORMAL LOW (ref 4.22–5.81)
RDW: 15.9 % — ABNORMAL HIGH (ref 11.5–15.5)
WBC: 9.3 10*3/uL (ref 4.0–10.5)
nRBC: 0 % (ref 0.0–0.2)

## 2023-01-08 LAB — COMPREHENSIVE METABOLIC PANEL
ALT: 20 U/L (ref 0–44)
AST: 14 U/L — ABNORMAL LOW (ref 15–41)
Albumin: 2.7 g/dL — ABNORMAL LOW (ref 3.5–5.0)
Alkaline Phosphatase: 79 U/L (ref 38–126)
Anion gap: 4 — ABNORMAL LOW (ref 5–15)
BUN: 23 mg/dL (ref 8–23)
CO2: 25 mmol/L (ref 22–32)
Calcium: 8.2 mg/dL — ABNORMAL LOW (ref 8.9–10.3)
Chloride: 110 mmol/L (ref 98–111)
Creatinine, Ser: 1.07 mg/dL (ref 0.61–1.24)
GFR, Estimated: >60 mL/min (ref >60)
Glucose, Bld: 134 mg/dL — ABNORMAL HIGH (ref 70–99)
Potassium: 4 mmol/L (ref 3.5–5.1)
Sodium: 139 mmol/L (ref 135–145)
Total Bilirubin: 0.4 mg/dL (ref 0.3–1.2)
Total Protein: 7 g/dL (ref 6.5–8.1)

## 2023-01-08 LAB — D-DIMER, QUANTITATIVE: D-Dimer, Quant: >20 ug/mL-FEU — ABNORMAL HIGH (ref 0.00–0.50)

## 2023-01-08 LAB — BRAIN NATRIURETIC PEPTIDE: B Natriuretic Peptide: 121.6 pg/mL — ABNORMAL HIGH (ref 0.0–100.0)

## 2023-01-08 MED ORDER — AMOXICILLIN-POT CLAVULANATE 875-125 MG PO TABS: 1.0000 | ORAL_TABLET | Freq: Two times a day (BID) | ORAL | 0 refills | Status: DC

## 2023-01-08 MED ORDER — IOHEXOL 350 MG/ML SOLN
75.0000 mL | Freq: Once | INTRAVENOUS | Status: AC | PRN
  Administered 2023-01-08: 75 mL via INTRAVENOUS

## 2023-01-08 MED ORDER — AMOXICILLIN-POT CLAVULANATE 875-125 MG PO TABS
1.0000 | ORAL_TABLET | Freq: Once | ORAL | Status: DC
  Filled 2023-01-08: qty 1

## 2023-01-08 MED ORDER — AZITHROMYCIN 250 MG PO TABS: 250.0000 mg | ORAL_TABLET | Freq: Every day | ORAL | 0 refills | Status: DC

## 2023-01-08 MED ORDER — FUROSEMIDE 10 MG/ML IJ SOLN
40.0000 mg | Freq: Once | INTRAMUSCULAR | Status: AC
  Administered 2023-01-08: 40 mg via INTRAVENOUS
  Filled 2023-01-08: qty 4

## 2023-01-08 MED ORDER — AZITHROMYCIN 250 MG PO TABS
500.0000 mg | ORAL_TABLET | Freq: Once | ORAL | Status: DC
  Filled 2023-01-08: qty 2

## 2023-01-08 MED ORDER — QUETIAPINE FUMARATE 50 MG PO TABS
25.0000 mg | ORAL_TABLET | Freq: Once | ORAL | Status: AC
  Administered 2023-01-08: 25 mg via ORAL
  Filled 2023-01-08: qty 1

## 2023-01-08 NOTE — ED Notes (Signed)
[facility] called for transport back to facility 

## 2023-01-08 NOTE — ED Provider Notes (Signed)
Care transferred to me.  He was a little agitated so was given some Seroquel though this goes along with his known dementia.  D-dimer is significantly elevated at over 20 and DVT ultrasound while limited was negative but a CTA was obtained.  This does not show PE but does show some pneumonia.  His pulse ox has been normal here and he has not had any significant increased work of breathing during my exams.  Discussed with daughter, Armand Preast, and she prefers to send him back to the facility with antibiotics.  Given he is not septic, normal WBC, normal oxygenation, I think this is pretty reasonable.  He can follow-up with the facility physician in the next couple days.  Will send antibiotics with him via his discharge paperwork and give him a dose tonight.  Otherwise, will give return precautions.  Of note he has some blood in his urine and there was some concern about blood at his urethral meatus but no UTI.  Lower extremity swelling has actually been for a very long time according to the daughter and I do not think this needs to be emergently treated.   Pricilla Loveless, MD 01/08/23 2129

## 2023-01-08 NOTE — Discharge Instructions (Addendum)
There is some blood in the urine but no urinary tract infection today.  A CT scan showed some pneumonia in your lungs and you are being put on antibiotics to cover this.  You were given the first dose here in the emergency department.  If you develop fever, coughing up blood, new confusion, or any other new/concerning symptoms then return to the ER or call 911.

## 2023-01-08 NOTE — ED Triage Notes (Signed)
Pt BIBA from [facility] w/ c/o bilateral lower leg edema x 1 week. Lasix increased po without improvement. Unable to perform blood work. Hx of dementia, A&O x1.   BP 160/80 HR 76 RR 18 100% RA  CBG 150

## 2023-01-08 NOTE — ED Provider Notes (Signed)
[facility] AT [facility] Provider Note   CSN: [id number] Arrival date & time: 01/08/23  1210     History {Add pertinent medical, surgical, social history, OB history to HPI:1} No chief complaint on file.   Shane Rising Sr. is a 89 y.o. male with dementia, HTN, CKD stage 3, h/o TIA/CVA, T2DM, cognitive impairment, HLD, PAD, pernicious anemia/B12 deficiency who presents with leg swelling.  Patient presents from facility [facility] w/ c/o bilateral lower leg edema x 1 week. History obtained by EMS, as patient has baseline dementia and is A&Ox1 at his baseline. Reportedly, his lasix increased po without improvement in the swelling, and the facility was unable to perform blood work.   Patient responds with his name but doesn't respond to any other questions. No family at bedside for history.   Per chart review patient was admitted from 11/12/2022 to 11/22/2022 for fall syncope, left lower lobe pneumonia likely from aspiration, metabolic encephalopathy.  HPI     Home Medications Prior to Admission medications   Medication Sig Start Date End Date Taking? Authorizing Provider  acetaminophen (TYLENOL) 325 MG tablet Take 2 tablets (650 mg total) by mouth every 6 (six) hours as needed for mild pain (or Fever >/= 101). 11/22/22   Burnadette Pop, MD  amLODipine (NORVASC) 10 MG tablet Take 10 mg by mouth daily. 07/21/18   [provider]  aspirin 81 MG tablet Take 81 mg by mouth daily.    [provider]  atorvastatin (LIPITOR) 20 MG tablet Take 20 mg by mouth every evening. 06/20/18   [provider]  feeding supplement (ENSURE ENLIVE / ENSURE PLUS) LIQD Take 237 mLs by mouth 2 (two) times daily between meals. 11/22/22   Burnadette Pop, MD  multivitamin (RENA-VIT) TABS tablet Take 1 tablet by mouth at bedtime. 11/22/22   Burnadette Pop, MD  QUEtiapine (SEROQUEL) 25 MG tablet Take 1 tablet (25 mg total) by mouth at bedtime. 11/22/22    Burnadette Pop, MD      Allergies    Losartan potassium and Nsaids    Review of Systems   Review of Systems  Unable to perform ROS: Dementia    Physical Exam Updated Vital Signs BP 134/61 (BP Location: Right Arm)   Pulse 90   Temp 97.6 F (36.4 C) (Oral)   Resp (!) 21   SpO2 100%  Physical Exam General: Elderly chronically ill-\ appearing male, lying in bed.  HEENT: PERRLA, Sclera anicteric, MMM, trachea midline.  Cardiology: RRR, no murmurs/rubs/gallops. BL radial and DP pulses equal bilaterally.  Resp: Normal respiratory rate and effort. CTAB, no wheezes, rhonchi, crackles.  Abd: Soft, non-tender, non-distended. No rebound tenderness or guarding.  GU: No blood at urethral meatus, no signs of trauma. MSK: 3+ pitting edema up to knees bilaterally, symmetric. No erythema/induration, fluctuance. No wounds or signs of trauma. Extremities without deformity or TTP. No cyanosis or clubbing. Skin: warm, dry. No rashes or lesions. Back: No notable CVA tenderness Neuro: A&Ox1, responds only when asked his name, CNs II-XII grossly intact. MAEs. Sensation grossly intact.   ED Results / Procedures / Treatments   Labs (all labs ordered are listed, but only abnormal results are displayed) Labs Reviewed  CBC WITH DIFFERENTIAL/PLATELET - Abnormal; Notable for the following components:      Result Value   RBC 3.03 (*)    Hemoglobin 9.2 (*)    HCT 30.0 (*)    RDW 15.9 (*)    Abs Immature Granulocytes  0.09 (*)    All other components within normal limits  COMPREHENSIVE METABOLIC PANEL - Abnormal; Notable for the following components:   Glucose, Bld 134 (*)    Calcium 8.2 (*)    Albumin 2.7 (*)    AST 14 (*)    Anion gap 4 (*)    All other components within normal limits  BRAIN NATRIURETIC PEPTIDE - Abnormal; Notable for the following components:   B Natriuretic Peptide 121.6 (*)    All other components within normal limits  RESP PANEL BY RT-PCR (RSV, FLU A&B, COVID)  [id number]   D-DIMER, QUANTITATIVE  URINALYSIS, W/ REFLEX TO CULTURE (INFECTION SUSPECTED)    EKG None  Radiology No results found.  Procedures Procedures  {Document cardiac monitor, telemetry assessment procedure when appropriate:1}  Medications Ordered in ED Medications  furosemide (LASIX) injection 40 mg (has no administration in time range)    ED Course/ Medical Decision Making/ A&P                          Medical Decision Making Amount and/or Complexity of Data Reviewed Labs: ordered. Decision-making details documented in ED Course.  Risk Prescription drug management.    This patient presents to the ED for concern of leg swelling, this involves an extensive number of treatment options, and is a complaint that carries with it a high risk of complications and morbidity.  I considered the following differential and admission for this acute, potentially life threatening condition.   MDM:    For patient's bilateral lower extremity edema, consider underlying heart failure.  Patient's last echo in 2019 demonstrated normal systolic function however that was 5 years ago.  Labs today demonstrate a mildly elevated BNP at 121.6 with no prior for comparison.  Consider possible heart failure component to patient's symptoms, will need to have a follow-up outpatient echocardiogram.  Reporteldy the lasix was started by the facility physician but I have no documentation of that or when.  No notable electrolyte derangements or renal injury fortunatel here today. As listed below after history obtained from facility noted also he may have had a fever earlier this week as well as fatigue and possibly blood from his penis.  Consider UTI will obtain his urine.  He is afebrile and hemodynamically stable here today, no concern for acute bacteremia or sepsis.  He also was found to have no leukocytosis.  Consider a viral syndrome that could contribute to his fatigue, EKG without any signs of arrhythmia or  ischemia.   Clinical Course as of 01/08/23 1715  Tue Jan 08, 2023  1427 Hemoglobin(!): 9.2 At baseline [HN]  1455 Comprehensive metabolic panel(!) Electrolytes okay, K wnl [HN]  1455 WBC: 9.3 No leukocytosis [HN]  1455 B Natriuretic Peptide(!): 121.6 Mild elevation, no prior for comparison. Patient likely does have HF component with leg swelling/pitting edema/elevated BNP. [HN]  1615 Called [facility] facility. D/w RN who stated he has had pitting edema in BL LEs. Has also had increased fatigue, decreased PO intake, and combativeness. Also noted blood from his penis this morning but he is combative so she couldn't collect his urine. He is normally intermittently combative. Did have a fever earlier this week, 101.79F. No trauma/falls. They could not get his labs so sent him here. [HN]  1619 Resp viral panel and UA ordered. [HN]  1705 D/w daughter and son at bedside who were unaware of what exactly was going on that brought him to ED. Will  give additional dose of lasix here IV to encourage decrease of LEE. Patient has no gross blood at penis and no blood observed in urine, UA pending.  [HN]    Clinical Course User Index [HN] Loetta Rough, MD    Labs: I Ordered, and personally interpreted labs.  The pertinent results include:  those listed above  Additional history obtained from facility, family, chart review.    Reevaluation: After the interventions noted above, I reevaluated the patient and found that they have :stayed the same  Social Determinants of Health: Patient lives at facility  Disposition:  Patient is signed out to the oncoming ED physician who is made aware of her history, presentation, exam, workup, and plan.     Co morbidities that complicate the patient evaluation  Past Medical History:  Diagnosis Date   B12 deficiency    Bilateral inguinal hernia    CKD (chronic kidney disease)    Cognitive impairment    Diabetes mellitus without complication (HCC)     Hyperlipidemia    Hypertension    PAD (peripheral artery disease) (HCC)    Stroke (HCC)      Medicines Meds ordered this encounter  Medications   furosemide (LASIX) injection 40 mg    I have reviewed the patients home medicines and have made adjustments as needed  Problem List / ED Course: Problem List Items Addressed This Visit   None Visit Diagnoses     Bilateral lower extremity edema    -  Primary            {Document critical care time when appropriate:1} {Document review of labs and clinical decision tools ie heart score, Chads2Vasc2 etc:1}  {Document your independent review of radiology images, and any outside records:1} {Document your discussion with family members, caretakers, and with consultants:1} {Document social determinants of health affecting pt's care:1} {Document your decision making why or why not admission, treatments were needed:1}  This note was created using dictation software, which may contain spelling or grammatical errors.

## 2023-01-08 NOTE — Progress Notes (Signed)
BLE venous duplex has been completed.  Preliminary results given to Dr. Criss Alvine.   Results can be found under chart review under CV PROC. 01/08/2023 7:06 PM Swara Donze RVT, RDMS

## 2023-01-09 DIAGNOSIS — Z9181 History of falling: Secondary | ICD-10-CM | POA: Diagnosis not present

## 2023-01-09 DIAGNOSIS — R278 Other lack of coordination: Secondary | ICD-10-CM | POA: Diagnosis not present

## 2023-01-09 DIAGNOSIS — M6281 Muscle weakness (generalized): Secondary | ICD-10-CM | POA: Diagnosis not present

## 2023-01-09 DIAGNOSIS — J181 Lobar pneumonia, unspecified organism: Secondary | ICD-10-CM | POA: Diagnosis not present

## 2023-01-09 DIAGNOSIS — R2689 Other abnormalities of gait and mobility: Secondary | ICD-10-CM | POA: Diagnosis not present

## 2023-01-10 DIAGNOSIS — R609 Edema, unspecified: Secondary | ICD-10-CM | POA: Diagnosis not present

## 2023-01-10 DIAGNOSIS — J159 Unspecified bacterial pneumonia: Secondary | ICD-10-CM | POA: Diagnosis not present

## 2023-01-15 DIAGNOSIS — R278 Other lack of coordination: Secondary | ICD-10-CM | POA: Diagnosis not present

## 2023-01-15 DIAGNOSIS — R296 Repeated falls: Secondary | ICD-10-CM | POA: Diagnosis not present

## 2023-01-15 DIAGNOSIS — J181 Lobar pneumonia, unspecified organism: Secondary | ICD-10-CM | POA: Diagnosis not present

## 2023-01-15 DIAGNOSIS — M6281 Muscle weakness (generalized): Secondary | ICD-10-CM | POA: Diagnosis not present

## 2023-01-15 DIAGNOSIS — L988 Other specified disorders of the skin and subcutaneous tissue: Secondary | ICD-10-CM | POA: Diagnosis not present

## 2023-01-15 DIAGNOSIS — Z9181 History of falling: Secondary | ICD-10-CM | POA: Diagnosis not present

## 2023-01-15 DIAGNOSIS — R2689 Other abnormalities of gait and mobility: Secondary | ICD-10-CM | POA: Diagnosis not present

## 2023-01-21 DIAGNOSIS — R296 Repeated falls: Secondary | ICD-10-CM | POA: Diagnosis not present

## 2023-01-24 DIAGNOSIS — R609 Edema, unspecified: Secondary | ICD-10-CM | POA: Diagnosis not present

## 2023-01-24 DIAGNOSIS — M6281 Muscle weakness (generalized): Secondary | ICD-10-CM | POA: Diagnosis not present

## 2023-01-24 DIAGNOSIS — N1831 Chronic kidney disease, stage 3a: Secondary | ICD-10-CM | POA: Diagnosis not present

## 2023-01-24 DIAGNOSIS — J189 Pneumonia, unspecified organism: Secondary | ICD-10-CM | POA: Diagnosis not present

## 2023-01-24 DIAGNOSIS — J159 Unspecified bacterial pneumonia: Secondary | ICD-10-CM | POA: Diagnosis not present

## 2023-01-24 DIAGNOSIS — E1122 Type 2 diabetes mellitus with diabetic chronic kidney disease: Secondary | ICD-10-CM | POA: Diagnosis not present

## 2023-01-24 DIAGNOSIS — N390 Urinary tract infection, site not specified: Secondary | ICD-10-CM | POA: Diagnosis not present

## 2023-01-24 DIAGNOSIS — G9341 Metabolic encephalopathy: Secondary | ICD-10-CM | POA: Diagnosis not present

## 2023-01-24 DIAGNOSIS — I10 Essential (primary) hypertension: Secondary | ICD-10-CM | POA: Diagnosis not present

## 2023-01-29 DIAGNOSIS — E785 Hyperlipidemia, unspecified: Secondary | ICD-10-CM | POA: Diagnosis not present

## 2023-01-29 DIAGNOSIS — N1831 Chronic kidney disease, stage 3a: Secondary | ICD-10-CM | POA: Diagnosis not present

## 2023-01-29 DIAGNOSIS — M6281 Muscle weakness (generalized): Secondary | ICD-10-CM | POA: Diagnosis not present

## 2023-01-29 DIAGNOSIS — I10 Essential (primary) hypertension: Secondary | ICD-10-CM | POA: Diagnosis not present

## 2023-01-31 DIAGNOSIS — Z0000 Encounter for general adult medical examination without abnormal findings: Secondary | ICD-10-CM | POA: Diagnosis not present

## 2023-02-11 DIAGNOSIS — S50312A Abrasion of left elbow, initial encounter: Secondary | ICD-10-CM | POA: Diagnosis not present

## 2023-02-11 DIAGNOSIS — R296 Repeated falls: Secondary | ICD-10-CM | POA: Diagnosis not present

## 2023-02-12 DIAGNOSIS — F03C18 Unspecified dementia, severe, with other behavioral disturbance: Secondary | ICD-10-CM | POA: Diagnosis not present

## 2023-02-12 DIAGNOSIS — F39 Unspecified mood [affective] disorder: Secondary | ICD-10-CM | POA: Diagnosis not present

## 2023-02-12 DIAGNOSIS — F413 Other mixed anxiety disorders: Secondary | ICD-10-CM | POA: Diagnosis not present

## 2023-02-25 DIAGNOSIS — R41841 Cognitive communication deficit: Secondary | ICD-10-CM | POA: Diagnosis not present

## 2023-02-25 DIAGNOSIS — R1312 Dysphagia, oropharyngeal phase: Secondary | ICD-10-CM | POA: Diagnosis not present

## 2023-02-25 DIAGNOSIS — R278 Other lack of coordination: Secondary | ICD-10-CM | POA: Diagnosis not present

## 2023-02-26 DIAGNOSIS — E1142 Type 2 diabetes mellitus with diabetic polyneuropathy: Secondary | ICD-10-CM | POA: Diagnosis not present

## 2023-02-26 DIAGNOSIS — I10 Essential (primary) hypertension: Secondary | ICD-10-CM | POA: Diagnosis not present

## 2023-02-26 DIAGNOSIS — I739 Peripheral vascular disease, unspecified: Secondary | ICD-10-CM | POA: Diagnosis not present

## 2023-02-26 DIAGNOSIS — M6281 Muscle weakness (generalized): Secondary | ICD-10-CM | POA: Diagnosis not present

## 2023-02-27 DIAGNOSIS — M6281 Muscle weakness (generalized): Secondary | ICD-10-CM | POA: Diagnosis not present

## 2023-02-27 DIAGNOSIS — R609 Edema, unspecified: Secondary | ICD-10-CM | POA: Diagnosis not present

## 2023-02-27 DIAGNOSIS — J189 Pneumonia, unspecified organism: Secondary | ICD-10-CM | POA: Diagnosis not present

## 2023-02-27 DIAGNOSIS — N390 Urinary tract infection, site not specified: Secondary | ICD-10-CM | POA: Diagnosis not present

## 2023-02-27 DIAGNOSIS — E1122 Type 2 diabetes mellitus with diabetic chronic kidney disease: Secondary | ICD-10-CM | POA: Diagnosis not present

## 2023-02-27 DIAGNOSIS — I10 Essential (primary) hypertension: Secondary | ICD-10-CM | POA: Diagnosis not present

## 2023-02-27 DIAGNOSIS — E785 Hyperlipidemia, unspecified: Secondary | ICD-10-CM | POA: Diagnosis not present

## 2023-02-27 DIAGNOSIS — J159 Unspecified bacterial pneumonia: Secondary | ICD-10-CM | POA: Diagnosis not present

## 2023-02-27 DIAGNOSIS — N1831 Chronic kidney disease, stage 3a: Secondary | ICD-10-CM | POA: Diagnosis not present

## 2023-03-04 DIAGNOSIS — R296 Repeated falls: Secondary | ICD-10-CM | POA: Diagnosis not present

## 2023-03-04 DIAGNOSIS — I10 Essential (primary) hypertension: Secondary | ICD-10-CM | POA: Diagnosis not present

## 2023-03-18 DIAGNOSIS — L2089 Other atopic dermatitis: Secondary | ICD-10-CM | POA: Diagnosis not present

## 2023-03-19 DIAGNOSIS — R4182 Altered mental status, unspecified: Secondary | ICD-10-CM | POA: Diagnosis not present

## 2023-03-19 DIAGNOSIS — J189 Pneumonia, unspecified organism: Secondary | ICD-10-CM | POA: Diagnosis not present

## 2023-03-21 DIAGNOSIS — I10 Essential (primary) hypertension: Secondary | ICD-10-CM | POA: Diagnosis not present

## 2023-03-21 DIAGNOSIS — R456 Violent behavior: Secondary | ICD-10-CM | POA: Diagnosis not present

## 2023-03-22 ENCOUNTER — Emergency Department (HOSPITAL_COMMUNITY): Payer: Medicare PPO

## 2023-03-22 ENCOUNTER — Encounter (HOSPITAL_COMMUNITY): Payer: Self-pay

## 2023-03-22 ENCOUNTER — Other Ambulatory Visit: Payer: Self-pay

## 2023-03-22 ENCOUNTER — Inpatient Hospital Stay (HOSPITAL_COMMUNITY)
Admission: EM | Admit: 2023-03-22 | Discharge: 2023-04-25 | DRG: 871 | Disposition: E | Payer: Medicare PPO | Source: Skilled Nursing Facility | Attending: Internal Medicine | Admitting: Internal Medicine

## 2023-03-22 DIAGNOSIS — E878 Other disorders of electrolyte and fluid balance, not elsewhere classified: Secondary | ICD-10-CM | POA: Diagnosis present

## 2023-03-22 DIAGNOSIS — Z79899 Other long term (current) drug therapy: Secondary | ICD-10-CM

## 2023-03-22 DIAGNOSIS — G9341 Metabolic encephalopathy: Secondary | ICD-10-CM | POA: Diagnosis not present

## 2023-03-22 DIAGNOSIS — N1831 Chronic kidney disease, stage 3a: Secondary | ICD-10-CM | POA: Diagnosis present

## 2023-03-22 DIAGNOSIS — G9389 Other specified disorders of brain: Secondary | ICD-10-CM | POA: Diagnosis not present

## 2023-03-22 DIAGNOSIS — Z87891 Personal history of nicotine dependence: Secondary | ICD-10-CM

## 2023-03-22 DIAGNOSIS — E8770 Fluid overload, unspecified: Secondary | ICD-10-CM | POA: Diagnosis present

## 2023-03-22 DIAGNOSIS — E860 Dehydration: Secondary | ICD-10-CM | POA: Diagnosis present

## 2023-03-22 DIAGNOSIS — Z7982 Long term (current) use of aspirin: Secondary | ICD-10-CM

## 2023-03-22 DIAGNOSIS — Z886 Allergy status to analgesic agent status: Secondary | ICD-10-CM

## 2023-03-22 DIAGNOSIS — Z993 Dependence on wheelchair: Secondary | ICD-10-CM

## 2023-03-22 DIAGNOSIS — A419 Sepsis, unspecified organism: Principal | ICD-10-CM | POA: Diagnosis present

## 2023-03-22 DIAGNOSIS — F03B18 Unspecified dementia, moderate, with other behavioral disturbance: Secondary | ICD-10-CM | POA: Diagnosis not present

## 2023-03-22 DIAGNOSIS — R6520 Severe sepsis without septic shock: Secondary | ICD-10-CM | POA: Diagnosis present

## 2023-03-22 DIAGNOSIS — E1122 Type 2 diabetes mellitus with diabetic chronic kidney disease: Secondary | ICD-10-CM | POA: Diagnosis present

## 2023-03-22 DIAGNOSIS — R400 Somnolence: Secondary | ICD-10-CM | POA: Diagnosis present

## 2023-03-22 DIAGNOSIS — J690 Pneumonitis due to inhalation of food and vomit: Secondary | ICD-10-CM | POA: Diagnosis not present

## 2023-03-22 DIAGNOSIS — Z833 Family history of diabetes mellitus: Secondary | ICD-10-CM

## 2023-03-22 DIAGNOSIS — R4182 Altered mental status, unspecified: Secondary | ICD-10-CM

## 2023-03-22 DIAGNOSIS — Z66 Do not resuscitate: Secondary | ICD-10-CM | POA: Diagnosis not present

## 2023-03-22 DIAGNOSIS — R680 Hypothermia, not associated with low environmental temperature: Secondary | ICD-10-CM | POA: Diagnosis present

## 2023-03-22 DIAGNOSIS — N179 Acute kidney failure, unspecified: Secondary | ICD-10-CM | POA: Diagnosis present

## 2023-03-22 DIAGNOSIS — L89311 Pressure ulcer of right buttock, stage 1: Secondary | ICD-10-CM | POA: Diagnosis not present

## 2023-03-22 DIAGNOSIS — R918 Other nonspecific abnormal finding of lung field: Secondary | ICD-10-CM | POA: Diagnosis not present

## 2023-03-22 DIAGNOSIS — E870 Hyperosmolality and hypernatremia: Secondary | ICD-10-CM | POA: Diagnosis not present

## 2023-03-22 DIAGNOSIS — E785 Hyperlipidemia, unspecified: Secondary | ICD-10-CM | POA: Diagnosis present

## 2023-03-22 DIAGNOSIS — I129 Hypertensive chronic kidney disease with stage 1 through stage 4 chronic kidney disease, or unspecified chronic kidney disease: Secondary | ICD-10-CM | POA: Diagnosis present

## 2023-03-22 DIAGNOSIS — I69393 Ataxia following cerebral infarction: Secondary | ICD-10-CM | POA: Diagnosis not present

## 2023-03-22 DIAGNOSIS — E538 Deficiency of other specified B group vitamins: Secondary | ICD-10-CM | POA: Diagnosis present

## 2023-03-22 DIAGNOSIS — E871 Hypo-osmolality and hyponatremia: Secondary | ICD-10-CM | POA: Diagnosis present

## 2023-03-22 DIAGNOSIS — Z515 Encounter for palliative care: Secondary | ICD-10-CM

## 2023-03-22 DIAGNOSIS — S0990XA Unspecified injury of head, initial encounter: Secondary | ICD-10-CM | POA: Diagnosis not present

## 2023-03-22 DIAGNOSIS — F03918 Unspecified dementia, unspecified severity, with other behavioral disturbance: Secondary | ICD-10-CM | POA: Diagnosis not present

## 2023-03-22 DIAGNOSIS — R4189 Other symptoms and signs involving cognitive functions and awareness: Secondary | ICD-10-CM | POA: Diagnosis present

## 2023-03-22 DIAGNOSIS — Z8249 Family history of ischemic heart disease and other diseases of the circulatory system: Secondary | ICD-10-CM

## 2023-03-22 DIAGNOSIS — Z7401 Bed confinement status: Secondary | ICD-10-CM

## 2023-03-22 DIAGNOSIS — Z888 Allergy status to other drugs, medicaments and biological substances status: Secondary | ICD-10-CM

## 2023-03-22 DIAGNOSIS — L89321 Pressure ulcer of left buttock, stage 1: Secondary | ICD-10-CM | POA: Diagnosis not present

## 2023-03-22 DIAGNOSIS — E1151 Type 2 diabetes mellitus with diabetic peripheral angiopathy without gangrene: Secondary | ICD-10-CM | POA: Diagnosis present

## 2023-03-22 DIAGNOSIS — R55 Syncope and collapse: Secondary | ICD-10-CM | POA: Diagnosis present

## 2023-03-22 DIAGNOSIS — I639 Cerebral infarction, unspecified: Secondary | ICD-10-CM | POA: Diagnosis not present

## 2023-03-22 LAB — CBC WITH DIFFERENTIAL/PLATELET
Abs Immature Granulocytes: 0.03 10*3/uL (ref 0.00–0.07)
Abs Immature Granulocytes: 0.03 10*3/uL (ref 0.00–0.07)
Basophils Absolute: 0 10*3/uL (ref 0.0–0.1)
Basophils Absolute: 0 10*3/uL (ref 0.0–0.1)
Basophils Relative: 0 %
Basophils Relative: 0 %
Eosinophils Absolute: 0.1 10*3/uL (ref 0.0–0.5)
Eosinophils Absolute: 0.1 10*3/uL (ref 0.0–0.5)
Eosinophils Relative: 1 %
Eosinophils Relative: 1 %
HCT: 35.4 % — ABNORMAL LOW (ref 39.0–52.0)
HCT: 34.7 % — ABNORMAL LOW (ref 39.0–52.0)
Hemoglobin: 11 g/dL — ABNORMAL LOW (ref 13.0–17.0)
Hemoglobin: 10.9 g/dL — ABNORMAL LOW (ref 13.0–17.0)
Immature Granulocytes: 0 %
Immature Granulocytes: 0 %
Lymphocytes Relative: 10 %
Lymphocytes Relative: 14 %
Lymphs Abs: 0.8 10*3/uL (ref 0.7–4.0)
Lymphs Abs: 1 10*3/uL (ref 0.7–4.0)
MCH: 30.9 pg (ref 26.0–34.0)
MCH: 30.8 pg (ref 26.0–34.0)
MCHC: 31.1 g/dL (ref 30.0–36.0)
MCHC: 31.4 g/dL (ref 30.0–36.0)
MCV: 99.4 fL (ref 80.0–100.0)
MCV: 98 fL (ref 80.0–100.0)
Monocytes Absolute: 0.5 10*3/uL (ref 0.1–1.0)
Monocytes Absolute: 0.4 10*3/uL (ref 0.1–1.0)
Monocytes Relative: 6 %
Monocytes Relative: 5 %
Neutro Abs: 6.6 10*3/uL (ref 1.7–7.7)
Neutro Abs: 5.9 10*3/uL (ref 1.7–7.7)
Neutrophils Relative %: 83 %
Neutrophils Relative %: 80 %
Platelets: 134 10*3/uL — ABNORMAL LOW (ref 150–400)
Platelets: 135 10*3/uL — ABNORMAL LOW (ref 150–400)
RBC: 3.56 MIL/uL — ABNORMAL LOW (ref 4.22–5.81)
RBC: 3.54 MIL/uL — ABNORMAL LOW (ref 4.22–5.81)
RDW: 19.3 % — ABNORMAL HIGH (ref 11.5–15.5)
RDW: 17.9 % — ABNORMAL HIGH (ref 11.5–15.5)
WBC: 8 10*3/uL (ref 4.0–10.5)
WBC: 7.4 10*3/uL (ref 4.0–10.5)
nRBC: 0.8 % — ABNORMAL HIGH (ref 0.0–0.2)
nRBC: 0.8 % — ABNORMAL HIGH (ref 0.0–0.2)

## 2023-03-22 LAB — URINALYSIS, ROUTINE W REFLEX MICROSCOPIC
Bilirubin Urine: NEGATIVE
Glucose, UA: NEGATIVE mg/dL
Hgb urine dipstick: NEGATIVE
Ketones, ur: NEGATIVE mg/dL
Leukocytes,Ua: NEGATIVE
Nitrite: NEGATIVE
Protein, ur: NEGATIVE mg/dL
Specific Gravity, Urine: 1.02 (ref 1.005–1.030)
pH: 5 (ref 5.0–8.0)

## 2023-03-22 LAB — BASIC METABOLIC PANEL
Anion gap: 9 (ref 5–15)
Anion gap: 10 (ref 5–15)
BUN: 59 mg/dL — ABNORMAL HIGH (ref 8–23)
BUN: 61 mg/dL — ABNORMAL HIGH (ref 8–23)
CO2: 23 mmol/L (ref 22–32)
CO2: 22 mmol/L (ref 22–32)
Calcium: 8.4 mg/dL — ABNORMAL LOW (ref 8.9–10.3)
Calcium: 8.4 mg/dL — ABNORMAL LOW (ref 8.9–10.3)
Chloride: 120 mmol/L — ABNORMAL HIGH (ref 98–111)
Chloride: 120 mmol/L — ABNORMAL HIGH (ref 98–111)
Creatinine, Ser: 2.13 mg/dL — ABNORMAL HIGH (ref 0.61–1.24)
Creatinine, Ser: 2.18 mg/dL — ABNORMAL HIGH (ref 0.61–1.24)
GFR, Estimated: 29 mL/min — ABNORMAL LOW (ref >60)
GFR, Estimated: 28 mL/min — ABNORMAL LOW (ref >60)
Glucose, Bld: 100 mg/dL — ABNORMAL HIGH (ref 70–99)
Glucose, Bld: 95 mg/dL (ref 70–99)
Potassium: 5.1 mmol/L (ref 3.5–5.1)
Potassium: 4.8 mmol/L (ref 3.5–5.1)
Sodium: 152 mmol/L — ABNORMAL HIGH (ref 135–145)
Sodium: 152 mmol/L — ABNORMAL HIGH (ref 135–145)

## 2023-03-22 LAB — COMPREHENSIVE METABOLIC PANEL
ALT: 54 U/L — ABNORMAL HIGH (ref 0–44)
AST: 37 U/L (ref 15–41)
Albumin: 2.7 g/dL — ABNORMAL LOW (ref 3.5–5.0)
Alkaline Phosphatase: 112 U/L (ref 38–126)
Anion gap: 8 (ref 5–15)
BUN: 59 mg/dL — ABNORMAL HIGH (ref 8–23)
CO2: 24 mmol/L (ref 22–32)
Calcium: 8.6 mg/dL — ABNORMAL LOW (ref 8.9–10.3)
Chloride: 119 mmol/L — ABNORMAL HIGH (ref 98–111)
Creatinine, Ser: 2.23 mg/dL — ABNORMAL HIGH (ref 0.61–1.24)
GFR, Estimated: 27 mL/min — ABNORMAL LOW (ref >60)
Glucose, Bld: 98 mg/dL (ref 70–99)
Potassium: 5.1 mmol/L (ref 3.5–5.1)
Sodium: 151 mmol/L — ABNORMAL HIGH (ref 135–145)
Total Bilirubin: 0.5 mg/dL (ref 0.3–1.2)
Total Protein: 7.1 g/dL (ref 6.5–8.1)

## 2023-03-22 LAB — MRSA NEXT GEN BY PCR, NASAL: MRSA by PCR Next Gen: NOT DETECTED

## 2023-03-22 LAB — VALPROIC ACID LEVEL: Valproic Acid Lvl: <10 ug/mL — ABNORMAL LOW (ref 50.0–100.0)

## 2023-03-22 LAB — BRAIN NATRIURETIC PEPTIDE: B Natriuretic Peptide: 45 pg/mL (ref 0.0–100.0)

## 2023-03-22 MED ORDER — SENNOSIDES-DOCUSATE SODIUM 8.6-50 MG PO TABS: 1.0000 | ORAL_TABLET | Freq: Every evening | ORAL | Status: DC | PRN

## 2023-03-22 MED ORDER — ACETAMINOPHEN 325 MG PO TABS: 650.0000 mg | ORAL_TABLET | Freq: Four times a day (QID) | ORAL | Status: DC | PRN

## 2023-03-22 MED ORDER — SODIUM CHLORIDE 0.9 % IV SOLN
2.0000 g | INTRAVENOUS | Status: DC
  Administered 2023-03-22: 2 g via INTRAVENOUS
  Filled 2023-03-22: qty 20

## 2023-03-22 MED ORDER — SODIUM CHLORIDE 0.9 % IV SOLN
500.0000 mg | INTRAVENOUS | Status: DC
  Administered 2023-03-22: 500 mg via INTRAVENOUS
  Filled 2023-03-22: qty 5

## 2023-03-22 MED ORDER — HEPARIN SODIUM (PORCINE) 5000 UNIT/ML IJ SOLN
5000.0000 [IU] | Freq: Three times a day (TID) | INTRAMUSCULAR | Status: DC
  Administered 2023-03-22 – 2023-03-23 (×4): 5000 [IU] via SUBCUTANEOUS
  Filled 2023-03-22 (×4): qty 1

## 2023-03-22 MED ORDER — SODIUM CHLORIDE 0.9 % IV SOLN: INTRAVENOUS | Status: AC

## 2023-03-22 MED ORDER — SODIUM CHLORIDE 0.9 % IV SOLN
1.0000 g | INTRAVENOUS | Status: DC
  Administered 2023-03-22: 1 g via INTRAVENOUS
  Filled 2023-03-22: qty 10

## 2023-03-22 MED ORDER — PIPERACILLIN-TAZOBACTAM 3.375 G IVPB
3.3750 g | Freq: Three times a day (TID) | INTRAVENOUS | Status: AC
  Administered 2023-03-22: 3.375 g via INTRAVENOUS
  Filled 2023-03-22 (×2): qty 50

## 2023-03-22 MED ORDER — DEXTROSE 5 % AND 0.45 % NACL IV BOLUS
500.0000 mL | Freq: Once | INTRAVENOUS | Status: AC
  Administered 2023-03-22: 500 mL via INTRAVENOUS

## 2023-03-22 MED ORDER — VANCOMYCIN HCL 1250 MG/250ML IV SOLN
1250.0000 mg | INTRAVENOUS | Status: DC
  Filled 2023-03-22 (×3): qty 250

## 2023-03-22 MED ORDER — SODIUM CHLORIDE 0.9 % IV SOLN: INTRAVENOUS | Status: DC

## 2023-03-22 MED ORDER — ACETAMINOPHEN 650 MG RE SUPP: 650.0000 mg | Freq: Four times a day (QID) | RECTAL | Status: DC | PRN

## 2023-03-22 NOTE — ED Triage Notes (Signed)
AMS x 3 days, recent D/c from hospital for pneumonia, was supposed to get IV rocephin, but [facility] was supposed unable to get an IV so IM rocephin was ordered. HCPOA notified. 20g rt ac by medic   Medic vitals  124/60 78bpm 12rr 34 eto2 90bgl 100% RA  DNR  DNI

## 2023-03-22 NOTE — ED Notes (Signed)
While prepping for in and out cath, patient began to freely urinate. Some caught for sample. It was noted that patient had brief on from facility that was discolored and had purulent odor. Multiple areas of breakdown noted along brief line and on bottom.

## 2023-03-22 NOTE — ED Notes (Signed)
Voicemail left for son in regards to patient being admitted.

## 2023-03-22 NOTE — Progress Notes (Signed)
Per Will , [facility] pharmacist, Zosyn is not compatible with Vanco for IV administration. Will put in IV consult.

## 2023-03-22 NOTE — ED Notes (Signed)
ED TO INPATIENT HANDOFF REPORT  ED Nurse Name and Phone #:  Marcello Moores [phone]  S Name/Age/Gender Joanell Rising Sr. 89 y.o. male Room/Bed: 034C/034C  Code Status   Code Status: Prior  Home/SNF/Other Nursing Home A&Ox0 Is this baseline? No   Triage Complete: Triage complete  Chief Complaint AKI (acute kidney injury) (HCC) [N17.9]  Triage Note AMS x 3 days, recent D/c from hospital for pneumonia, was supposed to get IV rocephin, but [facility] was supposed unable to get an IV so IM rocephin was ordered. HCPOA notified. 20g rt ac by medic   Medic vitals  124/60 78bpm 12rr 34 eto2 90bgl 100% RA  DNR  DNI   Allergies Allergies  Allergen Reactions   Losartan Potassium     Other reaction(s): made him sick   Nsaids     Other reaction(s): elevated kidney tests    Level of Care/Admitting Diagnosis ED Disposition     ED Disposition  Admit   Condition  --   Comment  Hospital Area: [facility] [[id number]]  Level of Care: Med-Surg [16]  May admit patient to [facility] or [facility] if equivalent level of care is available:: No  Covid Evaluation: Confirmed COVID Negative  Diagnosis: AKI (acute kidney injury) [facility]) [[id number]]  Admitting Physician: Gery Pray [[id number]]  Attending Physician: Gery Pray [[id number]]  Certification:: I certify this patient will need inpatient services for at least 2 midnights  Estimated Length of Stay: 2          B Medical/Surgery History Past Medical History:  Diagnosis Date   B12 deficiency    Bilateral inguinal hernia    CKD (chronic kidney disease)    Cognitive impairment    Diabetes mellitus without complication (HCC)    Hyperlipidemia    Hypertension    PAD (peripheral artery disease) (HCC)    Stroke (HCC)    History reviewed. No pertinent surgical history.   A IV Location/Drains/Wounds Patient Lines/Drains/Airways Status     Active Line/Drains/Airways     Name Placement date Placement time Site  Days   Peripheral IV 03/14/2023 20 G Posterior;Right Forearm 03/09/2023  --  Forearm  less than 1            Intake/Output Last 24 hours No intake or output data in the 24 hours ending 03/21/2023 0326  Labs/Imaging Results for orders placed or performed during the hospital encounter of 03/18/2023 (from the past 48 hour(s))  CBC with Differential     Status: Abnormal   Collection Time: 03/19/2023 12:38 AM  Result Value Ref Range   WBC 8.0 4.0 - 10.5 K/uL   RBC 3.56 (L) 4.22 - 5.81 MIL/uL   Hemoglobin 11.0 (L) 13.0 - 17.0 g/dL   HCT 29.5 (L) 28.4 - 13.2 %   MCV 99.4 80.0 - 100.0 fL   MCH 30.9 26.0 - 34.0 pg   MCHC 31.1 30.0 - 36.0 g/dL   RDW 44.0 (H) 10.2 - 72.5 %   Platelets 134 (L) 150 - 400 K/uL    Comment: REPEATED TO VERIFY   nRBC 0.8 (H) 0.0 - 0.2 %   Neutrophils Relative % 83 %   Neutro Abs 6.6 1.7 - 7.7 K/uL   Lymphocytes Relative 10 %   Lymphs Abs 0.8 0.7 - 4.0 K/uL   Monocytes Relative 6 %   Monocytes Absolute 0.5 0.1 - 1.0 K/uL   Eosinophils Relative 1 %   Eosinophils Absolute 0.1 0.0 - 0.5 K/uL   Basophils  Relative 0 %   Basophils Absolute 0.0 0.0 - 0.1 K/uL   Immature Granulocytes 0 %   Abs Immature Granulocytes 0.03 0.00 - 0.07 K/uL    Comment: Performed at [facility], [address] [place], [place] [address]  Urinalysis, Routine w reflex microscopic -Urine, Clean Catch     Status: Abnormal   Collection Time: 02/27/2023 12:38 AM  Result Value Ref Range   Color, Urine YELLOW YELLOW   APPearance HAZY (A) CLEAR   Specific Gravity, Urine 1.020 1.005 - 1.030   pH 5.0 5.0 - 8.0   Glucose, UA NEGATIVE NEGATIVE mg/dL   Hgb urine dipstick NEGATIVE NEGATIVE   Bilirubin Urine NEGATIVE NEGATIVE   Ketones, ur NEGATIVE NEGATIVE mg/dL   Protein, ur NEGATIVE NEGATIVE mg/dL   Nitrite NEGATIVE NEGATIVE   Leukocytes,Ua NEGATIVE NEGATIVE   RBC / HPF 0-5 0 - 5 RBC/hpf   WBC, UA 0-5 0 - 5 WBC/hpf   Bacteria, UA RARE (A) NONE SEEN   Squamous Epithelial / HPF 0-5 0 - 5  /HPF   Mucus PRESENT    Hyaline Casts, UA PRESENT    Granular Casts, UA PRESENT     Comment: Performed at [facility], [address] [place], [place] [address]  Comprehensive metabolic panel     Status: Abnormal   Collection Time: 03/13/2023  1:42 AM  Result Value Ref Range   Sodium 151 (H) 135 - 145 mmol/L   Potassium 5.1 3.5 - 5.1 mmol/L   Chloride 119 (H) 98 - 111 mmol/L   CO2 24 22 - 32 mmol/L   Glucose, Bld 98 70 - 99 mg/dL    Comment: Glucose reference range applies only to samples taken after fasting for at least 8 hours.   BUN 59 (H) 8 - 23 mg/dL   Creatinine, Ser 0.98 (H) 0.61 - 1.24 mg/dL   Calcium 8.6 (L) 8.9 - 10.3 mg/dL   Total Protein 7.1 6.5 - 8.1 g/dL   Albumin 2.7 (L) 3.5 - 5.0 g/dL   AST 37 15 - 41 U/L   ALT 54 (H) 0 - 44 U/L   Alkaline Phosphatase 112 38 - 126 U/L   Total Bilirubin 0.5 0.3 - 1.2 mg/dL   GFR, Estimated 27 (L) >60 mL/min    Comment: (NOTE) Calculated using the CKD-EPI Creatinine Equation (2021)    Anion gap 8 5 - 15    Comment: Performed at [facility], [address], [place], [place] [address]   CT Head Wo Contrast  Result Date: 03/23/2023 CLINICAL DATA:  Polytrauma, blunt, altered mental status EXAM: CT HEAD WITHOUT CONTRAST TECHNIQUE: Contiguous axial images were obtained from the base of the skull through the vertex without intravenous contrast. RADIATION DOSE REDUCTION: This exam was performed according to the departmental dose-optimization program which includes automated exposure control, adjustment of the mA and/or kV according to patient size and/or use of iterative reconstruction technique. COMPARISON:  11/13/2022 FINDINGS: Brain: Normal anatomic configuration. Parenchymal volume loss is commensurate with the patient's age. Stable moderate periventricular white matter changes are present likely reflecting the sequela of small vessel ischemia. Remote bilateral cerebellar infarcts are again noted. There is stable mild  ventriculomegaly with asymmetric enlargement of the temporal horns of the lateral ventricles, similar to prior examination. No abnormal intra or extra-axial mass lesion or fluid collection. No abnormal mass effect or midline shift. No evidence of acute intracranial hemorrhage or infarct. Vascular: No asymmetric hyperdense vasculature at the skull  base. Skull: Intact Sinuses/Orbits: Paranasal sinuses are clear. Orbits are unremarkable. Other: Mastoid air cells and middle ear cavities are clear. IMPRESSION: 1. No acute intracranial abnormality. No calvarial fracture. 2. Stable senescent changes and remote bilateral cerebellar infarcts. 3. Stable mild ventriculomegaly with asymmetric enlargement of the temporal horns of the lateral ventricles. This has progressed since more remote prior examinations of 06/08/2019 and, while this may simply represent asymmetric central atrophy, changes of normal pressure hydrocephalus could appear similarly. Correlation with clinical examination is recommended. Electronically Signed   By: Helyn Numbers M.D.   On: 03/06/2023 01:24   DG Chest Portable 1 View  Result Date: 03/05/2023 CLINICAL DATA:  Altered mental status EXAM: PORTABLE CHEST 1 VIEW COMPARISON:  11/16/2022 FINDINGS: Lung volumes are small, but are stable since prior examination. Patchy left basilar infiltrate is again seen, not significantly changed since prior examination. No pneumothorax or pleural effusion. Cardiac size within normal limits. Pulmonary vascularity is normal. No acute bone abnormality. IMPRESSION: 1. Pulmonary hypoinflation. 2. Stable patchy left basilar infiltrate. Electronically Signed   By: Helyn Numbers M.D.   On: 02/27/2023 01:17    Pending Labs Unresulted Labs (From admission, onward)     Start     Ordered   03/05/2023 0230  Valproic acid level  Once,   R        03/12/2023 0230            Vitals/Pain Today's Vitals   02/27/2023 0015 03/20/2023 0045  BP: 102/73 139/62  Pulse: 77 74   Resp: 15 10  SpO2: 100% 100%    Isolation Precautions No active isolations  Medications Medications  0.9 %  sodium chloride infusion ( Intravenous New Bag/Given 03/05/2023 0250)    Mobility non-ambulatory     Focused Assessments     R Recommendations: See Admitting Provider Note  Report given to:   Additional Notes:  hx of being combative, has not been combative here. Patient will moan, has not said words.

## 2023-03-22 NOTE — ED Provider Notes (Signed)
[place] EMERGENCY DEPARTMENT AT [facility] Provider Note   CSN: [id number] Arrival date & time: 03/06/2023  0010     History  Chief Complaint  Patient presents with   Altered Mental Status    Shane TUFFY Sr. is a 89 y.o. male.  The history is provided by the EMS personnel and medical records. The history is limited by the condition of the patient (level 5 caveat dementia).  Altered Mental Status Presenting symptoms: confusion   Presenting symptoms comment:  Decreased level of consciousness.  He is normally reportedly confused but combative but has not been combative.  He is on IM rocephin at [facility] for pneumonia  Severity:  Moderate Most recent episode:  More than 2 days ago Episode history:  Single Duration:  3 days Timing:  Constant Progression:  Unable to specify Chronicity:  New Context: nursing home resident   Associated symptoms: no fever       Past Medical History:  Diagnosis Date   B12 deficiency    Bilateral inguinal hernia    CKD (chronic kidney disease)    Cognitive impairment    Diabetes mellitus without complication (HCC)    Hyperlipidemia    Hypertension    PAD (peripheral artery disease) (HCC)    Stroke (HCC)      Home Medications Prior to Admission medications   Medication Sig Start Date End Date Taking? Authorizing Provider  acetaminophen (TYLENOL) 325 MG tablet Take 2 tablets (650 mg total) by mouth every 6 (six) hours as needed for mild pain (or Fever >/= 101). 11/22/22   Burnadette Pop, MD  amLODipine (NORVASC) 10 MG tablet Take 10 mg by mouth daily. 07/21/18   [provider]  amoxicillin-clavulanate (AUGMENTIN) 875-125 MG tablet Take 1 tablet by mouth every 12 (twelve) hours. 01/08/23   Pricilla Loveless, MD  aspirin 81 MG tablet Take 81 mg by mouth daily.    [provider]  atorvastatin (LIPITOR) 20 MG tablet Take 20 mg by mouth every evening. 06/20/18   [provider]  azithromycin (ZITHROMAX) 250  MG tablet Take 1 tablet (250 mg total) by mouth daily. 01/08/23   Pricilla Loveless, MD  feeding supplement (ENSURE ENLIVE / ENSURE PLUS) LIQD Take 237 mLs by mouth 2 (two) times daily between meals. 11/22/22   Burnadette Pop, MD  multivitamin (RENA-VIT) TABS tablet Take 1 tablet by mouth at bedtime. 11/22/22   Burnadette Pop, MD  QUEtiapine (SEROQUEL) 25 MG tablet Take 1 tablet (25 mg total) by mouth at bedtime. 11/22/22   Burnadette Pop, MD      Allergies    Losartan potassium and Nsaids    Review of Systems   Review of Systems  Unable to perform ROS: Mental status change  Constitutional:  Negative for fever.  HENT:  Negative for facial swelling.   Eyes:  Negative for redness.  Psychiatric/Behavioral:  Positive for confusion.     Physical Exam Updated Vital Signs BP 139/62   Pulse 74   Resp 10   SpO2 100%  Physical Exam Vitals and nursing note reviewed.  Constitutional:      General: He is not in acute distress.    Appearance: He is well-developed. He is not diaphoretic.  HENT:     Head: Normocephalic and atraumatic.     Nose: Nose normal.  Eyes:     Conjunctiva/sclera: Conjunctivae normal.     Pupils: Pupils are equal, round, and reactive to light.  Cardiovascular:     Rate  and Rhythm: Normal rate and regular rhythm.  Pulmonary:     Effort: Pulmonary effort is normal.     Breath sounds: Rhonchi present. No wheezing or rales.  Abdominal:     General: Bowel sounds are normal.     Palpations: Abdomen is soft.     Tenderness: There is no abdominal tenderness. There is no guarding or rebound.  Musculoskeletal:        General: Normal range of motion.     Cervical back: Normal range of motion and neck supple.  Skin:    General: Skin is warm and dry.     Capillary Refill: Capillary refill takes less than 2 seconds.  Neurological:     Deep Tendon Reflexes: Reflexes normal.     ED Results / Procedures / Treatments   Labs (all labs ordered are listed, but only abnormal  results are displayed) Results for orders placed or performed during the hospital encounter of 03/07/2023  CBC with Differential  Result Value Ref Range   WBC 8.0 4.0 - 10.5 K/uL   RBC 3.56 (L) 4.22 - 5.81 MIL/uL   Hemoglobin 11.0 (L) 13.0 - 17.0 g/dL   HCT 16.1 (L) 09.6 - 04.5 %   MCV 99.4 80.0 - 100.0 fL   MCH 30.9 26.0 - 34.0 pg   MCHC 31.1 30.0 - 36.0 g/dL   RDW 40.9 (H) 81.1 - 91.4 %   Platelets 134 (L) 150 - 400 K/uL   nRBC 0.8 (H) 0.0 - 0.2 %   Neutrophils Relative % 83 %   Neutro Abs 6.6 1.7 - 7.7 K/uL   Lymphocytes Relative 10 %   Lymphs Abs 0.8 0.7 - 4.0 K/uL   Monocytes Relative 6 %   Monocytes Absolute 0.5 0.1 - 1.0 K/uL   Eosinophils Relative 1 %   Eosinophils Absolute 0.1 0.0 - 0.5 K/uL   Basophils Relative 0 %   Basophils Absolute 0.0 0.0 - 0.1 K/uL   Immature Granulocytes 0 %   Abs Immature Granulocytes 0.03 0.00 - 0.07 K/uL  Urinalysis, Routine w reflex microscopic -Urine, Clean Catch  Result Value Ref Range   Color, Urine YELLOW YELLOW   APPearance HAZY (A) CLEAR   Specific Gravity, Urine 1.020 1.005 - 1.030   pH 5.0 5.0 - 8.0   Glucose, UA NEGATIVE NEGATIVE mg/dL   Hgb urine dipstick NEGATIVE NEGATIVE   Bilirubin Urine NEGATIVE NEGATIVE   Ketones, ur NEGATIVE NEGATIVE mg/dL   Protein, ur NEGATIVE NEGATIVE mg/dL   Nitrite NEGATIVE NEGATIVE   Leukocytes,Ua NEGATIVE NEGATIVE   RBC / HPF 0-5 0 - 5 RBC/hpf   WBC, UA 0-5 0 - 5 WBC/hpf   Bacteria, UA RARE (A) NONE SEEN   Squamous Epithelial / HPF 0-5 0 - 5 /HPF   Mucus PRESENT    Hyaline Casts, UA PRESENT    Granular Casts, UA PRESENT   Comprehensive metabolic panel  Result Value Ref Range   Sodium 151 (H) 135 - 145 mmol/L   Potassium 5.1 3.5 - 5.1 mmol/L   Chloride 119 (H) 98 - 111 mmol/L   CO2 24 22 - 32 mmol/L   Glucose, Bld 98 70 - 99 mg/dL   BUN 59 (H) 8 - 23 mg/dL   Creatinine, Ser 7.82 (H) 0.61 - 1.24 mg/dL   Calcium 8.6 (L) 8.9 - 10.3 mg/dL   Total Protein 7.1 6.5 - 8.1 g/dL   Albumin 2.7 (L)  3.5 - 5.0 g/dL   AST 37 15 - 41  U/L   ALT 54 (H) 0 - 44 U/L   Alkaline Phosphatase 112 38 - 126 U/L   Total Bilirubin 0.5 0.3 - 1.2 mg/dL   GFR, Estimated 27 (L) >60 mL/min   Anion gap 8 5 - 15   CT Head Wo Contrast  Result Date: 03/24/2023 CLINICAL DATA:  Polytrauma, blunt, altered mental status EXAM: CT HEAD WITHOUT CONTRAST TECHNIQUE: Contiguous axial images were obtained from the base of the skull through the vertex without intravenous contrast. RADIATION DOSE REDUCTION: This exam was performed according to the departmental dose-optimization program which includes automated exposure control, adjustment of the mA and/or kV according to patient size and/or use of iterative reconstruction technique. COMPARISON:  11/13/2022 FINDINGS: Brain: Normal anatomic configuration. Parenchymal volume loss is commensurate with the patient's age. Stable moderate periventricular white matter changes are present likely reflecting the sequela of small vessel ischemia. Remote bilateral cerebellar infarcts are again noted. There is stable mild ventriculomegaly with asymmetric enlargement of the temporal horns of the lateral ventricles, similar to prior examination. No abnormal intra or extra-axial mass lesion or fluid collection. No abnormal mass effect or midline shift. No evidence of acute intracranial hemorrhage or infarct. Vascular: No asymmetric hyperdense vasculature at the skull base. Skull: Intact Sinuses/Orbits: Paranasal sinuses are clear. Orbits are unremarkable. Other: Mastoid air cells and middle ear cavities are clear. IMPRESSION: 1. No acute intracranial abnormality. No calvarial fracture. 2. Stable senescent changes and remote bilateral cerebellar infarcts. 3. Stable mild ventriculomegaly with asymmetric enlargement of the temporal horns of the lateral ventricles. This has progressed since more remote prior examinations of 06/08/2019 and, while this may simply represent asymmetric central atrophy, changes of  normal pressure hydrocephalus could appear similarly. Correlation with clinical examination is recommended. Electronically Signed   By: Helyn Numbers M.D.   On: 03/08/2023 01:24   DG Chest Portable 1 View  Result Date: 02/28/2023 CLINICAL DATA:  Altered mental status EXAM: PORTABLE CHEST 1 VIEW COMPARISON:  11/16/2022 FINDINGS: Lung volumes are small, but are stable since prior examination. Patchy left basilar infiltrate is again seen, not significantly changed since prior examination. No pneumothorax or pleural effusion. Cardiac size within normal limits. Pulmonary vascularity is normal. No acute bone abnormality. IMPRESSION: 1. Pulmonary hypoinflation. 2. Stable patchy left basilar infiltrate. Electronically Signed   By: Helyn Numbers M.D.   On: 03/20/2023 01:17     Radiology CT Head Wo Contrast  Result Date: 03/15/2023 CLINICAL DATA:  Polytrauma, blunt, altered mental status EXAM: CT HEAD WITHOUT CONTRAST TECHNIQUE: Contiguous axial images were obtained from the base of the skull through the vertex without intravenous contrast. RADIATION DOSE REDUCTION: This exam was performed according to the departmental dose-optimization program which includes automated exposure control, adjustment of the mA and/or kV according to patient size and/or use of iterative reconstruction technique. COMPARISON:  11/13/2022 FINDINGS: Brain: Normal anatomic configuration. Parenchymal volume loss is commensurate with the patient's age. Stable moderate periventricular white matter changes are present likely reflecting the sequela of small vessel ischemia. Remote bilateral cerebellar infarcts are again noted. There is stable mild ventriculomegaly with asymmetric enlargement of the temporal horns of the lateral ventricles, similar to prior examination. No abnormal intra or extra-axial mass lesion or fluid collection. No abnormal mass effect or midline shift. No evidence of acute intracranial hemorrhage or infarct. Vascular: No  asymmetric hyperdense vasculature at the skull base. Skull: Intact Sinuses/Orbits: Paranasal sinuses are clear. Orbits are unremarkable. Other: Mastoid air cells and middle ear cavities are clear. IMPRESSION: 1.  No acute intracranial abnormality. No calvarial fracture. 2. Stable senescent changes and remote bilateral cerebellar infarcts. 3. Stable mild ventriculomegaly with asymmetric enlargement of the temporal horns of the lateral ventricles. This has progressed since more remote prior examinations of 06/08/2019 and, while this may simply represent asymmetric central atrophy, changes of normal pressure hydrocephalus could appear similarly. Correlation with clinical examination is recommended. Electronically Signed   By: Helyn Numbers M.D.   On: 03/03/2023 01:24   DG Chest Portable 1 View  Result Date: 03/08/2023 CLINICAL DATA:  Altered mental status EXAM: PORTABLE CHEST 1 VIEW COMPARISON:  11/16/2022 FINDINGS: Lung volumes are small, but are stable since prior examination. Patchy left basilar infiltrate is again seen, not significantly changed since prior examination. No pneumothorax or pleural effusion. Cardiac size within normal limits. Pulmonary vascularity is normal. No acute bone abnormality. IMPRESSION: 1. Pulmonary hypoinflation. 2. Stable patchy left basilar infiltrate. Electronically Signed   By: Helyn Numbers M.D.   On: 03/13/2023 01:17    Procedures Procedures    Medications Ordered in ED Medications  0.9 %  sodium chloride infusion ( Intravenous New Bag/Given 02/23/2023 0250)    ED Course/ Medical Decision Making/ A&P                             Medical Decision Making Patient sent in by EMS from nursing home on IM rocephin for PNA  Amount and/or Complexity of Data Reviewed Independent Historian: EMS    Details: See above  External Data Reviewed: labs and notes.    Details: Previous notes and labs reviewed creatinine 1.07  Labs: ordered.    Details: Urine without UTI, white  count normal 8, hemoglobin low 11 but up from baseline in 9s, low platelets 134, high sodium 151, normal potassium 5.1, elevated creatinine 2.23 normal LFTs  Radiology: ordered and independent interpretation performed. Decision-making details documented in ED Course.    Details: No pulmonary edema by me.    Risk Prescription drug management. Decision regarding hospitalization.    Final Clinical Impression(s) / ED Diagnoses Final diagnoses:  Dehydration  AKI (acute kidney injury) (HCC)  Hypernatremia  Altered mental status, unspecified altered mental status type   The patient appears reasonably stabilized for admission considering the current resources, flow, and capabilities available in the ED at this time, and I doubt any other [facility] requiring further screening and/or treatment in the ED prior to admission.  Rx / DC Orders ED Discharge Orders     None         Teyton Pattillo, MD 03/11/2023 [phone]

## 2023-03-22 NOTE — Progress Notes (Signed)
Requested 0815 Vanco dose from pharmacy electronically.

## 2023-03-22 NOTE — TOC Initial Note (Signed)
Transition of Care (TOC) - Initial/Assessment Note    Patient Details  Name: Shane BARBARIA Sr. MRN: [id number] Date of Birth: May 13, 1930  [facility]) CM/SW Contact:    Mearl Latin, LCSW Phone Number: 02/25/2023, 2:57 PM  Clinical Narrative:                 Patient admitted from [facility] long term care SNF and can return at discharge. CSW will continue to follow.   Expected Discharge Plan: Skilled Nursing Facility Barriers to Discharge: Continued Medical Work up   Patient Goals and CMS Choice            Expected Discharge Plan and Services In-house Referral: Clinical Social Work   Post Acute Care Choice: Skilled Nursing Facility Living arrangements for the past 2 months: Skilled Nursing Facility                                      Prior Living Arrangements/Services Living arrangements for the past 2 months: Skilled Nursing Facility Lives with:: Facility Resident Patient language and need for interpreter reviewed:: Yes Do you feel safe going back to the place where you live?: Yes      Need for Family Participation in Patient Care: Yes (Comment) Care giver support system in place?: Yes (comment)   Criminal Activity/Legal Involvement Pertinent to Current Situation/Hospitalization: No - Comment as needed  Activities of Daily Living Home Assistive Devices/Equipment: None ADL Screening (condition at time of admission) Patient's cognitive ability adequate to safely complete daily activities?: No Is the patient deaf or have difficulty hearing?: Yes Does the patient have difficulty seeing, even when wearing glasses/contacts?: No Does the patient have difficulty concentrating, remembering, or making decisions?: Yes Patient able to express need for assistance with ADLs?: No Does the patient have difficulty dressing or bathing?: Yes Independently performs ADLs?: No Communication: Dependent Dressing (OT): Dependent Grooming: Dependent Feeding:  Dependent Bathing: Dependent Toileting: Dependent In/Out Bed: Dependent Walks in Home: Dependent Does the patient have difficulty walking or climbing stairs?: Yes Weakness of Legs: None Weakness of Arms/Hands: Both  Permission Sought/Granted Permission sought to share information with : Facility Medical sales representative, Family Supports       Permission granted to share info w AGENCY: [facility]        Emotional Assessment Appearance:: Appears stated age Attitude/Demeanor/Rapport: Unable to Assess Affect (typically observed): Unable to Assess Orientation: :  (Disoriented x4) Alcohol / Substance Use: Not Applicable Psych Involvement: No (comment)  Admission diagnosis:  Dehydration [E86.0] Hypernatremia [E87.0] AKI (acute kidney injury) (HCC) [N17.9] Altered mental status, unspecified altered mental status type [R41.82] Patient Active Problem List   Diagnosis Date Noted   Dehydration 03/04/2023   AKI (acute kidney injury) (HCC) 03/10/2023   Hypernatremia 03/07/2023   Dementia with behavioral disturbance (HCC) 02/28/2023   PAD (peripheral artery disease) (HCC) 01/08/2023   Pernicious anemia 01/08/2023   Vitamin B12 deficiency 01/08/2023   UTI (urinary tract infection) 11/13/2022   Fall at home, initial encounter 11/13/2022   Infestation by bed bug 11/12/2022   Dementia without behavioral disturbance (HCC) 11/12/2022   Type 2 diabetes mellitus with diabetic peripheral angiopathy without gangrene, without long-term current use of insulin (HCC) 11/12/2022   LLL pneumonia 11/12/2022   DNR (do not resuscitate) 11/12/2022   Generalized weakness 08/26/2021   Stage 3a chronic kidney disease (CKD) (HCC) 08/26/2021   Cognitive impairment 01/03/2021   HLD (hyperlipidemia)  08/10/2018   Acute renal failure superimposed on stage 3a chronic kidney disease (HCC) - baseline SCr 1.2-1.4 08/10/2018   TIA (transient ischemic attack) 08/10/2018   Syncope 08/09/2018   Alterations of sensations,  late effect of cerebrovascular disease(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 [place] [address] Phone: [phone] Fax: [phone]     Social Determinants of Health (SDOH) Social History: SDOH Screenings   Food Insecurity: No Food Insecurity (02/24/2023)  Housing: Low Risk  (03/03/2023)  Transportation Needs: No Transportation Needs (03/16/2023)  Utilities: Patient Unable To Answer (03/10/2023)  Tobacco Use: Medium Risk (03/08/2023)   SDOH Interventions:     Readmission Risk Interventions     No data to display

## 2023-03-22 NOTE — Consult Note (Signed)
WOC Nurse Consult Note: Reason for Consult:Patient with several Stage 1 pressure injuries, buttocks and ischial tuberosities. The care of stage 1 PI are dictated by the standing order skin care order set, but I will activate today. Wound type:pressure Pressure Injury POA: Yes Measurement:To be obtained by bedside RN and documented on Nursing Flow Sheet  Wound bed:N/A Drainage (amount, consistency, odor) N/A Periwound:N/A Dressing procedure/placement/frequency: Turning and repositioning are in place, time in the supine position is to be minimized., Bilateral Prevalon Boots are provided. Silicone foam dressings are to be placed to the sacrum and bilateral ischial tuberosities.   WOC nursing team will not follow, but will remain available to this patient, the nursing and medical teams.  Please re-consult if needed.  Thank you for inviting [place] to participate in this patient's Plan of Care.  Ladona Mow, MSN, RN, CNS, GNP, Leda Min, [employer], [employer] phone:  [phone]

## 2023-03-22 NOTE — Hospital Course (Signed)
Same day note   Patient is a  89 year old male with past medical history of CVA, dementia with behavioral disturbances, T2DM, recurrent syncope, CKD stage IIIa, ataxia and CVA, from [facility], presented to hospital with somnolence.  At baseline patient is combative to nursing staff. In the ER labs revealed Na of 151, chloride 119, BUN 59, creatinine 2.23.  Baseline creatinine 1.3. patient was hypothermic at 90.8 F.  Admission requested Patient seen and examined at bedside.  Patient was admitted to the hospital for  At the time of my evaluation, patient complains of Physical examination reveals  Laboratory data and imaging was reviewed  Assessment/Plan   Dehydration   AKI on CKD  Patient was on IV Lasix patient on hold for and has received normal saline for the volume replacement.  Check BMP at noon.  Will transition to D5 water at some point.   Hypernatremia  Continue normal saline for now.  Changed to D5 within the next BMP.   Pneumonia with hypothermia  Has been started on Zosyn and vancomycin.  Check urinalysis.  Follow blood cultures.  Warm blanket.   Somnolence  -Multifactorial likely secondary to infection/polypharmacy/dehydration/hyponatremia.  Patient is on Ativan, Depakote, Seroquel.  Will continue to hold for now.    Dementia w/ behavioral disturbance Has issues with agitation.  Patient is on patient is on Ativan, Depakote, Seroquel.  All held   Lower extremity edema. -Lasix on hold.  Hydrating.  Monitor ongoing fluid overload    DNR (do not resuscitate)  History of stroke   No Charge  Signed,  Tenny Craw, MD [facility]

## 2023-03-22 NOTE — Progress Notes (Signed)
This RN called 5W to give nurse to nurse report for patient transfer.  Nurse Clinton not available at this time will reattempt in approx 10 min.

## 2023-03-22 NOTE — ED Notes (Signed)
Didn't need to In and Out him, pt urinated while attempting to clean in preporation.

## 2023-03-22 NOTE — Progress Notes (Addendum)
Same day note  Patient is a  89 year old male with past medical history of CVA, dementia with behavioral disturbances, T2DM, recurrent syncope, CKD stage IIIa, ataxia and CVA, from [facility], presented to hospital with somnolence.  At baseline patient is combative to nursing staff. In the ER labs revealed Na of 151, chloride 119, BUN 59, creatinine 2.23.  Baseline creatinine 1.3. patient was hypothermic at 90.8 F.  Admission requested.  Patient seen and examined at bedside.  Patient was admitted to the hospital for altered mentation.  At the time of my evaluation, patient appears to be somnolent with decreased responsiveness.  Unable to communicate.  Physical examination reveals elderly male with decreased responsiveness, bilateral lower extremity with dressing.  Contracted.  Has underlying dementia.  Left upper extremity swollen, bilateral lower extremity edema up to the knees.  Laboratory data and imaging was reviewed  Assessment/Plan   Dehydration   AKI on CKD  Patient was on IV Lasix, patient on hold for and has received normal saline for the volume replacement.  Check BMP at noon.  Will transition to D5 water at some point.   Hypernatremia  Continue normal saline for now.  Changed to D5W with the next BMP.  Pneumonia with hypothermia  Has been started on Zosyn and vancomycin.  Will change to Rocephin and Zithromax.  Check urinalysis.  Follow blood cultures.  Warm blanket.  Somnolence likely metabolic encephalopathy. -Multifactorial likely secondary to infection/polypharmacy/dehydration/hyponatremia.  Patient is on Ativan, Depakote, Seroquel. Will continue to hold for now.  Valproate level less than 10.   Dementia w/ behavioral disturbance Has issues with agitation.  Patient is on patient is on Ativan, Depakote, Seroquel.  All held for now due to somnolence   Lower extremity edema. -Lasix on hold.  Continue to hold.  Monitor ongoing fluid overload   DNR (do not  resuscitate)   History of stroke  Spoke with the patient's daughter on the phone and updated her about the potential poor prognosis. I also spoke with the patient's spouse and sons at bedside.  No Charge  Signed,  Tenny Craw, MD [facility]

## 2023-03-22 NOTE — Progress Notes (Signed)
Patients rectal temp now 98.63F. Bair hugger discontinued. 150cc of urine output noted in purewick suction canister. Bladder scanned and showed 597. In and out catheterization performed and yielded 700cc of yellow, clear urine. Patient turned on right side. Will continue to monitor.

## 2023-03-22 NOTE — ED Notes (Signed)
Patient turned and changed.

## 2023-03-22 NOTE — H&P (Addendum)
PCP:   [name], MD (Inactive)   Chief Complaint:  Altered mentation  HPI: This is a 89 year old male resident of [facility].  He has a PMHx of CVA, dementia with behavioral disturbances, T2DM, recurrent syncope, CKD stage IIIa, ataxia d/t CVA.  At baseline patient combative to the nursing home staff.  Over the past few days he has been more more somnolent.  He was sent to the ER.  New medications include IM Rocephin treatment for pneumonia started 6/27- 7/1 and Ativan 0.5 mg daily starting 6/10.  In the ER labs reveal Na of 151, chloride 119, BUN 59, creatinine 2.23.  Baseline creatinine 1.3. patient hypothermic 90.8.  Admission requested  Review of Systems:  Unable to obtain due to dementia and acuity of illness  Past Medical History: Past Medical History:  Diagnosis Date   B12 deficiency    Bilateral inguinal hernia    CKD (chronic kidney disease)    Cognitive impairment    Diabetes mellitus without complication (HCC)    Hyperlipidemia    Hypertension    PAD (peripheral artery disease) (HCC)    Stroke (HCC)    History reviewed. No pertinent surgical history.  Medications: Prior to Admission medications   Medication Sig Start Date End Date Taking? Authorizing Provider  acetaminophen (TYLENOL) 325 MG tablet Take 2 tablets (650 mg total) by mouth every 6 (six) hours as needed for mild pain (or Fever >/= 101). 11/22/22   Burnadette Pop, MD  amLODipine (NORVASC) 10 MG tablet Take 10 mg by mouth daily. 07/21/18   [provider]  amoxicillin-clavulanate (AUGMENTIN) 875-125 MG tablet Take 1 tablet by mouth every 12 (twelve) hours. 01/08/23   Pricilla Loveless, MD  aspirin 81 MG tablet Take 81 mg by mouth daily.    [provider]  atorvastatin (LIPITOR) 20 MG tablet Take 20 mg by mouth every evening. 06/20/18   [provider]  azithromycin (ZITHROMAX) 250 MG tablet Take 1 tablet (250 mg total) by mouth daily. 01/08/23   Pricilla Loveless, MD  feeding  supplement (ENSURE ENLIVE / ENSURE PLUS) LIQD Take 237 mLs by mouth 2 (two) times daily between meals. 11/22/22   Burnadette Pop, MD  multivitamin (RENA-VIT) TABS tablet Take 1 tablet by mouth at bedtime. 11/22/22   Burnadette Pop, MD  QUEtiapine (SEROQUEL) 25 MG tablet Take 1 tablet (25 mg total) by mouth at bedtime. 11/22/22   Burnadette Pop, MD    Allergies:   Allergies  Allergen Reactions   Losartan Potassium     Other reaction(s): made him sick   Nsaids     Other reaction(s): elevated kidney tests    Social History:  reports that he quit smoking about 56 years ago. His smoking use included cigarettes. He has never used smokeless tobacco. He reports that he does not drink alcohol and does not use drugs.  Family History: Family History  Problem Relation Age of Onset   Diabetes Sister    Heart attack Sister     Physical Exam: Vitals:   03/01/2023 0015 03/06/2023 0045  BP: 102/73 139/62  Pulse: 77 74  Resp: 15 10  SpO2: 100% 100%    General: Awake, demented.  Not interactive Eyes: Pink conjunctiva, no scleral icterus ENT: Dry oral mucosa Lungs: clear to ascultation, no wheeze, no crackles, no use of accessory muscles Cardiovascular: regular rate and rhythm, no regurgitation, no gallops, no murmurs. No carotid bruits, no JVD Abdomen: soft, positive BS, non-tender, non-distended GU: not examined Neuro: Unable  to assess Musculoskeletal: Patient contracted.>4+ B/L LE edema to above knees.  Left upper extremity hand swollen, possible chronic contraction Skin: no rash, no subcutaneous crepitation, no decubitus Psych: Demented   Labs on Admission:  Recent Labs    03/07/2023 0142  NA 151*  K 5.1  CL 119*  CO2 24  GLUCOSE 98  BUN 59*  CREATININE 2.23*  CALCIUM 8.6*   Recent Labs    03/14/2023 0142  AST 37  ALT 54*  ALKPHOS 112  BILITOT 0.5  PROT 7.1  ALBUMIN 2.7*    Recent Labs    03/10/2023 0038  WBC 8.0  NEUTROABS 6.6  HGB 11.0*  HCT 35.4*  MCV 99.4  PLT  134*    Radiological Exams on Admission: CT Head Wo Contrast  Result Date: 03/14/2023 CLINICAL DATA:  Polytrauma, blunt, altered mental status EXAM: CT HEAD WITHOUT CONTRAST TECHNIQUE: Contiguous axial images were obtained from the base of the skull through the vertex without intravenous contrast. RADIATION DOSE REDUCTION: This exam was performed according to the departmental dose-optimization program which includes automated exposure control, adjustment of the mA and/or kV according to patient size and/or use of iterative reconstruction technique. COMPARISON:  11/13/2022 FINDINGS: Brain: Normal anatomic configuration. Parenchymal volume loss is commensurate with the patient's age. Stable moderate periventricular white matter changes are present likely reflecting the sequela of small vessel ischemia. Remote bilateral cerebellar infarcts are again noted. There is stable mild ventriculomegaly with asymmetric enlargement of the temporal horns of the lateral ventricles, similar to prior examination. No abnormal intra or extra-axial mass lesion or fluid collection. No abnormal mass effect or midline shift. No evidence of acute intracranial hemorrhage or infarct. Vascular: No asymmetric hyperdense vasculature at the skull base. Skull: Intact Sinuses/Orbits: Paranasal sinuses are clear. Orbits are unremarkable. Other: Mastoid air cells and middle ear cavities are clear. IMPRESSION: 1. No acute intracranial abnormality. No calvarial fracture. 2. Stable senescent changes and remote bilateral cerebellar infarcts. 3. Stable mild ventriculomegaly with asymmetric enlargement of the temporal horns of the lateral ventricles. This has progressed since more remote prior examinations of 06/08/2019 and, while this may simply represent asymmetric central atrophy, changes of normal pressure hydrocephalus could appear similarly. Correlation with clinical examination is recommended. Electronically Signed   By: Helyn Numbers M.D.    On: 03/03/2023 01:24   DG Chest Portable 1 View  Result Date: 03/02/2023 CLINICAL DATA:  Altered mental status EXAM: PORTABLE CHEST 1 VIEW COMPARISON:  11/16/2022 FINDINGS: Lung volumes are small, but are stable since prior examination. Patchy left basilar infiltrate is again seen, not significantly changed since prior examination. No pneumothorax or pleural effusion. Cardiac size within normal limits. Pulmonary vascularity is normal. No acute bone abnormality. IMPRESSION: 1. Pulmonary hypoinflation. 2. Stable patchy left basilar infiltrate. Electronically Signed   By: Helyn Numbers M.D.   On: 03/14/2023 01:17    Assessment/Plan Present on Admission:  Dehydration //  AKI on CKD -IV fluid hydration with NS -Patient is on Lasix, will be held -BMP at noon -I's and O's   Hypernatremia //  hyperchloremia -Corrected with IV fluid hydration -BMP at noon   Pneumonia //  Hypothermia //  -Continue Rocephin, starting Zosyn and vancomycin for FAP -Rule out additional infection with UA with culture -Blood cultures x 2 ordered -Warming blanket/bear hugger   Somnolence  -Multifactorial infection/polypharmacy/dehydration/hyponatremia.   -Patient is on Ativan, Depakote, Seroquel.  All held   Dementia w/ behavioral disturbance -Patient is on Ativan, Depakote, Seroquel.  All held  Fluid overload bilateral lower extremity -Lasix on hold.  Hydrating.  Monitor ongoing fluid overload   DNR (do not resuscitate)  History of stroke  Xaria Judon 03/13/2023, 3:12 AM

## 2023-03-22 NOTE — Progress Notes (Signed)
Pharmacy Antibiotic Note  Shane Juarez. is a 89 y.o. male admitted on 03/08/2023 with pneumonia.  Pharmacy has been consulted for Vancomycin/Zosyn dosing. WBC WNL. Noted renal dysfunction.  Plan: Vancomycin 1250 mg IV q48h >>>Estimated AUC: 540 Zosyn 3.375G IV q8h to be infused over 4 hours Trend WBC, temp, renal function  F/U infectious work-up Drug levels as indicated   Weight: 76.4 kg (168 lb 6.9 oz)  Temp (24hrs), Avg:90.8 F (32.7 C), Min:90.8 F (32.7 C), Max:90.8 F (32.7 C)  Recent Labs  Lab 02/28/2023 0038 03/06/2023 0142  WBC 8.0  --   CREATININE  --  2.23*    CrCl cannot be calculated (Unknown ideal weight.).    Allergies  Allergen Reactions   Losartan Potassium     Other reaction(s): made him sick   Nsaids     Other reaction(s): elevated kidney tests    Abran Duke, PharmD, BCPS Clinical Pharmacist Phone: [phone]

## 2023-03-23 DIAGNOSIS — E870 Hyperosmolality and hypernatremia: Secondary | ICD-10-CM

## 2023-03-23 DIAGNOSIS — N179 Acute kidney failure, unspecified: Secondary | ICD-10-CM | POA: Diagnosis not present

## 2023-03-23 DIAGNOSIS — E860 Dehydration: Secondary | ICD-10-CM | POA: Diagnosis not present

## 2023-03-23 DIAGNOSIS — F03918 Unspecified dementia, unspecified severity, with other behavioral disturbance: Secondary | ICD-10-CM

## 2023-03-23 LAB — CBC
HCT: 33.9 % — ABNORMAL LOW (ref 39.0–52.0)
Hemoglobin: 11 g/dL — ABNORMAL LOW (ref 13.0–17.0)
MCH: 31.3 pg (ref 26.0–34.0)
MCHC: 32.4 g/dL (ref 30.0–36.0)
MCV: 96.3 fL (ref 80.0–100.0)
Platelets: 111 10*3/uL — ABNORMAL LOW (ref 150–400)
RBC: 3.52 MIL/uL — ABNORMAL LOW (ref 4.22–5.81)
RDW: 18.1 % — ABNORMAL HIGH (ref 11.5–15.5)
WBC: 21.8 10*3/uL — ABNORMAL HIGH (ref 4.0–10.5)
nRBC: 0.4 % — ABNORMAL HIGH (ref 0.0–0.2)

## 2023-03-23 LAB — COMPREHENSIVE METABOLIC PANEL
ALT: 45 U/L — ABNORMAL HIGH (ref 0–44)
AST: 44 U/L — ABNORMAL HIGH (ref 15–41)
Albumin: 2.4 g/dL — ABNORMAL LOW (ref 3.5–5.0)
Alkaline Phosphatase: 104 U/L (ref 38–126)
Anion gap: 16 — ABNORMAL HIGH (ref 5–15)
BUN: 70 mg/dL — ABNORMAL HIGH (ref 8–23)
CO2: 20 mmol/L — ABNORMAL LOW (ref 22–32)
Calcium: 8.1 mg/dL — ABNORMAL LOW (ref 8.9–10.3)
Chloride: 116 mmol/L — ABNORMAL HIGH (ref 98–111)
Creatinine, Ser: 2.85 mg/dL — ABNORMAL HIGH (ref 0.61–1.24)
GFR, Estimated: 20 mL/min — ABNORMAL LOW (ref >60)
Glucose, Bld: 199 mg/dL — ABNORMAL HIGH (ref 70–99)
Potassium: 5.6 mmol/L — ABNORMAL HIGH (ref 3.5–5.1)
Sodium: 152 mmol/L — ABNORMAL HIGH (ref 135–145)
Total Bilirubin: 0.2 mg/dL — ABNORMAL LOW (ref 0.3–1.2)
Total Protein: 6.5 g/dL (ref 6.5–8.1)

## 2023-03-23 LAB — MAGNESIUM: Magnesium: 2.5 mg/dL — ABNORMAL HIGH (ref 1.7–2.4)

## 2023-03-23 LAB — CULTURE, BLOOD (ROUTINE X 2)
Culture: NO GROWTH
Culture: NO GROWTH

## 2023-03-23 MED ORDER — ONDANSETRON 4 MG PO TBDP: 4.0000 mg | ORAL_TABLET | Freq: Four times a day (QID) | ORAL | Status: DC | PRN

## 2023-03-23 MED ORDER — HYDROMORPHONE HCL 1 MG/ML IJ SOLN
0.5000 mg | Freq: Four times a day (QID) | INTRAMUSCULAR | Status: DC
  Administered 2023-03-23 – 2023-03-28 (×21): 0.5 mg via INTRAVENOUS
  Filled 2023-03-23 (×21): qty 0.5

## 2023-03-23 MED ORDER — HALOPERIDOL 1 MG PO TABS: 0.5000 mg | ORAL_TABLET | ORAL | Status: DC | PRN

## 2023-03-23 MED ORDER — SODIUM CHLORIDE 0.9% FLUSH
3.0000 mL | Freq: Two times a day (BID) | INTRAVENOUS | Status: DC
  Administered 2023-03-23 – 2023-03-28 (×11): 3 mL via INTRAVENOUS

## 2023-03-23 MED ORDER — SODIUM CHLORIDE 0.9 % IV SOLN: 250.0000 mL | INTRAVENOUS | Status: DC | PRN

## 2023-03-23 MED ORDER — GLYCOPYRROLATE 0.2 MG/ML IJ SOLN: 0.2000 mg | INTRAMUSCULAR | Status: DC | PRN

## 2023-03-23 MED ORDER — LORAZEPAM 2 MG/ML PO CONC: 1.0000 mg | ORAL | Status: DC | PRN

## 2023-03-23 MED ORDER — HALOPERIDOL LACTATE 2 MG/ML PO CONC: 0.5000 mg | ORAL | Status: DC | PRN

## 2023-03-23 MED ORDER — ONDANSETRON HCL 4 MG/2ML IJ SOLN: 4.0000 mg | Freq: Four times a day (QID) | INTRAMUSCULAR | Status: DC | PRN

## 2023-03-23 MED ORDER — HYDROMORPHONE HCL 1 MG/ML IJ SOLN
0.5000 mg | INTRAMUSCULAR | Status: DC | PRN
  Administered 2023-03-27: 0.5 mg via INTRAVENOUS
  Filled 2023-03-23: qty 0.5

## 2023-03-23 MED ORDER — ALBUTEROL SULFATE (2.5 MG/3ML) 0.083% IN NEBU: 2.5000 mg | INHALATION_SOLUTION | RESPIRATORY_TRACT | Status: DC | PRN

## 2023-03-23 MED ORDER — GLYCOPYRROLATE 1 MG PO TABS: 1.0000 mg | ORAL_TABLET | ORAL | Status: DC | PRN

## 2023-03-23 MED ORDER — GLYCOPYRROLATE 0.2 MG/ML IJ SOLN
0.2000 mg | INTRAMUSCULAR | Status: DC | PRN
  Administered 2023-03-27 – 2023-03-28 (×2): 0.2 mg via INTRAVENOUS
  Filled 2023-03-23 (×2): qty 1

## 2023-03-23 MED ORDER — ACETAMINOPHEN 650 MG RE SUPP: 650.0000 mg | Freq: Four times a day (QID) | RECTAL | Status: DC | PRN

## 2023-03-23 MED ORDER — LORAZEPAM 1 MG PO TABS: 1.0000 mg | ORAL_TABLET | ORAL | Status: DC | PRN

## 2023-03-23 MED ORDER — LORAZEPAM 2 MG/ML IJ SOLN: 1.0000 mg | INTRAMUSCULAR | Status: DC | PRN

## 2023-03-23 MED ORDER — ACETAMINOPHEN 325 MG PO TABS: 650.0000 mg | ORAL_TABLET | Freq: Four times a day (QID) | ORAL | Status: DC | PRN

## 2023-03-23 MED ORDER — POLYVINYL ALCOHOL 1.4 % OP SOLN: 1.0000 [drp] | Freq: Four times a day (QID) | OPHTHALMIC | Status: DC | PRN

## 2023-03-23 MED ORDER — HALOPERIDOL LACTATE 5 MG/ML IJ SOLN: 0.5000 mg | INTRAMUSCULAR | Status: DC | PRN

## 2023-03-23 MED ORDER — SODIUM CHLORIDE 0.9% FLUSH: 3.0000 mL | INTRAVENOUS | Status: DC | PRN

## 2023-03-23 NOTE — Progress Notes (Signed)
Rectal temp @ 0614 was 94.5. Bair hugger reapplied to patient. Bladder scan performed for 184. Male purewick in place.

## 2023-03-23 NOTE — Progress Notes (Signed)
Unreponsive to sternal rub Hypothermic Worsening renal function-persistent hyponatremia-worsening leukocytosis Spoke with daughter-Shane Juarez-explained poor overall situation-recommended comfort care-patient apparently has advanced directives in place-does not desire life-prolonging measures in this situation-comfort care orders placed.

## 2023-03-23 NOTE — Progress Notes (Signed)
PROGRESS NOTE        PATIENT DETAILS Name: Shane FORRISTER Sr. Age: 89 y.o. Sex: male Date of Birth: October 01, 1929 Admit Date: 03/19/2023 Admitting Physician Gery Pray, MD ZOX:WRUE, Maryla Morrow, MD (Inactive)  Brief Summary: Patient is a 89 y.o.  male-resident of SNF-advanced dementia-mostly bed to wheelchair bound-presented with acute metabolic encephalopathy in the setting of aspiration pneumonia, AKI and hypernatremia.  Significant events: 6/28>> admit to [facility] 6/29>> remains obtunded/hypothermic-worsening AKI-persistently hyponatremic-transition to full comfort measures.  Significant studies: 6/28>> CT head: No acute abnormality 6/28>> patchy left basilar infiltrate  Significant microbiology data: 6/28>> blood culture: Negative  Procedures: None  Consults: None  Subjective: Unresponsive to sternal rub.  Accumulating some secretions in oral cavity.  Objective: Vitals: Blood pressure 98/70, pulse 90, temperature (!) 94.5 F (34.7 C), temperature source Rectal, resp. rate 18, height 5\' 7"  (1.702 m), weight 76.4 kg, SpO2 96 %.   Exam: Gen Exam: Unresponsive HEENT:atraumatic, normocephalic Chest: Some transmitted upper airway sounds CVS:S1S2 regular Abdomen:soft non tender, non distended Extremities:no edema Neurology: Difficult exam-withdraws lower extremity to pain Skin: no rash  Pertinent Labs/Radiology:    Latest Ref Rng & Units 03/23/2023    6:00 AM 03/15/2023    8:58 AM 03/18/2023   12:38 AM  CBC  WBC 4.0 - 10.5 K/uL 21.8  7.4  8.0   Hemoglobin 13.0 - 17.0 g/dL 45.4  09.8  11.9   Hematocrit 39.0 - 52.0 % 33.9  34.7  35.4   Platelets 150 - 400 K/uL 111  135  134     Lab Results  Component Value Date   NA 152 (H) 03/23/2023   K 5.6 (H) 03/23/2023   CL 116 (H) 03/23/2023   CO2 20 (L) 03/23/2023      Assessment/Plan: Severe sepsis due to aspiration pneumonia with AKI/metabolic encephalopathy  Deteriorating in spite of being on  IV fluids/IV antibiotics-persistently hypothermic this morning-remains essentially unresponsive and accumulating secretions After speaking with daughter-transition to full comfort measures-stopping IVF/antibiotics.  AKI on CKD stage IIIa Hypernatremia Secondary to dehydration in the setting of sepsis/poor oral intake Comfort care-stopping IV fluids  Acute metabolic encephalopathy Secondary to AKI/hyper natremia Comfort care-stopping IV fluids No plans to check/follow electrolytes.  History of CVA  History of dementia with behavioral disturbance SNF resident-poor functional baseline-mostly bedbound and dependent on nursing staff for ADLs  Palliative care DNR in place Deteriorating overnight Spoke with daughter-[name]-has advanced directives in place-would not want life-prolonging measures if he was in the situation.  She is his primary Management consultant.  Agreeable with transitioning to full comfort Corky Downs is aware that his life expectancy is only a few days.  Expect in-hospital death.  Pressure Ulcer: Pressure Injury 03/11/2023 Ischial tuberosity Left;Right Stage 1 -  Intact skin with non-blanchable redness of a localized area usually over a bony prominence. Redened,blachable areas on bilateral hip bone,painful to touch (Active)  03/13/2023 0510  Location: Ischial tuberosity  Location Orientation: Left;Right  Staging: Stage 1 -  Intact skin with non-blanchable redness of a localized area usually over a bony prominence.  Wound Description (Comments): Redened,blachable areas on bilateral hip bone,painful to touch  Present on Admission: Yes  Dressing Type Foam - Lift dressing to assess site every shift 03/23/2023 2000     Pressure Injury 03/17/2023 Ischial tuberosity Right Stage 1 -  Intact skin with non-blanchable redness of  a localized area usually over a bony prominence. Reddened areas on the hip bone. Blanchable and painful to touch (Active)  03/14/2023   Location: Ischial tuberosity   Location Orientation: Right  Staging: Stage 1 -  Intact skin with non-blanchable redness of a localized area usually over a bony prominence.  Wound Description (Comments): Reddened areas on the hip bone. Blanchable and painful to touch  Present on Admission: Yes  Dressing Type Foam - Lift dressing to assess site every shift 03/01/2023 2000    BMI: Estimated body mass index is 26.38 kg/m as calculated from the following:   Height as of this encounter: 5\' 7"  (1.702 m).   Weight as of this encounter: 76.4 kg.   Code status:   Code Status: DNR   DVT Prophylaxis:Not needed-comfort care   Family Communication: Daughter-[name]-[phone] updated over the phone.   Disposition Plan: Status is: Inpatient Remains inpatient appropriate because: Severity of illness   Planned Discharge Destination: In-hospital death   Diet: Diet Order             Diet NPO time specified  Diet effective now                     Antimicrobial agents: Anti-infectives (From admission, onward)    Start     Dose/Rate Route Frequency Ordered Stop   03/05/2023 2200  cefTRIAXone (ROCEPHIN) 2 g in sodium chloride 0.9 % 100 mL IVPB  Status:  Discontinued        2 g 200 mL/hr over 30 Minutes Intravenous Every 24 hours 03/08/2023 1338 03/23/23 0805   03/16/2023 1430  azithromycin (ZITHROMAX) 500 mg in sodium chloride 0.9 % 250 mL IVPB  Status:  Discontinued        500 mg 250 mL/hr over 60 Minutes Intravenous Every 24 hours 03/07/2023 1338 03/23/23 0805   03/06/2023 0815  vancomycin (VANCOREADY) IVPB 1250 mg/250 mL  Status:  Discontinued        1,250 mg 166.7 mL/hr over 90 Minutes Intravenous Every 48 hours 03/13/2023 0715 03/16/2023 1335   03/15/2023 0715  piperacillin-tazobactam (ZOSYN) IVPB 3.375 g        3.375 g 12.5 mL/hr over 240 Minutes Intravenous Every 8 hours 03/17/2023 0713 02/25/2023 1530   02/23/2023 0415  cefTRIAXone (ROCEPHIN) 1 g in sodium chloride 0.9 % 100 mL IVPB  Status:  Discontinued        1 g 200  mL/hr over 30 Minutes Intravenous Every 24 hours 02/28/2023 0414 03/13/2023 0655        MEDICATIONS: Scheduled Meds:   HYDROmorphone (DILAUDID) injection  0.5 mg Intravenous Q6H   sodium chloride flush  3 mL Intravenous Q12H   Continuous Infusions:  sodium chloride     PRN Meds:.sodium chloride, acetaminophen **OR** acetaminophen, albuterol, glycopyrrolate **OR** glycopyrrolate **OR** glycopyrrolate, haloperidol **OR** haloperidol **OR** haloperidol lactate, HYDROmorphone (DILAUDID) injection, LORazepam **OR** LORazepam **OR** LORazepam, ondansetron **OR** ondansetron (ZOFRAN) IV, polyvinyl alcohol, sodium chloride flush   I have personally reviewed following labs and imaging studies  LABORATORY DATA: CBC: Recent Labs  Lab 02/27/2023 0038 03/23/2023 0858 03/23/23 0600  WBC 8.0 7.4 21.8*  NEUTROABS 6.6 5.9  --   HGB 11.0* 10.9* 11.0*  HCT 35.4* 34.7* 33.9*  MCV 99.4 98.0 96.3  PLT 134* 135* 111*    Basic Metabolic Panel: Recent Labs  Lab 02/23/2023 0142 03/12/2023 0858 03/14/2023 1630 03/23/23 0600  NA 151* 152* 152* 152*  K 5.1 5.1 4.8 5.6*  CL 119* 120* 120* 116*  CO2 24 23 22  20*  GLUCOSE 98 100* 95 199*  BUN 59* 59* 61* 70*  CREATININE 2.23* 2.13* 2.18* 2.85*  CALCIUM 8.6* 8.4* 8.4* 8.1*  MG  --   --   --  2.5*    GFR: Estimated Creatinine Clearance: 15.8 mL/min (A) (by C-G formula based on SCr of 2.85 mg/dL (H)).  Liver Function Tests: Recent Labs  Lab 03/21/2023 0142 03/23/23 0600  AST 37 44*  ALT 54* 45*  ALKPHOS 112 104  BILITOT 0.5 0.2*  PROT 7.1 6.5  ALBUMIN 2.7* 2.4*   No results for input(s): "LIPASE", "AMYLASE" in the last 168 hours. No results for input(s): "AMMONIA" in the last 168 hours.  Coagulation Profile: No results for input(s): "INR", "PROTIME" in the last 168 hours.  Cardiac Enzymes: No results for input(s): "CKTOTAL", "CKMB", "CKMBINDEX", "TROPONINI" in the last 168 hours.  BNP (last 3 results) No results for input(s): "PROBNP" in  the last 8760 hours.  Lipid Profile: No results for input(s): "CHOL", "HDL", "LDLCALC", "TRIG", "CHOLHDL", "LDLDIRECT" in the last 72 hours.  Thyroid Function Tests: No results for input(s): "TSH", "T4TOTAL", "FREET4", "T3FREE", "THYROIDAB" in the last 72 hours.  Anemia Panel: No results for input(s): "VITAMINB12", "FOLATE", "FERRITIN", "TIBC", "IRON", "RETICCTPCT" in the last 72 hours.  Urine analysis:    Component Value Date/Time   COLORURINE YELLOW 03/12/2023 0038   APPEARANCEUR HAZY (A) 03/07/2023 0038   LABSPEC 1.020 03/20/2023 0038   PHURINE 5.0 03/03/2023 0038   GLUCOSEU NEGATIVE 02/26/2023 0038   HGBUR NEGATIVE 03/11/2023 0038   BILIRUBINUR NEGATIVE 02/23/2023 0038   KETONESUR NEGATIVE 03/07/2023 0038   PROTEINUR NEGATIVE 03/06/2023 0038   UROBILINOGEN 0.2 01/06/2012 1335   NITRITE NEGATIVE 03/06/2023 0038   LEUKOCYTESUR NEGATIVE 03/11/2023 0038    Sepsis Labs: Lactic Acid, Venous No results found for: "LATICACIDVEN"  MICROBIOLOGY: Recent Results (from the past 240 hour(s))  MRSA Next Gen by PCR, Nasal     Status: None   Collection Time: 03/24/2023  5:09 AM   Specimen: Nasal Mucosa; Nasal Swab  Result Value Ref Range Status   MRSA by PCR Next Gen NOT DETECTED NOT DETECTED Final    Comment: (NOTE) The GeneXpert MRSA Assay (FDA approved for NASAL specimens only), is one component of a comprehensive MRSA colonization surveillance program. It is not intended to diagnose MRSA infection nor to guide or monitor treatment for MRSA infections. Test performance is not FDA approved in patients less than 50 years old. Performed at [facility], [address] [place], [place] [address]   Culture, blood (Routine X 2) w Reflex to ID Panel     Status: None (Preliminary result)   Collection Time: 02/24/2023  8:58 AM   Specimen: BLOOD LEFT HAND  Result Value Ref Range Status   Specimen Description BLOOD LEFT HAND  Final   Special Requests   Final    BOTTLES DRAWN  AEROBIC ONLY Blood Culture results may not be optimal due to an inadequate volume of blood received in culture bottles   Culture   Final    NO GROWTH < 24 HOURS Performed at [facility], [address], [place], [place] [address]    Report Status PENDING  Incomplete  Culture, blood (Routine X 2) w Reflex to ID Panel     Status: None (Preliminary result)   Collection Time: 02/27/2023  8:58 AM   Specimen: BLOOD LEFT HAND  Result Value Ref Range Status   Specimen Description BLOOD LEFT  HAND  Final   Special Requests   Final    BOTTLES DRAWN AEROBIC ONLY Blood Culture results may not be optimal due to an inadequate volume of blood received in culture bottles   Culture   Final    NO GROWTH < 24 HOURS Performed at [facility], [address], [place], [place] [address]    Report Status PENDING  Incomplete    RADIOLOGY STUDIES/RESULTS: CT Head Wo Contrast  Result Date: 03/14/2023 CLINICAL DATA:  Polytrauma, blunt, altered mental status EXAM: CT HEAD WITHOUT CONTRAST TECHNIQUE: Contiguous axial images were obtained from the base of the skull through the vertex without intravenous contrast. RADIATION DOSE REDUCTION: This exam was performed according to the departmental dose-optimization program which includes automated exposure control, adjustment of the mA and/or kV according to patient size and/or use of iterative reconstruction technique. COMPARISON:  11/13/2022 FINDINGS: Brain: Normal anatomic configuration. Parenchymal volume loss is commensurate with the patient's age. Stable moderate periventricular white matter changes are present likely reflecting the sequela of small vessel ischemia. Remote bilateral cerebellar infarcts are again noted. There is stable mild ventriculomegaly with asymmetric enlargement of the temporal horns of the lateral ventricles, similar to prior examination. No abnormal intra or extra-axial mass lesion or fluid collection. No abnormal mass effect or midline  shift. No evidence of acute intracranial hemorrhage or infarct. Vascular: No asymmetric hyperdense vasculature at the skull base. Skull: Intact Sinuses/Orbits: Paranasal sinuses are clear. Orbits are unremarkable. Other: Mastoid air cells and middle ear cavities are clear. IMPRESSION: 1. No acute intracranial abnormality. No calvarial fracture. 2. Stable senescent changes and remote bilateral cerebellar infarcts. 3. Stable mild ventriculomegaly with asymmetric enlargement of the temporal horns of the lateral ventricles. This has progressed since more remote prior examinations of 06/08/2019 and, while this may simply represent asymmetric central atrophy, changes of normal pressure hydrocephalus could appear similarly. Correlation with clinical examination is recommended. Electronically Signed   By: Helyn Numbers M.D.   On: 02/26/2023 01:24   DG Chest Portable 1 View  Result Date: 03/23/2023 CLINICAL DATA:  Altered mental status EXAM: PORTABLE CHEST 1 VIEW COMPARISON:  11/16/2022 FINDINGS: Lung volumes are small, but are stable since prior examination. Patchy left basilar infiltrate is again seen, not significantly changed since prior examination. No pneumothorax or pleural effusion. Cardiac size within normal limits. Pulmonary vascularity is normal. No acute bone abnormality. IMPRESSION: 1. Pulmonary hypoinflation. 2. Stable patchy left basilar infiltrate. Electronically Signed   By: Helyn Numbers M.D.   On: 02/24/2023 01:17     LOS: 1 day   Jeoffrey Massed, MD  [facility]    To contact the attending provider between 7A-7P or the covering provider during after hours 7P-7A, please log into the web site [email or web address] and access using universal [place] password for that web site. If you do not have the password, please call the hospital operator.  03/23/2023, 8:25 AM

## 2023-03-23 NOTE — Plan of Care (Signed)
  Problem: Education: Goal: Knowledge of General Education information will improve Description: Including pain rating scale, medication(s)/side effects and non-pharmacologic comfort measures Outcome: Progressing   Problem: Health Behavior/Discharge Planning: Goal: Ability to manage health-related needs will improve Outcome: Progressing   Problem: Health Behavior/Discharge Planning: Goal: Ability to manage health-related needs will improve Outcome: Progressing   Problem: Clinical Measurements: Goal: Ability to maintain clinical measurements within normal limits will improve Outcome: Progressing Goal: Will remain free from infection Outcome: Progressing Goal: Diagnostic test results will improve Outcome: Progressing Goal: Respiratory complications will improve Outcome: Progressing Goal: Cardiovascular complication will be avoided Outcome: Progressing   Problem: Activity: Goal: Risk for activity intolerance will decrease Outcome: Progressing   Problem: Nutrition: Goal: Adequate nutrition will be maintained Outcome: Progressing   Problem: Coping: Goal: Level of anxiety will decrease Outcome: Progressing   Problem: Elimination: Goal: Will not experience complications related to bowel motility Outcome: Progressing Goal: Will not experience complications related to urinary retention Outcome: Progressing   Problem: Pain Managment: Goal: General experience of comfort will improve Outcome: Progressing   Problem: Safety: Goal: Ability to remain free from injury will improve Outcome: Progressing   Problem: Skin Integrity: Goal: Risk for impaired skin integrity will decrease Outcome: Progressing   Problem: Education: Goal: Knowledge of the prescribed therapeutic regimen will improve Outcome: Progressing   Problem: Coping: Goal: Ability to identify and develop effective coping behavior will improve Outcome: Progressing   Problem: Clinical Measurements: Goal: Quality  of life will improve Outcome: Progressing   Problem: Respiratory: Goal: Verbalizations of increased ease of respirations will increase Outcome: Progressing   Problem: Role Relationship: Goal: Family's ability to cope with current situation will improve Outcome: Progressing Goal: Ability to verbalize concerns, feelings, and thoughts to partner or family member will improve Outcome: Progressing   Problem: Pain Management: Goal: Satisfaction with pain management regimen will improve Outcome: Progressing

## 2023-03-24 DIAGNOSIS — N179 Acute kidney failure, unspecified: Secondary | ICD-10-CM | POA: Diagnosis not present

## 2023-03-24 LAB — CULTURE, BLOOD (ROUTINE X 2)

## 2023-03-24 NOTE — Progress Notes (Addendum)
                       PROGRESS NOTE        PATIENT DETAILS Name: Shane Juarez. Age: 89 y.o. Sex: male Date of Birth: 09/11/1930 Admit Date: 03/16/2023 Admitting Physician Debby Crosley, MD PCP:No primary care provider on file.  Brief Summary: Patient is a 89 y.o.  male-resident of SNF-advanced dementia-mostly bed to wheelchair bound-presented with acute metabolic encephalopathy in the setting of aspiration pneumonia, AKI and hypernatremia.  Significant events: 6/28>> admit to [facility] 6/29>> remains obtunded/hypothermic-worsening AKI-persistently hyponatremic-transition to full comfort measures.  Significant studies: 6/28>> CT head: No acute abnormality 6/28>> patchy left basilar infiltrate  Significant microbiology data: 6/28>> blood culture: Negative  Procedures: None  Consults: None  Subjective: Patient in bed appears to be in no discomfort but unresponsive  Objective: Vitals: Blood pressure (!) 125/57, pulse 99, temperature 99.4 F (37.4 C), temperature source Axillary, resp. rate 14, height 5' 7" (1.702 m), weight 76.4 kg, SpO2 95 %.   Exam:  Gen Exam: Unresponsive HEENT:atraumatic, normocephalic Chest: Some transmitted upper airway sounds CVS:S1S2 regular Abdomen:soft non tender, non distended Extremities:no edema Neurology: Difficult exam-withdraws lower extremity to pain Skin: no rash    Assessment/Plan:  Note patient has been now transition to full comfort measures, expect hospital death, DNR.  All non comfort medications have been discontinued, other medical issues addressed earlier this admission are below.   Severe sepsis due to aspiration pneumonia with AKI/metabolic encephalopathy  Deteriorating in spite of being on IV fluids/IV antibiotics-persistently hypothermic this morning-remains essentially unresponsive and accumulating secretions After speaking with daughter-transition to full comfort measures-stopping IVF/antibiotics.  AKI on CKD  stage IIIa Hypernatremia Secondary to dehydration in the setting of sepsis/poor oral intake Comfort care-stopping IV fluids  Acute metabolic encephalopathy Secondary to AKI/hyper natremia Comfort care-stopping IV fluids No plans to check/follow electrolytes.  History of CVA  History of dementia with behavioral disturbance SNF resident-poor functional baseline-mostly bedbound and dependent on nursing staff for ADLs  Palliative care DNR in place Now on full comfort measures, expect hospital death in the next few days  Pressure Ulcer: Pressure Injury 02/23/2023 Ischial tuberosity Left;Right Stage 1 -  Intact skin with non-blanchable redness of a localized area usually over a bony prominence. Redened,blachable areas on bilateral hip bone,painful to touch (Active)  03/21/2023 0510  Location: Ischial tuberosity  Location Orientation: Left;Right  Staging: Stage 1 -  Intact skin with non-blanchable redness of a localized area usually over a bony prominence.  Wound Description (Comments): Redened,blachable areas on bilateral hip bone,painful to touch  Present on Admission: Yes  Dressing Type Foam - Lift dressing to assess site every shift 03/23/23 2100     Pressure Injury 03/20/2023 Ischial tuberosity Right Stage 1 -  Intact skin with non-blanchable redness of a localized area usually over a bony prominence. Reddened areas on the hip bone. Blanchable and painful to touch (Active)  03/06/2023   Location: Ischial tuberosity  Location Orientation: Right  Staging: Stage 1 -  Intact skin with non-blanchable redness of a localized area usually over a bony prominence.  Wound Description (Comments): Reddened areas on the hip bone. Blanchable and painful to touch  Present on Admission: Yes  Dressing Type Foam - Lift dressing to assess site every shift 03/23/23 2100    BMI: Estimated body mass index is 26.38 kg/m as calculated from the following:   Height as of this encounter: 5' 7" (1.702 m).      Weight as of this encounter: 76.4 kg.   Code status:   Code Status: DNR   DVT Prophylaxis:Not needed-comfort care   Family Communication: Daughter-[name]-[phone] updated over the phone.   Disposition Plan: Status is: Inpatient Remains inpatient appropriate because: Severity of illness   Planned Discharge Destination: In-hospital death   Diet: Diet Order             Diet NPO time specified  Diet effective now                     Antimicrobial agents: Anti-infectives (From admission, onward)    Start     Dose/Rate Route Frequency Ordered Stop   03/10/2023 2200  cefTRIAXone (ROCEPHIN) 2 g in sodium chloride 0.9 % 100 mL IVPB  Status:  Discontinued        2 g 200 mL/hr over 30 Minutes Intravenous Every 24 hours 03/08/2023 1338 03/23/23 0805   02/27/2023 1430  azithromycin (ZITHROMAX) 500 mg in sodium chloride 0.9 % 250 mL IVPB  Status:  Discontinued        500 mg 250 mL/hr over 60 Minutes Intravenous Every 24 hours 03/15/2023 1338 03/23/23 0805   03/16/2023 0815  vancomycin (VANCOREADY) IVPB 1250 mg/250 mL  Status:  Discontinued        1,250 mg 166.7 mL/hr over 90 Minutes Intravenous Every 48 hours 02/24/2023 0715 03/02/2023 1335   03/06/2023 0715  piperacillin-tazobactam (ZOSYN) IVPB 3.375 g        3.375 g 12.5 mL/hr over 240 Minutes Intravenous Every 8 hours 03/18/2023 0713 03/07/2023 1530   03/12/2023 0415  cefTRIAXone (ROCEPHIN) 1 g in sodium chloride 0.9 % 100 mL IVPB  Status:  Discontinued        1 g 200 mL/hr over 30 Minutes Intravenous Every 24 hours 03/02/2023 0414 03/03/2023 0655        MEDICATIONS: Scheduled Meds:   HYDROmorphone (DILAUDID) injection  0.5 mg Intravenous Q6H   sodium chloride flush  3 mL Intravenous Q12H   Continuous Infusions:  sodium chloride     PRN Meds:.sodium chloride, acetaminophen **OR** acetaminophen, albuterol, glycopyrrolate **OR** glycopyrrolate **OR** glycopyrrolate, haloperidol **OR** haloperidol **OR** haloperidol lactate, HYDROmorphone  (DILAUDID) injection, LORazepam **OR** LORazepam **OR** LORazepam, ondansetron **OR** ondansetron (ZOFRAN) IV, polyvinyl alcohol, sodium chloride flush   I have personally reviewed following labs and imaging studies  LABORATORY DATA: CBC: Recent Labs  Lab 03/23/2023 0038 03/02/2023 0858 03/23/23 0600  WBC 8.0 7.4 21.8*  NEUTROABS 6.6 5.9  --   HGB 11.0* 10.9* 11.0*  HCT 35.4* 34.7* 33.9*  MCV 99.4 98.0 96.3  PLT 134* 135* 111*    Basic Metabolic Panel: Recent Labs  Lab 03/16/2023 0142 02/23/2023 0858 03/09/2023 1630 03/23/23 0600  NA 151* 152* 152* 152*  K 5.1 5.1 4.8 5.6*  CL 119* 120* 120* 116*  CO2 24 23 22 20*  GLUCOSE 98 100* 95 199*  BUN 59* 59* 61* 70*  CREATININE 2.23* 2.13* 2.18* 2.85*  CALCIUM 8.6* 8.4* 8.4* 8.1*  MG  --   --   --  2.5*    GFR: Estimated Creatinine Clearance: 15.8 mL/min (A) (by C-G formula based on SCr of 2.85 mg/dL (H)).  Liver Function Tests: Recent Labs  Lab 02/28/2023 0142 03/23/23 0600  AST 37 44*  ALT 54* 45*  ALKPHOS 112 104  BILITOT 0.5 0.2*  PROT 7.1 6.5  ALBUMIN 2.7* 2.4*   No results for input(s): "LIPASE", "AMYLASE" in the last 168 hours. No results for input(s): "  AMMONIA" in the last 168 hours.  Coagulation Profile: No results for input(s): "INR", "PROTIME" in the last 168 hours.  Cardiac Enzymes: No results for input(s): "CKTOTAL", "CKMB", "CKMBINDEX", "TROPONINI" in the last 168 hours.  BNP (last 3 results) No results for input(s): "PROBNP" in the last 8760 hours.  Lipid Profile: No results for input(s): "CHOL", "HDL", "LDLCALC", "TRIG", "CHOLHDL", "LDLDIRECT" in the last 72 hours.  Thyroid Function Tests: No results for input(s): "TSH", "T4TOTAL", "FREET4", "T3FREE", "THYROIDAB" in the last 72 hours.  Anemia Panel: No results for input(s): "VITAMINB12", "FOLATE", "FERRITIN", "TIBC", "IRON", "RETICCTPCT" in the last 72 hours.  Urine analysis:    Component Value Date/Time   COLORURINE YELLOW 03/21/2023 0038    APPEARANCEUR HAZY (A) 02/27/2023 0038   LABSPEC 1.020 03/04/2023 0038   PHURINE 5.0 03/20/2023 0038   GLUCOSEU NEGATIVE 02/28/2023 0038   HGBUR NEGATIVE 03/13/2023 0038   BILIRUBINUR NEGATIVE 03/19/2023 0038   KETONESUR NEGATIVE 02/23/2023 0038   PROTEINUR NEGATIVE 02/27/2023 0038   UROBILINOGEN 0.2 01/06/2012 1335   NITRITE NEGATIVE 02/26/2023 0038   LEUKOCYTESUR NEGATIVE 03/15/2023 0038    Sepsis Labs: Lactic Acid, Venous No results found for: "LATICACIDVEN"  MICROBIOLOGY: Recent Results (from the past 240 hour(s))  MRSA Next Gen by PCR, Nasal     Status: None   Collection Time: 03/03/2023  5:09 AM   Specimen: Nasal Mucosa; Nasal Swab  Result Value Ref Range Status   MRSA by PCR Next Gen NOT DETECTED NOT DETECTED Final    Comment: (NOTE) The GeneXpert MRSA Assay (FDA approved for NASAL specimens only), is one component of a comprehensive MRSA colonization surveillance program. It is not intended to diagnose MRSA infection nor to guide or monitor treatment for MRSA infections. Test performance is not FDA approved in patients less than 2 years old. Performed at [facility], [address] [place], [place] [address]   Culture, blood (Routine X 2) w Reflex to ID Panel     Status: None (Preliminary result)   Collection Time: 03/15/2023  8:58 AM   Specimen: BLOOD LEFT HAND  Result Value Ref Range Status   Specimen Description BLOOD LEFT HAND  Final   Special Requests   Final    BOTTLES DRAWN AEROBIC ONLY Blood Culture results may not be optimal due to an inadequate volume of blood received in culture bottles   Culture   Final    NO GROWTH < 24 HOURS Performed at [facility], [address] [place], [place] [address]    Report Status PENDING  Incomplete  Culture, blood (Routine X 2) w Reflex to ID Panel     Status: None (Preliminary result)   Collection Time: 03/24/2023  8:58 AM   Specimen: BLOOD LEFT HAND  Result Value Ref Range Status   Specimen Description  BLOOD LEFT HAND  Final   Special Requests   Final    BOTTLES DRAWN AEROBIC ONLY Blood Culture results may not be optimal due to an inadequate volume of blood received in culture bottles   Culture   Final    NO GROWTH < 24 HOURS Performed at  Hospital Lab, [address] [place], [place] [address]    Report Status PENDING  Incomplete    RADIOLOGY STUDIES/RESULTS: No results found.   LOS: 2 days   Signature  -    Badr Piedra M.D on 03/24/2023 at 8:12 AM   -  To page go to [email or web address]        

## 2023-03-25 DIAGNOSIS — N179 Acute kidney failure, unspecified: Secondary | ICD-10-CM | POA: Diagnosis not present

## 2023-03-25 LAB — CULTURE, BLOOD (ROUTINE X 2)

## 2023-03-25 NOTE — Progress Notes (Signed)
Nutrition Brief Note  Chart reviewed, MST score >/= 2 and NPO. Pt transitioning to comfort care.  No further nutrition interventions planned at this time.  Please re-consult as needed.   Romelle Starcher MS, RDN, LDN, CNSC Registered Dietitian 3 Clinical Nutrition RD Pager and On-Call Pager Number Located in [place]

## 2023-03-25 NOTE — Care Management Important Message (Signed)
Important Message  Patient Details  Name: Shane FRASCA Sr. MRN: [id number] Date of Birth: 1930/04/20   Medicare Important Message Given:  Yes     Andrea Ferrer 03/25/2023, 2:49 PM

## 2023-03-25 NOTE — Progress Notes (Signed)
PROGRESS NOTE        PATIENT DETAILS Name: Shane ROSENCRANCE Sr. Age: 89 y.o. Sex: male Date of Birth: March 10, 1930 Admit Date: 02/26/2023 Admitting Physician Gery Pray, MD PCP:No primary care provider on file.  Brief Summary: Patient is a 89 y.o.  male-resident of SNF-advanced dementia-mostly bed to wheelchair bound-presented with acute metabolic encephalopathy in the setting of aspiration pneumonia, AKI and hypernatremia.  Significant events: 6/28>> admit to [facility] 6/29>> remains obtunded/hypothermic-worsening AKI-persistently hyponatremic-transition to full comfort measures.  Significant studies: 6/28>> CT head: No acute abnormality 6/28>> patchy left basilar infiltrate  Significant microbiology data: 6/28>> blood culture: Negative  Procedures: None  Consults: None  Subjective: Patient in bed appears to be in no discomfort but unresponsive  Objective: Vitals: Blood pressure (!) 125/57, pulse 99, temperature 99.4 F (37.4 C), temperature source Axillary, resp. rate 14, height 5\' 7"  (1.702 m), weight 76.4 kg, SpO2 95 %.   Exam:  Gen Exam: Unresponsive HEENT:atraumatic, normocephalic Chest: Some transmitted upper airway sounds CVS:S1S2 regular Abdomen:soft non tender, non distended Extremities:no edema Neurology: Difficult exam-withdraws lower extremity to pain Skin: no rash    Assessment/Plan:  Note patient has been now transition to full comfort measures, expect hospital death, DNR.  All non comfort medications have been discontinued, other medical issues addressed earlier this admission are below.   Severe sepsis due to aspiration pneumonia with AKI/metabolic encephalopathy  Deteriorating in spite of being on IV fluids/IV antibiotics-persistently hypothermic this morning-remains essentially unresponsive and accumulating secretions After speaking with daughter-transition to full comfort measures-stopping IVF/antibiotics.  AKI on CKD  stage IIIa Hypernatremia Secondary to dehydration in the setting of sepsis/poor oral intake Comfort care-stopping IV fluids  Acute metabolic encephalopathy Secondary to AKI/hyper natremia Comfort care-stopping IV fluids No plans to check/follow electrolytes.  History of CVA  History of dementia with behavioral disturbance SNF resident-poor functional baseline-mostly bedbound and dependent on nursing staff for ADLs  Palliative care DNR in place Now on full comfort measures, expect hospital death in the next few days  Pressure Ulcer: Pressure Injury 03/06/2023 Ischial tuberosity Left;Right Stage 1 -  Intact skin with non-blanchable redness of a localized area usually over a bony prominence. Redened,blachable areas on bilateral hip bone,painful to touch (Active)  03/21/2023 0510  Location: Ischial tuberosity  Location Orientation: Left;Right  Staging: Stage 1 -  Intact skin with non-blanchable redness of a localized area usually over a bony prominence.  Wound Description (Comments): Redened,blachable areas on bilateral hip bone,painful to touch  Present on Admission: Yes  Dressing Type Foam - Lift dressing to assess site every shift 03/23/23 2100     Pressure Injury 03/07/2023 Ischial tuberosity Right Stage 1 -  Intact skin with non-blanchable redness of a localized area usually over a bony prominence. Reddened areas on the hip bone. Blanchable and painful to touch (Active)  03/24/2023   Location: Ischial tuberosity  Location Orientation: Right  Staging: Stage 1 -  Intact skin with non-blanchable redness of a localized area usually over a bony prominence.  Wound Description (Comments): Reddened areas on the hip bone. Blanchable and painful to touch  Present on Admission: Yes  Dressing Type Foam - Lift dressing to assess site every shift 03/23/23 2100    BMI: Estimated body mass index is 26.38 kg/m as calculated from the following:   Height as of this encounter: 5\' 7"  (1.702 m).  Weight as of this encounter: 76.4 kg.   Code status:   Code Status: DNR   DVT Prophylaxis:Not needed-comfort care   Family Communication: Daughter-[name] updated over the phone.   Disposition Plan: Status is: Inpatient Remains inpatient appropriate because: Severity of illness   Planned Discharge Destination: In-hospital death   Diet: Diet Order             Diet NPO time specified  Diet effective now                     Antimicrobial agents: Anti-infectives (From admission, onward)    Start     Dose/Rate Route Frequency Ordered Stop   03/10/2023 2200  cefTRIAXone (ROCEPHIN) 2 g in sodium chloride 0.9 % 100 mL IVPB  Status:  Discontinued        2 g 200 mL/hr over 30 Minutes Intravenous Every 24 hours 03/07/2023 1338 03/23/23 0805   02/23/2023 1430  azithromycin (ZITHROMAX) 500 mg in sodium chloride 0.9 % 250 mL IVPB  Status:  Discontinued        500 mg 250 mL/hr over 60 Minutes Intravenous Every 24 hours 03/04/2023 1338 03/23/23 0805   03/01/2023 0815  vancomycin (VANCOREADY) IVPB 1250 mg/250 mL  Status:  Discontinued        1,250 mg 166.7 mL/hr over 90 Minutes Intravenous Every 48 hours 03/14/2023 0715 03/05/2023 1335   03/11/2023 0715  piperacillin-tazobactam (ZOSYN) IVPB 3.375 g        3.375 g 12.5 mL/hr over 240 Minutes Intravenous Every 8 hours 02/28/2023 0713 03/05/2023 1530   03/23/2023 0415  cefTRIAXone (ROCEPHIN) 1 g in sodium chloride 0.9 % 100 mL IVPB  Status:  Discontinued        1 g 200 mL/hr over 30 Minutes Intravenous Every 24 hours 03/04/2023 0414 03/21/2023 0655        MEDICATIONS: Scheduled Meds:   HYDROmorphone (DILAUDID) injection  0.5 mg Intravenous Q6H   sodium chloride flush  3 mL Intravenous Q12H   Continuous Infusions:  sodium chloride     PRN Meds:.sodium chloride, acetaminophen **OR** acetaminophen, albuterol, glycopyrrolate **OR** glycopyrrolate **OR** glycopyrrolate, haloperidol **OR** haloperidol **OR** haloperidol lactate, HYDROmorphone  (DILAUDID) injection, LORazepam **OR** LORazepam **OR** LORazepam, ondansetron **OR** ondansetron (ZOFRAN) IV, polyvinyl alcohol, sodium chloride flush   I have personally reviewed following labs and imaging studies  LABORATORY DATA: CBC: Recent Labs  Lab 03/04/2023 0038 03/11/2023 0858 03/23/23 0600  WBC 8.0 7.4 21.8*  NEUTROABS 6.6 5.9  --   HGB 11.0* 10.9* 11.0*  HCT 35.4* 34.7* 33.9*  MCV 99.4 98.0 96.3  PLT 134* 135* 111*    Basic Metabolic Panel: Recent Labs  Lab 03/23/2023 0142 03/20/2023 0858 03/06/2023 1630 03/23/23 0600  NA 151* 152* 152* 152*  K 5.1 5.1 4.8 5.6*  CL 119* 120* 120* 116*  CO2 24 23 22  20*  GLUCOSE 98 100* 95 199*  BUN 59* 59* 61* 70*  CREATININE 2.23* 2.13* 2.18* 2.85*  CALCIUM 8.6* 8.4* 8.4* 8.1*  MG  --   --   --  2.5*    GFR: Estimated Creatinine Clearance: 15.8 mL/min (A) (by C-G formula based on SCr of 2.85 mg/dL (H)).  Liver Function Tests: Recent Labs  Lab 02/23/2023 0142 03/23/23 0600  AST 37 44*  ALT 54* 45*  ALKPHOS 112 104  BILITOT 0.5 0.2*  PROT 7.1 6.5  ALBUMIN 2.7* 2.4*   No results for input(s): "LIPASE", "AMYLASE" in the last 168 hours. No results for input(s): "  AMMONIA" in the last 168 hours.  Coagulation Profile: No results for input(s): "INR", "PROTIME" in the last 168 hours.  Cardiac Enzymes: No results for input(s): "CKTOTAL", "CKMB", "CKMBINDEX", "TROPONINI" in the last 168 hours.  BNP (last 3 results) No results for input(s): "PROBNP" in the last 8760 hours.  Lipid Profile: No results for input(s): "CHOL", "HDL", "LDLCALC", "TRIG", "CHOLHDL", "LDLDIRECT" in the last 72 hours.  Thyroid Function Tests: No results for input(s): "TSH", "T4TOTAL", "FREET4", "T3FREE", "THYROIDAB" in the last 72 hours.  Anemia Panel: No results for input(s): "VITAMINB12", "FOLATE", "FERRITIN", "TIBC", "IRON", "RETICCTPCT" in the last 72 hours.  Urine analysis:    Component Value Date/Time   COLORURINE YELLOW 03/13/2023 0038    APPEARANCEUR HAZY (A) 02/27/2023 0038   LABSPEC 1.020 03/20/2023 0038   PHURINE 5.0 03/07/2023 0038   GLUCOSEU NEGATIVE 03/21/2023 0038   HGBUR NEGATIVE 03/15/2023 0038   BILIRUBINUR NEGATIVE 03/19/2023 0038   KETONESUR NEGATIVE 03/24/2023 0038   PROTEINUR NEGATIVE 03/14/2023 0038   UROBILINOGEN 0.2 01/06/2012 1335   NITRITE NEGATIVE 02/26/2023 0038   LEUKOCYTESUR NEGATIVE 02/25/2023 0038    Sepsis Labs: Lactic Acid, Venous No results found for: "LATICACIDVEN"  MICROBIOLOGY: Recent Results (from the past 240 hour(s))  MRSA Next Gen by PCR, Nasal     Status: None   Collection Time: 03/24/2023  5:09 AM   Specimen: Nasal Mucosa; Nasal Swab  Result Value Ref Range Status   MRSA by PCR Next Gen NOT DETECTED NOT DETECTED Final    Comment: (NOTE) The GeneXpert MRSA Assay (FDA approved for NASAL specimens only), is one component of a comprehensive MRSA colonization surveillance program. It is not intended to diagnose MRSA infection nor to guide or monitor treatment for MRSA infections. Test performance is not FDA approved in patients less than 1 years old. Performed at [facility], [address] [place], [place] [address]   Culture, blood (Routine X 2) w Reflex to ID Panel     Status: None (Preliminary result)   Collection Time: 03/04/2023  8:58 AM   Specimen: BLOOD LEFT HAND  Result Value Ref Range Status   Specimen Description BLOOD LEFT HAND  Final   Special Requests   Final    BOTTLES DRAWN AEROBIC ONLY Blood Culture results may not be optimal due to an inadequate volume of blood received in culture bottles   Culture   Final    NO GROWTH < 24 HOURS Performed at [facility], [address], [place], [place] [address]    Report Status PENDING  Incomplete  Culture, blood (Routine X 2) w Reflex to ID Panel     Status: None (Preliminary result)   Collection Time: 02/25/2023  8:58 AM   Specimen: BLOOD LEFT HAND  Result Value Ref Range Status   Specimen Description  BLOOD LEFT HAND  Final   Special Requests   Final    BOTTLES DRAWN AEROBIC ONLY Blood Culture results may not be optimal due to an inadequate volume of blood received in culture bottles   Culture   Final    NO GROWTH < 24 HOURS Performed at [facility], [address], [place], [place] [address]    Report Status PENDING  Incomplete    RADIOLOGY STUDIES/RESULTS: No results found.   LOS: 2 days   Signature  -    Susa Raring M.D on 03/24/2023 at 8:12 AM   -  To page go to [email or web address]

## 2023-03-25 DEATH — deceased

## 2023-03-26 DIAGNOSIS — N179 Acute kidney failure, unspecified: Secondary | ICD-10-CM | POA: Diagnosis not present

## 2023-03-26 LAB — CULTURE, BLOOD (ROUTINE X 2)

## 2023-03-26 NOTE — Progress Notes (Signed)
PROGRESS NOTE        PATIENT DETAILS Name: Shane DIMUZIO Sr. Age: 89 y.o. Sex: male Date of Birth: 10-27-1929 Admit Date: 03/14/2023 Admitting Physician Gery Pray, MD PCP:No primary care provider on file.  Brief Summary: Patient is a 89 y.o.  male-resident of SNF-advanced dementia-mostly bed to wheelchair bound-presented with acute metabolic encephalopathy in the setting of aspiration pneumonia, AKI and hypernatremia.  Significant events: 6/28>> admit to [facility] 6/29>> remains obtunded/hypothermic-worsening AKI-persistently hyponatremic-transition to full comfort measures.  Significant studies: 6/28>> CT head: No acute abnormality 6/28>> patchy left basilar infiltrate  Significant microbiology data: 6/28>> blood culture: Negative  Procedures: None  Consults: None  Subjective: Patient in bed appears to be in no discomfort but unresponsive  Objective: Vitals: Blood pressure (!) 126/56, pulse (!) 114, temperature 99.9 F (37.7 C), temperature source Axillary, resp. rate 15, height 5\' 7"  (1.702 m), weight 76.4 kg, SpO2 (!) 87 %.   Exam:  Gen Exam: Unresponsive HEENT:atraumatic, normocephalic Chest: Some transmitted upper airway sounds CVS:S1S2 regular Abdomen:soft non tender, non distended Extremities:no edema Neurology: Difficult exam-withdraws lower extremity to pain Skin: no rash    Assessment/Plan:  Note patient has been now transition to full comfort measures, expect hospital death, DNR.  All non comfort medications have been discontinued, other medical issues addressed earlier this admission are below.   Severe sepsis due to aspiration pneumonia with AKI/metabolic encephalopathy  Deteriorating in spite of being on IV fluids/IV antibiotics-persistently hypothermic this morning-remains essentially unresponsive and accumulating secretions After speaking with daughter-transition to full comfort measures-stopping IVF/antibiotics.  AKI  on CKD stage IIIa Hypernatremia Secondary to dehydration in the setting of sepsis/poor oral intake Comfort care-stopping IV fluids  Acute metabolic encephalopathy Secondary to AKI/hyper natremia Comfort care-stopping IV fluids No plans to check/follow electrolytes.  History of CVA  History of dementia with behavioral disturbance SNF resident-poor functional baseline-mostly bedbound and dependent on nursing staff for ADLs  Palliative care DNR in place Now on full comfort measures, expect hospital death in the next few days  Pressure Ulcer: Pressure Injury 03/14/2023 Ischial tuberosity Left;Right Stage 1 -  Intact skin with non-blanchable redness of a localized area usually over a bony prominence. Redened,blachable areas on bilateral hip bone,painful to touch (Active)  03/03/2023 0510  Location: Ischial tuberosity  Location Orientation: Left;Right  Staging: Stage 1 -  Intact skin with non-blanchable redness of a localized area usually over a bony prominence.  Wound Description (Comments): Redened,blachable areas on bilateral hip bone,painful to touch  Present on Admission: Yes  Dressing Type Foam - Lift dressing to assess site every shift 03/26/23 0800     Pressure Injury 03/14/2023 Ischial tuberosity Right Stage 1 -  Intact skin with non-blanchable redness of a localized area usually over a bony prominence. Reddened areas on the hip bone. Blanchable and painful to touch (Active)  03/21/2023   Location: Ischial tuberosity  Location Orientation: Right  Staging: Stage 1 -  Intact skin with non-blanchable redness of a localized area usually over a bony prominence.  Wound Description (Comments): Reddened areas on the hip bone. Blanchable and painful to touch  Present on Admission: Yes  Dressing Type Foam - Lift dressing to assess site every shift 03/26/23 0800    BMI: Estimated body mass index is 26.38 kg/m as calculated from the following:   Height as of this encounter: 5\' 7"  (1.702  m).   Weight as of this encounter: 76.4 kg.   Code status:   Code Status: DNR   DVT Prophylaxis:Not needed-comfort care   Family Communication: Daughter-[name]-[phone] updated over the phone.   Disposition Plan: Status is: Inpatient Remains inpatient appropriate because: Severity of illness   Planned Discharge Destination: In-hospital death   Diet: Diet Order             Diet NPO time specified  Diet effective now                     Antimicrobial agents: Anti-infectives (From admission, onward)    Start     Dose/Rate Route Frequency Ordered Stop   03/08/2023 2200  cefTRIAXone (ROCEPHIN) 2 g in sodium chloride 0.9 % 100 mL IVPB  Status:  Discontinued        2 g 200 mL/hr over 30 Minutes Intravenous Every 24 hours 03/08/2023 1338 03/23/23 0805   03/13/2023 1430  azithromycin (ZITHROMAX) 500 mg in sodium chloride 0.9 % 250 mL IVPB  Status:  Discontinued        500 mg 250 mL/hr over 60 Minutes Intravenous Every 24 hours 03/03/2023 1338 03/23/23 0805   03/09/2023 0815  vancomycin (VANCOREADY) IVPB 1250 mg/250 mL  Status:  Discontinued        1,250 mg 166.7 mL/hr over 90 Minutes Intravenous Every 48 hours 03/06/2023 0715 03/23/2023 1335   03/08/2023 0715  piperacillin-tazobactam (ZOSYN) IVPB 3.375 g        3.375 g 12.5 mL/hr over 240 Minutes Intravenous Every 8 hours 02/25/2023 0713 03/12/2023 1530   03/18/2023 0415  cefTRIAXone (ROCEPHIN) 1 g in sodium chloride 0.9 % 100 mL IVPB  Status:  Discontinued        1 g 200 mL/hr over 30 Minutes Intravenous Every 24 hours 03/04/2023 0414 02/23/2023 0655        MEDICATIONS: Scheduled Meds:   HYDROmorphone (DILAUDID) injection  0.5 mg Intravenous Q6H   sodium chloride flush  3 mL Intravenous Q12H   Continuous Infusions:  sodium chloride     PRN Meds:.sodium chloride, acetaminophen **OR** acetaminophen, albuterol, glycopyrrolate **OR** glycopyrrolate **OR** glycopyrrolate, haloperidol **OR** haloperidol **OR** haloperidol lactate,  HYDROmorphone (DILAUDID) injection, LORazepam **OR** LORazepam **OR** LORazepam, ondansetron **OR** ondansetron (ZOFRAN) IV, polyvinyl alcohol, sodium chloride flush   I have personally reviewed following labs and imaging studies  LABORATORY DATA: CBC: Recent Labs  Lab 02/25/2023 0038 03/04/2023 0858 03/23/23 0600  WBC 8.0 7.4 21.8*  NEUTROABS 6.6 5.9  --   HGB 11.0* 10.9* 11.0*  HCT 35.4* 34.7* 33.9*  MCV 99.4 98.0 96.3  PLT 134* 135* 111*    Basic Metabolic Panel: Recent Labs  Lab 03/07/2023 0142 02/24/2023 0858 03/21/2023 1630 03/23/23 0600  NA 151* 152* 152* 152*  K 5.1 5.1 4.8 5.6*  CL 119* 120* 120* 116*  CO2 24 23 22  20*  GLUCOSE 98 100* 95 199*  BUN 59* 59* 61* 70*  CREATININE 2.23* 2.13* 2.18* 2.85*  CALCIUM 8.6* 8.4* 8.4* 8.1*  MG  --   --   --  2.5*    GFR: Estimated Creatinine Clearance: 15.8 mL/min (A) (by C-G formula based on SCr of 2.85 mg/dL (H)).  Liver Function Tests: Recent Labs  Lab 03/17/2023 0142 03/23/23 0600  AST 37 44*  ALT 54* 45*  ALKPHOS 112 104  BILITOT 0.5 0.2*  PROT 7.1 6.5  ALBUMIN 2.7* 2.4*   No results for input(s): "LIPASE", "AMYLASE" in the last 168 hours. No  results for input(s): "AMMONIA" in the last 168 hours.  Coagulation Profile: No results for input(s): "INR", "PROTIME" in the last 168 hours.  Cardiac Enzymes: No results for input(s): "CKTOTAL", "CKMB", "CKMBINDEX", "TROPONINI" in the last 168 hours.  BNP (last 3 results) No results for input(s): "PROBNP" in the last 8760 hours.  Lipid Profile: No results for input(s): "CHOL", "HDL", "LDLCALC", "TRIG", "CHOLHDL", "LDLDIRECT" in the last 72 hours.  Thyroid Function Tests: No results for input(s): "TSH", "T4TOTAL", "FREET4", "T3FREE", "THYROIDAB" in the last 72 hours.  Anemia Panel: No results for input(s): "VITAMINB12", "FOLATE", "FERRITIN", "TIBC", "IRON", "RETICCTPCT" in the last 72 hours.  Urine analysis:    Component Value Date/Time   COLORURINE YELLOW  03/17/2023 0038   APPEARANCEUR HAZY (A) 03/02/2023 0038   LABSPEC 1.020 03/15/2023 0038   PHURINE 5.0 03/23/2023 0038   GLUCOSEU NEGATIVE 02/24/2023 0038   HGBUR NEGATIVE 03/20/2023 0038   BILIRUBINUR NEGATIVE 03/01/2023 0038   KETONESUR NEGATIVE 03/13/2023 0038   PROTEINUR NEGATIVE 03/09/2023 0038   UROBILINOGEN 0.2 01/06/2012 1335   NITRITE NEGATIVE 03/04/2023 0038   LEUKOCYTESUR NEGATIVE 03/13/2023 0038    Sepsis Labs: Lactic Acid, Venous No results found for: "LATICACIDVEN"  MICROBIOLOGY: Recent Results (from the past 240 hour(s))  MRSA Next Gen by PCR, Nasal     Status: None   Collection Time: 03/04/2023  5:09 AM   Specimen: Nasal Mucosa; Nasal Swab  Result Value Ref Range Status   MRSA by PCR Next Gen NOT DETECTED NOT DETECTED Final    Comment: (NOTE) The GeneXpert MRSA Assay (FDA approved for NASAL specimens only), is one component of a comprehensive MRSA colonization surveillance program. It is not intended to diagnose MRSA infection nor to guide or monitor treatment for MRSA infections. Test performance is not FDA approved in patients less than 17 years old. Performed at [facility], [address], [place], [place] [address]   Culture, blood (Routine X 2) w Reflex to ID Panel     Status: None (Preliminary result)   Collection Time: 03/11/2023  8:58 AM   Specimen: BLOOD LEFT HAND  Result Value Ref Range Status   Specimen Description BLOOD LEFT HAND  Final   Special Requests   Final    BOTTLES DRAWN AEROBIC ONLY Blood Culture results may not be optimal due to an inadequate volume of blood received in culture bottles   Culture   Final    NO GROWTH 3 DAYS Performed at [facility], [address], [place], [place] [address]    Report Status PENDING  Incomplete  Culture, blood (Routine X 2) w Reflex to ID Panel     Status: None (Preliminary result)   Collection Time: 03/05/2023  8:58 AM   Specimen: BLOOD LEFT HAND  Result Value Ref Range Status   Specimen  Description BLOOD LEFT HAND  Final   Special Requests   Final    BOTTLES DRAWN AEROBIC ONLY Blood Culture results may not be optimal due to an inadequate volume of blood received in culture bottles   Culture   Final    NO GROWTH 3 DAYS Performed at [facility], [address] [place], [place] [address]    Report Status PENDING  Incomplete    RADIOLOGY STUDIES/RESULTS: No results found.   LOS: 4 days   Signature  -    Susa Raring M.D on 03/26/2023 at 9:50 AM   -  To page go to [email or web address]

## 2023-03-26 NOTE — Progress Notes (Signed)
RN received report and assumed care for PT. Pt is nonverbal. Pt eyes closed and breathing in mildly labored but even. Pt repositioned for comfort and HOB elevated. Family is at bedside.

## 2023-03-27 DIAGNOSIS — N179 Acute kidney failure, unspecified: Secondary | ICD-10-CM | POA: Diagnosis not present

## 2023-03-27 LAB — CULTURE, BLOOD (ROUTINE X 2)

## 2023-03-27 MED ORDER — BIOTENE DRY MOUTH MT LIQD: 15.0000 mL | OROMUCOSAL | Status: DC | PRN

## 2023-03-27 NOTE — Progress Notes (Signed)
PROGRESS NOTE        PATIENT DETAILS Name: Shane SKELLIE Sr. Age: 89 y.o. Sex: male Date of Birth: Dec 14, 1929 Admit Date: 03/21/2023 Admitting Physician Gery Pray, MD PCP:No primary care provider on file.  Brief Summary: Patient is a 89 y.o.  male-resident of SNF-advanced dementia-mostly bed to wheelchair bound-presented with acute metabolic encephalopathy in the setting of aspiration pneumonia, AKI and hypernatremia.  Significant events: 6/28>> admit to [facility] 6/29>> remains obtunded/hypothermic-worsening AKI-persistently hyponatremic-transition to full comfort measures.  Significant studies: 6/28>> CT head: No acute abnormality 6/28>> patchy left basilar infiltrate  Significant microbiology data: 6/28>> blood culture: Negative  Procedures: None  Consults: None  Subjective: Patient in bed appears to be in no discomfort but unresponsive  Objective: Vitals: Blood pressure (!) 105/53, pulse (!) 101, temperature 98.7 F (37.1 C), temperature source Axillary, resp. rate 18, height 5\' 7"  (1.702 m), weight 76.4 kg, SpO2 90 %.   Exam:  Gen Exam: Unresponsive HEENT:atraumatic, normocephalic Chest: Some transmitted upper airway sounds CVS:S1S2 regular Abdomen:soft non tender, non distended Extremities:no edema Neurology: Difficult exam-withdraws lower extremity to pain Skin: no rash    Assessment/Plan:  Note patient has been now transition to full comfort measures, expect hospital death, DNR.  All non comfort medications have been discontinued, other medical issues addressed earlier this admission are below.   Severe sepsis due to aspiration pneumonia with AKI/metabolic encephalopathy  Deteriorating in spite of being on IV fluids/IV antibiotics-persistently hypothermic this morning-remains essentially unresponsive and accumulating secretions After speaking with daughter-transition to full comfort measures-stopping IVF/antibiotics.  AKI on  CKD stage IIIa Hypernatremia Secondary to dehydration in the setting of sepsis/poor oral intake Comfort care-stopping IV fluids  Acute metabolic encephalopathy Secondary to AKI/hyper natremia Comfort care-stopping IV fluids No plans to check/follow electrolytes.  History of CVA  History of dementia with behavioral disturbance SNF resident-poor functional baseline-mostly bedbound and dependent on nursing staff for ADLs  Palliative care DNR in place Now on full comfort measures, expect hospital death in the next few days  Pressure Ulcer: Pressure Injury 03/08/2023 Ischial tuberosity Left;Right Stage 1 -  Intact skin with non-blanchable redness of a localized area usually over a bony prominence. Redened,blachable areas on bilateral hip bone,painful to touch (Active)  02/23/2023 0510  Location: Ischial tuberosity  Location Orientation: Left;Right  Staging: Stage 1 -  Intact skin with non-blanchable redness of a localized area usually over a bony prominence.  Wound Description (Comments): Redened,blachable areas on bilateral hip bone,painful to touch  Present on Admission: Yes  Dressing Type Foam - Lift dressing to assess site every shift 03/27/23 0800     Pressure Injury 03/03/2023 Ischial tuberosity Right Stage 1 -  Intact skin with non-blanchable redness of a localized area usually over a bony prominence. Reddened areas on the hip bone. Blanchable and painful to touch (Active)  03/23/2023   Location: Ischial tuberosity  Location Orientation: Right  Staging: Stage 1 -  Intact skin with non-blanchable redness of a localized area usually over a bony prominence.  Wound Description (Comments): Reddened areas on the hip bone. Blanchable and painful to touch  Present on Admission: Yes  Dressing Type Foam - Lift dressing to assess site every shift 03/27/23 0800    BMI: Estimated body mass index is 26.38 kg/m as calculated from the following:   Height as of this encounter: 5\' 7"  (1.702 m).  Weight as of this encounter: 76.4 kg.   Code status:   Code Status: DNR   DVT Prophylaxis:Not needed-comfort care   Family Communication: Daughter-[name]-[phone] updated over the phone.   Disposition Plan: Status is: Inpatient Remains inpatient appropriate because: Severity of illness   Planned Discharge Destination: In-hospital death   Diet: Diet Order             Diet NPO time specified  Diet effective now                     Antimicrobial agents: Anti-infectives (From admission, onward)    Start     Dose/Rate Route Frequency Ordered Stop   03/21/2023 2200  cefTRIAXone (ROCEPHIN) 2 g in sodium chloride 0.9 % 100 mL IVPB  Status:  Discontinued        2 g 200 mL/hr over 30 Minutes Intravenous Every 24 hours 03/17/2023 1338 03/23/23 0805   02/27/2023 1430  azithromycin (ZITHROMAX) 500 mg in sodium chloride 0.9 % 250 mL IVPB  Status:  Discontinued        500 mg 250 mL/hr over 60 Minutes Intravenous Every 24 hours 03/19/2023 1338 03/23/23 0805   03/03/2023 0815  vancomycin (VANCOREADY) IVPB 1250 mg/250 mL  Status:  Discontinued        1,250 mg 166.7 mL/hr over 90 Minutes Intravenous Every 48 hours 02/27/2023 0715 03/10/2023 1335   03/16/2023 0715  piperacillin-tazobactam (ZOSYN) IVPB 3.375 g        3.375 g 12.5 mL/hr over 240 Minutes Intravenous Every 8 hours 03/03/2023 0713 03/15/2023 1530   03/06/2023 0415  cefTRIAXone (ROCEPHIN) 1 g in sodium chloride 0.9 % 100 mL IVPB  Status:  Discontinued        1 g 200 mL/hr over 30 Minutes Intravenous Every 24 hours 02/27/2023 0414 03/17/2023 0655        MEDICATIONS: Scheduled Meds:   HYDROmorphone (DILAUDID) injection  0.5 mg Intravenous Q6H   sodium chloride flush  3 mL Intravenous Q12H   Continuous Infusions:  sodium chloride     PRN Meds:.sodium chloride, acetaminophen **OR** acetaminophen, albuterol, glycopyrrolate **OR** glycopyrrolate **OR** glycopyrrolate, haloperidol **OR** haloperidol **OR** haloperidol lactate, HYDROmorphone  (DILAUDID) injection, LORazepam **OR** LORazepam **OR** LORazepam, ondansetron **OR** ondansetron (ZOFRAN) IV, polyvinyl alcohol, sodium chloride flush   I have personally reviewed following labs and imaging studies  LABORATORY DATA: CBC: Recent Labs  Lab 03/23/2023 0038 03/20/2023 0858 03/23/23 0600  WBC 8.0 7.4 21.8*  NEUTROABS 6.6 5.9  --   HGB 11.0* 10.9* 11.0*  HCT 35.4* 34.7* 33.9*  MCV 99.4 98.0 96.3  PLT 134* 135* 111*    Basic Metabolic Panel: Recent Labs  Lab 03/06/2023 0142 03/08/2023 0858 03/06/2023 1630 03/23/23 0600  NA 151* 152* 152* 152*  K 5.1 5.1 4.8 5.6*  CL 119* 120* 120* 116*  CO2 24 23 22  20*  GLUCOSE 98 100* 95 199*  BUN 59* 59* 61* 70*  CREATININE 2.23* 2.13* 2.18* 2.85*  CALCIUM 8.6* 8.4* 8.4* 8.1*  MG  --   --   --  2.5*    GFR: Estimated Creatinine Clearance: 15.8 mL/min (A) (by C-G formula based on SCr of 2.85 mg/dL (H)).  Liver Function Tests: Recent Labs  Lab 03/04/2023 0142 03/23/23 0600  AST 37 44*  ALT 54* 45*  ALKPHOS 112 104  BILITOT 0.5 0.2*  PROT 7.1 6.5  ALBUMIN 2.7* 2.4*   No results for input(s): "LIPASE", "AMYLASE" in the last 168 hours. No results for input(s): "  AMMONIA" in the last 168 hours.  Coagulation Profile: No results for input(s): "INR", "PROTIME" in the last 168 hours.  Cardiac Enzymes: No results for input(s): "CKTOTAL", "CKMB", "CKMBINDEX", "TROPONINI" in the last 168 hours.  BNP (last 3 results) No results for input(s): "PROBNP" in the last 8760 hours.  Lipid Profile: No results for input(s): "CHOL", "HDL", "LDLCALC", "TRIG", "CHOLHDL", "LDLDIRECT" in the last 72 hours.  Thyroid Function Tests: No results for input(s): "TSH", "T4TOTAL", "FREET4", "T3FREE", "THYROIDAB" in the last 72 hours.  Anemia Panel: No results for input(s): "VITAMINB12", "FOLATE", "FERRITIN", "TIBC", "IRON", "RETICCTPCT" in the last 72 hours.  Urine analysis:    Component Value Date/Time   COLORURINE YELLOW 03/18/2023 0038    APPEARANCEUR HAZY (A) 03/12/2023 0038   LABSPEC 1.020 03/08/2023 0038   PHURINE 5.0 02/24/2023 0038   GLUCOSEU NEGATIVE 03/17/2023 0038   HGBUR NEGATIVE 03/01/2023 0038   BILIRUBINUR NEGATIVE 03/08/2023 0038   KETONESUR NEGATIVE 03/17/2023 0038   PROTEINUR NEGATIVE 02/24/2023 0038   UROBILINOGEN 0.2 01/06/2012 1335   NITRITE NEGATIVE 03/18/2023 0038   LEUKOCYTESUR NEGATIVE 03/19/2023 0038    Sepsis Labs: Lactic Acid, Venous No results found for: "LATICACIDVEN"  MICROBIOLOGY: Recent Results (from the past 240 hour(s))  MRSA Next Gen by PCR, Nasal     Status: None   Collection Time: 03/06/2023  5:09 AM   Specimen: Nasal Mucosa; Nasal Swab  Result Value Ref Range Status   MRSA by PCR Next Gen NOT DETECTED NOT DETECTED Final    Comment: (NOTE) The GeneXpert MRSA Assay (FDA approved for NASAL specimens only), is one component of a comprehensive MRSA colonization surveillance program. It is not intended to diagnose MRSA infection nor to guide or monitor treatment for MRSA infections. Test performance is not FDA approved in patients less than 30 years old. Performed at [facility], [address], [place], [place] [address]   Culture, blood (Routine X 2) w Reflex to ID Panel     Status: None   Collection Time: 03/21/2023  8:58 AM   Specimen: BLOOD LEFT HAND  Result Value Ref Range Status   Specimen Description BLOOD LEFT HAND  Final   Special Requests   Final    BOTTLES DRAWN AEROBIC ONLY Blood Culture results may not be optimal due to an inadequate volume of blood received in culture bottles   Culture   Final    NO GROWTH 5 DAYS Performed at [facility], [address] [place], [place] [address]    Report Status 03/27/2023 FINAL  Final  Culture, blood (Routine X 2) w Reflex to ID Panel     Status: None   Collection Time: 03/06/2023  8:58 AM   Specimen: BLOOD LEFT HAND  Result Value Ref Range Status   Specimen Description BLOOD LEFT HAND  Final   Special Requests    Final    BOTTLES DRAWN AEROBIC ONLY Blood Culture results may not be optimal due to an inadequate volume of blood received in culture bottles   Culture   Final    NO GROWTH 5 DAYS Performed at [facility], [address], [place], [place] [address]    Report Status 03/27/2023 FINAL  Final    RADIOLOGY STUDIES/RESULTS: No results found.   LOS: 5 days   Signature  -    Susa Raring M.D on 03/27/2023 at 8:53 AM   -  To page go to [email or web address]

## 2023-03-28 DIAGNOSIS — N179 Acute kidney failure, unspecified: Secondary | ICD-10-CM | POA: Diagnosis not present

## 2023-04-25 NOTE — Plan of Care (Signed)
  Problem: Education: Goal: Knowledge of General Education information will improve Description: Including pain rating scale, medication(s)/side effects and non-pharmacologic comfort measures Outcome: Completed/Met   Problem: Health Behavior/Discharge Planning: Goal: Ability to manage health-related needs will improve Outcome: Completed/Met   Problem: Clinical Measurements: Goal: Ability to maintain clinical measurements within normal limits will improve Outcome: Completed/Met Goal: Will remain free from infection Outcome: Completed/Met Goal: Diagnostic test results will improve Outcome: Completed/Met Goal: Respiratory complications will improve Outcome: Completed/Met Goal: Cardiovascular complication will be avoided Outcome: Completed/Met   Problem: Activity: Goal: Risk for activity intolerance will decrease Outcome: Completed/Met   Problem: Nutrition: Goal: Adequate nutrition will be maintained Outcome: Completed/Met   Problem: Coping: Goal: Level of anxiety will decrease Outcome: Completed/Met   Problem: Elimination: Goal: Will not experience complications related to bowel motility Outcome: Completed/Met Goal: Will not experience complications related to urinary retention Outcome: Completed/Met   Problem: Pain Managment: Goal: General experience of comfort will improve Outcome: Completed/Met   Problem: Safety: Goal: Ability to remain free from injury will improve Outcome: Completed/Met   Problem: Skin Integrity: Goal: Risk for impaired skin integrity will decrease Outcome: Completed/Met   Problem: Education: Goal: Knowledge of the prescribed therapeutic regimen will improve Outcome: Completed/Met   Problem: Coping: Goal: Ability to identify and develop effective coping behavior will improve Outcome: Completed/Met   Problem: Clinical Measurements: Goal: Quality of life will improve Outcome: Completed/Met   Problem: Respiratory: Goal: Verbalizations of  increased ease of respirations will increase Outcome: Completed/Met   Problem: Role Relationship: Goal: Family's ability to cope with current situation will improve Outcome: Completed/Met Goal: Ability to verbalize concerns, feelings, and thoughts to partner or family member will improve Outcome: Completed/Met   Problem: Pain Management: Goal: Satisfaction with pain management regimen will improve Outcome: Completed/Met   

## 2023-04-25 NOTE — Death Summary Note (Signed)
[facility] Death Note                                                                                                                                                                                               Shane Juarez, is a 89 y.o. male, DOB - Dec 05, 1929, WUJ:[id number]  Admit date - 04-15-2023   Admitting Physician Gery Pray, MD  Outpatient Primary MD for the patient is No primary care provider on file.  LOS - 6  Chief Complaint  Patient presents with   Altered Mental Status       Notification: No primary care provider on file. notified of death of April 21, 2023   Admit Date:  04-15-2023   Date and Time of Death - 2023-04-21 at 10:10 AM.  Pronounced by - RN  History of present illness:   Shane MELLER Sr. is a 89 y.o. male with a history of - SNF-advanced dementia-mostly bed to wheelchair bound-presented with acute metabolic encephalopathy in the setting of aspiration pneumonia, AKI and hypernatremia.,  Developed aspiration pneumonia and was finally transition to comfort measures.  Passed away peacefully on 04-21-2023.   Final Diagnoses:  Cause if death - Aspiration PNA  Signature  -    Susa Raring M.D on 04-21-2023 at 10:13 AM   -  To page go to [email or web address]   Total clinical and documentation time for today Under 30 minutes   Last Note                           PROGRESS NOTE        PATIENT DETAILS Name: Shane PLESE Sr. Age: 89 y.o. Sex: male Date of Birth: [birth year] Admit Date: 15-Apr-2023 Admitting Physician Gery Pray, MD PCP:No primary care provider on file.  Brief Summary: Patient is a 89 y.o.  male-resident of SNF-advanced dementia-mostly bed to wheelchair bound-presented with acute metabolic encephalopathy in the setting of aspiration pneumonia, AKI and hypernatremia.  Significant events: 6/28>> admit to [facility] 6/29>> remains obtunded/hypothermic-worsening  AKI-persistently hyponatremic-transition to full comfort measures.  Significant studies: 6/28>> CT head: No acute abnormality 6/28>> patchy left basilar infiltrate  Significant microbiology data: 6/28>> blood culture: Negative  Procedures: None  Consults: None  Subjective: Patient in bed appears to be in no discomfort but unresponsive  Objective: Vitals: Blood pressure 121/64, pulse (!) 115, temperature 98.6 F (37 C), temperature source Axillary, resp. rate (!) 27, height 5\' 7"  (1.702 m), weight 76.4 kg, SpO2 (!) 68 %.   Exam:  Gen Exam: Unresponsive HEENT:atraumatic, normocephalic Chest: Some transmitted upper airway sounds CVS:S1S2 regular Abdomen:soft  non tender, non distended Extremities:no edema Neurology: Difficult exam-withdraws lower extremity to pain Skin: no rash    Assessment/Plan:  Note patient has been now transition to full comfort measures, expect hospital death, DNR.  All non comfort medications have been discontinued, other medical issues addressed earlier this admission are below.   Severe sepsis due to aspiration pneumonia with AKI/metabolic encephalopathy  Deteriorating in spite of being on IV fluids/IV antibiotics-persistently hypothermic this morning-remains essentially unresponsive and accumulating secretions After speaking with daughter-transition to full comfort measures-stopping IVF/antibiotics.  AKI on CKD stage IIIa Hypernatremia Secondary to dehydration in the setting of sepsis/poor oral intake Comfort care-stopping IV fluids  Acute metabolic encephalopathy Secondary to AKI/hyper natremia Comfort care-stopping IV fluids No plans to check/follow electrolytes.  History of CVA  History of dementia with behavioral disturbance SNF resident-poor functional baseline-mostly bedbound and dependent on nursing staff for ADLs  Palliative care DNR in place Now on full comfort measures, expect hospital death in the next few  days  Pressure Ulcer: Pressure Injury 03/18/2023 Ischial tuberosity Left;Right Stage 1 -  Intact skin with non-blanchable redness of a localized area usually over a bony prominence. Redened,blachable areas on bilateral hip bone,painful to touch (Active)  03/20/2023 0510  Location: Ischial tuberosity  Location Orientation: Left;Right  Staging: Stage 1 -  Intact skin with non-blanchable redness of a localized area usually over a bony prominence.  Wound Description (Comments): Redened,blachable areas on bilateral hip bone,painful to touch  Present on Admission: Yes  Dressing Type Foam - Lift dressing to assess site every shift 03/27/23 0800     Pressure Injury 02/27/2023 Ischial tuberosity Right Stage 1 -  Intact skin with non-blanchable redness of a localized area usually over a bony prominence. Reddened areas on the hip bone. Blanchable and painful to touch (Active)  03/14/2023   Location: Ischial tuberosity  Location Orientation: Right  Staging: Stage 1 -  Intact skin with non-blanchable redness of a localized area usually over a bony prominence.  Wound Description (Comments): Reddened areas on the hip bone. Blanchable and painful to touch  Present on Admission: Yes  Dressing Type Foam - Lift dressing to assess site every shift 03/27/23 0800    BMI: Estimated body mass index is 26.38 kg/m as calculated from the following:   Height as of this encounter: 5\' 7"  (1.702 m).   Weight as of this encounter: 76.4 kg.   Code status:   Code Status: DNR   DVT Prophylaxis:Not needed-comfort care   Family Communication: Daughter-[name] updated over the phone.   Disposition Plan: Status is: Inpatient Remains inpatient appropriate because: Severity of illness   Planned Discharge Destination: In-hospital death   Diet: Diet Order             Diet NPO time specified  Diet effective now                     Antimicrobial agents: Anti-infectives (From admission, onward)     Start     Dose/Rate Route Frequency Ordered Stop   03/10/2023 2200  cefTRIAXone (ROCEPHIN) 2 g in sodium chloride 0.9 % 100 mL IVPB  Status:  Discontinued        2 g 200 mL/hr over 30 Minutes Intravenous Every 24 hours 03/16/2023 1338 03/23/23 0805   02/23/2023 1430  azithromycin (ZITHROMAX) 500 mg in sodium chloride 0.9 % 250 mL IVPB  Status:  Discontinued        500 mg 250 mL/hr over 60 Minutes Intravenous Every  24 hours 02/26/2023 1338 03/23/23 0805   03/13/2023 0815  vancomycin (VANCOREADY) IVPB 1250 mg/250 mL  Status:  Discontinued        1,250 mg 166.7 mL/hr over 90 Minutes Intravenous Every 48 hours 03/24/2023 0715 02/25/2023 1335   03/24/2023 0715  piperacillin-tazobactam (ZOSYN) IVPB 3.375 g        3.375 g 12.5 mL/hr over 240 Minutes Intravenous Every 8 hours 03/13/2023 0713 02/28/2023 1530   03/11/2023 0415  cefTRIAXone (ROCEPHIN) 1 g in sodium chloride 0.9 % 100 mL IVPB  Status:  Discontinued        1 g 200 mL/hr over 30 Minutes Intravenous Every 24 hours 03/15/2023 0414 03/08/2023 0655        MEDICATIONS: Scheduled Meds:   HYDROmorphone (DILAUDID) injection  0.5 mg Intravenous Q6H   sodium chloride flush  3 mL Intravenous Q12H   Continuous Infusions:  sodium chloride     PRN Meds:.sodium chloride, acetaminophen **OR** acetaminophen, albuterol, antiseptic oral rinse, glycopyrrolate **OR** glycopyrrolate **OR** glycopyrrolate, haloperidol **OR** haloperidol **OR** haloperidol lactate, HYDROmorphone (DILAUDID) injection, LORazepam **OR** LORazepam **OR** LORazepam, ondansetron **OR** ondansetron (ZOFRAN) IV, polyvinyl alcohol, sodium chloride flush   I have personally reviewed following labs and imaging studies  LABORATORY DATA: CBC: Recent Labs  Lab 03/19/2023 0038 03/09/2023 0858 03/23/23 0600  WBC 8.0 7.4 21.8*  NEUTROABS 6.6 5.9  --   HGB 11.0* 10.9* 11.0*  HCT 35.4* 34.7* 33.9*  MCV 99.4 98.0 96.3  PLT 134* 135* 111*    Basic Metabolic Panel: Recent Labs  Lab 03/06/2023 0142  02/28/2023 0858 03/02/2023 1630 03/23/23 0600  NA 151* 152* 152* 152*  K 5.1 5.1 4.8 5.6*  CL 119* 120* 120* 116*  CO2 24 23 22  20*  GLUCOSE 98 100* 95 199*  BUN 59* 59* 61* 70*  CREATININE 2.23* 2.13* 2.18* 2.85*  CALCIUM 8.6* 8.4* 8.4* 8.1*  MG  --   --   --  2.5*    GFR: Estimated Creatinine Clearance: 15.8 mL/min (A) (by C-G formula based on SCr of 2.85 mg/dL (H)).  Liver Function Tests: Recent Labs  Lab 03/13/2023 0142 03/23/23 0600  AST 37 44*  ALT 54* 45*  ALKPHOS 112 104  BILITOT 0.5 0.2*  PROT 7.1 6.5  ALBUMIN 2.7* 2.4*   No results for input(s): "LIPASE", "AMYLASE" in the last 168 hours. No results for input(s): "AMMONIA" in the last 168 hours.  Coagulation Profile: No results for input(s): "INR", "PROTIME" in the last 168 hours.  Cardiac Enzymes: No results for input(s): "CKTOTAL", "CKMB", "CKMBINDEX", "TROPONINI" in the last 168 hours.  BNP (last 3 results) No results for input(s): "PROBNP" in the last 8760 hours.  Lipid Profile: No results for input(s): "CHOL", "HDL", "LDLCALC", "TRIG", "CHOLHDL", "LDLDIRECT" in the last 72 hours.  Thyroid Function Tests: No results for input(s): "TSH", "T4TOTAL", "FREET4", "T3FREE", "THYROIDAB" in the last 72 hours.  Anemia Panel: No results for input(s): "VITAMINB12", "FOLATE", "FERRITIN", "TIBC", "IRON", "RETICCTPCT" in the last 72 hours.  Urine analysis:    Component Value Date/Time   COLORURINE YELLOW 03/07/2023 0038   APPEARANCEUR HAZY (A) 02/28/2023 0038   LABSPEC 1.020 03/03/2023 0038   PHURINE 5.0 03/01/2023 0038   GLUCOSEU NEGATIVE 03/21/2023 0038   HGBUR NEGATIVE 03/20/2023 0038   BILIRUBINUR NEGATIVE 03/21/2023 0038   KETONESUR NEGATIVE 02/26/2023 0038   PROTEINUR NEGATIVE 03/11/2023 0038   UROBILINOGEN 0.2 01/06/2012 1335   NITRITE NEGATIVE 03/03/2023 0038   LEUKOCYTESUR NEGATIVE 03/18/2023 0038    Sepsis Labs: Lactic  Acid, Venous No results found for: "LATICACIDVEN"  MICROBIOLOGY: Recent  Results (from the past 240 hour(s))  MRSA Next Gen by PCR, Nasal     Status: None   Collection Time: 03/21/2023  5:09 AM   Specimen: Nasal Mucosa; Nasal Swab  Result Value Ref Range Status   MRSA by PCR Next Gen NOT DETECTED NOT DETECTED Final    Comment: (NOTE) The GeneXpert MRSA Assay (FDA approved for NASAL specimens only), is one component of a comprehensive MRSA colonization surveillance program. It is not intended to diagnose MRSA infection nor to guide or monitor treatment for MRSA infections. Test performance is not FDA approved in patients less than 69 years old. Performed at [facility], [address], [place], [place] [address]   Culture, blood (Routine X 2) w Reflex to ID Panel     Status: None   Collection Time: 03/19/2023  8:58 AM   Specimen: BLOOD LEFT HAND  Result Value Ref Range Status   Specimen Description BLOOD LEFT HAND  Final   Special Requests   Final    BOTTLES DRAWN AEROBIC ONLY Blood Culture results may not be optimal due to an inadequate volume of blood received in culture bottles   Culture   Final    NO GROWTH 5 DAYS Performed at [facility], [address] [place], [place] [address]    Report Status 03/27/2023 FINAL  Final  Culture, blood (Routine X 2) w Reflex to ID Panel     Status: None   Collection Time: 02/26/2023  8:58 AM   Specimen: BLOOD LEFT HAND  Result Value Ref Range Status   Specimen Description BLOOD LEFT HAND  Final   Special Requests   Final    BOTTLES DRAWN AEROBIC ONLY Blood Culture results may not be optimal due to an inadequate volume of blood received in culture bottles   Culture   Final    NO GROWTH 5 DAYS Performed at [facility], [address] [place], [place] [address]    Report Status 03/27/2023 FINAL  Final    RADIOLOGY STUDIES/RESULTS: No results found.   LOS: 6 days   Signature  -    Susa Raring M.D on 04/23/2023 at 10:13 AM   -  To page go to [email or web address]

## 2023-04-25 NOTE — Progress Notes (Signed)
PROGRESS NOTE        PATIENT DETAILS Name: Shane DUNAWAY Sr. Age: 89 y.o. Sex: male Date of Birth: 28-May-1930 Admit Date: 03/17/2023 Admitting Physician Gery Pray, MD PCP:No primary care provider on file.  Brief Summary: Patient is a 89 y.o.  male-resident of SNF-advanced dementia-mostly bed to wheelchair bound-presented with acute metabolic encephalopathy in the setting of aspiration pneumonia, AKI and hypernatremia.  Significant events: 6/28>> admit to [facility] 6/29>> remains obtunded/hypothermic-worsening AKI-persistently hyponatremic-transition to full comfort measures.  Significant studies: 6/28>> CT head: No acute abnormality 6/28>> patchy left basilar infiltrate  Significant microbiology data: 6/28>> blood culture: Negative  Procedures: None  Consults: None  Subjective: Patient in bed appears to be in no discomfort but unresponsive  Objective: Vitals: Blood pressure 111/73, pulse 95, temperature 98.6 F (37 C), temperature source Axillary, resp. rate 18, height 5\' 7"  (1.702 m), weight 76.4 kg, SpO2 (!) 89 %.   Exam:  Gen Exam: Unresponsive HEENT:atraumatic, normocephalic Chest: Some transmitted upper airway sounds CVS:S1S2 regular Abdomen:soft non tender, non distended Extremities:no edema Neurology: Difficult exam-withdraws lower extremity to pain Skin: no rash    Assessment/Plan:  Note patient has been now transition to full comfort measures, expect hospital death, DNR.  All non comfort medications have been discontinued, other medical issues addressed earlier this admission are below.   Severe sepsis due to aspiration pneumonia with AKI/metabolic encephalopathy  Deteriorating in spite of being on IV fluids/IV antibiotics-persistently hypothermic this morning-remains essentially unresponsive and accumulating secretions After speaking with daughter-transition to full comfort measures-stopping IVF/antibiotics.  AKI on CKD  stage IIIa Hypernatremia Secondary to dehydration in the setting of sepsis/poor oral intake Comfort care-stopping IV fluids  Acute metabolic encephalopathy Secondary to AKI/hyper natremia Comfort care-stopping IV fluids No plans to check/follow electrolytes.  History of CVA  History of dementia with behavioral disturbance SNF resident-poor functional baseline-mostly bedbound and dependent on nursing staff for ADLs  Palliative care DNR in place Now on full comfort measures, expect hospital death in the next few days  Pressure Ulcer: Pressure Injury 02/28/2023 Ischial tuberosity Left;Right Stage 1 -  Intact skin with non-blanchable redness of a localized area usually over a bony prominence. Redened,blachable areas on bilateral hip bone,painful to touch (Active)  03/10/2023 0510  Location: Ischial tuberosity  Location Orientation: Left;Right  Staging: Stage 1 -  Intact skin with non-blanchable redness of a localized area usually over a bony prominence.  Wound Description (Comments): Redened,blachable areas on bilateral hip bone,painful to touch  Present on Admission: Yes  Dressing Type Foam - Lift dressing to assess site every shift 03/27/23 0800     Pressure Injury 02/27/2023 Ischial tuberosity Right Stage 1 -  Intact skin with non-blanchable redness of a localized area usually over a bony prominence. Reddened areas on the hip bone. Blanchable and painful to touch (Active)  03/13/2023   Location: Ischial tuberosity  Location Orientation: Right  Staging: Stage 1 -  Intact skin with non-blanchable redness of a localized area usually over a bony prominence.  Wound Description (Comments): Reddened areas on the hip bone. Blanchable and painful to touch  Present on Admission: Yes  Dressing Type Foam - Lift dressing to assess site every shift 03/27/23 0800    BMI: Estimated body mass index is 26.38 kg/m as calculated from the following:   Height as of this encounter: 5\' 7"  (1.702 m).  Weight as of this encounter: 76.4 kg.   Code status:   Code Status: DNR   DVT Prophylaxis:Not needed-comfort care   Family Communication: Daughter-[name] updated over the phone.   Disposition Plan: Status is: Inpatient Remains inpatient appropriate because: Severity of illness   Planned Discharge Destination: In-hospital death   Diet: Diet Order             Diet NPO time specified  Diet effective now                     Antimicrobial agents: Anti-infectives (From admission, onward)    Start     Dose/Rate Route Frequency Ordered Stop   03/08/2023 2200  cefTRIAXone (ROCEPHIN) 2 g in sodium chloride 0.9 % 100 mL IVPB  Status:  Discontinued        2 g 200 mL/hr over 30 Minutes Intravenous Every 24 hours 03/10/2023 1338 03/23/23 0805   03/06/2023 1430  azithromycin (ZITHROMAX) 500 mg in sodium chloride 0.9 % 250 mL IVPB  Status:  Discontinued        500 mg 250 mL/hr over 60 Minutes Intravenous Every 24 hours 03/13/2023 1338 03/23/23 0805   03/02/2023 0815  vancomycin (VANCOREADY) IVPB 1250 mg/250 mL  Status:  Discontinued        1,250 mg 166.7 mL/hr over 90 Minutes Intravenous Every 48 hours 02/26/2023 0715 03/11/2023 1335   03/16/2023 0715  piperacillin-tazobactam (ZOSYN) IVPB 3.375 g        3.375 g 12.5 mL/hr over 240 Minutes Intravenous Every 8 hours 03/04/2023 0713 03/23/2023 1530   03/13/2023 0415  cefTRIAXone (ROCEPHIN) 1 g in sodium chloride 0.9 % 100 mL IVPB  Status:  Discontinued        1 g 200 mL/hr over 30 Minutes Intravenous Every 24 hours 03/08/2023 0414 02/27/2023 0655        MEDICATIONS: Scheduled Meds:   HYDROmorphone (DILAUDID) injection  0.5 mg Intravenous Q6H   sodium chloride flush  3 mL Intravenous Q12H   Continuous Infusions:  sodium chloride     PRN Meds:.sodium chloride, acetaminophen **OR** acetaminophen, albuterol, antiseptic oral rinse, glycopyrrolate **OR** glycopyrrolate **OR** glycopyrrolate, haloperidol **OR** haloperidol **OR** haloperidol  lactate, HYDROmorphone (DILAUDID) injection, LORazepam **OR** LORazepam **OR** LORazepam, ondansetron **OR** ondansetron (ZOFRAN) IV, polyvinyl alcohol, sodium chloride flush   I have personally reviewed following labs and imaging studies  LABORATORY DATA: CBC: Recent Labs  Lab 03/21/2023 0038 03/14/2023 0858 03/23/23 0600  WBC 8.0 7.4 21.8*  NEUTROABS 6.6 5.9  --   HGB 11.0* 10.9* 11.0*  HCT 35.4* 34.7* 33.9*  MCV 99.4 98.0 96.3  PLT 134* 135* 111*    Basic Metabolic Panel: Recent Labs  Lab 02/23/2023 0142 03/21/2023 0858 03/04/2023 1630 03/23/23 0600  NA 151* 152* 152* 152*  K 5.1 5.1 4.8 5.6*  CL 119* 120* 120* 116*  CO2 24 23 22  20*  GLUCOSE 98 100* 95 199*  BUN 59* 59* 61* 70*  CREATININE 2.23* 2.13* 2.18* 2.85*  CALCIUM 8.6* 8.4* 8.4* 8.1*  MG  --   --   --  2.5*    GFR: Estimated Creatinine Clearance: 15.8 mL/min (A) (by C-G formula based on SCr of 2.85 mg/dL (H)).  Liver Function Tests: Recent Labs  Lab 03/01/2023 0142 03/23/23 0600  AST 37 44*  ALT 54* 45*  ALKPHOS 112 104  BILITOT 0.5 0.2*  PROT 7.1 6.5  ALBUMIN 2.7* 2.4*   No results for input(s): "LIPASE", "AMYLASE" in the last 168 hours. No  results for input(s): "AMMONIA" in the last 168 hours.  Coagulation Profile: No results for input(s): "INR", "PROTIME" in the last 168 hours.  Cardiac Enzymes: No results for input(s): "CKTOTAL", "CKMB", "CKMBINDEX", "TROPONINI" in the last 168 hours.  BNP (last 3 results) No results for input(s): "PROBNP" in the last 8760 hours.  Lipid Profile: No results for input(s): "CHOL", "HDL", "LDLCALC", "TRIG", "CHOLHDL", "LDLDIRECT" in the last 72 hours.  Thyroid Function Tests: No results for input(s): "TSH", "T4TOTAL", "FREET4", "T3FREE", "THYROIDAB" in the last 72 hours.  Anemia Panel: No results for input(s): "VITAMINB12", "FOLATE", "FERRITIN", "TIBC", "IRON", "RETICCTPCT" in the last 72 hours.  Urine analysis:    Component Value Date/Time   COLORURINE  YELLOW 02/27/2023 0038   APPEARANCEUR HAZY (A) 02/27/2023 0038   LABSPEC 1.020 03/20/2023 0038   PHURINE 5.0 03/21/2023 0038   GLUCOSEU NEGATIVE 03/02/2023 0038   HGBUR NEGATIVE 03/21/2023 0038   BILIRUBINUR NEGATIVE 03/12/2023 0038   KETONESUR NEGATIVE 03/10/2023 0038   PROTEINUR NEGATIVE 03/11/2023 0038   UROBILINOGEN 0.2 01/06/2012 1335   NITRITE NEGATIVE 03/14/2023 0038   LEUKOCYTESUR NEGATIVE 03/03/2023 0038    Sepsis Labs: Lactic Acid, Venous No results found for: "LATICACIDVEN"  MICROBIOLOGY: Recent Results (from the past 240 hour(s))  MRSA Next Gen by PCR, Nasal     Status: None   Collection Time: 03/10/2023  5:09 AM   Specimen: Nasal Mucosa; Nasal Swab  Result Value Ref Range Status   MRSA by PCR Next Gen NOT DETECTED NOT DETECTED Final    Comment: (NOTE) The GeneXpert MRSA Assay (FDA approved for NASAL specimens only), is one component of a comprehensive MRSA colonization surveillance program. It is not intended to diagnose MRSA infection nor to guide or monitor treatment for MRSA infections. Test performance is not FDA approved in patients less than 89 years old. Performed at [facility], [address] [place], [place] [address]   Culture, blood (Routine X 2) w Reflex to ID Panel     Status: None   Collection Time: 03/03/2023  8:58 AM   Specimen: BLOOD LEFT HAND  Result Value Ref Range Status   Specimen Description BLOOD LEFT HAND  Final   Special Requests   Final    BOTTLES DRAWN AEROBIC ONLY Blood Culture results may not be optimal due to an inadequate volume of blood received in culture bottles   Culture   Final    NO GROWTH 5 DAYS Performed at [facility], [address] [place], [place] [address]    Report Status 03/27/2023 FINAL  Final  Culture, blood (Routine X 2) w Reflex to ID Panel     Status: None   Collection Time: 02/25/2023  8:58 AM   Specimen: BLOOD LEFT HAND  Result Value Ref Range Status   Specimen Description BLOOD LEFT HAND   Final   Special Requests   Final    BOTTLES DRAWN AEROBIC ONLY Blood Culture results may not be optimal due to an inadequate volume of blood received in culture bottles   Culture   Final    NO GROWTH 5 DAYS Performed at [facility], [address], [place], [place] [address]    Report Status 03/27/2023 FINAL  Final    RADIOLOGY STUDIES/RESULTS: No results found.   LOS: 6 days   Signature  -    Susa Raring M.D on 04/18/2023 at 9:08 AM   -  To page go to [email or web address]

## 2023-04-25 NOTE — Progress Notes (Signed)
Telemetry notified this Clinical research associate, primary RN Yuriana Gaal, that pt may have died. This Clinical research associate went to bedside with 2nd RN verifier Nadine Counts, RN, and time of death pronounced at 1010. Pt's oldest son Tahje was at bedside when time of death confirmed. Attending physician Dr. Susa Raring, MD and charge RN Donalynn Furlong made aware. Condolences extended to pt's son Fortino on behalf of [facility].

## 2023-04-25 DEATH — deceased
